# Patient Record
Sex: Male | Born: 1935
Health system: Southern US, Community
[De-identification: ages and names within clinical notes are randomized; demographics above are authoritative.]

## PROBLEM LIST (undated history)

## (undated) DIAGNOSIS — I10 Essential (primary) hypertension: Secondary | ICD-10-CM

## (undated) DIAGNOSIS — F419 Anxiety disorder, unspecified: Secondary | ICD-10-CM

## (undated) DIAGNOSIS — E669 Obesity, unspecified: Secondary | ICD-10-CM

## (undated) DIAGNOSIS — C801 Malignant (primary) neoplasm, unspecified: Secondary | ICD-10-CM

## (undated) DIAGNOSIS — E785 Hyperlipidemia, unspecified: Secondary | ICD-10-CM

## (undated) DIAGNOSIS — R12 Heartburn: Secondary | ICD-10-CM

## (undated) DIAGNOSIS — N189 Chronic kidney disease, unspecified: Secondary | ICD-10-CM

## (undated) DIAGNOSIS — M1990 Unspecified osteoarthritis, unspecified site: Secondary | ICD-10-CM

## (undated) DIAGNOSIS — I639 Cerebral infarction, unspecified: Secondary | ICD-10-CM

## (undated) DIAGNOSIS — K219 Gastro-esophageal reflux disease without esophagitis: Secondary | ICD-10-CM

## (undated) DIAGNOSIS — K635 Polyp of colon: Secondary | ICD-10-CM

## (undated) DIAGNOSIS — E119 Type 2 diabetes mellitus without complications: Secondary | ICD-10-CM

## (undated) HISTORY — DX: Polyp of colon: K63.5

## (undated) HISTORY — DX: Obesity, unspecified: E66.9

## (undated) HISTORY — DX: Unspecified osteoarthritis, unspecified site: M19.90

## (undated) HISTORY — DX: Heartburn: R12

## (undated) HISTORY — DX: Hyperlipidemia, unspecified: E78.5

## (undated) HISTORY — PX: EYE SURGERY: SHX253

---

## 2004-05-14 ENCOUNTER — Emergency Department: Payer: Self-pay | Admitting: Emergency Medicine

## 2012-10-06 ENCOUNTER — Emergency Department: Payer: Self-pay | Admitting: Emergency Medicine

## 2014-06-26 ENCOUNTER — Emergency Department
Admission: EM | Admit: 2014-06-26 | Discharge: 2014-06-26 | Discharge status: Against Medical Advice | Payer: Medicare Other | Attending: Emergency Medicine | Admitting: Emergency Medicine

## 2014-06-26 ENCOUNTER — Encounter: Payer: Self-pay | Admitting: Emergency Medicine

## 2014-06-26 ENCOUNTER — Telehealth: Payer: Self-pay | Admitting: Emergency Medicine

## 2014-06-26 ENCOUNTER — Other Ambulatory Visit: Payer: Self-pay

## 2014-06-26 DIAGNOSIS — I10 Essential (primary) hypertension: Secondary | ICD-10-CM | POA: Insufficient documentation

## 2014-06-26 DIAGNOSIS — Z87891 Personal history of nicotine dependence: Secondary | ICD-10-CM | POA: Diagnosis not present

## 2014-06-26 DIAGNOSIS — E119 Type 2 diabetes mellitus without complications: Secondary | ICD-10-CM | POA: Diagnosis not present

## 2014-06-26 DIAGNOSIS — R0789 Other chest pain: Secondary | ICD-10-CM | POA: Diagnosis present

## 2014-06-26 HISTORY — DX: Essential (primary) hypertension: I10

## 2014-06-26 HISTORY — DX: Type 2 diabetes mellitus without complications: E11.9

## 2014-06-26 LAB — BASIC METABOLIC PANEL
Anion gap: 8 (ref 5–15)
BUN: 22 mg/dL — ABNORMAL HIGH (ref 6–20)
CO2: 25 mmol/L (ref 22–32)
Calcium: 9.3 mg/dL (ref 8.9–10.3)
Chloride: 104 mmol/L (ref 101–111)
Creatinine, Ser: 1.45 mg/dL — ABNORMAL HIGH (ref 0.61–1.24)
GFR calc Af Amer: 50 mL/min — ABNORMAL LOW (ref >60)
GFR calc non Af Amer: 43 mL/min — ABNORMAL LOW (ref >60)
Glucose, Bld: 291 mg/dL — ABNORMAL HIGH (ref 65–99)
Potassium: 4.7 mmol/L (ref 3.5–5.1)
Sodium: 137 mmol/L (ref 135–145)

## 2014-06-26 LAB — TROPONIN I: Troponin I: <0.03 ng/mL (ref <0.031)

## 2014-06-26 NOTE — ED Notes (Signed)
Called due to lwobs today.  Pt says he thought his blood sugar was down and went home to eat.  He also says he thinks the pain may be due to pulled muscle, as he was helpi9ng move furniture a couple days ago.  [facility]  i asked him to call his pcp and tell them he came to the ed with chest pain and the circumstances.

## 2014-06-26 NOTE — ED Notes (Signed)
Says stabbing left c hest pain that increases with deep breath for 2-3 days.

## 2015-06-08 LAB — CBC AND DIFFERENTIAL: Hemoglobin: 8 g/dL — AB (ref 13.5–17.5)

## 2015-07-03 ENCOUNTER — Encounter: Payer: Self-pay | Admitting: Family Medicine

## 2015-07-03 DIAGNOSIS — E1140 Type 2 diabetes mellitus with diabetic neuropathy, unspecified: Secondary | ICD-10-CM | POA: Insufficient documentation

## 2015-07-03 DIAGNOSIS — I129 Hypertensive chronic kidney disease with stage 1 through stage 4 chronic kidney disease, or unspecified chronic kidney disease: Secondary | ICD-10-CM | POA: Insufficient documentation

## 2015-07-03 DIAGNOSIS — I10 Essential (primary) hypertension: Secondary | ICD-10-CM | POA: Insufficient documentation

## 2015-07-03 DIAGNOSIS — K635 Polyp of colon: Secondary | ICD-10-CM | POA: Insufficient documentation

## 2015-07-03 DIAGNOSIS — N183 Chronic kidney disease, stage 3 unspecified: Secondary | ICD-10-CM | POA: Insufficient documentation

## 2015-07-03 DIAGNOSIS — M1990 Unspecified osteoarthritis, unspecified site: Secondary | ICD-10-CM | POA: Insufficient documentation

## 2015-07-03 DIAGNOSIS — E7800 Pure hypercholesterolemia, unspecified: Secondary | ICD-10-CM

## 2015-07-03 DIAGNOSIS — E138 Other specified diabetes mellitus with unspecified complications: Secondary | ICD-10-CM

## 2015-07-03 DIAGNOSIS — R12 Heartburn: Secondary | ICD-10-CM

## 2015-07-04 ENCOUNTER — Encounter: Payer: Self-pay | Admitting: Family Medicine

## 2015-07-04 ENCOUNTER — Ambulatory Visit (INDEPENDENT_AMBULATORY_CARE_PROVIDER_SITE_OTHER): Payer: Medicare Other | Admitting: Family Medicine

## 2015-07-04 VITALS — BP 138/64 | HR 78 | Temp 98.4°F | Resp 16 | Ht 69.5 in | Wt 188.0 lb

## 2015-07-04 DIAGNOSIS — Z794 Long term (current) use of insulin: Secondary | ICD-10-CM

## 2015-07-04 DIAGNOSIS — R12 Heartburn: Secondary | ICD-10-CM | POA: Diagnosis not present

## 2015-07-04 DIAGNOSIS — E1122 Type 2 diabetes mellitus with diabetic chronic kidney disease: Secondary | ICD-10-CM

## 2015-07-04 DIAGNOSIS — N182 Chronic kidney disease, stage 2 (mild): Secondary | ICD-10-CM

## 2015-07-04 DIAGNOSIS — R1011 Right upper quadrant pain: Secondary | ICD-10-CM | POA: Diagnosis not present

## 2015-07-04 DIAGNOSIS — I10 Essential (primary) hypertension: Secondary | ICD-10-CM | POA: Diagnosis not present

## 2015-07-04 MED ORDER — OMEPRAZOLE 20 MG PO CPDR: 20.0000 mg | DELAYED_RELEASE_CAPSULE | Freq: Every day | ORAL | Status: DC

## 2015-07-04 NOTE — Assessment & Plan Note (Signed)
Symptoms consistent with GERD vs gallbladder. Add omeprazole. Check CBC, CMET. Recheck 4 weeks.

## 2015-07-04 NOTE — Progress Notes (Signed)
Subjective:    Patient ID: Charles York, male    DOB: 10-26-1930, 82 y.o.   MRN: [id number]  HPI: Charles York is a 82 y.o. male presenting on 07/04/2015 for Establish Care   HPI  Pt presents to establish care today. Pt presents to establish care today. [facility] he would like a local provider for acute visits.  It has been 3 months since His last PCP visit. Records from previous provider will be requested and reviewed. Current medical problems include:  Diabetes: Managed by [place]. Last A1c was 8.0%. Walks 3 miles every morning. Drinks lots of diet stuff. Lantus dose is 28 units before breakfasts. Taking 12 before meals with Novolog twice daily. [facility] Foot exam done 2 weeks ago.  Hypertension: Diagnosed 5 years ago. Controlled. Taking losartan daily. Managed at [facility]. Checks BP at home. Calls in numbers at 7pm to [place]. Acid reflux- Taking ranitidine daily. Twice daily. Still having gas and bloating. No dysphagia or regurg. Some belching. Anything he eats causes gas. Never had endoscopy.  Has never been a PPI.  Health maintenance:  Getting colonoscopy 6/9  Concern about R side pain- 2 weeks reaching in kitchen sharp pain. Now dull ache on R side radiates to back. Pain is worse with meals. Some nausea. No fevers. Colicky pain. Certain movements cause more pain.   Widowed- wife passed away last year.  Past Medical History  Diagnosis Date  . Diabetes mellitus without complication ([place])   . Hypertension   . Arthritis   . Colon polyp   . Heartburn   . Hyperlipemia   . Obesity   History reviewed. No pertinent past surgical history.  Social History   Social History  . Marital Status: Married    Spouse Name: N/A  . Number of Children: N/A  . Years of Education: N/A   Occupational History  . Not on file.   Social History Main Topics  . Smoking status: Former Smoker    Quit date: 02/04/1983  . Smokeless tobacco: Not on file  . Alcohol Use:  No  . Drug Use: No  . Sexual Activity: Not on file   Other Topics Concern  . Not on file   Social History Narrative   Family History  Problem Relation Age of Onset  . Cancer Father     carcinoma  . Alzheimer's disease Mother    No current outpatient prescriptions on file prior to visit.   No current facility-administered medications on file prior to visit.    Review of Systems  Constitutional: Negative for fever and chills.  HENT: Negative.   Respiratory: Negative for chest tightness, shortness of breath and wheezing.   Cardiovascular: Negative for chest pain, palpitations and leg swelling.  Gastrointestinal: Positive for nausea, abdominal pain and abdominal distention. Negative for vomiting, constipation, blood in stool and rectal pain.  Endocrine: Negative.   Genitourinary: Negative for dysuria, urgency, discharge, penile pain and testicular pain.  Musculoskeletal: Negative for back pain, joint swelling and arthralgias.  Skin: Negative.   Neurological: Negative for dizziness, weakness, numbness and headaches.  Psychiatric/Behavioral: Negative for sleep disturbance and dysphoric mood.   Per HPI unless specifically indicated above     Objective:    BP 138/64 mmHg  Pulse 78  Temp(Src) 98.4 F (36.9 C) (Oral)  Resp 16  Ht 5' 9.5" (1.765 m)  Wt 188 lb (85.276 kg)  BMI 27.37 kg/m2  Wt Readings from Last  3 Encounters:  07/04/15 188 lb (85.276 kg)  06/26/14 190 lb (86.183 kg)    Physical Exam  Constitutional: He is oriented to person, place, and time. He appears well-developed and well-nourished. No distress.  HENT:  Head: Normocephalic and atraumatic.  Neck: Neck supple. No thyromegaly present.  Cardiovascular: Normal rate, regular rhythm and normal heart sounds.  Exam reveals no gallop and no friction rub.   No murmur heard. Pulmonary/Chest: Effort normal and breath sounds normal. He has no wheezes.  Abdominal: Soft. Normal appearance. He exhibits no shifting  dullness, no distension, no pulsatile liver, no fluid wave and no abdominal bruit. Bowel sounds are increased. There is no hepatosplenomegaly. There is tenderness in the right upper quadrant. There is positive Murphy's sign. There is no rigidity, no rebound, no guarding, no CVA tenderness and no tenderness at McBurney's point.  Musculoskeletal: Normal range of motion. He exhibits no edema or tenderness.  Neurological: He is alert and oriented to person, place, and time. He has normal reflexes.  Skin: Skin is warm and dry. No rash noted. No erythema.  Psychiatric: He has a normal mood and affect. His behavior is normal. Thought content normal.   Diabetic Foot Exam - Simple   Simple Foot Form  Diabetic Foot exam was performed with the following findings:  Yes 07/04/2015  9:40 AM  Visual Inspection  No deformities, no ulcerations, no other skin breakdown bilaterally:  Yes  Sensation Testing  Intact to touch and monofilament testing bilaterally:  Yes  Pulse Check  Posterior Tibialis and Dorsalis pulse intact bilaterally:  Yes  Comments      Results for orders placed or performed in visit on 07/04/15  CBC and differential  Result Value Ref Range   Hemoglobin 8.0 (A) 13.5 - 17.5 g/dL      Assessment & Plan:   Problem List Items Addressed This Visit      Cardiovascular and Mediastinum   High blood pressure    Managed by the [place]. Controlled in office.       Relevant Medications   losartan (COZAAR) 25 MG tablet   atorvastatin (LIPITOR) 80 MG tablet   aspirin 81 MG tablet     Endocrine   Diabetes ([place])    Managed by the [facility].       Relevant Medications   losartan (COZAAR) 25 MG tablet   atorvastatin (LIPITOR) 80 MG tablet   aspirin 81 MG tablet   insulin aspart (NOVOLOG) 100 UNIT/ML injection   insulin glargine (LANTUS) 100 UNIT/ML injection     Other   Heartburn    Symptoms consistent with GERD vs gallbladder. Add omeprazole. Check CBC, CMET. Recheck 4 weeks.       Relevant  Medications   omeprazole (PRILOSEC) 20 MG capsule   Other Relevant Orders   CBC with Differential/Platelet    Other Visit Diagnoses    Colicky RUQ abdominal pain    -  Primary    Concern for gallbladder disease. Check CMET, amylase, lipase. RUQ ultrasound ordered. Pt to ER for severe abdominal pain.     Relevant Orders    Amylase    Lipase    Comprehensive metabolic panel    US Abdomen Limited RUQ       Meds ordered this encounter  Medications  . losartan (COZAAR) 25 MG tablet    Sig: Take 25 mg by mouth daily.  Marland Kitchen atorvastatin (LIPITOR) 80 MG tablet    Sig: Take 80 mg by mouth at bedtime. Pt take  1/2 pill daily at bed time  . ranitidine (ZANTAC) 150 MG tablet    Sig: Take 150 mg by mouth 2 (two) times daily.  Marland Kitchen aspirin 81 MG tablet    Sig: Take 81 mg by mouth daily.  . vitamin B-12 (CYANOCOBALAMIN) 1000 MCG tablet    Sig: Take 1,000 mcg by mouth daily.  . insulin aspart (NOVOLOG) 100 UNIT/ML injection    Sig: Inject into the skin 3 (three) times daily before meals. Pt takes 12 units before breakfast and before supper.  . insulin glargine (LANTUS) 100 UNIT/ML injection    Sig: Inject into the skin daily. Pt takes 28 units once daily  . omeprazole (PRILOSEC) 20 MG capsule    Sig: Take 1 capsule (20 mg total) by mouth daily.    Dispense:  30 capsule    Refill:  3    Order Specific Question:  Supervising Provider    Answer:  Arlis Porta [id number]      Follow up plan: Return in about 4 weeks (around 08/01/2015) for belly pain.

## 2015-07-04 NOTE — Patient Instructions (Signed)
I think your pain could be your gallbladder. We will check some lab work to ensure everything is going okay. We will also get an ultrasound to see if there are gallstones.   Start taking omeprazole once daily in the morning on an empty stomach.

## 2015-07-04 NOTE — Assessment & Plan Note (Signed)
[facility] 

## 2015-07-04 NOTE — Assessment & Plan Note (Addendum)
Managed by the [place]. Controlled in office.

## 2015-07-05 ENCOUNTER — Other Ambulatory Visit: Payer: Self-pay | Admitting: Family Medicine

## 2015-07-05 ENCOUNTER — Other Ambulatory Visit: Payer: Medicare Other

## 2015-07-05 DIAGNOSIS — R1011 Right upper quadrant pain: Secondary | ICD-10-CM | POA: Diagnosis not present

## 2015-07-05 DIAGNOSIS — R12 Heartburn: Secondary | ICD-10-CM | POA: Diagnosis not present

## 2015-07-05 LAB — CBC WITH DIFFERENTIAL/PLATELET
Basophils Absolute: 87 cells/uL (ref 0–200)
Basophils Relative: 1 %
Eosinophils Absolute: 261 cells/uL (ref 15–500)
Eosinophils Relative: 3 %
HCT: 39.9 % (ref 38.5–50.0)
Hemoglobin: 13.4 g/dL (ref 13.2–17.1)
Lymphocytes Relative: 33 %
Lymphs Abs: 2871 cells/uL (ref 850–3900)
MCH: 32.7 pg (ref 27.0–33.0)
MCHC: 33.6 g/dL (ref 32.0–36.0)
MCV: 97.3 fL (ref 80.0–100.0)
MPV: 10.2 fL (ref 7.5–12.5)
Monocytes Absolute: 696 cells/uL (ref 200–950)
Monocytes Relative: 8 %
Neutro Abs: 4785 cells/uL (ref 1500–7800)
Neutrophils Relative %: 55 %
Platelets: 226 10*3/uL (ref 140–400)
RBC: 4.1 MIL/uL — ABNORMAL LOW (ref 4.20–5.80)
RDW: 13 % (ref 11.0–15.0)
WBC: 8.7 10*3/uL (ref 3.8–10.8)

## 2015-07-06 ENCOUNTER — Other Ambulatory Visit: Payer: Self-pay | Admitting: Family Medicine

## 2015-07-06 DIAGNOSIS — E875 Hyperkalemia: Secondary | ICD-10-CM

## 2015-07-06 LAB — LIPASE: Lipase: 72 U/L — ABNORMAL HIGH (ref 7–60)

## 2015-07-06 LAB — COMPREHENSIVE METABOLIC PANEL
ALT: 18 U/L (ref 9–46)
AST: 11 U/L (ref 10–35)
Albumin: 3.9 g/dL (ref 3.6–5.1)
Alkaline Phosphatase: 76 U/L (ref 40–115)
BUN: 43 mg/dL — ABNORMAL HIGH (ref 7–25)
CO2: 23 mmol/L (ref 20–31)
Calcium: 9 mg/dL (ref 8.6–10.3)
Chloride: 103 mmol/L (ref 98–110)
Creat: 1.65 mg/dL — ABNORMAL HIGH (ref 0.70–1.11)
Glucose, Bld: 202 mg/dL — ABNORMAL HIGH (ref 65–99)
Potassium: 5.5 mmol/L — ABNORMAL HIGH (ref 3.5–5.3)
Sodium: 135 mmol/L (ref 135–146)
Total Bilirubin: 0.5 mg/dL (ref 0.2–1.2)
Total Protein: 6.7 g/dL (ref 6.1–8.1)

## 2015-07-06 LAB — AMYLASE: Amylase: 110 U/L — ABNORMAL HIGH (ref 0–105)

## 2015-07-06 MED ORDER — FUROSEMIDE 20 MG PO TABS: 20.0000 mg | ORAL_TABLET | Freq: Every day | ORAL | Status: DC

## 2015-07-10 ENCOUNTER — Telehealth: Payer: Self-pay | Admitting: Surgery

## 2015-07-10 ENCOUNTER — Other Ambulatory Visit: Payer: Self-pay | Admitting: Family Medicine

## 2015-07-10 ENCOUNTER — Telehealth: Payer: Self-pay | Admitting: Family Medicine

## 2015-07-10 ENCOUNTER — Ambulatory Visit
Admission: RE | Admit: 2015-07-10 | Discharge: 2015-07-10 | Disposition: A | Discharge status: Home / Self Care | Payer: Medicare Other | Source: Ambulatory Visit | Attending: Family Medicine | Admitting: Family Medicine

## 2015-07-10 DIAGNOSIS — R1011 Right upper quadrant pain: Secondary | ICD-10-CM | POA: Insufficient documentation

## 2015-07-10 DIAGNOSIS — R748 Abnormal levels of other serum enzymes: Secondary | ICD-10-CM

## 2015-07-10 NOTE — Telephone Encounter (Signed)
Reviewed [place] with patient. He is having no pain right now. His pancreatic enzymes were elevated- we will recheck next week. If still elevated with continue with surgical referral. If not, will cancel the appt. Thanks! AK

## 2015-07-10 NOTE — Telephone Encounter (Signed)
Charles York, patient would like to speak with you about his referral and appointment on 6/19 to see Dr Burt Knack. He does not want to keep the appointment. I attempted to cancel the appointment, but he told me to leave it and wait until he speaks with you. Please call at your convenience. Thanks

## 2015-07-11 NOTE — Telephone Encounter (Signed)
Patient's appointment has been moved to see Dr Allen Norris in Gastroenterology per PCP and Patient's request.

## 2015-07-12 ENCOUNTER — Other Ambulatory Visit: Payer: Self-pay | Admitting: Family Medicine

## 2015-07-12 DIAGNOSIS — R748 Abnormal levels of other serum enzymes: Secondary | ICD-10-CM

## 2015-07-16 ENCOUNTER — Other Ambulatory Visit: Payer: Medicare Other

## 2015-07-16 ENCOUNTER — Other Ambulatory Visit: Payer: Self-pay | Admitting: *Deleted

## 2015-07-16 DIAGNOSIS — R748 Abnormal levels of other serum enzymes: Secondary | ICD-10-CM | POA: Diagnosis not present

## 2015-07-17 LAB — COMPREHENSIVE METABOLIC PANEL
ALT: 15 U/L (ref 9–46)
AST: 12 U/L (ref 10–35)
Albumin: 3.6 g/dL (ref 3.6–5.1)
Alkaline Phosphatase: 76 U/L (ref 40–115)
BUN: 25 mg/dL (ref 7–25)
CO2: 22 mmol/L (ref 20–31)
Calcium: 8.8 mg/dL (ref 8.6–10.3)
Chloride: 107 mmol/L (ref 98–110)
Creat: 1.57 mg/dL — ABNORMAL HIGH (ref 0.70–1.11)
Glucose, Bld: 107 mg/dL — ABNORMAL HIGH (ref 65–99)
Potassium: 4.7 mmol/L (ref 3.5–5.3)
Sodium: 139 mmol/L (ref 135–146)
Total Bilirubin: 0.4 mg/dL (ref 0.2–1.2)
Total Protein: 6.2 g/dL (ref 6.1–8.1)

## 2015-07-17 LAB — AMYLASE: Amylase: 110 U/L — ABNORMAL HIGH (ref 0–105)

## 2015-07-17 LAB — LIPASE: Lipase: 85 U/L — ABNORMAL HIGH (ref 7–60)

## 2015-07-19 ENCOUNTER — Telehealth: Payer: Self-pay | Admitting: Family Medicine

## 2015-07-19 NOTE — Telephone Encounter (Signed)
-----   Message from Devona Konig, [place] sent at 07/17/2015 11:26 AM EDT ----- Patient aware of results. He wanted to remind that he is a diabetic and on insulin. He is feeling much after starting omeprazole.

## 2015-07-23 ENCOUNTER — Ambulatory Visit: Payer: Medicare Other | Admitting: Surgery

## 2015-08-06 ENCOUNTER — Ambulatory Visit (INDEPENDENT_AMBULATORY_CARE_PROVIDER_SITE_OTHER): Payer: Medicare Other | Admitting: Family Medicine

## 2015-08-06 VITALS — BP 134/68 | HR 85 | Temp 98.1°F | Resp 16 | Ht 69.5 in | Wt 192.0 lb

## 2015-08-06 DIAGNOSIS — R748 Abnormal levels of other serum enzymes: Secondary | ICD-10-CM

## 2015-08-06 DIAGNOSIS — R12 Heartburn: Secondary | ICD-10-CM | POA: Diagnosis not present

## 2015-08-06 MED ORDER — OMEPRAZOLE 20 MG PO CPDR: 20.0000 mg | DELAYED_RELEASE_CAPSULE | Freq: Every day | ORAL | Status: DC

## 2015-08-06 NOTE — Progress Notes (Signed)
Subjective:    Patient ID: Charles York, male    DOB: [birth year], 82 y.o.   MRN: [id number]  HPI: Charles York is a 82 y.o. male presenting on 08/06/2015 for Abdominal Pain   HPI  Pt presents for follow-up of belly pain. Belly pain has resolved. Symptoms have resolved since starting prilosec. No nausea/vomiting or belly pain.   Past Medical History  Diagnosis Date  . Diabetes mellitus without complication ([place])   . Hypertension   . Arthritis   . Colon polyp   . Heartburn   . Hyperlipemia   . Obesity     Current Outpatient Prescriptions on File Prior to Visit  Medication Sig  . aspirin 81 MG tablet Take 81 mg by mouth daily.  Marland Kitchen atorvastatin (LIPITOR) 80 MG tablet Take 80 mg by mouth at bedtime. Pt take 1/2 pill daily at bed time  . furosemide (LASIX) 20 MG tablet Take 1 tablet (20 mg total) by mouth daily. For 3 days.  . insulin aspart (NOVOLOG) 100 UNIT/ML injection Inject into the skin 3 (three) times daily before meals. Pt takes 12 units before breakfast and before supper.  . insulin glargine (LANTUS) 100 UNIT/ML injection Inject into the skin daily. Pt takes 28 units once daily  . losartan (COZAAR) 25 MG tablet Take 25 mg by mouth daily.  . ranitidine (ZANTAC) 150 MG tablet Take 150 mg by mouth 2 (two) times daily.  . vitamin B-12 (CYANOCOBALAMIN) 1000 MCG tablet Take 1,000 mcg by mouth daily.   No current facility-administered medications on file prior to visit.    Review of Systems  Constitutional: Negative for fever and chills.  HENT: Negative.   Respiratory: Negative for chest tightness, shortness of breath and wheezing.   Cardiovascular: Negative for chest pain, palpitations and leg swelling.  Gastrointestinal: Negative for nausea, vomiting and abdominal pain.  Endocrine: Negative.   Genitourinary: Negative for dysuria, urgency, discharge, penile pain and testicular pain.  Musculoskeletal: Negative for back pain, joint swelling and arthralgias.  Skin: Negative.     Neurological: Negative for dizziness, weakness, numbness and headaches.  Psychiatric/Behavioral: Negative for sleep disturbance and dysphoric mood.   Per HPI unless specifically indicated above     Objective:    BP 134/68 mmHg  Pulse 85  Temp(Src) 98.1 F (36.7 C) (Oral)  Resp 16  Ht 5' 9.5" (1.765 m)  Wt 192 lb (87.091 kg)  BMI 27.96 kg/m2  Wt Readings from Last 3 Encounters:  08/06/15 192 lb (87.091 kg)  07/04/15 188 lb (85.276 kg)  06/26/14 190 lb (86.183 kg)    Physical Exam  Constitutional: He is oriented to person, place, and time. He appears well-developed and well-nourished. No distress.  HENT:  Head: Normocephalic and atraumatic.  Neck: Neck supple. No thyromegaly present.  Cardiovascular: Normal rate, regular rhythm and normal heart sounds.  Exam reveals no gallop and no friction rub.   No murmur heard. Pulmonary/Chest: Effort normal and breath sounds normal. He has no wheezes.  Abdominal: Soft. Bowel sounds are normal. He exhibits no distension. There is no hepatosplenomegaly. There is tenderness (mild- pt described as sore. ) in the epigastric area. There is no rebound and no CVA tenderness.  Musculoskeletal: Normal range of motion. He exhibits no edema or tenderness.  Neurological: He is alert and oriented to person, place, and time. He has normal reflexes.  Skin: Skin is warm and dry. No rash noted. No erythema.  Psychiatric: He has a normal mood and affect. His  behavior is normal. Thought content normal.   Results for orders placed or performed in visit on 07/16/15  Amylase  Result Value Ref Range   Amylase 110 (H) 0 - 105 U/L  Lipase  Result Value Ref Range   Lipase 85 (H) 7 - 60 U/L  Comprehensive metabolic panel  Result Value Ref Range   Sodium 139 135 - 146 mmol/L   Potassium 4.7 3.5 - 5.3 mmol/L   Chloride 107 98 - 110 mmol/L   CO2 22 20 - 31 mmol/L   Glucose, Bld 107 (H) 65 - 99 mg/dL   BUN 25 7 - 25 mg/dL   Creat 1.57 (H) 0.70 - 1.11 mg/dL    Total Bilirubin 0.4 0.2 - 1.2 mg/dL   Alkaline Phosphatase 76 40 - 115 U/L   AST 12 10 - 35 U/L   ALT 15 9 - 46 U/L   Total Protein 6.2 6.1 - 8.1 g/dL   Albumin 3.6 3.6 - 5.1 g/dL   Calcium 8.8 8.6 - 10.3 mg/dL      Assessment & Plan:   Problem List Items Addressed This Visit      Other   Heartburn - Primary    GERD symptoms much improved with addition of omeprazole. Continue PPI to help with symptoms. Pt has appt with GI on 7/11.       Relevant Medications   omeprazole (PRILOSEC) 20 MG capsule    Other Visit Diagnoses    Elevated pancreatic enzyme        Mild elevation. No abdominal pain. No nausea. Pt has appt with GI for evaluation on 7/11. Alarm symptoms reviewed.        Meds ordered this encounter  Medications  . omeprazole (PRILOSEC) 20 MG capsule    Sig: Take 1 capsule (20 mg total) by mouth daily.    Dispense:  30 capsule    Refill:  11    Order Specific Question:  Supervising Provider    Answer:  Arlis Porta [phone]      Follow up plan: Return if symptoms worsen or fail to improve.

## 2015-08-06 NOTE — Patient Instructions (Signed)
Continue to take omeprazole once daily to help with your symptoms. Because your pancreatic enzymes are still elevated- I would like you to see a GI specialist- Dr. Allen Norris. If you have severe belly pain, nausea, or vomiting, please go to the ER.

## 2015-08-06 NOTE — Assessment & Plan Note (Signed)
GERD symptoms much improved with addition of omeprazole. Continue PPI to help with symptoms. Pt has appt with GI on 7/11.

## 2015-08-14 ENCOUNTER — Ambulatory Visit: Payer: Medicare Other | Admitting: Gastroenterology

## 2015-09-12 ENCOUNTER — Ambulatory Visit: Payer: Medicare Other | Admitting: Gastroenterology

## 2015-09-25 ENCOUNTER — Ambulatory Visit: Payer: Medicare Other | Admitting: Family Medicine

## 2015-10-11 ENCOUNTER — Ambulatory Visit: Payer: Medicare Other | Admitting: Gastroenterology

## 2015-10-11 ENCOUNTER — Ambulatory Visit (INDEPENDENT_AMBULATORY_CARE_PROVIDER_SITE_OTHER): Payer: Medicare Other | Admitting: Gastroenterology

## 2015-10-11 VITALS — BP 147/74 | HR 70 | Temp 97.9°F | Ht 70.0 in | Wt 195.0 lb

## 2015-10-11 DIAGNOSIS — K219 Gastro-esophageal reflux disease without esophagitis: Secondary | ICD-10-CM | POA: Diagnosis not present

## 2015-10-11 DIAGNOSIS — R748 Abnormal levels of other serum enzymes: Secondary | ICD-10-CM

## 2015-10-12 ENCOUNTER — Other Ambulatory Visit: Payer: Self-pay

## 2015-10-12 ENCOUNTER — Telehealth: Payer: Self-pay

## 2015-10-12 DIAGNOSIS — R12 Heartburn: Secondary | ICD-10-CM

## 2015-10-12 LAB — AMYLASE: Amylase: 113 U/L (ref 31–124)

## 2015-10-12 LAB — LIPASE: Lipase: 55 U/L (ref 0–59)

## 2015-10-12 MED ORDER — DEXLANSOPRAZOLE 60 MG PO CPDR: 60.0000 mg | DELAYED_RELEASE_CAPSULE | Freq: Every day | ORAL | 11 refills | Status: DC

## 2015-10-12 NOTE — Telephone Encounter (Signed)
-----   Message from Lucilla Lame, MD sent at 10/12/2015  8:05 AM EDT ----- Let the patient know the labs are back to normal.

## 2015-10-12 NOTE — Telephone Encounter (Signed)
Pt notified of lab results. Pt stated Dexilant medication worked great. Requested a rx to be sent to [facility]. Rx sent.

## 2015-10-12 NOTE — Progress Notes (Signed)
Gastroenterology Consultation  Referring Provider:     Luciana Axe, NP Primary Care Physician:  Leata Mouse, NP Primary Gastroenterologist:  Dr. Allen Norris     Reason for Consultation:     Abdominal pain        HPI:   Charles York is a 82 y.o. y/o male referred for consultation & management of Abdominal pain by Dr. Leata Mouse, NP.  This patient comes in today with a report of abdominal pain that he states occurs only when he's laying down at night.The patient states that it is not all the time but usually does not bother him throughout the day but occurs when he is laying down motionless at night.  The patient reports it as a squeezing gnawing pain  without any sharp pains or burning. He was also found to have a slightly elevated lipase.  There is no report of any fevers chills nausea or vomiting.  He also denies any alcohol abuse. He thinks that the pain may be associated with his eating because he reports that he eats right before going to bed.  Past Medical History:  Diagnosis Date  . Arthritis   . Colon polyp   . Diabetes mellitus without complication ([place])   . Heartburn   . Hyperlipemia   . Hypertension   . Obesity     Past Surgical History:  Procedure Laterality Date  . No surgical hx      Prior to Admission medications   Medication Sig Start Date End Date Taking? Authorizing Provider  aspirin 81 MG tablet Take 81 mg by mouth daily.   Yes Historical Provider, MD  atorvastatin (LIPITOR) 80 MG tablet Take 80 mg by mouth at bedtime. Pt take 1/2 pill daily at bed time   Yes Historical Provider, MD  insulin aspart (NOVOLOG) 100 UNIT/ML injection Inject into the skin 3 (three) times daily before meals. Pt takes 12 units before breakfast and before supper.   Yes Historical Provider, MD  insulin glargine (LANTUS) 100 UNIT/ML injection Inject into the skin daily. Pt takes 28 units once daily   Yes Historical Provider, MD  omeprazole (PRILOSEC) 20 MG capsule Take 1 capsule (20 mg  total) by mouth daily. 08/06/15  Yes Amy Overton Mam, NP  ranitidine (ZANTAC) 150 MG tablet Take 150 mg by mouth 2 (two) times daily.   Yes Historical Provider, MD  vitamin B-12 (CYANOCOBALAMIN) 1000 MCG tablet Take 1,000 mcg by mouth daily.   Yes Historical Provider, MD  dexlansoprazole (DEXILANT) 60 MG capsule Take 1 capsule (60 mg total) by mouth daily. 10/12/15   Lucilla Lame, MD  furosemide (LASIX) 20 MG tablet Take 1 tablet (20 mg total) by mouth daily. For 3 days. Patient not taking: Reported on 10/11/2015 07/06/15   Amy Overton Mam, NP  losartan (COZAAR) 25 MG tablet Take 25 mg by mouth daily.    Historical Provider, MD    Family History  Problem Relation Age of Onset  . Cancer Father     carcinoma  . Alzheimer's disease Mother      Social History  Substance Use Topics  . Smoking status: Former Smoker    Quit date: 02/04/1983  . Smokeless tobacco: Never Used  . Alcohol use No    Allergies as of 10/11/2015 - Review Complete 10/11/2015  Allergen Reaction Noted  . Penicillins Rash 06/26/2014    Review of Systems:    All systems reviewed and negative except where noted in HPI.  Physical Exam:  BP (!) 147/74   Pulse 70   Temp 97.9 F (36.6 C) (Oral)   Ht 5\' 10"  (1.778 m)   Wt 195 lb (88.5 kg)   BMI 27.98 kg/m  No LMP for male patient. Psych:  Alert and cooperative. Normal mood and affect. General:   Alert,  Well-developed, well-nourished, pleasant and cooperative in NAD Head:  Normocephalic and atraumatic. Eyes:  Sclera clear, no icterus.   Conjunctiva pink. Ears:  Normal auditory acuity. Nose:  No deformity, discharge, or lesions. Mouth:  No deformity or lesions,oropharynx pink & moist. Neck:  Supple; no masses or thyromegaly. Lungs:  Respirations even and unlabored.  Clear throughout to auscultation.   No wheezes, crackles, or rhonchi. No acute distress. Heart:  Regular rate and rhythm; no murmurs, clicks, rubs, or gallops. Abdomen:  Normal bowel sounds.  No bruits.   Soft, non-tender and non-distended without masses, hepatosplenomegaly or hernias noted.  No guarding or rebound tenderness.  Negative Carnett sign.   Rectal:  Deferred.  Msk:  Symmetrical without gross deformities.  Good, equal movement & strength bilaterally. Pulses:  Normal pulses noted. Extremities:  No clubbing or edema.  No cyanosis. Neurologic:  Alert and oriented x3;  grossly normal neurologically. Skin:  Intact without significant lesions or rashes.  No jaundice. Lymph Nodes:  No significant cervical adenopathy. Psych:  Alert and cooperative. Normal mood and affect.  Imaging Studies: No results found.  Assessment and Plan:   Charles York is a 82 y.o. y/o male who comes in today with a report of chronic abdominal pain that has been present for the last few months.  The pain is only present when he has been laying down in bed and does not occur all the time. He reports that he has GERD symptoms in the morning and at night.  The patient has been told to not eat for 4 hours before he goes to sleep.  He has also been told to take his PPI in the evening instead of in the morning.  He will also have his lipase rechecked.   The patient has been explained the plan and agrees with it.   Note: This dictation was prepared with Dragon dictation along with smaller phrase technology. Any transcriptional errors that result from this process are unintentional.

## 2015-10-31 ENCOUNTER — Other Ambulatory Visit: Payer: Self-pay | Admitting: Family Medicine

## 2015-10-31 DIAGNOSIS — R12 Heartburn: Secondary | ICD-10-CM

## 2016-02-27 ENCOUNTER — Encounter: Payer: Self-pay | Admitting: Family Medicine

## 2016-02-27 ENCOUNTER — Ambulatory Visit (INDEPENDENT_AMBULATORY_CARE_PROVIDER_SITE_OTHER): Payer: Medicare Other | Admitting: Family Medicine

## 2016-02-27 VITALS — BP 133/66 | HR 75 | Temp 98.1°F | Resp 16 | Ht 70.0 in | Wt 193.0 lb

## 2016-02-27 DIAGNOSIS — Z8639 Personal history of other endocrine, nutritional and metabolic disease: Secondary | ICD-10-CM

## 2016-02-27 DIAGNOSIS — N183 Chronic kidney disease, stage 3 unspecified: Secondary | ICD-10-CM

## 2016-02-27 DIAGNOSIS — J324 Chronic pansinusitis: Secondary | ICD-10-CM

## 2016-02-27 DIAGNOSIS — H8112 Benign paroxysmal vertigo, left ear: Secondary | ICD-10-CM | POA: Diagnosis not present

## 2016-02-27 MED ORDER — FLUTICASONE PROPIONATE 50 MCG/ACT NA SUSP: 2.0000 | Freq: Every day | NASAL | 3 refills | Status: DC

## 2016-02-27 NOTE — Patient Instructions (Signed)
Thank you for coming in to clinic today.  1. You have symptoms of Vertigo (Benign Paroxysmal Positional Vertigo) - This is commonly caused by inner ear fluid imbalance, sometimes can be worsened by allergies and sinus symptoms, otherwise it can occur randomly sometimes and we may never discover the exact cause. - To treat this, try the Epley Manuever (see diagrams/instructions below) at home up to 3 times a day for 1-2 weeks or until symptoms resolve - You may take Meclizine as needed up to 3 times a day for dizziness, this will not cure symptoms but may help. Caution may make you drowsy.  Start Flonase 2 sprays in each nostril once daily for next 4-6 weeks to help reduce sinus inflammation If not improving with persistent sinus congestion or headache, sinus pressure or fever, then contact office and we can try antibiotic course to help clear potential deeper sinus infection.  You will be due for BLOOD WORK to check Kidney and Potassium  - Please go ahead and schedule a "Lab Only" visit in the morning at the clinic for lab draw tomorrow morning   If you develop significant worsening episode with vertigo that does not improve and you get severe headache, loss of vision, arm or leg weakness, slurred speech, or other concerning symptoms please seek immediate medical attention at Emergency Department.  Please schedule a follow-up appointment with Dr Parks Ranger within 4 weeks if Vertigo not improving, and will consider Referral to Vestibular Rehab  See the next page for images describing the [place].     ----------------------------------------------------------------------------------------------------------------------       If you have any other questions or concerns, please feel free to call the clinic or send a message through [place]. You may also schedule an earlier appointment if necessary.  Nobie Putnam, DO [place]

## 2016-02-27 NOTE — Assessment & Plan Note (Signed)
Consistent with subacute rhinosinusitis, likely initially viral URI vs allergic rhinitis component without concern for bacterial sinusitis at this time, however possible complication with chronic sinusitis affecting inner ear with vertigo symptoms  Plan: 1. Reassurance, likely self-limited - no indication for antibiotics at this time 2. Start Flonase 2 sprays in each nostril daily for next 4-6 weeks, then may stop and use seasonally or as needed 3. Check BMET in AM 4. Supportive care 5. Return criteria reviewed, consider future trial antibiotics if worsening concern for bacterial sinusitis

## 2016-02-27 NOTE — Assessment & Plan Note (Signed)
Suspected bilateral L>R BPPV by history supported by positive L Dix-Hallpike. Possibly secondary to subacute to chronic sinusitis - No other significant neurological findings or focal deficits  Plan: 1. Treat underlying likely non bacterial sinusitis with flonase 2. Handout given with Epley maneuver TID for 1-2 weeks until resolved 3. Consider OTC trial meclizine PRN for breakthrough symptoms, however cautioned side effects given age, advised only use if significant worsening and then likely need follow-up 4. Return criteria, if not improved consider referral ENT vs vestibular PT referral

## 2016-02-27 NOTE — Progress Notes (Signed)
Subjective:    Patient ID: Charles York, male    DOB: 1930/11/14, 83 y.o.   MRN: [id number]  Charles York is a 83 y.o. male presenting on 02/27/2016 for Dizziness (onset week feels weak sleepy all the time BS 148 and lowest 85 no HA or palpatation B/P ranges 132/70 but had some sinus infection --tylenol head and cold was taken by pt-OTC thinks may be a side effect from it.)   HPI   VERTIGO / DIZZINESS / DECREASED ENERGY: - Reports symptoms started about 1 week ago with decreased energy and fatigue, without generalized weakness, also with some dizziness episodes several times daily for past few days, worst when first stand up, but feels "off balance", does feel like room is spinning. He did check CBG today ranging from 85 to 148. Also he checks BP regularly at home with regular range 130s/70s - Normally followed by [facility] physicians, no regular specialist. Has DM and Hypertension, last A1c 7.8 (8 months ago) - Did have similar episode of decreased energy about 4 years ago similarly, diagnosed with low potassium - Recently had a "bad head cold" and seems like he has this constantly, tried taking Tylenol Cold & Flu medicine about 1 week ago - Admits some arthritis in shoulders with some radiating pain across front of chest occasionally for few years, without any changes - Some worsening exertional dyspnea due to feeling tired - No recent sick contacts - Admits some chronic hearing loss, without recent changes - Denies any dyspnea, numbness, weakness, tingling, nausea, vomiting, myalgias, syncope or pre-syncopal, fall or injury, headache   Social History  Substance Use Topics  . Smoking status: Former Smoker    Quit date: 02/04/1983  . Smokeless tobacco: Never Used  . Alcohol use No    Review of Systems Per HPI unless specifically indicated above     Objective:    BP 133/66   Pulse 75   Temp 98.1 F (36.7 C) (Oral)   Resp 16   Ht 5\' 10"  (1.778 m)   Wt 193 lb (87.5 kg)   BMI 27.69  kg/m   Wt Readings from Last 3 Encounters:  02/27/16 193 lb (87.5 kg)  10/11/15 195 lb (88.5 kg)  08/06/15 192 lb (87.1 kg)    Physical Exam  Constitutional: He is oriented to person, place, and time. He appears well-developed and well-nourished. No distress.  Elderly 83 year male, currently seems well-appearing, comfortable, cooperative  HENT:  Head: Normocephalic and atraumatic.  Mouth/Throat: Oropharynx is clear and moist.  Frontal / maxillary sinuses non-tender. Nares with some turbinate edema with deeper congestion without purulence. Bilateral TMs mild clear effusion with some fullness without erythema, bulging. Oropharynx clear without erythema, exudates, edema or asymmetry.  Dix-Hallpike maneuver performed with reproduction of vertigo symptoms bilaterally, with significantly increased severity of symptoms on Left provoked test, compared to Right. Possible mild subtle nystagmus on Left test without obvious eye movement on Right test.  Eyes: Conjunctivae are normal. Right eye exhibits no discharge. Left eye exhibits no discharge.  Neck: Normal range of motion. Neck supple. No thyromegaly present.  Cardiovascular: Normal rate, regular rhythm, normal heart sounds and intact distal pulses.   No murmur heard. Pulmonary/Chest: Breath sounds normal. No respiratory distress. He has no wheezes. He has no rales.  Musculoskeletal: Normal range of motion. He exhibits no edema.  Upper / Lower Extremities: - Normal muscle tone, strength bilateral upper extremities 5/5, lower extremities 5/5  Lymphadenopathy:    He has  no cervical adenopathy.  Neurological: He is alert and oriented to person, place, and time. No cranial nerve deficit. Coordination normal.  Distal sensation intact to light touch. Gait normal.  Skin: Skin is warm and dry. No rash noted. He is not diaphoretic. No erythema.  Psychiatric: His behavior is normal.  Nursing note and vitals reviewed.      Assessment & Plan:    Problem List Items Addressed This Visit    CKD (chronic kidney disease), stage III    CKD-III with elevated Cr at baseline 1.5-1.6, last check 07/2015, [facility]. Last K 4.7, has history of hyperkalemia with similar symptoms. - Hemodynamically stable, currently without complication.  Plan: 1. Check BMET in AM for follow-up K, given no lab draw in the afternoon today at this office, offered to go to [facility] today for labs patient declined.      Relevant Orders   BASIC METABOLIC PANEL WITH GFR   Chronic pansinusitis    Consistent with subacute rhinosinusitis, likely initially viral URI vs allergic rhinitis component without concern for bacterial sinusitis at this time, however possible complication with chronic sinusitis affecting inner ear with vertigo symptoms  Plan: 1. Reassurance, likely self-limited - no indication for antibiotics at this time 2. Start Flonase 2 sprays in each nostril daily for next 4-6 weeks, then may stop and use seasonally or as needed 3. Check BMET in AM 4. Supportive care 5. Return criteria reviewed, consider future trial antibiotics if worsening concern for bacterial sinusitis       Relevant Medications   fluticasone (FLONASE) 50 MCG/ACT nasal spray   BPPV (benign paroxysmal positional vertigo), left - Primary    Suspected bilateral L>R BPPV by history supported by positive L Dix-Hallpike. Possibly secondary to subacute to chronic sinusitis - No other significant neurological findings or focal deficits  Plan: 1. Treat underlying likely non bacterial sinusitis with flonase 2. Handout given with Epley maneuver TID for 1-2 weeks until resolved 3. Consider OTC trial meclizine PRN for breakthrough symptoms, however cautioned side effects given age, advised only use if significant worsening and then likely need follow-up 4. Return criteria, if not improved consider referral ENT vs vestibular PT referral       Other Visit  Diagnoses    History of hyperkalemia       Check BMET, follow-up K   Relevant Orders   BASIC METABOLIC PANEL WITH GFR      Meds ordered this encounter  Medications  . fluticasone (FLONASE) 50 MCG/ACT nasal spray    Sig: Place 2 sprays into both nostrils daily. Use for 4-6 weeks then stop and use seasonally or as needed.    Dispense:  16 g    Refill:  3      Follow up plan: Return in about 4 weeks (around 03/26/2016), or if symptoms worsen or fail to improve, for Vertigo.  Nobie Putnam, [facility] 02/27/2016, 9:29 PM

## 2016-02-27 NOTE — Assessment & Plan Note (Signed)
CKD-III with elevated Cr at baseline 1.5-1.6, last check 07/2015, [facility]. Last K 4.7, has history of hyperkalemia with similar symptoms. - Hemodynamically stable, currently without complication.  Plan: 1. Check BMET in AM for follow-up K, given no lab draw in the afternoon today at this office, offered to go to [facility] today for labs patient declined.

## 2016-02-28 ENCOUNTER — Other Ambulatory Visit: Payer: Medicare Other

## 2016-02-28 LAB — BASIC METABOLIC PANEL WITH GFR
BUN: 26 mg/dL — ABNORMAL HIGH (ref 7–25)
CO2: 26 mmol/L (ref 20–31)
Calcium: 9.2 mg/dL (ref 8.6–10.3)
Chloride: 102 mmol/L (ref 98–110)
Creat: 1.38 mg/dL — ABNORMAL HIGH (ref 0.70–1.11)
GFR, Est African American: 54 mL/min — ABNORMAL LOW (ref >=60)
GFR, Est Non African American: 47 mL/min — ABNORMAL LOW (ref >=60)
Glucose, Bld: 187 mg/dL — ABNORMAL HIGH (ref 65–99)
Potassium: 5.1 mmol/L (ref 3.5–5.3)
Sodium: 137 mmol/L (ref 135–146)

## 2016-06-09 ENCOUNTER — Ambulatory Visit (INDEPENDENT_AMBULATORY_CARE_PROVIDER_SITE_OTHER): Payer: Medicare Other | Admitting: Family Medicine

## 2016-06-09 ENCOUNTER — Encounter: Payer: Self-pay | Admitting: Family Medicine

## 2016-06-09 VITALS — BP 138/68 | HR 73 | Temp 97.9°F | Resp 16 | Ht 70.0 in | Wt 195.0 lb

## 2016-06-09 DIAGNOSIS — H6981 Other specified disorders of Eustachian tube, right ear: Secondary | ICD-10-CM | POA: Diagnosis not present

## 2016-06-09 DIAGNOSIS — H8112 Benign paroxysmal vertigo, left ear: Secondary | ICD-10-CM | POA: Diagnosis not present

## 2016-06-09 DIAGNOSIS — J324 Chronic pansinusitis: Secondary | ICD-10-CM | POA: Diagnosis not present

## 2016-06-09 DIAGNOSIS — J3089 Other allergic rhinitis: Secondary | ICD-10-CM | POA: Diagnosis not present

## 2016-06-09 DIAGNOSIS — J309 Allergic rhinitis, unspecified: Secondary | ICD-10-CM | POA: Insufficient documentation

## 2016-06-09 NOTE — Assessment & Plan Note (Signed)
Resolved s/p Epley maneuver at home. No further concerns. With rhinosinusitis and allergies eustachian tube dysfunction may be at risk in future.

## 2016-06-09 NOTE — Assessment & Plan Note (Signed)
Likely chronic contributing factor to current presentation. See A&P

## 2016-06-09 NOTE — Progress Notes (Signed)
Subjective:    Patient ID: Charles York, male    DOB: May 08, 1930, 83 y.o.   MRN: [id number]  Charles York is a 83 y.o. male presenting on 06/09/2016 for Sore Throat (onset week has some sinus drainage no fever or chills)  Patient presents for a same day appointment.  HPI  ALLERGIC RHINOSINUSITIS / Acute R Eustachian Tube Dysfunction / History of chronic sinusitis Reports symptoms started about 1 week ago with R lower throat soreness and increased phlegm in throat, without improvement or resolution. Worsening recently with some localized R sided ear pain. - History of some allergies and sinus issues, previously given Flonase rx, now only using as needed - Admits R sided ear ache, some mild sinus frontal headache - Denies any fevers/chills, sweats, cough, dyspnea, nausea, vomiting  History of BPPV - Last visit 02/27/16 with me, see note for background information, he was instructed on Epley maneuever - Today reports this problem had resolved after Epley, no further problems with vertigo - Doing well - Denies dizziness, vertigo, lightheadedness  Social History  Substance Use Topics  . Smoking status: Former Smoker    Quit date: 02/04/1983  . Smokeless tobacco: Never Used  . Alcohol use No    Review of Systems Per HPI unless specifically indicated above     Objective:    BP 138/68 (BP Location: Left Arm, Cuff Size: Normal)   Pulse 73   Temp 97.9 F (36.6 C) (Oral)   Resp 16   Ht 5\' 10"  (1.778 m)   Wt 195 lb (88.5 kg)   SpO2 97%   BMI 27.98 kg/m   Wt Readings from Last 3 Encounters:  06/09/16 195 lb (88.5 kg)  02/27/16 193 lb (87.5 kg)  10/11/15 195 lb (88.5 kg)    Physical Exam  Constitutional: He is oriented to person, place, and time. He appears well-developed and well-nourished. No distress.  Well-appearing, comfortable, cooperative  HENT:  Head: Normocephalic and atraumatic.  Mouth/Throat: Oropharynx is clear and moist.  Frontal sinuses mildly tender to  palpation, maxillary sinuses non tender. Nares patent with some deeper turbinate edema without purulence.   R TM with mild to moderate opaque effusion and fullness without erythema or bulging. Non tender tragus or mastoid process, some localized tenderness lower R jaw below mandible. L TM clear without erythema, effusion or bulging. Oropharynx with mild posterior pharyngeal drainage without erythema, exudates, edema or asymmetry.  Eyes: Conjunctivae are normal. Right eye exhibits no discharge. Left eye exhibits no discharge.  Neck: Normal range of motion. Neck supple. No thyromegaly present.  Cardiovascular: Normal rate, regular rhythm, normal heart sounds and intact distal pulses.   No murmur heard. Pulmonary/Chest: Effort normal and breath sounds normal. No respiratory distress. He has no wheezes. He has no rales.  Lymphadenopathy:    He has no cervical adenopathy.  Neurological: He is alert and oriented to person, place, and time.  Skin: Skin is warm and dry. No rash noted. He is not diaphoretic. No erythema.  Psychiatric: He has a normal mood and affect. His behavior is normal.  Nursing note and vitals reviewed.  Results for orders placed or performed in visit on 19/50/93  BASIC METABOLIC PANEL WITH GFR  Result Value Ref Range   Sodium 137 135 - 146 mmol/L   Potassium 5.1 3.5 - 5.3 mmol/L   Chloride 102 98 - 110 mmol/L   CO2 26 20 - 31 mmol/L   Glucose, Bld 187 (H) 65 - 99 mg/dL  BUN 26 (H) 7 - 25 mg/dL   Creat 1.38 (H) 0.70 - 1.11 mg/dL   Calcium 9.2 8.6 - 10.3 mg/dL   GFR, Est African American 54 (L) >=60 mL/min   GFR, Est Non African American 47 (L) >=60 mL/min      Assessment & Plan:   Problem List Items Addressed This Visit    Chronic pansinusitis    Likely chronic contributing factor to current presentation. See A&P      BPPV (benign paroxysmal positional vertigo), left    Resolved s/p Epley maneuver at home. No further concerns. With rhinosinusitis and allergies  eustachian tube dysfunction may be at risk in future.      Allergic rhinitis due to allergen    Clinically with persistent allergic rhinosinusitis and now R eustachian tube dysfunction in setting of chronic sinusitis. - non adherent on Floanse, only using PRN - not on anti-histamine  Plan: 1. Start OTC Loratadine 10mg  daily 2. Resume Flonase 2 sprays each nare daily for 4-6 weeks likely longer, may take few weeks for full effect 3. Improve hydration 4. Follow-up if not improved       Other Visit Diagnoses    Acute dysfunction of right eustachian tube    -  Primary  Consistent with R TM effusion and pain likely eustachian tube dysfunction, with allergies and sinusitis, without clear evidence of bacterial sinus infection today. - hold antibiotics at this time - treat per above allergy regimen - if worsening follow-up consider Azithromycin Z-pak given PCN allergy       No orders of the defined types were placed in this encounter.     Follow up plan: Return in about 2 weeks (around 06/23/2016), or if symptoms worsen or fail to improve, for Allergic Sinusitis / R Eustachian Tube Dysfunction.  Nobie Putnam, DO [facility] 06/09/2016, 1:10 PM

## 2016-06-09 NOTE — Patient Instructions (Signed)
Thank you for coming to the clinic today.  1. It sounds like you have persistent Sinus Congestion or "Rhinosinusitis" - I do not think that this is a Bacterial Sinus Infection. Usually these are caused by Viruses or Allergies, and will run it's course in about 7 to 10 days. - You have some fluid back-up behind Right ear drum, causing some pressure and aching likely what you are feeling, drainage down back of throat is causing sore throat most likely - No antibiotics are needed - Start OTC Loratadine (Claritin) 10mg  daily and use existing Flonase 2 sprays in each nostril daily for next 4-6 weeks, then you may stop and use seasonally or as needed - Recommend to keep using Nasal Saline spray multiple times a day to help flush out congestion and clear sinuses - Improve hydration by drinking plenty of clear fluids (water, gatorade) to reduce secretions and thin congestion - Congestion draining down throat can cause irritation. May try warm herbal tea with honey, cough drops  If you develop persistent fever >101F for at least 3 consecutive days, headaches with sinus pain or pressure or persistent earache, please schedule a follow-up evaluation within next few days to week.  We can consider Azithromycin Z-pak antibiotic in future if not improving, call our office.  Please schedule a follow-up appointment with Dr. Parks Ranger within 2 weeks as needed for Allergic Sinusitis / R Eustachian Tube Dysfunction  If you have any other questions or concerns, please feel free to call the clinic or send a message through [place]. You may also schedule an earlier appointment if necessary.  Nobie Putnam, DO [facility]

## 2016-06-09 NOTE — Assessment & Plan Note (Signed)
Clinically with persistent allergic rhinosinusitis and now R eustachian tube dysfunction in setting of chronic sinusitis. - non adherent on Floanse, only using PRN - not on anti-histamine  Plan: 1. Start OTC Loratadine 10mg  daily 2. Resume Flonase 2 sprays each nare daily for 4-6 weeks likely longer, may take few weeks for full effect 3. Improve hydration 4. Follow-up if not improved

## 2016-06-29 ENCOUNTER — Encounter: Payer: Self-pay | Admitting: Family Medicine

## 2016-07-15 ENCOUNTER — Ambulatory Visit (INDEPENDENT_AMBULATORY_CARE_PROVIDER_SITE_OTHER): Payer: Medicare Other | Admitting: Family Medicine

## 2016-07-15 ENCOUNTER — Encounter: Payer: Self-pay | Admitting: Family Medicine

## 2016-07-15 VITALS — BP 114/61 | HR 73 | Temp 98.0°F | Ht 70.0 in | Wt 190.0 lb

## 2016-07-15 DIAGNOSIS — E559 Vitamin D deficiency, unspecified: Secondary | ICD-10-CM

## 2016-07-15 DIAGNOSIS — J3089 Other allergic rhinitis: Secondary | ICD-10-CM | POA: Diagnosis not present

## 2016-07-15 DIAGNOSIS — R799 Abnormal finding of blood chemistry, unspecified: Secondary | ICD-10-CM

## 2016-07-15 DIAGNOSIS — R400 Somnolence: Secondary | ICD-10-CM | POA: Insufficient documentation

## 2016-07-15 DIAGNOSIS — H9311 Tinnitus, right ear: Secondary | ICD-10-CM | POA: Insufficient documentation

## 2016-07-15 DIAGNOSIS — E538 Deficiency of other specified B group vitamins: Secondary | ICD-10-CM

## 2016-07-15 DIAGNOSIS — R5383 Other fatigue: Secondary | ICD-10-CM | POA: Insufficient documentation

## 2016-07-15 DIAGNOSIS — Z862 Personal history of diseases of the blood and blood-forming organs and certain disorders involving the immune mechanism: Secondary | ICD-10-CM

## 2016-07-15 DIAGNOSIS — H9113 Presbycusis, bilateral: Secondary | ICD-10-CM | POA: Insufficient documentation

## 2016-07-15 DIAGNOSIS — N183 Chronic kidney disease, stage 3 unspecified: Secondary | ICD-10-CM

## 2016-07-15 LAB — CBC WITH DIFFERENTIAL/PLATELET
Basophils Absolute: 72 cells/uL (ref 0–200)
Basophils Relative: 1 %
Eosinophils Absolute: 144 cells/uL (ref 15–500)
Eosinophils Relative: 2 %
HCT: 39.6 % (ref 38.5–50.0)
Hemoglobin: 13.4 g/dL (ref 13.2–17.1)
Lymphocytes Relative: 33 %
Lymphs Abs: 2376 cells/uL (ref 850–3900)
MCH: 32.1 pg (ref 27.0–33.0)
MCHC: 33.8 g/dL (ref 32.0–36.0)
MCV: 95 fL (ref 80.0–100.0)
MPV: 10.7 fL (ref 7.5–12.5)
Monocytes Absolute: 504 cells/uL (ref 200–950)
Monocytes Relative: 7 %
Neutro Abs: 4104 cells/uL (ref 1500–7800)
Neutrophils Relative %: 57 %
Platelets: 240 10*3/uL (ref 140–400)
RBC: 4.17 MIL/uL — ABNORMAL LOW (ref 4.20–5.80)
RDW: 13.3 % (ref 11.0–15.0)
WBC: 7.2 10*3/uL (ref 3.8–10.8)

## 2016-07-15 LAB — TSH: TSH: 2.4 mIU/L (ref 0.40–4.50)

## 2016-07-15 NOTE — Assessment & Plan Note (Addendum)
Suspected likely etiology for some persistent ear symptoms now with buzzing/tinnitus - Improved congestion, effusion as well - No evidence of AOM - Continues nasal sprays  Additionally, clinically not consistent with acute sinusitis, otherwise if symptoms change perhaps maybe infectious etiology for fatigue and other symptoms, may try trial antibiotics

## 2016-07-15 NOTE — Assessment & Plan Note (Signed)
Likely self limited, maybe related to effusion from before Consider follow-up ENT if unresolved

## 2016-07-15 NOTE — Assessment & Plan Note (Addendum)
Suspected contributing to chronic gradual hearing loss in 83 yr old patient May be related to some recent affected R ear hearing changes with tinnitus Recommended future evaluation from ENT / Audiology hearing test, patient declines referral at this time, follow-up in future as needed

## 2016-07-15 NOTE — Assessment & Plan Note (Signed)
CKD-III with elevated Cr at baseline 1.5-1.6, last check 07/2015, [facility] labs Due to history of HypoK  Plan: 1. Check BMET

## 2016-07-15 NOTE — Patient Instructions (Addendum)
Thank you for coming to the clinic today.  1. Blood tests today to check for any medical cause of sleepiness / fatigue  - Will notify you with results.  I don't think it is related to bug bite, but this is not impossible.  2. Continue taking Vitamin B12 - will notify you of results. If Vitamin D is low - will recommend to start taking Vitamin D3 5,000 units ONCE DAILY - for 3 months, then can decrease dose to Vitamin D3 2,000 unit day every day  Also it could be due to mood - I would recommend a trial on Zoloft in about 4-6 weeks from now if still persistent problem, you can notify me if you want to try this  3. Continue nose spray  4. For Ear - you have ringing or tinnitus, this can be very normal.  I do recommend a hearing test in future and consider hearing aids - this is where we would refer you when you are ready  [id number]  [place], [place] [address] Ph: [phone]  If you decide to change to me for diabetes - need to see prior A1c record, bring blood sugar readings   Please schedule a Follow-up Appointment to: Return in about 4 months (around 11/14/2016) for diabetes A1c, fatigue/low energy.  If you have any other questions or concerns, please feel free to call the clinic or send a message through [place]. You may also schedule an earlier appointment if necessary.  Nobie Putnam, DO [place]

## 2016-07-15 NOTE — Assessment & Plan Note (Signed)
Uncertain etiology, possible multifactorial etiology, consider metabolic etiology hypoK vs M40, uncertain why acute symptoms. Clinically unlikely for insect bite or viral illness given no significant symptoms, no evidence of tick on bite or erythema migrans. Likely some mood component, despite PHQ2: 0  Plan: 1. Check labs today - BMET, TSH, CBC, Vit D, Vit B12 2. Follow-up results - if no correctable cause, may wait several weeks if gradual improvement, reassurance. Otherwise if not improved consider trial on SSRI within 4-6 weeks

## 2016-07-15 NOTE — Progress Notes (Signed)
Subjective:    Patient ID: Charles York, male    DOB: 1930-09-28, 83 y.o.   MRN: [id number]  Charles York is a 83 y.o. male presenting on 07/15/2016 for Ear Pain (Right side Ringing and he feels like he wants to sleep all the time have no energy onset week)  Patient presents for a same day appointment.  HPI   Right Ear Tinnitus vs Buzzing: - Reports new problem now with Right ear abnormal "buzzing" sound vs possible tinnitus, some diminished hearing, seems to be gradual persistent problem for past 4-6 weeks, about since last visit. History of eustachian tube dysfunction and mild ear effusion in past, last seen 06/09/16 - No known chronic problem in past with tinnitus - Admits gradual age related hearing decreased, without - Denies headache, sinus pain or pressure, dizziness / vertigo , fevers/chills  Fatigue / Decreased Energy / Daytime Sleepiness: - Reports recent concern over past 2-3 weeks with increased symptoms of fatigue and sleepiness, he states may have had similar symptoms in past, now is significantly worse. States he can get up in morning, watch TV, eat, can sleep 4-5 hours during day, at any time with some daytime sleepiness, he can fall asleep easily - No prior TSH, Vit D testing - Taking Vitamin B12 1040mg daily - Also history of Hypokalemia in past with some similar symptoms, last K was normal - Not endorsing depression, wife died he was depressed for 1-2 years, but has been doing much better since, never on SSRI medications, would consider this in future - Additionally admits had bug bite 3 weeks ago, small red raised bump, now still scab, thinks this is related - Admits nocturia x 2, not significant amount to keep him awake - Denies any snoring or sleep apnea events, shortness of breath, chest pain or pressure, pre-syncope or syncope, lightheadedness  Additionally he is asking about switching [place] care [facility] - [place] [facility] [place] [facility]  to drug store - He currently takes Lantus 30u in AM, and Novolog 12u BID (breakfast, dinner) - Reports last A1c about 8.0  ALLERGIC RHINOSINUSITIS - Improved on nose spray. Still has some post nasal drip.  Social History  Substance Use Topics  . Smoking status: Former Smoker    Quit date: 02/04/1983  . Smokeless tobacco: Never Used  . Alcohol use No   [facility] 07/15/2016 07/15/2016 07/04/2015  Decreased Interest 0 0 2  Down, Depressed, Hopeless 0 0 2  PHQ - 2 Score 0 0 4  Altered sleeping - - 3  Tired, decreased energy - - 3  Change in appetite - - 0  Feeling bad or failure about yourself  - - 2  Trouble concentrating - - 0  Moving slowly or fidgety/restless - - 0  Suicidal thoughts - - 0  PHQ-9 Score - - 12  Difficult doing work/chores - - Not difficult at all    Review of Systems Per HPI unless specifically indicated above     Objective:    BP 114/61   Pulse 73   Temp 98 F (36.7 C) (Oral)   Ht 5' 10" (1.778 m)   Wt 190 lb (86.2 kg)   BMI 27.26 kg/m   Wt Readings from Last 3 Encounters:  07/15/16 190 lb (86.2 kg)  06/09/16 195 lb (88.5 kg)  02/27/16 193 lb (87.5 kg)    Physical Exam  Constitutional: He is oriented to person, place,  and time. He appears well-developed and well-nourished. No distress.  Well-appearing, comfortable, cooperative  HENT:  Head: Normocephalic and atraumatic.  Mouth/Throat: Oropharynx is clear and moist.  Frontal sinuses resolved tenderness. Nares patent with some deeper turbinate edema without purulence.  R TM interval exam appears improved effusion, now mostly clear, no erythema, no purulence. No other abnormality.  L TM remains clear without erythema, effusion or bulging. Oropharynx with mild posterior pharyngeal drainage without erythema, exudates, edema or asymmetry.  Eyes: Conjunctivae are normal. Right eye exhibits no discharge. Left eye exhibits no discharge.  Neck: Normal range of motion. Neck supple. No  thyromegaly present.  Cardiovascular: Normal rate, regular rhythm, normal heart sounds and intact distal pulses.   No murmur heard. Pulmonary/Chest: Effort normal and breath sounds normal. No respiratory distress. He has no wheezes. He has no rales.  Musculoskeletal: He exhibits no edema.  Lymphadenopathy:    He has no cervical adenopathy.  Neurological: He is alert and oriented to person, place, and time.  Skin: Skin is warm and dry. No rash noted. He is not diaphoretic. No erythema.  Psychiatric: He has a normal mood and affect. His behavior is normal.  Well groomed, good eye contact, normal speech and thoughts  Nursing note and vitals reviewed.  Results for orders placed or performed in visit on 07/15/16  TSH  Result Value Ref Range   TSH  0.40 - 4.50 mIU/L  COMPLETE METABOLIC PANEL WITH GFR  Result Value Ref Range   Sodium  135 - 146 mmol/L   Potassium  3.5 - 5.3 mmol/L   Chloride  98 - 110 mmol/L   CO2  20 - 31 mmol/L   Glucose, Bld  65 - 99 mg/dL   BUN  7 - 25 mg/dL   Creat  0.70 - 1.11 mg/dL   Total Bilirubin  0.2 - 1.2 mg/dL   Alkaline Phosphatase  40 - 115 U/L   AST  10 - 35 U/L   ALT  9 - 46 U/L   Total Protein  6.1 - 8.1 g/dL   Albumin  3.6 - 5.1 g/dL   Calcium  8.6 - 10.3 mg/dL   GFR, Est African American  >=60 mL/min   GFR, Est Non African American  >=60 mL/min  CBC with Differential/Platelet  Result Value Ref Range   WBC 7.2 3.8 - 10.8 K/uL   RBC 4.17 (L) 4.20 - 5.80 MIL/uL   Hemoglobin 13.4 13.2 - 17.1 g/dL   HCT 39.6 38.5 - 50.0 %   MCV 95.0 80.0 - 100.0 fL   MCH 32.1 27.0 - 33.0 pg   MCHC 33.8 32.0 - 36.0 g/dL   RDW 13.3 11.0 - 15.0 %   Platelets 240 140 - 400 K/uL   MPV 10.7 7.5 - 12.5 fL   Neutro Abs 4,104 1,500 - 7,800 cells/uL   Lymphs Abs 2,376 850 - 3,900 cells/uL   Monocytes Absolute 504 200 - 950 cells/uL   Eosinophils Absolute 144 15 - 500 cells/uL   Basophils Absolute 72 0 - 200 cells/uL   Neutrophils Relative % 57 %   Lymphocytes  Relative 33 %   Monocytes Relative 7 %   Eosinophils Relative 2 %   Basophils Relative 1 %   Smear Review Criteria for review not met   VITAMIN D 25 Hydroxy (Vit-D Deficiency, Fractures)  Result Value Ref Range   Vit D, 25-Hydroxy  30 - 100 ng/mL  Vitamin B12  Result Value Ref Range   Vitamin  B-12  200 - 1100 pg/mL      Assessment & Plan:   Problem List Items Addressed This Visit    Tinnitus of right ear    Likely self limited, maybe related to effusion from before Consider follow-up ENT if unresolved      Presbycusis of both ears    Suspected contributing to chronic gradual hearing loss in 83 yr old patient May be related to some recent affected R ear hearing changes with tinnitus Recommended future evaluation from ENT / Audiology hearing test, patient declines referral at this time, follow-up in future as needed      Fatigue - Primary    Uncertain etiology, possible multifactorial etiology, consider metabolic etiology hypoK vs X48, uncertain why acute symptoms. Clinically unlikely for insect bite or viral illness given no significant symptoms, no evidence of tick on bite or erythema migrans. Likely some mood component, despite PHQ2: 0  Plan: 1. Check labs today - BMET, TSH, CBC, Vit D, Vit B12 2. Follow-up results - if no correctable cause, may wait several weeks if gradual improvement, reassurance. Otherwise if not improved consider trial on SSRI within 4-6 weeks      Relevant Orders   TSH (Completed)   COMPLETE METABOLIC PANEL WITH GFR (Completed)   CBC with Differential/Platelet (Completed)   Daytime sleepiness    Clinically not consistent with sleep apnea based on symptoms Work-up metabolic causes first, may be related to hypersomnia if possible underlying mood problem      CKD (chronic kidney disease), stage III    CKD-III with elevated Cr at baseline 1.5-1.6, last check 07/2015, [facility] Due to history of HypoK  Plan: 1.  Check BMET      Relevant Orders   COMPLETE METABOLIC PANEL WITH GFR (Completed)   Allergic rhinitis due to allergen    Suspected likely etiology for some persistent ear symptoms now with buzzing/tinnitus - Improved congestion, effusion as well - No evidence of AOM - Continues nasal sprays  Additionally, clinically not consistent with acute sinusitis, otherwise if symptoms change perhaps maybe infectious etiology for fatigue and other symptoms, may try trial antibiotics       Other Visit Diagnoses    Vitamin D deficiency       Relevant Orders   VITAMIN D 25 Hydroxy (Vit-D Deficiency, Fractures) (Completed)   Vitamin B 12 deficiency       Relevant Orders   Vitamin B12 (Completed)   History of anemia       Relevant Orders   CBC with Differential/Platelet (Completed)   Abnormal blood chemistry       Relevant Orders   TSH (Completed)     No orders of the defined types were placed in this encounter.   Follow up plan: Return in about 4 months (around 11/14/2016) for diabetes A1c, fatigue/low energy.  Will need record from [place] if plans to transfer DM care to PCP.  Nobie Putnam, [facility] 07/15/2016, 10:54 PM

## 2016-07-15 NOTE — Assessment & Plan Note (Signed)
Clinically not consistent with sleep apnea based on symptoms Work-up metabolic causes first, may be related to hypersomnia if possible underlying mood problem

## 2016-07-16 LAB — COMPLETE METABOLIC PANEL WITH GFR
ALT: 12 U/L (ref 9–46)
AST: 12 U/L (ref 10–35)
Albumin: 3.9 g/dL (ref 3.6–5.1)
Alkaline Phosphatase: 91 U/L (ref 40–115)
BUN: 26 mg/dL — ABNORMAL HIGH (ref 7–25)
CO2: 23 mmol/L (ref 20–31)
Calcium: 9 mg/dL (ref 8.6–10.3)
Chloride: 106 mmol/L (ref 98–110)
Creat: 1.71 mg/dL — ABNORMAL HIGH (ref 0.70–1.11)
GFR, Est African American: 42 mL/min — ABNORMAL LOW (ref >=60)
GFR, Est Non African American: 36 mL/min — ABNORMAL LOW (ref >=60)
Glucose, Bld: 120 mg/dL — ABNORMAL HIGH (ref 65–99)
Potassium: 4.8 mmol/L (ref 3.5–5.3)
Sodium: 140 mmol/L (ref 135–146)
Total Bilirubin: 0.4 mg/dL (ref 0.2–1.2)
Total Protein: 6.6 g/dL (ref 6.1–8.1)

## 2016-07-16 LAB — VITAMIN D 25 HYDROXY (VIT D DEFICIENCY, FRACTURES): Vit D, 25-Hydroxy: 39 ng/mL (ref 30–100)

## 2016-07-16 LAB — VITAMIN B12: Vitamin B-12: 807 pg/mL (ref 200–1100)

## 2016-09-25 ENCOUNTER — Ambulatory Visit (INDEPENDENT_AMBULATORY_CARE_PROVIDER_SITE_OTHER): Payer: Medicare Other | Admitting: Family Medicine

## 2016-09-25 ENCOUNTER — Encounter: Payer: Self-pay | Admitting: Family Medicine

## 2016-09-25 VITALS — BP 127/53 | HR 72 | Temp 98.3°F | Resp 16 | Ht 70.0 in | Wt 193.0 lb

## 2016-09-25 DIAGNOSIS — L97521 Non-pressure chronic ulcer of other part of left foot limited to breakdown of skin: Secondary | ICD-10-CM | POA: Diagnosis not present

## 2016-09-25 DIAGNOSIS — E1140 Type 2 diabetes mellitus with diabetic neuropathy, unspecified: Secondary | ICD-10-CM

## 2016-09-25 MED ORDER — MUPIROCIN 2 % EX OINT: 1.0000 "application " | TOPICAL_OINTMENT | Freq: Two times a day (BID) | CUTANEOUS | 0 refills | Status: DC

## 2016-09-25 MED ORDER — CLINDAMYCIN HCL 300 MG PO CAPS: 300.0000 mg | ORAL_CAPSULE | Freq: Four times a day (QID) | ORAL | 0 refills | Status: DC

## 2016-09-25 NOTE — Progress Notes (Signed)
Subjective:    Patient ID: Charles York, male    DOB: 1930-08-25, 83 y.o.   MRN: [id number]  Charles York is a 83 y.o. male presenting on 09/25/2016 for Toe Pain (pt is concerned since he is diabetic used peroxide but looks red and infected as per pt onset 5 days)   HPI   Left Toe, Injury / History of Diabetes and Neuropathy - Reports possible injury 5-6 days ago while working outside, thinks he had an abrasion on L great toe. He noticed redness and ulceration on top of L great toe, with slight amount of bleeding and serosanguinous drainage, without any purulent drainage. He has tried using hydrogen peroxide regularly to keep the wound clean - He has controlled Type 2 Diabetes, last A1c 7s, has chronic neuropathy - Denies any fever/chills, toe pain (due to neuropathy), other joint pain or redness, nausea vomiting  Social History  Substance Use Topics  . Smoking status: Former Smoker    Quit date: 02/04/1983  . Smokeless tobacco: Never Used  . Alcohol use No    Review of Systems Per HPI unless specifically indicated above     Objective:    BP (!) 127/53   Pulse 72   Temp 98.3 F (36.8 C) (Oral)   Resp 16   Ht 5' 10" (1.778 m)   Wt 193 lb (87.5 kg)   BMI 27.69 kg/m   Wt Readings from Last 3 Encounters:  09/25/16 193 lb (87.5 kg)  07/15/16 190 lb (86.2 kg)  06/09/16 195 lb (88.5 kg)    Physical Exam  Constitutional: He is oriented to person, place, and time. He appears well-developed and well-nourished. No distress.  Well-appearing, comfortable, cooperative  HENT:  Head: Normocephalic and atraumatic.  Mouth/Throat: Oropharynx is clear and moist.  Eyes: Conjunctivae are normal. Right eye exhibits no discharge. Left eye exhibits no discharge.  Cardiovascular: Normal rate.   Pulmonary/Chest: Effort normal.  Musculoskeletal: He exhibits no edema.  Neurological: He is alert and oriented to person, place, and time.  Skin: Skin is warm and dry. No rash noted. He is not  diaphoretic. There is erythema (localized L great toe).  L great toe dorsal midline 1 x 2 cm area of superficial abrasion with normal healing scab formation, some localized soft tissue edema and erythema locally without extension, mild tender, no drainage, no fluctuance  Psychiatric: He has a normal mood and affect. His behavior is normal.  Well groomed, good eye contact, normal speech and thoughts  Nursing note and vitals reviewed.   L Great toe     Results for orders placed or performed in visit on 07/15/16  TSH  Result Value Ref Range   TSH 2.40 0.40 - 4.50 mIU/L  COMPLETE METABOLIC PANEL WITH GFR  Result Value Ref Range   Sodium 140 135 - 146 mmol/L   Potassium 4.8 3.5 - 5.3 mmol/L   Chloride 106 98 - 110 mmol/L   CO2 23 20 - 31 mmol/L   Glucose, Bld 120 (H) 65 - 99 mg/dL   BUN 26 (H) 7 - 25 mg/dL   Creat 1.71 (H) 0.70 - 1.11 mg/dL   Total Bilirubin 0.4 0.2 - 1.2 mg/dL   Alkaline Phosphatase 91 40 - 115 U/L   AST 12 10 - 35 U/L   ALT 12 9 - 46 U/L   Total Protein 6.6 6.1 - 8.1 g/dL   Albumin 3.9 3.6 - 5.1 g/dL   Calcium 9.0 8.6 - 10.3 mg/dL  GFR, Est African American 42 (L) >=60 mL/min   GFR, Est Non African American 36 (L) >=60 mL/min  CBC with Differential/Platelet  Result Value Ref Range   WBC 7.2 3.8 - 10.8 K/uL   RBC 4.17 (L) 4.20 - 5.80 MIL/uL   Hemoglobin 13.4 13.2 - 17.1 g/dL   HCT 39.6 38.5 - 50.0 %   MCV 95.0 80.0 - 100.0 fL   MCH 32.1 27.0 - 33.0 pg   MCHC 33.8 32.0 - 36.0 g/dL   RDW 13.3 11.0 - 15.0 %   Platelets 240 140 - 400 K/uL   MPV 10.7 7.5 - 12.5 fL   Neutro Abs 4,104 1,500 - 7,800 cells/uL   Lymphs Abs 2,376 850 - 3,900 cells/uL   Monocytes Absolute 504 200 - 950 cells/uL   Eosinophils Absolute 144 15 - 500 cells/uL   Basophils Absolute 72 0 - 200 cells/uL   Neutrophils Relative % 57 %   Lymphocytes Relative 33 %   Monocytes Relative 7 %   Eosinophils Relative 2 %   Basophils Relative 1 %   Smear Review Criteria for review not met     VITAMIN D 25 Hydroxy (Vit-D Deficiency, Fractures)  Result Value Ref Range   Vit D, 25-Hydroxy 39 30 - 100 ng/mL  Vitamin B12  Result Value Ref Range   Vitamin B-12 807 200 - 1,100 pg/mL      Assessment & Plan:   Problem List Items Addressed This Visit    Type 2 diabetes, controlled, with neuropathy ([place])    [facility] Cont[facility] report Concern with known neuropathy, inc risk of foot injury Treat empirically for possible localized cellulitis L great toe See A&P      Skin ulcer of left great toe, limited to breakdown of skin (HCC) - Primary    Suspected healing L great toe DM ulceration from superficial abrasion, possible mild cellulitis infection without purulence, but does not appear to be extending. Do not appreciate actual non healing ulcer or deeper infection. No evidence of osteo, minimal tender, no purulence. No systemic symptoms. - Controlled DM, circulation is good  Plan: 1. Empiric coverage today with topical Mupirocin antibiotic ointment BID for 10-14 days, may stop peroxide, continue routine hygiene warm soapy water, allow open air and avoid re-injury friction tight shoes 2. Also given rx Clindamycin 35m QID x 7 days if not improving in 24-48 hours, again empiric in case worsening heading into weekend 3. RICE therapy 4. Reviewed strict return criteria if not improving or signs of worsening, when to go to hospital eval and when to return to office      Relevant Medications   mupirocin ointment (BACTROBAN) 2 %   clindamycin (CLEOCIN) 300 MG capsule      Meds ordered this encounter  Medications  . mupirocin ointment (BACTROBAN) 2 %    Sig: Apply 1 application topically 2 (two) times daily. For 10-14 days    Dispense:  22 g    Refill:  0  . clindamycin (CLEOCIN) 300 MG capsule    Sig: Take 1 capsule (300 mg total) by mouth 4 (four) times daily. X 7 days    Dispense:  28 capsule    Refill:  0    Follow up plan: Return in about 1 week  (around 10/02/2016), or if symptoms worsen or fail to improve, for Toe ulcer.  ANobie Putnam [facility] 09/25/2016, 5:58 PM

## 2016-09-25 NOTE — Patient Instructions (Addendum)
Thank you for coming to the clinic today.  1. You have a toe abrasion that is healing well and does not look infected, some redness around can be normal.  Itching is normal with healing.  Stop peroxide  Cleanse it with warm soapy water twice daily and afterwards use the topical antibiotic ointment for 10-14 days  If NOT improved dramatically in 24-48 hours, then go ahead and start Clindamycin antibiotic 1 pill 4 times a day for 7 days  You may need to return soon for re-evaluation if worsening such as spreading redness or streaking redness, significantly larger size, increased pain, fevers/chills, nausea vomiting and cannot take antibiotic. If significantly worse symptoms or most of these symptoms, would recommend going straight to Hospital Emergency Dept as you may require IV antibiotics instead.  Please schedule a Follow-up Appointment to: Return in about 1 week (around 10/02/2016), or if symptoms worsen or fail to improve, for Toe ulcer.  If you have any other questions or concerns, please feel free to call the clinic or send a message through [place]. You may also schedule an earlier appointment if necessary.  Additionally, you may be receiving a survey about your experience at our clinic within a few days to 1 week by e-mail or mail. We value your feedback.  Nobie Putnam, DO [place]

## 2016-09-25 NOTE — Assessment & Plan Note (Signed)
[facility] Controlled by report Concern with known neuropathy, inc risk of foot injury Treat empirically for possible localized cellulitis L great toe See A&P

## 2016-09-25 NOTE — Assessment & Plan Note (Signed)
Suspected healing L great toe DM ulceration from superficial abrasion, possible mild cellulitis infection without purulence, but does not appear to be extending. Do not appreciate actual non healing ulcer or deeper infection. No evidence of osteo, minimal tender, no purulence. No systemic symptoms. - Controlled DM, circulation is good  Plan: 1. Empiric coverage today with topical Mupirocin antibiotic ointment BID for 10-14 days, may stop peroxide, continue routine hygiene warm soapy water, allow open air and avoid re-injury friction tight shoes 2. Also given rx Clindamycin 300mg  QID x 7 days if not improving in 24-48 hours, again empiric in case worsening heading into weekend 3. RICE therapy 4. Reviewed strict return criteria if not improving or signs of worsening, when to go to hospital eval and when to return to office

## 2016-10-07 ENCOUNTER — Encounter: Payer: Self-pay | Admitting: Family Medicine

## 2016-10-07 ENCOUNTER — Ambulatory Visit (INDEPENDENT_AMBULATORY_CARE_PROVIDER_SITE_OTHER): Payer: Medicare Other | Admitting: Family Medicine

## 2016-10-07 VITALS — BP 145/63 | HR 73 | Temp 97.8°F | Resp 16 | Ht 70.0 in | Wt 189.6 lb

## 2016-10-07 DIAGNOSIS — L97521 Non-pressure chronic ulcer of other part of left foot limited to breakdown of skin: Secondary | ICD-10-CM

## 2016-10-07 MED ORDER — CLINDAMYCIN HCL 300 MG PO CAPS: 300.0000 mg | ORAL_CAPSULE | Freq: Four times a day (QID) | ORAL | 0 refills | Status: DC

## 2016-10-07 NOTE — Progress Notes (Signed)
Subjective:    Patient ID: Charles York, male    DOB: 07-15-1930, 83 y.o.   MRN: [id number]  Charles York is a 83 y.o. male presenting on 10/07/2016 for Wound Check (follow up)  Patient presents for a same day appointment.  HPI   FOLLOW-UP Left Toe, Injury / CELLULITIS, h/o DM, Neuropathy - Last visit with me 09/25/16, for initial visit for same problem concern superficial ulceration due to abrasion possible cellulitis, treated with topical Mupirocin antibiotic BID 10-14 days and also Clindamycin oral 333m QID x 7 days only if worsening over 48 hours on topical, see prior notes for background information. - Interval update with patient states he needed to use Clindamycin and it did help significantly - Today patient reports redness is still slightly present but mostly gone, superficial ulceration is healing, he thinks it is much improved, clindamycin helped it a lot, and asking if he can have it extended for a few more days - Finished antibiotic and finished topical Mupirocin, still has ointment if needed, no longer using it - He has controlled Type 2 Diabetes, last A1c 7s, has chronic neuropathy - Denies any fever/chills, toe pain (due to neuropathy), other joint pain or redness, nausea vomiting, drainage of pus  Social History  Substance Use Topics  . Smoking status: Former Smoker    Quit date: 02/04/1983  . Smokeless tobacco: Never Used  . Alcohol use No    Review of Systems Per HPI unless specifically indicated above     Objective:    BP (!) 145/63   Pulse 73   Temp 97.8 F (36.6 C) (Oral)   Resp 16   Ht 5' 10"  (1.778 m)   Wt 189 lb 9.6 oz (86 kg)   BMI 27.20 kg/m   Wt Readings from Last 3 Encounters:  10/07/16 189 lb 9.6 oz (86 kg)  09/25/16 193 lb (87.5 kg)  07/15/16 190 lb (86.2 kg)    Physical Exam  Constitutional: He is oriented to person, place, and time. He appears well-developed and well-nourished. No distress.  Well-appearing, comfortable, cooperative    HENT:  Head: Normocephalic and atraumatic.  Mouth/Throat: Oropharynx is clear and moist.  Eyes: Conjunctivae are normal. Right eye exhibits no discharge. Left eye exhibits no discharge.  Cardiovascular: Normal rate.   Pulmonary/Chest: Effort normal.  Musculoskeletal: He exhibits no edema.  Neurological: He is alert and oriented to person, place, and time.  Skin: Skin is warm and dry. No rash noted. He is not diaphoretic. No erythema (Mostly resolved, L great toe).  L great toe dorsal midline - healing and resolving superficial abrasion, now with healing scab, smaller area. Resolved edema. Non tender. No drainage. No significant erythema only mild residual  Psychiatric: He has a normal mood and affect. His behavior is normal.  Well groomed, good eye contact, normal speech and thoughts  Nursing note and vitals reviewed.   L Great toe       Results for orders placed or performed in visit on 07/15/16  TSH  Result Value Ref Range   TSH 2.40 0.40 - 4.50 mIU/L  COMPLETE METABOLIC PANEL WITH GFR  Result Value Ref Range   Sodium 140 135 - 146 mmol/L   Potassium 4.8 3.5 - 5.3 mmol/L   Chloride 106 98 - 110 mmol/L   CO2 23 20 - 31 mmol/L   Glucose, Bld 120 (H) 65 - 99 mg/dL   BUN 26 (H) 7 - 25 mg/dL   Creat 1.71 (H)  0.70 - 1.11 mg/dL   Total Bilirubin 0.4 0.2 - 1.2 mg/dL   Alkaline Phosphatase 91 40 - 115 U/L   AST 12 10 - 35 U/L   ALT 12 9 - 46 U/L   Total Protein 6.6 6.1 - 8.1 g/dL   Albumin 3.9 3.6 - 5.1 g/dL   Calcium 9.0 8.6 - 10.3 mg/dL   GFR, Est African American 42 (L) >=60 mL/min   GFR, Est Non African American 36 (L) >=60 mL/min  CBC with Differential/Platelet  Result Value Ref Range   WBC 7.2 3.8 - 10.8 K/uL   RBC 4.17 (L) 4.20 - 5.80 MIL/uL   Hemoglobin 13.4 13.2 - 17.1 g/dL   HCT 39.6 38.5 - 50.0 %   MCV 95.0 80.0 - 100.0 fL   MCH 32.1 27.0 - 33.0 pg   MCHC 33.8 32.0 - 36.0 g/dL   RDW 13.3 11.0 - 15.0 %   Platelets 240 140 - 400 K/uL   MPV 10.7 7.5 - 12.5 fL    Neutro Abs 4,104 1,500 - 7,800 cells/uL   Lymphs Abs 2,376 850 - 3,900 cells/uL   Monocytes Absolute 504 200 - 950 cells/uL   Eosinophils Absolute 144 15 - 500 cells/uL   Basophils Absolute 72 0 - 200 cells/uL   Neutrophils Relative % 57 %   Lymphocytes Relative 33 %   Monocytes Relative 7 %   Eosinophils Relative 2 %   Basophils Relative 1 %   Smear Review Criteria for review not met   VITAMIN D 25 Hydroxy (Vit-D Deficiency, Fractures)  Result Value Ref Range   Vit D, 25-Hydroxy 39 30 - 100 ng/mL  Vitamin B12  Result Value Ref Range   Vitamin B-12 807 200 - 1,100 pg/mL      Assessment & Plan:   Problem List Items Addressed This Visit    RESOLVED: Skin ulcer of left great toe, limited to breakdown of skin (HCC) - Primary    Significantly improved L great toe superficial dorsal ulceration, now with resolving cellulitis, no evidence of complication Good response to oral clindamycin  Plan: 1. Agree extend coverage Clindamycin x 3 more days, stop Mupirocin, new rx written 2. Continue routine wound care, no other changes needed 3. Follow-up as needed, reviewed return criteria      Relevant Medications   clindamycin (CLEOCIN) 300 MG capsule      Meds ordered this encounter  Medications  . clindamycin (CLEOCIN) 300 MG capsule    Sig: Take 1 capsule (300 mg total) by mouth 4 (four) times daily. X 3 days, extended course    Dispense:  12 capsule    Refill:  0    Follow up plan: Return if symptoms worsen or fail to improve, for as scheduled for DM in 1 month.  Nobie Putnam, [facility] 10/07/2016, 5:47 PM

## 2016-10-07 NOTE — Assessment & Plan Note (Signed)
Significantly improved L great toe superficial dorsal ulceration, now with resolving cellulitis, no evidence of complication Good response to oral clindamycin  Plan: 1. Agree extend coverage Clindamycin x 3 more days, stop Mupirocin, new rx written 2. Continue routine wound care, no other changes needed 3. Follow-up as needed, reviewed return criteria

## 2016-10-07 NOTE — Patient Instructions (Addendum)
Thank you for coming to the clinic today.  1. Keep up the good work 2. Healing well. Scab is normal 3. Extended antibiotic for 3 days as requested, finish rest of course 4. May continue Mupirocin ointment as needed  If worsening, redness, swelling, pain drainage, fever/chills, follow-up sooner or notify office  Please schedule a Follow-up Appointment to: Return if symptoms worsen or fail to improve, for as scheduled for DM in 1 month.  If you have any other questions or concerns, please feel free to call the clinic or send a message through [facility]. You may also schedule an earlier appointment if necessary.  Additionally, you may be receiving a survey about your experience at our clinic within a few days to 1 week by e-mail or mail. We value your feedback.  Nobie Putnam, DO [place]

## 2016-11-04 ENCOUNTER — Ambulatory Visit (INDEPENDENT_AMBULATORY_CARE_PROVIDER_SITE_OTHER): Payer: Medicare Other

## 2016-11-04 VITALS — BP 134/70 | HR 82 | Temp 98.1°F | Resp 20 | Ht 70.0 in | Wt 190.0 lb

## 2016-11-04 DIAGNOSIS — Z0000 Encounter for general adult medical examination without abnormal findings: Secondary | ICD-10-CM

## 2016-11-04 NOTE — Patient Instructions (Addendum)
[name] , Thank you for taking time to come for your Medicare Wellness Visit. I appreciate your ongoing commitment to your health goals. Please review the following plan we discussed and let me know if I can assist you in the future.   Screening recommendations/referrals: Colonoscopy: no longer required Recommended yearly ophthalmology/optometry visit for glaucoma screening and checkup Recommended yearly dental visit for hygiene and checkup  Vaccinations: Influenza vaccine: Completed. Please bring a copy with you to your next OV. Pneumococcal vaccine: Second dose to complete series will be due 05/2017 Tdap vaccine: completed Shingles vaccine: completed series. Please bring a copy with you to your next OV.   Advanced directives: Advance directive discussed with you today. Even though you declined this today please call our office should you change your mind and we can give you the proper paperwork for you to fill out.  Conditions/risks identified: Recommend eliminating [employer] and replacing with 4-6 glasses of water per day  Next appointment: Scheduled to see Dr. Parks Ranger on 11/18/16 @ 8:00am. Follow up annual wellness in one year.  Preventive Care 63 Years and Older, Male Preventive care refers to lifestyle choices and visits with your health care provider that can promote health and wellness. What does preventive care include?  A yearly physical exam. This is also called an annual well check.  Dental exams once or twice a year.  Routine eye exams. Ask your health care provider how often you should have your eyes checked.  Personal lifestyle choices, including:  Daily care of your teeth and gums.  Regular physical activity.  Eating a healthy diet.  Avoiding tobacco and drug use.  Limiting alcohol use.  Practicing safe sex.  Taking low doses of aspirin every day.  Taking vitamin and mineral supplements as recommended by your health care provider. What happens  during an annual well check? The services and screenings done by your health care provider during your annual well check will depend on your age, overall health, lifestyle risk factors, and family history of disease. Counseling  Your health care provider may ask you questions about your:  Alcohol use.  Tobacco use.  Drug use.  Emotional well-being.  Home and relationship well-being.  Sexual activity.  Eating habits.  History of falls.  Memory and ability to understand (cognition).  Work and work Statistician. Screening  You may have the following tests or measurements:  Height, weight, and BMI.  Blood pressure.  Lipid and cholesterol levels. These may be checked every 5 years, or more frequently if you are over 47 years old.  Skin check.  Lung cancer screening. You may have this screening every year starting at age 10 if you have a 30-pack-year history of smoking and currently smoke or have quit within the past 15 years.  Fecal occult blood test (FOBT) of the stool. You may have this test every year starting at age 73.  Flexible sigmoidoscopy or colonoscopy. You may have a sigmoidoscopy every 5 years or a colonoscopy every 10 years starting at age 38.  Prostate cancer screening. Recommendations will vary depending on your family history and other risks.  Hepatitis C blood test.  Hepatitis B blood test.  Sexually transmitted disease (STD) testing.  Diabetes screening. This is done by checking your blood sugar (glucose) after you have not eaten for a while (fasting). You may have this done every 1-3 years.  Abdominal aortic aneurysm (AAA) screening. You may need this if you are a current or former smoker.  Osteoporosis.  You may be screened starting at age 66 if you are at high risk. Talk with your health care provider about your test results, treatment options, and if necessary, the need for more tests. Vaccines  Your health care provider may recommend certain  vaccines, such as:  Influenza vaccine. This is recommended every year.  Tetanus, diphtheria, and acellular pertussis (Tdap, Td) vaccine. You may need a Td booster every 10 years.  Zoster vaccine. You may need this after age 62.  Pneumococcal 13-valent conjugate (PCV13) vaccine. One dose is recommended after age 40.  Pneumococcal polysaccharide (PPSV23) vaccine. One dose is recommended after age 61. Talk to your health care provider about which screenings and vaccines you need and how often you need them. This information is not intended to replace advice given to you by your health care provider. Make sure you discuss any questions you have with your health care provider. Document Released: 02/16/2015 Document Revised: 10/10/2015 Document Reviewed: 11/21/2014 Elsevier Interactive Patient Education  2017 [employer] Prevention in the Home Falls can cause injuries. They can happen to people of all ages. There are many things you can do to make your home safe and to help prevent falls. What can I do on the outside of my home?  Regularly fix the edges of walkways and driveways and fix any cracks.  Remove anything that might make you trip as you walk through a door, such as a raised step or threshold.  Trim any bushes or trees on the path to your home.  Use bright outdoor lighting.  Clear any walking paths of anything that might make someone trip, such as rocks or tools.  Regularly check to see if handrails are loose or broken. Make sure that both sides of any steps have handrails.  Any raised decks and porches should have guardrails on the edges.  Have any leaves, snow, or ice cleared regularly.  Use sand or salt on walking paths during winter.  Clean up any spills in your garage right away. This includes oil or grease spills. What can I do in the bathroom?  Use night lights.  Install grab bars by the toilet and in the tub and shower. Do not use towel bars as grab  bars.  Use non-skid mats or decals in the tub or shower.  If you need to sit down in the shower, use a plastic, non-slip stool.  Keep the floor dry. Clean up any water that spills on the floor as soon as it happens.  Remove soap buildup in the tub or shower regularly.  Attach bath mats securely with double-sided non-slip rug tape.  Do not have throw rugs and other things on the floor that can make you trip. What can I do in the bedroom?  Use night lights.  Make sure that you have a light by your bed that is easy to reach.  Do not use any sheets or blankets that are too big for your bed. They should not hang down onto the floor.  Have a firm chair that has side arms. You can use this for support while you get dressed.  Do not have throw rugs and other things on the floor that can make you trip. What can I do in the kitchen?  Clean up any spills right away.  Avoid walking on wet floors.  Keep items that you use a lot in easy-to-reach places.  If you need to reach something above you, use a strong step stool  that has a grab bar.  Keep electrical cords out of the way.  Do not use floor polish or wax that makes floors slippery. If you must use wax, use non-skid floor wax.  Do not have throw rugs and other things on the floor that can make you trip. What can I do with my stairs?  Do not leave any items on the stairs.  Make sure that there are handrails on both sides of the stairs and use them. Fix handrails that are broken or loose. Make sure that handrails are as long as the stairways.  Check any carpeting to make sure that it is firmly attached to the stairs. Fix any carpet that is loose or worn.  Avoid having throw rugs at the top or bottom of the stairs. If you do have throw rugs, attach them to the floor with carpet tape.  Make sure that you have a light switch at the top of the stairs and the bottom of the stairs. If you do not have them, ask someone to add them for  you. What else can I do to help prevent falls?  Wear shoes that:  Do not have high heels.  Have rubber bottoms.  Are comfortable and fit you well.  Are closed at the toe. Do not wear sandals.  If you use a stepladder:  Make sure that it is fully opened. Do not climb a closed stepladder.  Make sure that both sides of the stepladder are locked into place.  Ask someone to hold it for you, if possible.  Clearly mark and make sure that you can see:  Any grab bars or handrails.  First and last steps.  Where the edge of each step is.  Use tools that help you move around (mobility aids) if they are needed. These include:  Canes.  Walkers.  Scooters.  Crutches.  Turn on the lights when you go into a dark area. Replace any light bulbs as soon as they burn out.  Set up your furniture so you have a clear path. Avoid moving your furniture around.  If any of your floors are uneven, fix them.  If there are any pets around you, be aware of where they are.  Review your medicines with your doctor. Some medicines can make you feel dizzy. This can increase your chance of falling. Ask your doctor what other things that you can do to help prevent falls. This information is not intended to replace advice given to you by your health care provider. Make sure you discuss any questions you have with your health care provider. Document Released: 11/16/2008 Document Revised: 06/28/2015 Document Reviewed: 02/24/2014 Elsevier Interactive Patient Education  2017 [employer].

## 2016-11-04 NOTE — Progress Notes (Signed)
Subjective:   Charles York is a 83 y.o. male who presents for Medicare Annual/Subsequent preventive examination.  Review of Systems:  N/A Cardiac Risk Factors include: advanced age (>28men, >7 women);diabetes mellitus;dyslipidemia;hypertension;male gender     Objective:    Vitals: BP 134/70 (BP Location: Left Arm, Patient Position: Sitting, Cuff Size: Normal)   Pulse 82   Temp 98.1 F (36.7 C) (Oral)   Resp 20   Ht 5\' 10"  (1.778 m)   Wt 190 lb (86.2 kg)   BMI 27.26 kg/m   Body mass index is 27.26 kg/m.  Tobacco History  Smoking Status  . Former Smoker  . Quit date: 02/04/1983  Smokeless Tobacco  . Never Used     Counseling given: Not Answered   Past Medical History:  Diagnosis Date  . Arthritis   . Colon polyp   . Diabetes mellitus without complication ([place])   . Heartburn   . Hyperlipemia   . Hypertension   . Obesity    Past Surgical History:  Procedure Laterality Date  . No surgical hx     Family History  Problem Relation Age of Onset  . Cancer Father        carcinoma  . Alzheimer's disease Mother    History  Sexual Activity  . Sexual activity: Not on file    Outpatient Encounter Prescriptions as of 11/04/2016  Medication Sig  . aspirin 81 MG tablet Take 81 mg by mouth daily.  Marland Kitchen atorvastatin (LIPITOR) 80 MG tablet Take 80 mg by mouth at bedtime. Pt take 1/2 pill daily at bed time  . insulin aspart (NOVOLOG) 100 UNIT/ML injection Inject into the skin 2 (two) times daily. Pt takes 12 units before breakfast and before supper.   . insulin glargine (LANTUS) 100 UNIT/ML injection Inject into the skin daily after breakfast. Pt takes 30 units once daily  . losartan (COZAAR) 25 MG tablet Take 25 mg by mouth daily.  Marland Kitchen omeprazole (PRILOSEC) 20 MG capsule TAKE 1 CAPSULE(20 MG) BY MOUTH DAILY  . vitamin B-12 (CYANOCOBALAMIN) 1000 MCG tablet Take 1,000 mcg by mouth daily.  . [DISCONTINUED] clindamycin (CLEOCIN) 300 MG capsule Take 1 capsule (300 mg total) by  mouth 4 (four) times daily. X 3 days, extended course  . [DISCONTINUED] fluticasone (FLONASE) 50 MCG/ACT nasal spray Place 2 sprays into both nostrils daily. Use for 4-6 weeks then stop and use seasonally or as needed.  . [DISCONTINUED] mupirocin ointment (BACTROBAN) 2 % Apply 1 application topically 2 (two) times daily. For 10-14 days  . [DISCONTINUED] ranitidine (ZANTAC) 150 MG tablet Take 150 mg by mouth 2 (two) times daily.   No facility-administered encounter medications on file as of 11/04/2016.     Activities of Daily Living In your present state of health, do you have any difficulty performing the following activities: 11/04/2016 07/15/2016  Hearing? Tempie Donning  Vision? N N  Difficulty concentrating or making decisions? N N  Walking or climbing stairs? N N  Dressing or bathing? N N  Doing errands, shopping? N N  Preparing Food and eating ? N -  Using the Toilet? N -  In the past six months, have you accidently leaked urine? N -  Do you have problems with loss of bowel control? N -  Managing your Medications? N -  Managing your Finances? N -  Housekeeping or managing your Housekeeping? N -  Some recent data might be hidden    Patient Care Team: [name], DO as  PCP - General (Family Medicine)   Assessment:     Exercise Activities and Dietary recommendations Current Exercise Habits: Home exercise routine, Type of exercise: strength training/weights;treadmill, Time (Minutes): 60, Frequency (Times/Week): 7, Weekly Exercise (Minutes/Week): 420, Intensity: Mild, Exercise limited by: None identified  Goals    . Increase water intake          Recommend eliminating [employer] and replacing with 4-6 glasses of water per day      Fall Risk Fall Risk  11/04/2016 07/15/2016 07/04/2015 07/04/2015  Falls in the past year? No No No No  Risk for fall due to : Impaired vision - - -  Risk for fall due to: Comment wears eye glasses - - -   Depression Screen PHQ 2/9 Scores  11/04/2016 07/15/2016 07/15/2016 07/04/2015  PHQ - 2 Score 2 0 0 4  PHQ- 9 Score 2 - - 12  Exception Documentation - Patient refusal - -    Cognitive Function     6CIT Screen 11/04/2016  What Year? 0 points  What month? 0 points  What time? 3 points  Count back from 20 0 points  Months in reverse 0 points  Repeat phrase 2 points  Total Score 5    Immunization History  Administered Date(s) Administered  . Influenza-Unspecified 10/29/2015  . Pneumococcal Conjugate-13 05/07/2016  . Zoster Recombinat (Shingrix) 05/07/2016   Screening Tests Health Maintenance  Topic Date Due  . INFLUENZA VACCINE  09/03/2016  . HEMOGLOBIN A1C  11/18/2016 (Originally 12/12/2015)  . OPHTHALMOLOGY EXAM  12/15/2016 (Originally 04/03/2016)  . FOOT EXAM  06/29/2017  . TETANUS/TDAP  02/04/2020  . PNA vac Low Risk Adult  Completed      Plan:   I have personally reviewed and addressed the Medicare Annual Wellness questionnaire and have noted the following in the patient's chart:  A. Medical and social history B. Use of alcohol, tobacco or illicit drugs  C. Current medications and supplements D. Functional ability and status E.  Nutritional status F.  Physical activity G. Advance directives H. List of other physicians I.  Hospitalizations, surgeries, and ER visits in previous 12 months J.  [place] such as hearing and vision if needed, cognitive and depression L. Referrals and appointments - none  In addition, I have reviewed and discussed with patient certain preventive protocols, quality metrics, and best practice recommendations. A written personalized care plan for preventive services as well as general preventive health recommendations were provided to patient.  See attached scanned questionnaire for additional information.   Signed,  Aleatha Borer, LPN Nurse Health Advisor   MD Recommendations: None. Due for repeat Hbg A1C

## 2016-11-18 ENCOUNTER — Encounter: Payer: Self-pay | Admitting: Family Medicine

## 2016-11-18 ENCOUNTER — Ambulatory Visit (INDEPENDENT_AMBULATORY_CARE_PROVIDER_SITE_OTHER): Payer: Medicare Other | Admitting: Family Medicine

## 2016-11-18 VITALS — BP 140/62 | HR 71 | Temp 98.0°F | Resp 16 | Ht 70.0 in | Wt 191.0 lb

## 2016-11-18 DIAGNOSIS — N183 Chronic kidney disease, stage 3 unspecified: Secondary | ICD-10-CM

## 2016-11-18 DIAGNOSIS — E1169 Type 2 diabetes mellitus with other specified complication: Secondary | ICD-10-CM | POA: Diagnosis not present

## 2016-11-18 DIAGNOSIS — K219 Gastro-esophageal reflux disease without esophagitis: Secondary | ICD-10-CM | POA: Diagnosis not present

## 2016-11-18 DIAGNOSIS — E1140 Type 2 diabetes mellitus with diabetic neuropathy, unspecified: Secondary | ICD-10-CM

## 2016-11-18 DIAGNOSIS — E785 Hyperlipidemia, unspecified: Secondary | ICD-10-CM | POA: Diagnosis not present

## 2016-11-18 DIAGNOSIS — E11319 Type 2 diabetes mellitus with unspecified diabetic retinopathy without macular edema: Secondary | ICD-10-CM | POA: Diagnosis not present

## 2016-11-18 LAB — POCT GLYCOSYLATED HEMOGLOBIN (HGB A1C): Hemoglobin A1C: 7 — AB (ref ?–5.7)

## 2016-11-18 LAB — BASIC METABOLIC PANEL WITH GFR
BUN/Creatinine Ratio: 16 (calc) (ref 6–22)
BUN: 22 mg/dL (ref 7–25)
CO2: 26 mmol/L (ref 20–32)
Calcium: 9.5 mg/dL (ref 8.6–10.3)
Chloride: 104 mmol/L (ref 98–110)
Creat: 1.38 mg/dL — ABNORMAL HIGH (ref 0.70–1.11)
GFR, Est African American: 54 mL/min/{1.73_m2} — ABNORMAL LOW (ref >=60)
GFR, Est Non African American: 47 mL/min/{1.73_m2} — ABNORMAL LOW (ref >=60)
Glucose, Bld: 158 mg/dL — ABNORMAL HIGH (ref 65–139)
Potassium: 4.3 mmol/L (ref 3.5–5.3)
Sodium: 138 mmol/L (ref 135–146)

## 2016-11-18 MED ORDER — OMEPRAZOLE 20 MG PO CPDR: 20.0000 mg | DELAYED_RELEASE_CAPSULE | Freq: Every day | ORAL | 3 refills | Status: DC

## 2016-11-18 NOTE — Progress Notes (Signed)
Subjective:    Patient ID: Charles York, male    DOB: 02-16-1930, 83 y.o.   MRN: [id number]  Charles York is a 83 y.o. male presenting on 11/18/2016 for Diabetes (ranges 150-160) and Fatigue   HPI   CHRONIC DM, Type 2, complicated by neuropathy, nephropathy, and retinopathy. Reports no new concerns, he has transferred his diabetic care to our office instead of going to [place], he does not like waiting there to be seen. Reported last A1c 8.0. CBGs: Avg 140-160, Low 70 (rarely, has symptoms of hypoglycemia, sweating blurry vision), High < 200. Checks CBGs 2x daily AM and PM. No written log available today Meds: Lantus 30 units in AM, Novolog with mealtime 12 units BID Reports good compliance. Tolerating well w/o side-effects Currently on ARB  Lifestyle: - Diet (continues to improve DM diet, no changes since last visit)  - Exercise (walking) - Previously followed by eye doctor at [facility], last visit 2 months ago, has history of DM Retinopathy in eyes, requesting local referral to ophthalmology, since he does not want to wait at the [facility] to see the doctor - CKD: Recent Cr trend elevated, last 1.7 in 07/2016 - Admits hypoglycemia as above, he can wake up overnight with sweats at times and feel low blood sugar, still not happening often but does happen few times month Denies hypoglycemia, polyuria, visual changes, numbness or tingling.  GERD - History of GERD, controlled on PPI omeprazole, now ran out request refill, seems symptoms are worse off medicine  HYPERLIPIDEMIA: - Reports no concerns. Last lipid panel unsure within past 1 year,  - Currently taking Atoravstatin 40mg  (HALF of 80mg  tab nightly), tolerating well without side effects or myalgias  Health Maintenance: - UTD Flu Shot this season - UTD Pneumonia vaccine  Depression screen [facility] 2/9 11/18/2016 11/04/2016 07/15/2016  Decreased Interest 0 1 0  Down, Depressed, Hopeless 0 1 0  PHQ - 2 Score 0 2 0  Altered sleeping - 0 -  Tired,  decreased energy - 0 -  Change in appetite - 0 -  Feeling bad or failure about yourself  - 0 -  Trouble concentrating - 0 -  Moving slowly or fidgety/restless - 0 -  Suicidal thoughts - 0 -  PHQ-9 Score - 2 -  Difficult doing work/chores - Not difficult at all -    Social History  Substance Use Topics  . Smoking status: Former Smoker    Quit date: 02/04/1983  . Smokeless tobacco: Never Used  . Alcohol use No    Review of Systems Per HPI unless specifically indicated above     Objective:    BP 140/62   Pulse 71   Temp 98 F (36.7 C) (Oral)   Resp 16   Ht 5\' 10"  (1.778 m)   Wt 191 lb (86.6 kg)   BMI 27.41 kg/m   Wt Readings from Last 3 Encounters:  11/18/16 191 lb (86.6 kg)  11/04/16 190 lb (86.2 kg)  10/07/16 189 lb 9.6 oz (86 kg)    Physical Exam  Constitutional: He is oriented to person, place, and time. He appears well-developed and well-nourished. No distress.  Well-appearing 83 year old elderly male, comfortable, cooperative  HENT:  Head: Normocephalic and atraumatic.  Mouth/Throat: Oropharynx is clear and moist.  Some mild difficulty with hearing  Eyes: Conjunctivae are normal. Right eye exhibits no discharge. Left eye exhibits no discharge.  Neck: Normal range of motion.  Cardiovascular: Normal rate, regular rhythm, normal heart  sounds and intact distal pulses.   No murmur heard. Pulmonary/Chest: Effort normal and breath sounds normal. No respiratory distress. He has no wheezes. He has no rales.  Musculoskeletal: Normal range of motion. He exhibits no edema (Trace to resolved today).  Neurological: He is alert and oriented to person, place, and time.  Skin: Skin is warm and dry. No rash noted. He is not diaphoretic. No erythema.  Psychiatric: He has a normal mood and affect. His behavior is normal.  Well groomed, good eye contact, normal speech and thoughts  Nursing note and vitals reviewed.    Recent Labs  11/18/16 0823  HGBA1C 7.0*    Results for  orders placed or performed in visit on 11/18/16  POCT HgB A1C  Result Value Ref Range   Hemoglobin A1C 7.0 (A) 5.7      Assessment & Plan:   Problem List Items Addressed This Visit    CKD (chronic kidney disease), stage III ([place])    CKD-III presumed secondary to age, DM, HTN Cr trend 1.5-1.7  Plan: 1. Check BMET again today now 3-4 months later to follow Cr trend, if still increasing may need referral Nephrology, he has not seen them before at [facility]. Concern with diabetes and insulin and declining renal function inc risk hypoglycemia      Relevant Orders   BASIC METABOLIC PANEL WITH GFR   Diabetic retinopathy ([place])    History of DM retinopathy, without further details, no record available from [place] Patient preference to switch ophthalmology to local instead of waiting at [facility] Referral placed to [facility], he is due for DM eye exam this year, no record from last visit 2 months ago      Relevant Orders   Ambulatory referral to Ophthalmology   GERD (gastroesophageal reflux disease)    Stable chronic history of GERD, now refractory off PPI Refill Omeprazole 20mg  daily      Relevant Medications   omeprazole (PRILOSEC) 20 MG capsule   Hyperlipidemia associated with type 2 diabetes mellitus (HCC)    On Statin Last lipid panel >1 year ago Calculated ASCVD 10 yr risk score elevated due to DM, age  Plan: 1. Continue current meds - Atorvastatin 40mg  (HALF tab 80mg )  2. Continue ASA 81mg  for primary ASCVD risk reduction 3. Encourage improved lifestyle - low carb/cholesterol, reduce portion size, continue improving regular exercise 4. Follow-up 07/2017 for annual with labs      Type 2 diabetes, controlled, with neuropathy ([place]) - Primary    Well-controlled DM with A1c 7.0 (improved from reported 8.0 previous), now transferred care from [place] do not have outside record Complications - CKD-III, peripheral neuropathy, DM retinopathy Hypoglycemia, occasional  Plan:  1. REDUCE  dose Novolog from 12u BID wc to 10u BID wc - given some hypoglycemia, reassuring that patient is sensitive to his hypoglycemia symptoms, but expressed my concern with declining kidney function may increase hypoglycemia on insulin - Continue Lantus 30u daily in AM for now 2. Encourage improved lifestyle - low carb, low sugar diet, reduce portion size, continue improving regular exercise 3. Check CBG, bring log to next visit for review 4. Continue ASA, ARB, Statin 5. [facility] DM Eye, fax record 6. Check BMET today for Cr trend with CKD 7. Follow-up 3 months DM A1c - may consider significant reducing insulin and switch to GLP1 in future if able to afford  due to cost      Relevant Orders   POCT HgB A1C (Completed)   Ambulatory referral to Ophthalmology      Meds ordered this encounter  Medications  . mupirocin ointment (BACTROBAN) 2 %    Sig: APP EXT AA BID FOR 10 TO 14 DAYS    Refill:  0  . omeprazole (PRILOSEC) 20 MG capsule    Sig: Take 1 capsule (20 mg total) by mouth daily before breakfast.    Dispense:  90 capsule    Refill:  3    Follow up plan: Return in about 3 months (around 02/18/2017) for DM A1c, CKD, HTN.  Nobie Putnam, DO [facility] 11/18/2016, 9:15 AM

## 2016-11-18 NOTE — Patient Instructions (Addendum)
Thank you for coming to the clinic today.  1. A1c 7.0, keep up the great work  Recommend insulin change from Novolog 12 units twice daily to 10 units twice daily  Cautious on low blood sugar, make sure you are ready in case it drops. If kidney function is reduced, then insulin will last longer.  Your provider would like to you have your annual eye exam. Please contact your current eye doctor or here are some good options for you to contact.   [facility] Address: [address] [place], [place] [address]  Phone: [phone]  Website: [email or web address]    Please schedule a Follow-up Appointment to: Return in about 3 months (around 02/18/2017) for DM A1c, CKD, HTN.  If you have any other questions or concerns, please feel free to call the clinic or send a message through [place]. You may also schedule an earlier appointment if necessary.  Additionally, you may be receiving a survey about your experience at our clinic within a few days to 1 week by e-mail or mail. We value your feedback.  Nobie Putnam, DO [place]

## 2016-11-18 NOTE — Assessment & Plan Note (Signed)
CKD-III presumed secondary to age, DM, HTN Cr trend 1.5-1.7  Plan: 1. Check BMET again today now 3-4 months later to follow Cr trend, if still increasing may need referral Nephrology, he has not seen them before at [facility]. Concern with diabetes and insulin and declining renal function inc risk hypoglycemia

## 2016-11-18 NOTE — Assessment & Plan Note (Addendum)
Well-controlled DM with A1c 7.0 (improved from reported 8.0 previous), now transferred care from [place] do not have outside record Complications - CKD-III, peripheral neuropathy, DM retinopathy Hypoglycemia, occasional  Plan:  1. REDUCE dose Novolog from 12u BID wc to 10u BID wc - given some hypoglycemia, reassuring that patient is sensitive to his hypoglycemia symptoms, but expressed my concern with declining kidney function may increase hypoglycemia on insulin - Continue Lantus 30u daily in AM for now 2. Encourage improved lifestyle - low carb, low sugar diet, reduce portion size, continue improving regular exercise 3. Check CBG, bring log to next visit for review 4. Continue ASA, ARB, Statin 5. [facility] to [facility] for DM Eye, fax record 6. Check BMET today for Cr trend with CKD 7. Follow-up 3 months DM A1c - may consider significant reducing insulin and switch to GLP1 in future if able to afford due to cost

## 2016-11-18 NOTE — Assessment & Plan Note (Signed)
History of DM retinopathy, without further details, no record available from [place] Patient preference to switch ophthalmology to local instead of waiting at [facility] Referral placed to [facility], he is due for DM eye exam this year, no record from last visit 2 months ago

## 2016-11-18 NOTE — Assessment & Plan Note (Signed)
Stable chronic history of GERD, now refractory off PPI Refill Omeprazole 20mg  daily

## 2016-11-18 NOTE — Assessment & Plan Note (Signed)
On Statin Last lipid panel >1 year ago Calculated ASCVD 10 yr risk score elevated due to DM, age  Plan: 1. Continue current meds - Atorvastatin 40mg  (HALF tab 80mg )  2. Continue ASA 81mg  for primary ASCVD risk reduction 3. Encourage improved lifestyle - low carb/cholesterol, reduce portion size, continue improving regular exercise 4. Follow-up 07/2017 for annual with labs

## 2017-02-24 ENCOUNTER — Ambulatory Visit (INDEPENDENT_AMBULATORY_CARE_PROVIDER_SITE_OTHER): Payer: Medicare Other | Admitting: Family Medicine

## 2017-02-24 ENCOUNTER — Other Ambulatory Visit: Payer: Self-pay | Admitting: Family Medicine

## 2017-02-24 ENCOUNTER — Encounter: Payer: Self-pay | Admitting: Family Medicine

## 2017-02-24 VITALS — BP 110/70 | HR 80 | Temp 98.0°F | Resp 16 | Ht 70.0 in | Wt 190.8 lb

## 2017-02-24 DIAGNOSIS — E785 Hyperlipidemia, unspecified: Secondary | ICD-10-CM

## 2017-02-24 DIAGNOSIS — I10 Essential (primary) hypertension: Secondary | ICD-10-CM

## 2017-02-24 DIAGNOSIS — M1990 Unspecified osteoarthritis, unspecified site: Secondary | ICD-10-CM

## 2017-02-24 DIAGNOSIS — E1140 Type 2 diabetes mellitus with diabetic neuropathy, unspecified: Secondary | ICD-10-CM | POA: Diagnosis not present

## 2017-02-24 DIAGNOSIS — K5900 Constipation, unspecified: Secondary | ICD-10-CM | POA: Diagnosis not present

## 2017-02-24 DIAGNOSIS — N183 Chronic kidney disease, stage 3 unspecified: Secondary | ICD-10-CM

## 2017-02-24 DIAGNOSIS — E1169 Type 2 diabetes mellitus with other specified complication: Secondary | ICD-10-CM

## 2017-02-24 DIAGNOSIS — Z0000 Encounter for general adult medical examination without abnormal findings: Secondary | ICD-10-CM

## 2017-02-24 DIAGNOSIS — E11319 Type 2 diabetes mellitus with unspecified diabetic retinopathy without macular edema: Secondary | ICD-10-CM

## 2017-02-24 LAB — POCT GLYCOSYLATED HEMOGLOBIN (HGB A1C): Hemoglobin A1C: 7.5 — AB (ref ?–5.7)

## 2017-02-24 MED ORDER — POLYETHYLENE GLYCOL 3350 17 GM/SCOOP PO POWD: 17.0000 g | Freq: Every day | ORAL | 1 refills | Status: DC | PRN

## 2017-02-24 NOTE — Assessment & Plan Note (Addendum)
Well-controlled HTN - Home BP readings controlled  Complication with CKD-III   Plan:  1. Continue current BP regimen - ARB Losartan 25mg  daily 2. Encourage improved lifestyle - low sodium diet, regular exercise 3. Continue monitor BP outside office, bring readings to next visit, if persistently >140/90 or new symptoms notify office sooner. Or if low BP < 100/70 and symptomatic 4. Follow-up 6 months for annual and labs

## 2017-02-24 NOTE — Progress Notes (Signed)
Subjective:    Patient ID: Charles York, male    DOB: [birth year], 84 y.o.   MRN: [id number]  Charles PREVETTE is a 84 y.o. male presenting on 02/24/2017 for Hypertension and Diabetes   HPI   CHRONIC DM, Type 2, complicated by neuropathy, nephropathy, and retinopathy. Reports recent concern 2 nights ago woke up around 3am with hypoglycemia felt a little nervous and slight sweat had CBG 58, did not eat as much supper or late night snack that time as per usual, that was a fluke. CBGs: Avg < 150, Low 58 (symptoms of hypoglycemia), High < 200. Checks CBGs 2x daily AM and PM. No written log available today Meds: Lantus 30 units in AM, Novolog with mealtime 10 units BID (recently lowered from 12) Reports good compliance. Tolerating well w/o side-effects Currently on ARB  Lifestyle: - Diet (continues to improve DM diet, no changes since last visit)  - Exercise (walking) - Followed by [facility] - he had DM Eye Exam in December 2018 and then had surgery done by [facility]. He could not establish with [facility] due to cost - CKD: Recent Cr trend elevated, last 1.7 in 07/2016 - Admits hypoglycemia as above, x 1 episode in 3 months, which is improved - Admits some tingling in feet stable without change - Denies worsening burning, skin ulcer, polyuria, hyperglycemia  CHRONIC HTN: Reports no new concerns, he monitors BP at home. Avg readings 110-130, in past had more elevated readings Current Meds - Losartan 25mg  daily   Reports good compliance, took meds today. Tolerating well, w/o complaints. Denies CP, dyspnea, HA, edema, dizziness / lightheadedness  Constipation Reports for past 2 weeks he has had problem with constipation, seemed to start after URI cold and took decongestant, stated he was "dry", he endorses constipation with less often BM, he was going once daily BM, then shifted to have more dry stools and smaller amounts, he tried Correctol Laxatiave (stool softener duloclax), took x 3 pills  with good result then no improve next few days, until took again, he tries to stay well hydrated. - Denies nausea vomiting diarrhea, abdominal pain or bloating, blood in stool or dark stool   Health Maintenance: UTD Flu Vaccine this season 10/2016, UTD PNA vaccine series Last reported colonoscopy in 2018 - at [facility]  Depression screen [facility] 2/9 02/24/2017 11/18/2016 11/04/2016  Decreased Interest 0 0 1  Down, Depressed, Hopeless 0 0 1  PHQ - 2 Score 0 0 2  Altered sleeping - - 0  Tired, decreased energy - - 0  Change in appetite - - 0  Feeling bad or failure about yourself  - - 0  Trouble concentrating - - 0  Moving slowly or fidgety/restless - - 0  Suicidal thoughts - - 0  PHQ-9 Score - - 2  Difficult doing work/chores - - Not difficult at all    Social History   Tobacco Use  . Smoking status: Former Smoker    Last attempt to quit: 02/04/1983    Years since quitting: 34.0  . Smokeless tobacco: Never Used  Substance Use Topics  . Alcohol use: No    Alcohol/week: 0.0 oz  . Drug use: No    Review of Systems Per HPI unless specifically indicated above     Objective:    BP 110/70   Pulse 80   Temp 98 F (36.7 C) (Oral)   Resp 16   Ht 5\' 10"  (1.778 m)   Wt 190 lb  12.8 oz (86.5 kg)   BMI 27.38 kg/m   Wt Readings from Last 3 Encounters:  02/24/17 190 lb 12.8 oz (86.5 kg)  11/18/16 191 lb (86.6 kg)  11/04/16 190 lb (86.2 kg)    Physical Exam  Constitutional: He is oriented to person, place, and time. He appears well-developed and well-nourished. No distress.  Well-appearing elderly 84 yr old male, comfortable, cooperative  HENT:  Head: Normocephalic and atraumatic.  Mouth/Throat: Oropharynx is clear and moist.  Eyes: Conjunctivae are normal. Right eye exhibits no discharge. Left eye exhibits no discharge.  Neck: Normal range of motion. Neck supple.  Cardiovascular: Normal rate, regular rhythm, normal heart sounds and intact distal pulses.  No murmur  heard. Pulmonary/Chest: Effort normal and breath sounds normal. No respiratory distress. He has no wheezes. He has no rales.  Abdominal: Soft. Bowel sounds are normal. He exhibits no distension and no mass. There is no tenderness. There is no rebound.  Musculoskeletal: Normal range of motion. He exhibits no edema.  Lymphadenopathy:    He has no cervical adenopathy.  Neurological: He is alert and oriented to person, place, and time.  Skin: Skin is warm and dry. No rash noted. He is not diaphoretic. No erythema.  Psychiatric: He has a normal mood and affect. His behavior is normal.  Well groomed, good eye contact, normal speech and thoughts  Nursing note and vitals reviewed.  Results for orders placed or performed in visit on 02/24/17  POCT HgB A1C  Result Value Ref Range   Hemoglobin A1C 7.5 (A) 5.7      Assessment & Plan:   Problem List Items Addressed This Visit    Constipation    Mild functional constipation, may be multifactorial dietary, less hydration with water and some diet sodas, also recent URI and oral decongestant seemed to trigger - PRN relief on stool softener  Plan: 1. Recommed start trial Miralax 17g x 1 cap daily for few days or may double dose, titrate to 1-3 soft BM daily, then use PRN again in future or daily 2. Stop stool softener 3. Improve hydration, limit soda 4. Increase fiber in diet 5. Follow-up if not improved      Relevant Medications   polyethylene glycol powder (GLYCOLAX/MIRALAX) powder   Essential hypertension    Well-controlled HTN - Home BP readings controlled  Complication with CKD-III   Plan:  1. Continue current BP regimen - ARB Losartan 25mg  daily 2. Encourage improved lifestyle - low sodium diet, regular exercise 3. Continue monitor BP outside office, bring readings to next visit, if persistently >140/90 or new symptoms notify office sooner. Or if low BP < 100/70 and symptomatic 4. Follow-up 6 months for annual and labs      Type 2  diabetes, controlled, with neuropathy ([place]) - Primary    Controlled DM with A1c 7.5 (up from 7.0) still within goal 7-8 Complications - CKD-III, peripheral neuropathy, DM retinopathy Hypoglycemia, occasional overnight - rare but had x 1 episode in past 3 months  Plan:  1. CONTINUE Novolog 10u BID wc - advised caution given some hypoglycemia, reassuring that patient is sensitive to his hypoglycemia symptoms, and reviewed risk - advised to ALWAYS have snack before bed, and have something available overnight if low sugar, and if any further episodes he should REDUCE PM dose Novolog from 10 to 8 or 6 units, and may adjust AM dose back to 12 only if needed - Continue Lantus 30u daily in AM for now 2. Encourage improved lifestyle -  low carb, low sugar diet, reduce portion size, continue improving regular exercise 3. Check CBG, bring log to next visit for review 4. Continue ASA, ARB, Statin 5. S/p DM eye exam per [facility] in 01/2017 and s/p vitreous injection procedure, he could not establish with [facility] due to cost/coverage, and we have requested record from [place] today 6. Follow-up 6 months Annual Phys + labs - return sooner within 3 months if recurrent hypoglycemia      Relevant Orders   POCT HgB A1C (Completed)      Meds ordered this encounter  Medications  . polyethylene glycol powder (GLYCOLAX/MIRALAX) powder    Sig: Take 17 g by mouth daily as needed for mild constipation or moderate constipation.    Dispense:  250 g    Refill:  1    Follow up plan: Return in about 6 months (around 08/24/2017) for Annual Physical.  Future labs placed for 08/2017  Nobie Putnam, [facility] 02/24/2017, 9:03 AM

## 2017-02-24 NOTE — Assessment & Plan Note (Signed)
Mild functional constipation, may be multifactorial dietary, less hydration with water and some diet sodas, also recent URI and oral decongestant seemed to trigger - PRN relief on stool softener  Plan: 1. Recommed start trial Miralax 17g x 1 cap daily for few days or may double dose, titrate to 1-3 soft BM daily, then use PRN again in future or daily 2. Stop stool softener 3. Improve hydration, limit soda 4. Increase fiber in diet 5. Follow-up if not improved

## 2017-02-24 NOTE — Patient Instructions (Addendum)
Thank you for coming to the office today.  1.  A1c 7.5 - increased a little from last time, 7.0  Try to always have a night-time snack to avoid low sugar overnight  If you get any further episodes of low sugars with symptoms, then we may need to adjust Novolog again, would recommend switching 2 to 4 units from PM dose to AM dose. Or may just reduce PM dose only.  2. Miralax rx 1-2 cap daily for few days then stop, may use as needed or daily maintenance  DUE for FASTING BLOOD WORK (no food or drink after midnight before the lab appointment, only water or coffee without cream/sugar on the morning of)  SCHEDULE "Lab Only" visit in the morning at the clinic for lab draw in 6 MONTHS   - Make sure Lab Only appointment is at about 1 week before your next appointment, so that results will be available  For Lab Results, once available within 2-3 days of blood draw, you can can log in to MyChart online to view your results and a brief explanation. Also, we can discuss results at next follow-up visit.    Please schedule a Follow-up Appointment to: Return in about 6 months (around 08/24/2017) for Annual Physical.    If you have any other questions or concerns, please feel free to call the office or send a message through [place]. You may also schedule an earlier appointment if necessary.  Additionally, you may be receiving a survey about your experience at our office within a few days to 1 week by e-mail or mail. We value your feedback.  Nobie Putnam, DO [place]

## 2017-02-24 NOTE — Assessment & Plan Note (Signed)
Controlled DM with A1c 7.5 (up from 7.0) still within goal 7-8 Complications - CKD-III, peripheral neuropathy, DM retinopathy Hypoglycemia, occasional overnight - rare but had x 1 episode in past 3 months  Plan:  1. CONTINUE Novolog 10u BID wc - advised caution given some hypoglycemia, reassuring that patient is sensitive to his hypoglycemia symptoms, and reviewed risk - advised to ALWAYS have snack before bed, and have something available overnight if low sugar, and if any further episodes he should REDUCE PM dose Novolog from 10 to 8 or 6 units, and may adjust AM dose back to 12 only if needed - Continue Lantus 30u daily in AM for now 2. Encourage improved lifestyle - low carb, low sugar diet, reduce portion size, continue improving regular exercise 3. Check CBG, bring log to next visit for review 4. Continue ASA, ARB, Statin 5. S/p DM eye exam per [facility] in 01/2017 and s/p vitreous injection procedure, he could not establish with [facility] due to cost/coverage, and we have requested record from [place] today 6. Follow-up 6 months Annual Phys + labs - return sooner within 3 months if recurrent hypoglycemia

## 2017-03-09 ENCOUNTER — Ambulatory Visit
Admission: RE | Admit: 2017-03-09 | Discharge: 2017-03-09 | Disposition: A | Discharge status: Home / Self Care | Payer: Medicare Other | Source: Ambulatory Visit | Attending: Family Medicine | Admitting: Family Medicine

## 2017-03-09 ENCOUNTER — Ambulatory Visit (INDEPENDENT_AMBULATORY_CARE_PROVIDER_SITE_OTHER): Payer: Medicare Other | Admitting: Family Medicine

## 2017-03-09 ENCOUNTER — Encounter: Payer: Self-pay | Admitting: Family Medicine

## 2017-03-09 VITALS — BP 144/63 | HR 70 | Temp 97.6°F | Resp 16 | Ht 70.0 in | Wt 195.0 lb

## 2017-03-09 DIAGNOSIS — K5900 Constipation, unspecified: Secondary | ICD-10-CM

## 2017-03-09 DIAGNOSIS — R1032 Left lower quadrant pain: Secondary | ICD-10-CM

## 2017-03-09 NOTE — Progress Notes (Signed)
Subjective:    Patient ID: Charles York, male    DOB: Mar 03, 1930, 84 y.o.   MRN: [id number]  Charles York is a 84 y.o. male presenting on 03/09/2017 for Constipation and Gastroesophageal Reflux   HPI   FOLLOW-UP CONSTIPATION - Last visit with me 02/24/17, for same problem discussed constipation at that time, treated with trial on miralax, see prior notes for background information. - Interval update with no significant improvement on miralax, tried for up to 1 week then stopped, he had better results on senna at bedtime for few days then would have BM - Today patient reports now still has problem with constipation. Seemed this is more recent problem over past >4-6 weeks, previously had better controlled bowel movements without problem in past, usual normal soft BM in AM after eating. - He stopped miralax after 1 week - Admits stool is harder and dry - Now seems bowels not regulated, feels more hungry - Last reported colonoscopy in 2018 - at [facility] Denies unintentional weight loss or gain, early satiety, nausea, vomiting, diarrhea, dark stool blood in stool  Depression screen [facility] 2/9 02/24/2017 11/18/2016 11/04/2016  Decreased Interest 0 0 1  Down, Depressed, Hopeless 0 0 1  PHQ - 2 Score 0 0 2  Altered sleeping - - 0  Tired, decreased energy - - 0  Change in appetite - - 0  Feeling bad or failure about yourself  - - 0  Trouble concentrating - - 0  Moving slowly or fidgety/restless - - 0  Suicidal thoughts - - 0  PHQ-9 Score - - 2  Difficult doing work/chores - - Not difficult at all    Social History   Tobacco Use  . Smoking status: Former Smoker    Last attempt to quit: 02/04/1983    Years since quitting: 34.1  . Smokeless tobacco: Never Used  Substance Use Topics  . Alcohol use: No    Alcohol/week: 0.0 oz  . Drug use: No    Review of Systems Per HPI unless specifically indicated above     Objective:    BP (!) 144/63   Pulse 70   Temp 97.6 F (36.4 C) (Oral)   Resp  16   Ht 5\' 10"  (1.778 m)   Wt 195 lb (88.5 kg)   BMI 27.98 kg/m   Wt Readings from Last 3 Encounters:  03/09/17 195 lb (88.5 kg)  02/24/17 190 lb 12.8 oz (86.5 kg)  11/18/16 191 lb (86.6 kg)    Physical Exam  Constitutional: He is oriented to person, place, and time. He appears well-developed and well-nourished. No distress.  Well-appearing elderly 84 yr old male, comfortable, cooperative  HENT:  Head: Normocephalic and atraumatic.  Mouth/Throat: Oropharynx is clear and moist.  Eyes: Conjunctivae are normal. Right eye exhibits no discharge. Left eye exhibits no discharge.  Neck: Normal range of motion. Neck supple.  Cardiovascular: Normal rate, regular rhythm, normal heart sounds and intact distal pulses.  No murmur heard. Pulmonary/Chest: Effort normal and breath sounds normal. No respiratory distress. He has no wheezes. He has no rales.  Abdominal: Soft. He exhibits no mass. There is no rebound.  Mild abdominal distention generalized, soft, non firm. Bowel sounds seem slightly reduced diffusely. Localized left lower abdominal quadrant mild discomfort on palpation deeper, but no focal abnormality or mass, no rebound or guarding. No RUQ or RLQ pain.  Musculoskeletal: He exhibits no edema.  Neurological: He is alert and oriented to person, place, and time.  Skin: Skin is warm and dry. No rash noted. He is not diaphoretic. No erythema.  Psychiatric: He has a normal mood and affect. His behavior is normal.  Well groomed, good eye contact, normal speech and thoughts  Nursing note and vitals reviewed.  I have personally reviewed the radiology report from 03/09/17 Abdominal X-ray KUB.  CLINICAL DATA:  84 year old male with chronic constipation worsening. Left lower quadrant abdominal pain. Initial encounter.  EXAM: ABDOMEN - 1 VIEW  COMPARISON:  None.  FINDINGS: Moderate stool ascending colon, transverse colon and proximal descending colon.  No abnormal gas distended small  bowel loops.  The possibility of free intraperitoneal air cannot be assessed on a supine view.  Mild degenerative changes lumbar spine.  Prior lumbar surgery.  Injection granulomas incidentally noted.  IMPRESSION: Moderate stool ascending colon, transverse colon and proximal descending colon.   Electronically Signed   By: Genia Del M.D.   On: 03/09/2017 20:09  Results for orders placed or performed in visit on 02/24/17  POCT HgB A1C  Result Value Ref Range   Hemoglobin A1C 7.5 (A) 5.7      Assessment & Plan:   Problem List Items Addressed This Visit    Constipation - Primary    Still has persistent functional constipation - PRN relief on stool softener, seems limited improvement on miralax  Plan: 1. Check KUB today - showed moderate stool burden across colon various locations - Proceed with adjust stool softeners again - try on higher dose Miralax x 2 doses daily vs BID, for up to 1 week maybe more, also add Senna regularly PRN as needed - Improve diet, fiber, hydration 2. If still not improved then reconsider return to GI may need repeat colonoscopy - will need local GI office, as plan to not return to [place]       Relevant Orders   DG Abd 1 View (Completed)    Other Visit Diagnoses    Intermittent left lower quadrant abdominal pain   Likely secondary to constipation See A&P      Relevant Medications   gabapentin (NEURONTIN) 100 MG capsule   Other Relevant Orders   DG Abd 1 View (Completed)      No orders of the defined types were placed in this encounter.     Follow up plan: Return if symptoms worsen or fail to improve, for abdominal pain, constipation.  Nobie Putnam, [facility] 03/09/2017, 10:35 PM

## 2017-03-09 NOTE — Patient Instructions (Addendum)
Thank you for coming to the office today.  1. Most likely still same issue with constipation  This could be related to diet as discussed  Recommend trial of Miralax again try TWICE daily one capful per dose for up to 1 week then reduce dose if need  May continue Senna since this is working better.  Improve hydration  Check abdominal X-ray today will call with results  If not improving can return to GI either at [facility] or we can do a local referral - call us and leave message  Please schedule a Follow-up Appointment to: Return if symptoms worsen or fail to improve, for abdominal pain, constipation.   If you have any other questions or concerns, please feel free to call the office or send a message through [place]. You may also schedule an earlier appointment if necessary.  Additionally, you may be receiving a survey about your experience at our office within a few days to 1 week by e-mail or mail. We value your feedback.  Nobie Putnam, DO [facility], [facility]   High-Fiber Diet Fiber, also called dietary fiber, is a type of carbohydrate found in fruits, vegetables, whole grains, and beans. A high-fiber diet can have many health benefits. Your health care provider may recommend a high-fiber diet to help:  Prevent constipation. Fiber can make your bowel movements more regular.  Lower your cholesterol.  Relieve hemorrhoids, uncomplicated diverticulosis, or irritable bowel syndrome.  Prevent overeating as part of a weight-loss plan.  Prevent heart disease, type 2 diabetes, and certain cancers.  What is my plan? The recommended daily intake of fiber includes:  38 grams for men under age 29.  44 grams for men over age 65.  13 grams for women under age 66.  63 grams for women over age 4.  You can get the recommended daily intake of dietary fiber by eating a variety of fruits, vegetables, grains, and beans. Your health care provider may also recommend a  fiber supplement if it is not possible to get enough fiber through your diet. What do I need to know about a high-fiber diet?  Fiber supplements have not been widely studied for their effectiveness, so it is better to get fiber through food sources.  Always check the fiber content on thenutrition facts label of any prepackaged food. Look for foods that contain at least 5 grams of fiber per serving.  Ask your dietitian if you have questions about specific foods that are related to your condition, especially if those foods are not listed in the following section.  Increase your daily fiber consumption gradually. Increasing your intake of dietary fiber too quickly may cause bloating, cramping, or gas.  Drink plenty of water. Water helps you to digest fiber. What foods can I eat? Grains Whole-grain breads. Multigrain cereal. Oats and oatmeal. Brown rice. Barley. Bulgur wheat. [place]. Bran muffins. Popcorn. Rye wafer crackers. Vegetables Sweet potatoes. Spinach. Kale. Artichokes. Cabbage. Broccoli. Green peas. Carrots. Squash. Fruits Berries. Pears. Apples. Oranges. Avocados. Prunes and raisins. Dried figs. Meats and Other Protein Sources [place], kidney, pinto, and soy beans. Split peas. Lentils. Nuts and seeds. Dairy Fiber-fortified yogurt. Beverages Fiber-fortified soy milk. Fiber-fortified orange juice. Other Fiber bars. The items listed above may not be a complete list of recommended foods or beverages. Contact your dietitian for more options. What foods are not recommended? Grains White bread. Pasta made with refined flour. White rice. Vegetables Fried potatoes. Canned vegetables. Well-cooked vegetables. Fruits Fruit juice. Cooked, strained fruit.  Meats and Other Protein Sources Fatty cuts of meat. Fried Sales executive or fried fish. Dairy Milk. Yogurt. Cream cheese. Sour cream. Beverages Soft drinks. Other Cakes and pastries. Butter and oils. The items listed above may not be a  complete list of foods and beverages to avoid. Contact your dietitian for more information. What are some tips for including high-fiber foods in my diet?  Eat a wide variety of high-fiber foods.  Make sure that half of all grains consumed each day are whole grains.  Replace breads and cereals made from refined flour or white flour with whole-grain breads and cereals.  Replace white rice with brown rice, bulgur wheat, or millet.  Start the day with a breakfast that is high in fiber, such as a cereal that contains at least 5 grams of fiber per serving.  Use beans in place of meat in soups, salads, or pasta.  Eat high-fiber snacks, such as berries, raw vegetables, nuts, or popcorn. This information is not intended to replace advice given to you by your health care provider. Make sure you discuss any questions you have with your health care provider. Document Released: 01/20/2005 Document Revised: 06/28/2015 Document Reviewed: 07/05/2013 Elsevier Interactive Patient Education  [employer].

## 2017-03-09 NOTE — Assessment & Plan Note (Signed)
Still has persistent functional constipation - PRN relief on stool softener, seems limited improvement on miralax  Plan: 1. Check KUB today - showed moderate stool burden across colon various locations - Proceed with adjust stool softeners again - try on higher dose Miralax x 2 doses daily vs BID, for up to 1 week maybe more, also add Senna regularly PRN as needed - Improve diet, fiber, hydration 2. If still not improved then reconsider return to GI may need repeat colonoscopy - will need local GI office, as plan to not return to [place]

## 2017-04-19 ENCOUNTER — Other Ambulatory Visit: Payer: Self-pay | Admitting: Nurse Practitioner

## 2017-07-10 ENCOUNTER — Telehealth: Payer: Self-pay | Admitting: Family Medicine

## 2017-07-10 NOTE — Telephone Encounter (Signed)
Left message for him to call back to schedule MWV w [facility] prior to physical appt w Dr Raliegh Ip.

## 2017-08-05 ENCOUNTER — Other Ambulatory Visit: Payer: Medicare Other

## 2017-08-12 ENCOUNTER — Encounter: Payer: Medicare Other | Admitting: Family Medicine

## 2017-10-15 ENCOUNTER — Telehealth: Payer: Self-pay | Admitting: Family Medicine

## 2017-10-15 NOTE — Telephone Encounter (Signed)
Pt hasn't been eating since son passed away and he is worried about his diabetes [phone]

## 2017-10-15 NOTE — Telephone Encounter (Signed)
The pt called complaining that he's not eating much since his son passed away on 06/02/22. He reports that his blood sugar reading are averaging this week 130- 160. His blood sugar this morning is 127. He  admits that he haven't taken his insulin since yesterday morning because he was nervous about taking his insulin not eating. Please advise

## 2017-10-15 NOTE — Telephone Encounter (Signed)
Patient advised as per Dr K. 

## 2017-10-15 NOTE — Telephone Encounter (Signed)
Reviewed his chart. His past A1c from 02/2017 was 7.5. He has been within range on his average sugar for a while.  He is at risk for hypoglycemia symptoms if he is not eating and taking his insulin.  ---------------------------------------------  I would recommend while poor appetite currently - he should HOLD the Novolog meal time insulin, and not use it at all.  If his fasting sugar is >150 in morning, then he may take REDUCED DOSE of Lantus daily insulin from 30 units DOWN TO 10 units that day.  If his fasting sugar is less than 150, then he should SKIP lantus completely that day.  If his appetite returns to normal, then he may resume prior insulin dosing.  He is due for an office visit in future for Diabetes, anytime in next 4 to 8 weeks he may schedule for Diabetes A1c follow-up.  If he needs any help or counseling after loss of his son, he may contact:  [facility] [address] [place], [place] [address] [place], [facility] 10/15/2017, 1:10 PM

## 2017-10-16 ENCOUNTER — Telehealth: Payer: Self-pay | Admitting: Family Medicine

## 2017-10-16 ENCOUNTER — Encounter: Payer: Self-pay | Admitting: Family Medicine

## 2017-10-16 ENCOUNTER — Other Ambulatory Visit: Payer: Self-pay | Admitting: Family Medicine

## 2017-10-16 DIAGNOSIS — F4320 Adjustment disorder, unspecified: Secondary | ICD-10-CM

## 2017-10-16 DIAGNOSIS — F4321 Adjustment disorder with depressed mood: Secondary | ICD-10-CM

## 2017-10-16 MED ORDER — SERTRALINE HCL 50 MG PO TABS
ORAL_TABLET | ORAL | 1 refills | Status: DC
Start: 2017-10-16 — End: 2017-10-26

## 2017-10-16 NOTE — Telephone Encounter (Signed)
When should pt come in for A1C?  He asked if medication for called in to help him sleep [phone]

## 2017-10-16 NOTE — Telephone Encounter (Signed)
Please call patient to find out what his symptoms are and if he has ever been on a medicine for mood or anxiety before.  Find out if it is for mood and depression or if it is for sleep insomnia?  I would recommend Sertraline (Zoloft) low dose to start, and we can adjust as needed. If he agrees I would send this in for adjustment or depression disorder.  Typically I would ask that he schedule an appointment to be seen BEFORE we rx the medication. However, given limited time today Friday afternoon and I will be out of office early next week. He may schedule to follow-up on this within next 1-2 weeks.  Nobie Putnam, [facility] 10/16/2017, 1:03 PM

## 2017-10-16 NOTE — Telephone Encounter (Signed)
Rx sent for Sertraline 50mg , start with half tab for 25mg  daily for 1-2 weeks in AM with food, can increase at that point 1-2 weeks if need to 1 whole tab 50mg  daily  Nobie Putnam, DO [facility] 10/16/2017, 3:54 PM

## 2017-10-16 NOTE — Addendum Note (Signed)
Addended by: [name] on: 10/16/2017 03:54 PM   Modules accepted: Orders

## 2017-10-16 NOTE — Telephone Encounter (Signed)
I spoke w/ the patient and he informed me that he is depressed and having issues with sleeping. Appointment scheduled to f/u with you on Sept 23rd @ 9:40pm. He would like for you to send the script over for Zoloft to his [employer].

## 2017-10-16 NOTE — Telephone Encounter (Signed)
Pt called need something called in  for the death of his son to [employer]

## 2017-10-26 ENCOUNTER — Encounter: Payer: Self-pay | Admitting: Family Medicine

## 2017-10-26 ENCOUNTER — Ambulatory Visit (INDEPENDENT_AMBULATORY_CARE_PROVIDER_SITE_OTHER): Payer: Medicare Other | Admitting: Family Medicine

## 2017-10-26 VITALS — BP 136/65 | HR 64 | Temp 98.5°F | Resp 16 | Ht 70.0 in | Wt 187.0 lb

## 2017-10-26 DIAGNOSIS — R195 Other fecal abnormalities: Secondary | ICD-10-CM | POA: Diagnosis not present

## 2017-10-26 DIAGNOSIS — K5900 Constipation, unspecified: Secondary | ICD-10-CM | POA: Diagnosis not present

## 2017-10-26 DIAGNOSIS — F4323 Adjustment disorder with mixed anxiety and depressed mood: Secondary | ICD-10-CM | POA: Diagnosis not present

## 2017-10-26 MED ORDER — ESCITALOPRAM OXALATE 10 MG PO TABS: ORAL_TABLET | ORAL | 2 refills | Status: DC

## 2017-10-26 NOTE — Patient Instructions (Addendum)
Thank you for coming to the office today.  STOP Zoloft  Start new Escitalopram today - take HALF tablet with food daily in AM. After 1-2 weeks increase up to whole tablet as tolerated, continue for several weeks to months. Notify if need dose change.  In future if need medicine for sleep we can help with that as well.  If need more help with therapy or counseling let us know, otherwise continue Bereavement Counseling through [place] a copy of your last Diabetic Eye Exam from [place] for our records  Referral to GI specialist locally - stay tuned for apt.  [facility]) [address] [place], [place] [address] Phone: [phone]  Please schedule a Follow-up Appointment to: Return in about 3 months (around 01/25/2018) for DM A1c , Adjustment Depression / Med / f/u GI results.  If you have any other questions or concerns, please feel free to call the office or send a message through [place]. You may also schedule an earlier appointment if necessary.  Additionally, you may be receiving a survey about your experience at our office within a few days to 1 week by e-mail or mail. We value your feedback.  Nobie Putnam, DO [place]

## 2017-10-26 NOTE — Progress Notes (Signed)
Subjective:    Patient ID: Charles York, male    DOB: [birth year], 84 y.o.   MRN: [id number]  Charles York is a 84 y.o. male presenting on 10/26/2017 for Depression (improved)   HPI   FOLLOW-UP ACUTE ADJUSTMENT DISORDER WITH DEPRESSION / ANXIETY Reports symptoms onset now for about 2 weeks after passing of his son. He has had a complicated bereavement with significant symptoms of depression and anxiety as a result. No prior history of depression or mood disorder previously. He was given Zoloft low dose empirically by phone by me earlier this month at dose 50mg , but he was instructed to cut in half for 25mg  daily at first, before he could make it in to office. - Interval updates now he reports he has tried Zoloft for several days to >1 week and he could not tolerate it well, he felt it was "too strong" and made him feel "not right" and too "tired" and "out of it". He stopped this med now, has not taken a dose today. - he has already talked to hospice grief counseling / bereavement - with some improvement - Admits he is able to sleep well at night, not having insomnia - Admits sometimes feels "restless" or anxious, rarely had episode of hyperventilation For ROS see PHQ score below Denies panic attack, chest pain, dyspnea  FOLLOW-UP Constipation / Pale Stool Last seen 03/2017 for similar problem with constipation, see prior note He was treated with KUB x-ray and showed moderate stool burden, advised on dosing miralax dosing. [facility] and had [facility] reported in 2018. Now he no longer plans to return to [place], he would like local GI provider. Today still admits issue with pale stool, now less constipation. Seems episodic Stool gets dark when he takes pepto appropriately Denies any new complaints, abdominal pain, nausea vomiting, diarrhea, fever chills   Depression screen [facility] 2/9 10/26/2017 02/24/2017 11/18/2016  Decreased Interest 2 0 0  Down, Depressed,  Hopeless 2 0 0  PHQ - 2 Score 4 0 0  Altered sleeping 1 - -  Tired, decreased energy 3 - -  Change in appetite 3 - -  Feeling bad or failure about yourself  2 - -  Trouble concentrating 1 - -  Moving slowly or fidgety/restless 2 - -  Suicidal thoughts 0 - -  PHQ-9 Score 16 - -  Difficult doing work/chores Not difficult at all - -   No flowsheet data found.   Social History   Tobacco Use  . Smoking status: Former Smoker    Last attempt to quit: 02/04/1983    Years since quitting: 34.7  . Smokeless tobacco: Former Network engineer Use Topics  . Alcohol use: No    Alcohol/week: 0.0 standard drinks  . Drug use: No    Review of Systems Per HPI unless specifically indicated above     Objective:    BP 136/65   Pulse 64   Temp 98.5 F (36.9 C) (Oral)   Resp 16   Ht 5\' 10"  (1.778 m)   Wt 187 lb (84.8 kg)   BMI 26.83 kg/m   Wt Readings from Last 3 Encounters:  10/26/17 187 lb (84.8 kg)  03/09/17 195 lb (88.5 kg)  02/24/17 190 lb 12.8 oz (86.5 kg)    Physical Exam  Constitutional: He is oriented to person, place, and time. He appears well-developed and well-nourished. No distress.  Well-appearing, comfortable, cooperative  HENT:  Head: Normocephalic  and atraumatic.  Mouth/Throat: Oropharynx is clear and moist.  Eyes: Conjunctivae are normal. Right eye exhibits no discharge. Left eye exhibits no discharge.  Cardiovascular: Normal rate.  Pulmonary/Chest: Effort normal.  Musculoskeletal: He exhibits no edema.  Neurological: He is alert and oriented to person, place, and time.  Skin: Skin is warm and dry. No rash noted. He is not diaphoretic. No erythema.  Psychiatric: His behavior is normal.  Well groomed, good eye contact, normal speech and thoughts. Mood appears positive and appropriate. No crying spell. Conversational.  Nursing note and vitals reviewed.      Assessment & Plan:   Problem List Items Addressed This Visit    Constipation   Relevant Orders    Ambulatory referral to Gastroenterology    Other Visit Diagnoses    Acute adjustment disorder with mixed anxiety and depressed mood    -  Primary  Suspected diagnosis likely more acute adjustment related with loss of son as trigger, no prior background history to support major depression or GAD at this time. Gradually improving Failed initial SSRI low dose Sertraline - No prior dx / Psych Has Hospice bereavement counseling  Plan: 1. Remain off Zoloft - stopped today already 2. Start Escitalopram 5mg  (HALF OF 10mg  TAB) daily AM with food, counseling on potential side effects risks, reviewed possible GI intolerance, insomnia (although likely to improve this given anxiety likely source of insomnia) - anticipate 4-6 weeks for notable effect, may need titrate dose in future 3. Advised recommend therapy / counseling in future - continue hospice counseling 4. Follow-up 3 months mood/anxiety, med adjust, GAD7/PHQ9     Relevant Medications   escitalopram (LEXAPRO) 10 MG tablet   Pale stool      Clinically has variety of GI symptoms with prior constipation and function GI complaints, prior X-ray showed moderate stool burden. Now has persistent pale stool. No recent lab abnormalities to support this.  Requested 2nd opinion referral to [facility] locally, [facility], last colonoscopy 2018 by report, do not have copy available.    Relevant Orders   Ambulatory referral to Gastroenterology      Meds ordered this encounter  Medications  . escitalopram (LEXAPRO) 10 MG tablet    Sig: Start with half tablet (dose 5mg ) daily in morning for 1-2 weeks then increase up to 1 whole tablet as needed.    Dispense:  30 tablet    Refill:  2    Follow up plan: Return in about 3 months (around 01/25/2018) for DM A1c , Adjustment Depression / Med / f/u GI results.  Nobie Putnam, [facility] 10/26/2017, 1:19 PM

## 2017-10-28 ENCOUNTER — Encounter: Payer: Self-pay | Admitting: Gastroenterology

## 2017-10-28 ENCOUNTER — Ambulatory Visit (INDEPENDENT_AMBULATORY_CARE_PROVIDER_SITE_OTHER): Payer: Medicare Other | Admitting: Gastroenterology

## 2017-10-28 VITALS — BP 136/60 | HR 64 | Ht 70.0 in | Wt 187.0 lb

## 2017-10-28 DIAGNOSIS — R195 Other fecal abnormalities: Secondary | ICD-10-CM

## 2017-10-28 NOTE — Progress Notes (Signed)
Vonda Antigua [address]  [place]  [place], [place] [address]  Main: [phone]  Fax: [phone]   Gastroenterology Consultation  Referring Provider:     Nobie Putnam * Primary Care Physician:  [name], DO Primary Gastroenterologist:  Dr. Vonda Antigua Reason for Consultation:     Pale stool        HPI:    Chief Complaint  Patient presents with  . New Patient (Initial Visit)    referred by A. Parks Ranger, DO-no diarrhea but stool is cream color. Has indigestion daily. Had constipation in Feb. and March    Charles York is a 84 y.o. y/o male referred for consultation & management  by Dr. Parks Ranger, Devonne Doughty, DO.  84 year old male reports pale stools since March 2019.  States it is clay colored.  No blood, no melena.  No weight loss.  No abdominal pain.  Denies any constipation.  Reports one soft bowel movements a day without straining.  No changes in the color of his urine.  No jaundice, no scleral icterus.  No hepatotoxic drugs.  No alcohol use.  Denies any family history of colon cancer.  Reports history of a colonoscopy in 2018 by the [place]. does not recall the results, states he has history of polyps and gets a colonoscopy every 5 years at the [place].  We do not have records of the procedure.  No prior EGDs.    Past Medical History:  Diagnosis Date  . Arthritis   . Colon polyp   . Heartburn   . Hyperlipemia   . Hypertension   . Obesity     Past Surgical History:  Procedure Laterality Date  . No surgical hx      Prior to Admission medications   Medication Sig Start Date End Date Taking? Authorizing Provider  atorvastatin (LIPITOR) 80 MG tablet Take 80 mg by mouth at bedtime. Pt take 1/2 pill daily at bed time   Yes [provider]  escitalopram (LEXAPRO) 10 MG tablet Start with half tablet (dose 5mg ) daily in morning for 1-2 weeks then increase up to 1 whole tablet as needed. 10/26/17  Yes Karamalegos,  Devonne Doughty, DO  insulin aspart (NOVOLOG) 100 UNIT/ML injection Inject into the skin 2 (two) times daily. Pt takes 12 units before breakfast and before supper.    Yes [provider]  insulin glargine (LANTUS) 100 UNIT/ML injection Inject into the skin daily after breakfast. Pt takes 30 units once daily   Yes [provider]  losartan (COZAAR) 25 MG tablet Take 25 mg by mouth daily.   Yes [provider]  mupirocin ointment (BACTROBAN) 2 % APP EXT AA BID FOR 10 TO 14 DAYS 09/25/16  Yes [provider]  ranitidine (ZANTAC) 75 MG tablet Take 75 mg by mouth 2 (two) times daily.   Yes [provider]  vitamin B-12 (CYANOCOBALAMIN) 1000 MCG tablet Take 1,000 mcg by mouth daily.   Yes [provider]  aspirin 81 MG tablet Take 81 mg by mouth daily.    [provider]  gabapentin (NEURONTIN) 100 MG capsule TAKE ONE CAPSULE BY MOUTH THREE TIMES DAILY Patient not taking: Reported on 10/28/2017 04/19/17   [name], DO  omeprazole (PRILOSEC) 20 MG capsule Take 1 capsule (20 mg total) by mouth daily before breakfast. Patient not taking: Reported on 10/28/2017 11/18/16   [name], DO  polyethylene glycol powder (GLYCOLAX/MIRALAX) powder Take 17 g by mouth daily as  needed for mild constipation or moderate constipation. Patient not taking: Reported on 10/28/2017 02/24/17   [name], DO    Family History  Problem Relation Age of Onset  . Cancer Father        carcinoma  . Alzheimer's disease Mother      Social History   Tobacco Use  . Smoking status: Former Smoker    Last attempt to quit: 02/04/1983    Years since quitting: 34.7  . Smokeless tobacco: Former Network engineer Use Topics  . Alcohol use: No    Alcohol/week: 0.0 standard drinks  . Drug use: No    Allergies as of 10/28/2017 - Review Complete 10/28/2017  Allergen Reaction Noted  . Penicillins Rash 06/26/2014    Review of Systems:      All systems reviewed and negative except where noted in HPI.   Physical Exam:  BP 136/60   Pulse 64   Ht 5\' 10"  (1.778 m)   Wt 187 lb (84.8 kg)   BMI 26.83 kg/m  No LMP for male patient. Psych:  Alert and cooperative. Normal mood and affect. General:   Alert,  Well-developed, well-nourished, pleasant and cooperative in NAD Head:  Normocephalic and atraumatic. Eyes:  Sclera clear, no icterus.   Conjunctiva pink. Ears:  Normal auditory acuity. Nose:  No deformity, discharge, or lesions. Mouth:  No deformity or lesions,oropharynx pink & moist. Neck:  Supple; no masses or thyromegaly. Abdomen:  Normal bowel sounds.  No bruits.  Soft, non-tender and non-distended without masses, hepatosplenomegaly or hernias noted.  No guarding or rebound tenderness.    Msk:  Symmetrical without gross deformities. Good, equal movement & strength bilaterally. Pulses:  Normal pulses noted. Extremities:  No clubbing or edema.  No cyanosis. Neurologic:  Alert and oriented x3;  grossly normal neurologically. Skin:  Intact without significant lesions or rashes. No jaundice. Lymph Nodes:  No significant cervical adenopathy. Psych:  Alert and cooperative. Normal mood and affect.   Labs: CBC    Component Value Date/Time   WBC 7.2 07/15/2016 1111   RBC 4.17 (L) 07/15/2016 1111   HGB 13.4 07/15/2016 1111   HCT 39.6 07/15/2016 1111   PLT 240 07/15/2016 1111   MCV 95.0 07/15/2016 1111   MCH 32.1 07/15/2016 1111   MCHC 33.8 07/15/2016 1111   RDW 13.3 07/15/2016 1111   LYMPHSABS 2,376 07/15/2016 1111   MONOABS 504 07/15/2016 1111   EOSABS 144 07/15/2016 1111   BASOSABS 72 07/15/2016 1111   CMP     Component Value Date/Time   NA 138 11/18/2016 0835   K 4.3 11/18/2016 0835   CL 104 11/18/2016 0835   CO2 26 11/18/2016 0835   GLUCOSE 158 (H) 11/18/2016 0835   BUN 22 11/18/2016 0835   CREATININE 1.38 (H) 11/18/2016 0835   CALCIUM 9.5 11/18/2016 0835   PROT 6.6 07/15/2016 1111   ALBUMIN 3.9  07/15/2016 1111   AST 12 07/15/2016 1111   ALT 12 07/15/2016 1111   ALKPHOS 91 07/15/2016 1111   BILITOT 0.4 07/15/2016 1111   GFRNONAA 47 (L) 11/18/2016 0835   GFRAA 54 (L) 11/18/2016 0835    Imaging Studies: Abdominal ultrasound June 2017, normal common bile duct, normal gallbladder, no focal hepatic abnormality.  Assessment and Plan:   Charles York is a 84 y.o. y/o male has been referred for change in color of stool  Change in color of stool can come from biliary duct obstruction, but patient has no other signs of  biliary duct obstruction No jaundice, no abdominal pain, no change in color of urine, no scleral icterus We will check liver enzymes and right upper quadrant ultrasound to evaluate for any biliary duct obstruction  Patient asked to avoid hepatotoxic drugs No other alarm symptoms present Patient denies any constipation  Dr Vonda Antigua

## 2017-10-28 NOTE — Patient Instructions (Signed)
F/U 3 months.     Ultrasound scheduled for 10/30/2017 (Friday) at the [facility], at 8:45 am, arrival time 8:30 am. Nothing to eat or drink after midnight. Contact: [phone] if you need to reschedule.

## 2017-10-29 LAB — COMPREHENSIVE METABOLIC PANEL
ALT: 25 IU/L (ref 0–44)
AST: 18 IU/L (ref 0–40)
Albumin/Globulin Ratio: 1.8 (ref 1.2–2.2)
Albumin: 4.5 g/dL (ref 3.5–4.7)
Alkaline Phosphatase: 89 IU/L (ref 39–117)
BUN/Creatinine Ratio: 19 (ref 10–24)
BUN: 24 mg/dL (ref 8–27)
Bilirubin Total: 0.4 mg/dL (ref 0.0–1.2)
CO2: 22 mmol/L (ref 20–29)
Calcium: 9.8 mg/dL (ref 8.6–10.2)
Chloride: 103 mmol/L (ref 96–106)
Creatinine, Ser: 1.25 mg/dL (ref 0.76–1.27)
GFR calc Af Amer: 60 mL/min/{1.73_m2} (ref >59)
GFR calc non Af Amer: 52 mL/min/{1.73_m2} — ABNORMAL LOW (ref >59)
Globulin, Total: 2.5 g/dL (ref 1.5–4.5)
Glucose: 132 mg/dL — ABNORMAL HIGH (ref 65–99)
Potassium: 5.1 mmol/L (ref 3.5–5.2)
Sodium: 139 mmol/L (ref 134–144)
Total Protein: 7 g/dL (ref 6.0–8.5)

## 2017-10-29 LAB — CBC
Hematocrit: 39.6 % (ref 37.5–51.0)
Hemoglobin: 13.2 g/dL (ref 13.0–17.7)
MCH: 31.7 pg (ref 26.6–33.0)
MCHC: 33.3 g/dL (ref 31.5–35.7)
MCV: 95 fL (ref 79–97)
Platelets: 248 10*3/uL (ref 150–450)
RBC: 4.16 x10E6/uL (ref 4.14–5.80)
RDW: 12.1 % — ABNORMAL LOW (ref 12.3–15.4)
WBC: 9.8 10*3/uL (ref 3.4–10.8)

## 2017-10-30 ENCOUNTER — Ambulatory Visit
Admission: RE | Admit: 2017-10-30 | Discharge: 2017-10-30 | Disposition: A | Discharge status: Home / Self Care | Payer: Medicare Other | Source: Ambulatory Visit | Attending: Gastroenterology | Admitting: Gastroenterology

## 2017-10-30 DIAGNOSIS — R195 Other fecal abnormalities: Secondary | ICD-10-CM

## 2017-10-30 DIAGNOSIS — K8020 Calculus of gallbladder without cholecystitis without obstruction: Secondary | ICD-10-CM | POA: Diagnosis not present

## 2017-11-02 ENCOUNTER — Telehealth: Payer: Self-pay

## 2017-11-02 NOTE — Telephone Encounter (Signed)
-----   Message from Virgel Manifold, MD sent at 10/30/2017  1:10 PM EDT ----- Jackelyn Poling please let patient know, his blood work does not show any evidence of biliary obstruction.  We are awaiting the ultrasound.

## 2017-11-03 HISTORY — PX: EYE SURGERY: SHX253

## 2017-11-10 ENCOUNTER — Ambulatory Visit: Payer: Medicare Other

## 2017-11-10 NOTE — Telephone Encounter (Signed)
Pt sent message via MyChart 

## 2017-11-12 DIAGNOSIS — K8010 Calculus of gallbladder with chronic cholecystitis without obstruction: Secondary | ICD-10-CM | POA: Diagnosis not present

## 2017-12-17 DIAGNOSIS — K8010 Calculus of gallbladder with chronic cholecystitis without obstruction: Secondary | ICD-10-CM | POA: Diagnosis not present

## 2018-01-19 ENCOUNTER — Ambulatory Visit: Payer: Medicare Other | Admitting: Gastroenterology

## 2018-01-22 ENCOUNTER — Other Ambulatory Visit: Payer: Self-pay

## 2018-01-22 ENCOUNTER — Encounter
Admission: RE | Admit: 2018-01-22 | Discharge: 2018-01-22 | Disposition: A | Discharge status: Home / Self Care | Payer: Medicare Other | Source: Ambulatory Visit | Attending: Surgery | Admitting: Surgery

## 2018-01-22 DIAGNOSIS — Z01818 Encounter for other preprocedural examination: Secondary | ICD-10-CM | POA: Insufficient documentation

## 2018-01-22 DIAGNOSIS — I10 Essential (primary) hypertension: Secondary | ICD-10-CM | POA: Diagnosis not present

## 2018-01-22 HISTORY — DX: Chronic kidney disease, unspecified: N18.9

## 2018-01-22 HISTORY — DX: Gastro-esophageal reflux disease without esophagitis: K21.9

## 2018-01-22 HISTORY — DX: Malignant (primary) neoplasm, unspecified: C80.1

## 2018-01-22 NOTE — Patient Instructions (Signed)
Your procedure is scheduled on: Thurs. 1/2 Report to Day Surgery. To find out your arrival time please call [phone] between 1PM - 3PM on  Tues..10/31  Remember: Instructions that are not followed completely may result in serious medical risk,  up to and including death, or upon the discretion of your surgeon and anesthesiologist your  surgery may need to be rescheduled.     _X__ 1. Do not eat food after midnight the night before your procedure.                 No gum chewing or hard candies. You may drink clear liquids up to 2 hours                 before you are scheduled to arrive for your surgery- DO not drink clear                 liquids within 2 hours of the start of your surgery.                 Clear Liquids include:  water, Black Coffee or Tea (Do not add                 anything to coffee or tea).  __X__2.  On the morning of surgery brush your teeth with toothpaste and water, you                may rinse your mouth with mouthwash if you wish.  Do not swallow any toothpaste of mouthwash.     ___ 3.  No Alcohol for 24 hours before or after surgery.   ___ 4.  Do Not Smoke or use e-cigarettes For 24 Hours Prior to Your Surgery.                 Do not use any chewable tobacco products for at least 6 hours prior to                 surgery.  ____  5.  Bring all medications with you on the day of surgery if instructed.   __x__  6.  Notify your doctor if there is any change in your medical condition      (cold, fever, infections).     Do not wear jewelry, make-up, hairpins, clips or nail polish. Do not wear lotions, powders, or perfumes. You may wear deodorant. Do not shave 48 hours prior to surgery. Men may shave face and neck. Do not bring valuables to the hospital.    [facility] is not responsible for any belongings or valuables.  Contacts, dentures or bridgework may not be worn into surgery. Leave your suitcase in the car. After surgery it may  be brought to your room. For patients admitted to the hospital, discharge time is determined by your treatment team.   Patients discharged the day of surgery will not be allowed to drive home.   Please read over the following fact sheets that you were given:    _x___ Take these medicines the morning of surgery with A SIP OF WATER:    1. ranitidine (ZANTAC) 150 MG tablet  2. Tylenol if needed  3.   4.  5.  6.  ____ Fleet Enema (as directed)   __x__ Use CHG Soap as directed  ____ Use inhalers on the day of surgery  ____ Stop metformin 2 days prior to surgery    __x__ Take 1/2 of usual insulin dose the night  before surgery. No insulin the morning          of surgery.   ____ Stop Coumadin/Plavix/aspirin on   ____ Stop Anti-inflammatories on    ____ Stop supplements until after surgery.    ____ Bring C-Pap to the hospital.

## 2018-01-22 NOTE — Pre-Procedure Instructions (Signed)
Dr. Hassell Done will put orders in this afternoon.

## 2018-01-25 ENCOUNTER — Ambulatory Visit: Payer: Medicare Other | Admitting: Family Medicine

## 2018-01-29 ENCOUNTER — Ambulatory Visit (HOSPITAL_COMMUNITY): Payer: Self-pay | Admitting: Surgery

## 2018-01-31 NOTE — H&P (Signed)
Chief Complaint:  Chronic cholecystitis  History of Present Illness:  Charles York is an 84 y.o. male who has been seen twice in the office for recurrent attacks of right upper quadrant pain and ultrasound consistent with cholecystitis.  In September Dr. Joneen Caraway read: [facility] Kitchen Cholelithiasis. 2. Poorly distended gallbladder with diffuse wall thickening. The wall thickening is most likely due to chronic cholecystitis rather than acute cholecystitis since there is no pericholecystic fluid or sonographic Murphy sign. 3. No biliary ductal dilatation.  After following this for a few weeks he returned to the office and requested cholecystectomy  Past Medical History:  Diagnosis Date  . Arthritis   . Cancer ([place])    skin  . Chronic kidney disease    stage 3  . Colon polyp   . Diabetes mellitus without complication ([place])   . GERD (gastroesophageal reflux disease)   . Heartburn   . Hyperlipemia   . Hypertension   . Obesity     Past Surgical History:  Procedure Laterality Date  . EYE SURGERY     fluid in back of eye/ injection every 3 months  . No surgical hx      No current facility-administered medications for this encounter.    Current Outpatient Medications  Medication Sig Dispense Refill  . atorvastatin (LIPITOR) 80 MG tablet Take 40 mg by mouth at bedtime. Pt take 1/2 pill daily at bed time     . hydroxypropyl methylcellulose / hypromellose (ISOPTO TEARS / GONIOVISC) 2.5 % ophthalmic solution Place 1 drop into both eyes 4 (four) times daily.    . insulin aspart (NOVOLOG) 100 UNIT/ML injection Inject 12 Units into the skin 2 (two) times daily.     . insulin glargine (LANTUS) 100 UNIT/ML injection Inject 25 Units into the skin daily after breakfast.     . losartan (COZAAR) 25 MG tablet Take 25 mg by mouth daily.    . ranitidine (ZANTAC) 150 MG tablet Take 150 mg by mouth 2 (two) times daily.     . vitamin B-12 (CYANOCOBALAMIN) 1000 MCG tablet Take 1,000 mcg by mouth daily.    .  polyethylene glycol powder (GLYCOLAX/MIRALAX) powder Take 17 g by mouth daily as needed for mild constipation or moderate constipation. 250 g 1   [place] Family History  Problem Relation Age of Onset  . Cancer Father        carcinoma  . Alzheimer's disease Mother    Social History:   reports that he quit smoking about 35 years ago. He has never used smokeless tobacco. He reports that he does not drink alcohol or use drugs.   REVIEW OF SYSTEMS : Negative except for see problem list  Physical Exam:   There were no vitals taken for this visit. There is no height or weight on file to calculate BMI.  Gen:  WDWN WM NAD  Neurological: Alert and oriented to person, place, and time. Motor and sensory function is grossly intact  Head: Normocephalic and atraumatic.  Eyes: Conjunctivae are normal. Pupils are equal, round, and reactive to light. No scleral icterus.  Neck: Normal range of motion. Neck supple. No tracheal deviation or thyromegaly present.  Cardiovascular:  SR without murmurs or gallops.  No carotid bruits Breast:  Not examined Respiratory: Effort normal.  No respiratory distress. No chest wall tenderness. Breath sounds normal.  No wheezes, rales or rhonchi.  Abdomen:  nontender  GU:  unremarkable Musculoskeletal: Normal range of motion. Extremities are nontender. No cyanosis, edema or clubbing  noted Lymphadenopathy: No cervical, preauricular, postauricular or axillary adenopathy is present Skin: Skin is warm and dry. No rash noted. No diaphoresis. No erythema. No pallor. Pscyh: Normal mood and affect. Behavior is normal. Judgment and thought content normal.   LABORATORY RESULTS: No results found for this or any previous visit (from the past 48 hour(s)).   RADIOLOGY RESULTS: No results found.  Problem List: Patient Active Problem List   Diagnosis Date Noted  . Constipation 02/24/2017  . GERD (gastroesophageal reflux disease) 11/18/2016  . Diabetic retinopathy ([place])  11/18/2016  . Hyperlipidemia associated with type 2 diabetes mellitus ([place]) 11/18/2016  . Daytime sleepiness 07/15/2016  . Fatigue 07/15/2016  . Presbycusis of both ears 07/15/2016  . Tinnitus of right ear 07/15/2016  . Allergic rhinitis due to allergen 06/09/2016  . CKD (chronic kidney disease), stage III ([place]) 02/27/2016  . BPPV (benign paroxysmal positional vertigo), left 02/27/2016  . Chronic pansinusitis 02/27/2016  . Arthritis 07/03/2015  . Colon polyp 07/03/2015  . Essential hypertension 07/03/2015  . High cholesterol 07/03/2015  . Type 2 diabetes, controlled, with neuropathy ([place]) 07/03/2015    Assessment & Plan: Gallstones and chronic cholecystitis--plan lap cholecystectomy    Matt B. Hassell Done, MD, [facility] [phone] beeper [phone]  01/31/2018 5:22 PM

## 2018-02-04 ENCOUNTER — Ambulatory Visit: Payer: Medicare Other

## 2018-02-04 ENCOUNTER — Ambulatory Visit: Payer: Medicare Other | Admitting: Anesthesiology

## 2018-02-04 ENCOUNTER — Ambulatory Visit
Admission: RE | Admit: 2018-02-04 | Discharge: 2018-02-04 | Disposition: A | Discharge status: Home / Self Care | Payer: Medicare Other | Attending: Surgery | Admitting: Surgery

## 2018-02-04 ENCOUNTER — Encounter: Payer: Self-pay | Admitting: *Deleted

## 2018-02-04 ENCOUNTER — Encounter
Admission: RE | Disposition: A | Discharge status: Home / Self Care | Payer: Self-pay | Source: Home / Self Care | Attending: Surgery

## 2018-02-04 ENCOUNTER — Other Ambulatory Visit: Payer: Self-pay

## 2018-02-04 DIAGNOSIS — K811 Chronic cholecystitis: Secondary | ICD-10-CM | POA: Diagnosis not present

## 2018-02-04 DIAGNOSIS — E1140 Type 2 diabetes mellitus with diabetic neuropathy, unspecified: Secondary | ICD-10-CM | POA: Insufficient documentation

## 2018-02-04 DIAGNOSIS — Z85828 Personal history of other malignant neoplasm of skin: Secondary | ICD-10-CM | POA: Diagnosis not present

## 2018-02-04 DIAGNOSIS — E11319 Type 2 diabetes mellitus with unspecified diabetic retinopathy without macular edema: Secondary | ICD-10-CM | POA: Diagnosis not present

## 2018-02-04 DIAGNOSIS — Z87891 Personal history of nicotine dependence: Secondary | ICD-10-CM | POA: Diagnosis not present

## 2018-02-04 DIAGNOSIS — K8010 Calculus of gallbladder with chronic cholecystitis without obstruction: Secondary | ICD-10-CM | POA: Diagnosis present

## 2018-02-04 DIAGNOSIS — E1122 Type 2 diabetes mellitus with diabetic chronic kidney disease: Secondary | ICD-10-CM | POA: Insufficient documentation

## 2018-02-04 DIAGNOSIS — N183 Chronic kidney disease, stage 3 (moderate): Secondary | ICD-10-CM | POA: Diagnosis not present

## 2018-02-04 DIAGNOSIS — Z794 Long term (current) use of insulin: Secondary | ICD-10-CM | POA: Insufficient documentation

## 2018-02-04 DIAGNOSIS — I129 Hypertensive chronic kidney disease with stage 1 through stage 4 chronic kidney disease, or unspecified chronic kidney disease: Secondary | ICD-10-CM | POA: Diagnosis not present

## 2018-02-04 DIAGNOSIS — Z79899 Other long term (current) drug therapy: Secondary | ICD-10-CM | POA: Insufficient documentation

## 2018-02-04 DIAGNOSIS — K8020 Calculus of gallbladder without cholecystitis without obstruction: Secondary | ICD-10-CM

## 2018-02-04 DIAGNOSIS — E785 Hyperlipidemia, unspecified: Secondary | ICD-10-CM | POA: Insufficient documentation

## 2018-02-04 DIAGNOSIS — K219 Gastro-esophageal reflux disease without esophagitis: Secondary | ICD-10-CM | POA: Diagnosis not present

## 2018-02-04 HISTORY — DX: Anxiety disorder, unspecified: F41.9

## 2018-02-04 HISTORY — PX: CHOLECYSTECTOMY: SHX55

## 2018-02-04 LAB — GLUCOSE, CAPILLARY
Glucose-Capillary: 209 mg/dL — ABNORMAL HIGH (ref 70–99)
Glucose-Capillary: 198 mg/dL — ABNORMAL HIGH (ref 70–99)

## 2018-02-04 SURGERY — LAPAROSCOPIC CHOLECYSTECTOMY
Anesthesia: General | Site: Abdomen

## 2018-02-04 MED ORDER — CEFAZOLIN SODIUM-DEXTROSE 2-4 GM/100ML-% IV SOLN
INTRAVENOUS | Status: AC
  Filled 2018-02-04: qty 100

## 2018-02-04 MED ORDER — BUPIVACAINE LIPOSOME 1.3 % IJ SUSP: 20.0000 mL | Freq: Once | INTRAMUSCULAR | Status: DC

## 2018-02-04 MED ORDER — ACETAMINOPHEN 500 MG PO TABS
1000.0000 mg | ORAL_TABLET | ORAL | Status: AC
  Administered 2018-02-04: 1000 mg via ORAL

## 2018-02-04 MED ORDER — SODIUM CHLORIDE 0.9 % IV SOLN
INTRAVENOUS | Status: DC
  Administered 2018-02-04: 50 mL/h via INTRAVENOUS

## 2018-02-04 MED ORDER — HEPARIN SODIUM (PORCINE) 5000 UNIT/ML IJ SOLN
5000.0000 [IU] | Freq: Once | INTRAMUSCULAR | Status: AC
  Administered 2018-02-04: 5000 [IU] via SUBCUTANEOUS

## 2018-02-04 MED ORDER — SUGAMMADEX SODIUM 200 MG/2ML IV SOLN
INTRAVENOUS | Status: DC | PRN
  Administered 2018-02-04: 200 mg via INTRAVENOUS

## 2018-02-04 MED ORDER — ACETAMINOPHEN 10 MG/ML IV SOLN
INTRAVENOUS | Status: AC
  Filled 2018-02-04: qty 100

## 2018-02-04 MED ORDER — ROCURONIUM BROMIDE 100 MG/10ML IV SOLN
INTRAVENOUS | Status: DC | PRN
  Administered 2018-02-04: 40 mg via INTRAVENOUS

## 2018-02-04 MED ORDER — SUGAMMADEX SODIUM 200 MG/2ML IV SOLN
INTRAVENOUS | Status: AC
  Filled 2018-02-04: qty 2

## 2018-02-04 MED ORDER — LIDOCAINE HCL (CARDIAC) PF 100 MG/5ML IV SOSY
PREFILLED_SYRINGE | INTRAVENOUS | Status: DC | PRN
  Administered 2018-02-04: 60 mg via INTRAVENOUS

## 2018-02-04 MED ORDER — ONDANSETRON HCL 4 MG/2ML IJ SOLN: 4.0000 mg | Freq: Once | INTRAMUSCULAR | Status: DC | PRN

## 2018-02-04 MED ORDER — FENTANYL CITRATE (PF) 100 MCG/2ML IJ SOLN
INTRAMUSCULAR | Status: AC
  Filled 2018-02-04: qty 2

## 2018-02-04 MED ORDER — PHENYLEPHRINE HCL 10 MG/ML IJ SOLN
INTRAMUSCULAR | Status: DC | PRN
  Administered 2018-02-04 (×3): 100 ug via INTRAVENOUS

## 2018-02-04 MED ORDER — ONDANSETRON HCL 4 MG/2ML IJ SOLN
INTRAMUSCULAR | Status: AC
  Filled 2018-02-04: qty 2

## 2018-02-04 MED ORDER — GABAPENTIN 300 MG PO CAPS
300.0000 mg | ORAL_CAPSULE | ORAL | Status: AC
  Administered 2018-02-04: 300 mg via ORAL

## 2018-02-04 MED ORDER — FENTANYL CITRATE (PF) 100 MCG/2ML IJ SOLN
INTRAMUSCULAR | Status: DC | PRN
  Administered 2018-02-04 (×3): 50 ug via INTRAVENOUS

## 2018-02-04 MED ORDER — SODIUM CHLORIDE (PF) 0.9 % IJ SOLN
INTRAMUSCULAR | Status: AC
  Filled 2018-02-04: qty 50

## 2018-02-04 MED ORDER — BUPIVACAINE LIPOSOME 1.3 % IJ SUSP: INTRAMUSCULAR | Status: DC | PRN

## 2018-02-04 MED ORDER — GABAPENTIN 300 MG PO CAPS
ORAL_CAPSULE | ORAL | Status: AC
  Administered 2018-02-04: 300 mg via ORAL
  Filled 2018-02-04: qty 1

## 2018-02-04 MED ORDER — CEFAZOLIN SODIUM-DEXTROSE 2-4 GM/100ML-% IV SOLN
2.0000 g | INTRAVENOUS | Status: AC
  Administered 2018-02-04: 2 g via INTRAVENOUS

## 2018-02-04 MED ORDER — FENTANYL CITRATE (PF) 100 MCG/2ML IJ SOLN: 25.0000 ug | INTRAMUSCULAR | Status: DC | PRN

## 2018-02-04 MED ORDER — ACETAMINOPHEN 500 MG PO TABS
ORAL_TABLET | ORAL | Status: AC
  Administered 2018-02-04: 1000 mg via ORAL
  Filled 2018-02-04: qty 2

## 2018-02-04 MED ORDER — HEPARIN SODIUM (PORCINE) 5000 UNIT/ML IJ SOLN
INTRAMUSCULAR | Status: AC
  Administered 2018-02-04: 5000 [IU] via SUBCUTANEOUS
  Filled 2018-02-04: qty 1

## 2018-02-04 MED ORDER — PROPOFOL 10 MG/ML IV BOLUS
INTRAVENOUS | Status: AC
  Filled 2018-02-04: qty 20

## 2018-02-04 MED ORDER — PROPOFOL 10 MG/ML IV BOLUS
INTRAVENOUS | Status: DC | PRN
  Administered 2018-02-04: 100 mg via INTRAVENOUS
  Administered 2018-02-04: 20 mg via INTRAVENOUS

## 2018-02-04 MED ORDER — SODIUM CHLORIDE 0.9 % IV SOLN
INTRAVENOUS | Status: DC | PRN
  Administered 2018-02-04: 100 mL

## 2018-02-04 MED ORDER — HYDROCODONE-ACETAMINOPHEN 5-325 MG PO TABS: 1.0000 | ORAL_TABLET | Freq: Four times a day (QID) | ORAL | 0 refills | Status: DC | PRN

## 2018-02-04 MED ORDER — SODIUM CHLORIDE 0.9 % IV SOLN
INTRAVENOUS | Status: DC | PRN
  Administered 2018-02-04: 30 mL

## 2018-02-04 MED ORDER — LIDOCAINE HCL (PF) 2 % IJ SOLN
INTRAMUSCULAR | Status: AC
  Filled 2018-02-04: qty 10

## 2018-02-04 MED ORDER — CHLORHEXIDINE GLUCONATE CLOTH 2 % EX PADS: 6.0000 | MEDICATED_PAD | Freq: Once | CUTANEOUS | Status: DC

## 2018-02-04 MED ORDER — BUPIVACAINE LIPOSOME 1.3 % IJ SUSP
INTRAMUSCULAR | Status: AC
  Filled 2018-02-04: qty 20

## 2018-02-04 MED ORDER — ONDANSETRON HCL 4 MG/2ML IJ SOLN
INTRAMUSCULAR | Status: DC | PRN
  Administered 2018-02-04: 4 mg via INTRAVENOUS

## 2018-02-04 MED ORDER — ROCURONIUM BROMIDE 50 MG/5ML IV SOLN
INTRAVENOUS | Status: AC
  Filled 2018-02-04: qty 1

## 2018-02-04 SURGICAL SUPPLY — 47 items
APPLIER CLIP 5 13 M/L LIGAMAX5 (MISCELLANEOUS) ×3
APPLIER CLIP ROT 10 11.4 M/L (STAPLE) ×3
CANISTER SUCT 1200ML W/VALVE (MISCELLANEOUS) ×3 IMPLANT
CATH REDDICK CHOLANGI 4FR 50CM (CATHETERS) ×3 IMPLANT
CHLORAPREP W/TINT 26ML (MISCELLANEOUS) ×3 IMPLANT
CLIP APPLIE 5 13 M/L LIGAMAX5 (MISCELLANEOUS) ×2 IMPLANT
CLIP APPLIE ROT 10 11.4 M/L (STAPLE) ×2 IMPLANT
DERMABOND ADVANCED (GAUZE/BANDAGES/DRESSINGS) ×1
DERMABOND ADVANCED .7 DNX12 (GAUZE/BANDAGES/DRESSINGS) ×2 IMPLANT
DRAPE C-ARM XRAY 36X54 (DRAPES) ×3 IMPLANT
ELECT REM PT RETURN 9FT ADLT (ELECTROSURGICAL) ×3
ELECTRODE REM PT RTRN 9FT ADLT (ELECTROSURGICAL) ×2 IMPLANT
GLOVE BIO SURGEON STRL SZ8 (GLOVE) ×3 IMPLANT
GOWN STRL REUS W/ TWL LRG LVL3 (GOWN DISPOSABLE) ×4 IMPLANT
GOWN STRL REUS W/TWL 2XL LVL3 (GOWN DISPOSABLE) ×3 IMPLANT
GOWN STRL REUS W/TWL LRG LVL3 (GOWN DISPOSABLE) ×2
IRRIG SUCT STRYKERFLOW 2 WTIP (MISCELLANEOUS) ×3
IRRIGATION SUCT STRKRFLW 2 WTP (MISCELLANEOUS) ×2 IMPLANT
IV CATH 14GX2 1/4 (CATHETERS) ×3 IMPLANT
IV CATH ANGIO 12GX3 LT BLUE (NEEDLE) ×3 IMPLANT
IV CATH ANGIO 14GX1.88 NO SAFE (IV SOLUTION) ×3 IMPLANT
IV NS 1000ML (IV SOLUTION) ×1
IV NS 1000ML BAXH (IV SOLUTION) ×2 IMPLANT
KIT PINK PAD W/HEAD ARE REST (MISCELLANEOUS) ×3
KIT PINK PAD W/HEAD ARM REST (MISCELLANEOUS) ×2 IMPLANT
KIT TURNOVER KIT A (KITS) ×3 IMPLANT
L-HOOK LAP DISP 36CM (ELECTROSURGICAL) ×3
LHOOK LAP DISP 36CM (ELECTROSURGICAL) ×2 IMPLANT
NEEDLE HYPO 22GX1.5 SAFETY (NEEDLE) ×3 IMPLANT
NS IRRIG 1000ML POUR BTL (IV SOLUTION) ×3 IMPLANT
NS IRRIG 500ML POUR BTL (IV SOLUTION) ×3 IMPLANT
PACK LAP CHOLECYSTECTOMY (MISCELLANEOUS) ×3 IMPLANT
PENCIL ELECTRO HAND CTR (MISCELLANEOUS) ×3 IMPLANT
POUCH SPECIMEN RETRIEVAL 10MM (ENDOMECHANICALS) ×3 IMPLANT
SCISSORS METZENBAUM CVD 33 (INSTRUMENTS) ×3 IMPLANT
SLEEVE ENDOPATH XCEL 5M (ENDOMECHANICALS) ×6 IMPLANT
SUT MNCRL AB 4-0 PS2 18 (SUTURE) ×4 IMPLANT
SUT VIC AB 4-0 SH 27 (SUTURE) ×1
SUT VIC AB 4-0 SH 27XANBCTRL (SUTURE) ×2 IMPLANT
SUT VICRYL 0 AB UR-6 (SUTURE) ×3 IMPLANT
SYR 10ML LL (SYRINGE) ×3 IMPLANT
SYR 20CC LL (SYRINGE) ×1 IMPLANT
TOWEL OR 17X26 4PK STRL BLUE (TOWEL DISPOSABLE) ×3 IMPLANT
TROCAR XCEL BLUNT TIP 100MML (ENDOMECHANICALS) ×3 IMPLANT
TROCAR XCEL NON-BLD 11X100MML (ENDOMECHANICALS) ×3 IMPLANT
TROCAR XCEL NON-BLD 5MMX100MML (ENDOMECHANICALS) ×3 IMPLANT
TUBING INSUFFLATION (TUBING) ×3 IMPLANT

## 2018-02-04 NOTE — Interval H&P Note (Signed)
History and Physical Interval Note:  02/04/2018 1:31 PM  Charles York  has presented today for surgery, with the diagnosis of Chronic cholecystitis  The various methods of treatment have been discussed with the patient and family. After consideration of risks, benefits and other options for treatment, the patient has consented to  Procedure(s): LAPAROSCOPIC CHOLECYSTECTOMY WITH INTRAOPERATIVE CHOLANGIOGRAM (N/A) as a surgical intervention .  The patient's history has been reviewed, patient examined, no change in status, stable for surgery.  I have reviewed the patient's chart and labs.  Questions were answered to the patient's satisfaction.     Pedro Earls

## 2018-02-04 NOTE — Transfer of Care (Signed)
Immediate Anesthesia Transfer of Care Note  Patient: Charles York  Procedure(s) Performed: LAPAROSCOPIC CHOLECYSTECTOMY (N/A Abdomen)  Patient Location: PACU  Anesthesia Type:General  Level of Consciousness: awake and patient cooperative  Airway & Oxygen Therapy: Patient Spontanous Breathing and Patient connected to face mask oxygen  Post-op Assessment: Report given to RN and Post -op Vital signs reviewed and stable  Post vital signs: stable  Last Vitals:  Vitals Value Taken Time  BP 148/58 02/04/2018  3:24 PM  Temp 36.3 C 02/04/2018  3:24 PM  Pulse 70 02/04/2018  3:30 PM  Resp 21 02/04/2018  3:30 PM  SpO2 100 % 02/04/2018  3:30 PM  Vitals shown include unvalidated device data.  Last Pain:  Vitals:   02/04/18 1524  TempSrc:   PainSc: (P) 0-No pain         Complications: No apparent anesthesia complications

## 2018-02-04 NOTE — Anesthesia Preprocedure Evaluation (Signed)
Anesthesia Evaluation  Patient identified by MRN, date of birth, ID band Patient awake    Reviewed: Allergy & Precautions, H&P , NPO status , Patient's Chart, lab work & pertinent test results, reviewed documented beta blocker date and time   Airway Mallampati: II  TM Distance: >3 FB Neck ROM: full    Dental  (+) Teeth Intact   Pulmonary neg pulmonary ROS, former smoker,    Pulmonary exam normal        Cardiovascular Exercise Tolerance: Good hypertension, On Medications negative cardio ROS Normal cardiovascular exam Rhythm:regular Rate:Normal     Neuro/Psych negative neurological ROS  negative psych ROS   GI/Hepatic negative GI ROS, Neg liver ROS, GERD  Medicated,  Endo/Other  negative endocrine ROSdiabetes, Well Controlled, Type 1, Insulin Dependent  Renal/GU Renal diseasenegative Renal ROS  negative genitourinary   Musculoskeletal   Abdominal   Peds  Hematology negative hematology ROS (+)   Anesthesia Other Findings Past Medical History: No date: Anxiety No date: Arthritis No date: Cancer ([place])     Comment:  skin No date: Chronic kidney disease     Comment:  stage 3 No date: Colon polyp No date: Diabetes mellitus without complication ([facility]) No date: GERD (gastroesophageal reflux disease) No date: Heartburn No date: Hyperlipemia No date: Hypertension No date: Obesity Past Surgical History: 11/2017: EYE SURGERY     Comment:  fluid in back of eye/ injection every 3 months. done at               [facility] No date: EYE SURGERY; Bilateral     Comment:  cataracts extracted No date: No surgical hx BMI    Body Mass Index:  27.43 kg/m     Reproductive/Obstetrics negative OB ROS                             Anesthesia Physical Anesthesia Plan  ASA: III  Anesthesia Plan: General ETT   Post-op Pain Management:    Induction:   PONV Risk Score and Plan: 3  Airway Management Planned:    Additional Equipment:   Intra-op Plan:   Post-operative Plan:   Informed Consent: I have reviewed the patients History and Physical, chart, labs and discussed the procedure including the risks, benefits and alternatives for the proposed anesthesia with the patient or authorized representative who has indicated his/her understanding and acceptance.   Dental Advisory Given  Plan Discussed with: CRNA  Anesthesia Plan Comments:         Anesthesia Quick Evaluation

## 2018-02-04 NOTE — Discharge Instructions (Addendum)
Laparoscopic Cholecystectomy °Laparoscopic cholecystectomy is surgery to remove the gallbladder. The gallbladder is a pear-shaped organ that lies beneath the liver on the right side of the body. The gallbladder stores bile, which is a fluid that helps the body to digest fats. Cholecystectomy is often done for inflammation of the gallbladder (cholecystitis). This condition is usually caused by a buildup of gallstones (cholelithiasis) in the gallbladder. Gallstones can block the flow of bile, which can result in inflammation and pain. In severe cases, emergency surgery may be required. °This procedure is done though small incisions in your abdomen (laparoscopic surgery). A thin scope with a camera (laparoscope) is inserted through one incision. Thin surgical instruments are inserted through the other incisions. In some cases, a laparoscopic procedure may be turned into a type of surgery that is done through a larger incision (open surgery). °Tell a health care provider about: °· Any allergies you have. °· All medicines you are taking, including vitamins, herbs, eye drops, creams, and over-the-counter medicines. °· Any problems you or family members have had with anesthetic medicines. °· Any blood disorders you have. °· Any surgeries you have had. °· Any medical conditions you have. °· Whether you are pregnant or may be pregnant. °What are the risks? °Generally, this is a safe procedure. However, problems may occur, including: °· Infection. °· Bleeding. °· Allergic reactions to medicines. °· Damage to other structures or organs. °· A stone remaining in the common bile duct. The common bile duct carries bile from the gallbladder into the small intestine. °· A bile leak from the cyst duct that is clipped when your gallbladder is removed. °What happens before the procedure? °Staying hydrated °Follow instructions from your health care provider about hydration, which may include: °· Up to 2 hours before the procedure - you  may continue to drink clear liquids, such as water, clear fruit juice, black coffee, and plain tea. °Eating and drinking restrictions °Follow instructions from your health care provider about eating and drinking, which may include: °· 8 hours before the procedure - stop eating heavy meals or foods such as meat, fried foods, or fatty foods. °· 6 hours before the procedure - stop eating light meals or foods, such as toast or cereal. °· 6 hours before the procedure - stop drinking milk or drinks that contain milk. °· 2 hours before the procedure - stop drinking clear liquids. °Medicines °· Ask your health care provider about: °? Changing or stopping your regular medicines. This is especially important if you are taking diabetes medicines or blood thinners. °? Taking medicines such as aspirin and ibuprofen. These medicines can thin your blood. Do not take these medicines before your procedure if your health care provider instructs you not to. °· You may be given antibiotic medicine to help prevent infection. °General instructions °· Let your health care provider know if you develop a cold or an infection before surgery. °· Plan to have someone take you home from the hospital or clinic. °· Ask your health care provider how your surgical site will be marked or identified. °What happens during the procedure? ° °· To reduce your risk of infection: °? Your health care team will wash or sanitize their hands. °? Your skin will be washed with soap. °? Hair may be removed from the surgical area. °· An IV tube may be inserted into one of your veins. °· You will be given one or more of the following: °? A medicine to help you relax (sedative). °? A   medicine to make you fall asleep (general anesthetic).  A breathing tube will be placed in your mouth.  Your surgeon will make several small cuts (incisions) in your abdomen.  The laparoscope will be inserted through one of the small incisions. The camera on the laparoscope will  send images to a TV screen (monitor) in the operating room. This lets your surgeon see inside your abdomen.  Air-like gas will be pumped into your abdomen. This will expand your abdomen to give the surgeon more room to perform the surgery.  Other tools that are needed for the procedure will be inserted through the other incisions. The gallbladder will be removed through one of the incisions.  Your common bile duct may be examined. If stones are found in the common bile duct, they may be removed.  After your gallbladder has been removed, the incisions will be closed with stitches (sutures), staples, or skin glue.  Your incisions may be covered with a bandage (dressing). The procedure may vary among health care providers and hospitals. What happens after the procedure?  Your blood pressure, heart rate, breathing rate, and blood oxygen level will be monitored until the medicines you were given have worn off.  You will be given medicines as needed to control your pain.  Do not drive for 24 hours if you were given a sedative. This information is not intended to replace advice given to you by your health care provider. Make sure you discuss any questions you have with your health care provider. Document Released: 01/20/2005 Document Revised: 12/18/2016 Document Reviewed: 07/09/2015 Elsevier Interactive Patient Education  2019 [employer]   1) The drugs that you were given will stay in your system until tomorrow so for the next 24 hours you should not:  A) Drive an automobile B) Make any legal decisions C) Drink any alcoholic beverage   2) You may resume regular meals tomorrow.  Today it is better to start with liquids and gradually work up to solid foods.  You may eat anything you prefer, but it is better to start with liquids, then soup and crackers, and gradually work up to solid foods.   3) Please notify your doctor immediately if  you have any unusual bleeding, trouble breathing, redness and pain at the surgery site, drainage, fever, or pain not relieved by medication.    4) Additional Instructions:        Please contact your physician with any problems or Same Day Surgery at [phone], Monday through Friday 6 am to 4 pm, or [place] at [facility] number at [phone].

## 2018-02-04 NOTE — Anesthesia Procedure Notes (Signed)
Procedure Name: Intubation Date/Time: 02/04/2018 1:59 PM Performed by: Lavone Orn, CRNA Pre-anesthesia Checklist: Patient identified, Emergency Drugs available, Suction available, Patient being monitored and Timeout performed Patient Re-evaluated:Patient Re-evaluated prior to induction Oxygen Delivery Method: Circle system utilized Preoxygenation: Pre-oxygenation with 100% oxygen Induction Type: IV induction Ventilation: Oral airway inserted - appropriate to patient size and Mask ventilation without difficulty Laryngoscope Size: Mac and 4 Tube type: Oral Number of attempts: 1 Airway Equipment and Method: Stylet Placement Confirmation: ETT inserted through vocal cords under direct vision,  positive ETCO2 and breath sounds checked- equal and bilateral Secured at: 22 cm Tube secured with: Tape Dental Injury: Teeth and Oropharynx as per pre-operative assessment

## 2018-02-04 NOTE — Anesthesia Post-op Follow-up Note (Signed)
Anesthesia QCDR form completed.        

## 2018-02-04 NOTE — Op Note (Signed)
Charles York  Primary Care Physician:  [name], DO    02/04/2018  3:34 PM  Procedure: Laparoscopic Cholecystectomy with attempted  intraoperative cholangiogram  Surgeon: Catalina Antigua B. Hassell Done, MD, FACS Asst:  Leighton Ruff, MD, FACS  Anes:  General  Drains:  None  Findings: Small fatty gallbladder; tiny cystic duct and unable to cannulate for cholangiogram  Description of Procedure: The patient was taken to OR 7 at [facility] and given general anesthesia.  The patient was prepped with chlorohexidine and draped sterilely. A time out was performed.  Access to the abdomen was achieved with a 5 mm Optiview through the right upper quadrant3.  Port placement included three 5 mm trocars and one 11 in the upper midline.  Most striking was a wall of dense inflammation above the liver that sealed the right lobe to the peritoneum and prevented our exploration.  It appeared that intervention might result in hepatic injury and that this would warrant postop investigation.  .    The gallbladder was visualized and the fundus was grasped and the gallbladder was elevated. Traction on the infundibulum allowed for successful demonstration of the critical view. Inflammatory changes were chronci.  The cystic duct was identified and clipped up on the gallbladder and an incision was made in the cystic duct and the Reddick catheter was attempted to inserte without success.  Faint bile was seen from the stump. After multiple attempts the cholangiogram was aborted.  The cystic duct was then triple clipped and divided, the cystic artery was double clipped and divided and then the gallbladder was removed from the gallbladder bed. Removal of the gallbladder from the gallbladder bed was performed with no stone spillage.  The gallbladder was then placed in a bag and brought out through one of the trocar sites. The gallbladder bed was inspected and no bleeding or bile leaks were seen.      Incisions were injected with  Exparel diluted to 30 as a TAP block and closed with 4-0 monocryl and Dermabond on the skin.  Sponge and needle count were correct.    The patient was taken to the recovery room in satisfactory condition.

## 2018-02-05 ENCOUNTER — Encounter: Payer: Self-pay | Admitting: Surgery

## 2018-02-05 NOTE — Anesthesia Postprocedure Evaluation (Signed)
Anesthesia Post Note  Patient: Charles York  Procedure(s) Performed: LAPAROSCOPIC CHOLECYSTECTOMY (N/A Abdomen)  Patient location during evaluation: PACU Anesthesia Type: General Level of consciousness: awake and alert Pain management: pain level controlled Vital Signs Assessment: post-procedure vital signs reviewed and stable Respiratory status: spontaneous breathing, nonlabored ventilation, respiratory function stable and patient connected to nasal cannula oxygen Cardiovascular status: blood pressure returned to baseline and stable Postop Assessment: no apparent nausea or vomiting Anesthetic complications: no     Last Vitals:  Vitals:   02/04/18 1623 02/04/18 1650  BP: 136/69 131/64  Pulse: 68 69  Resp: 18 18  Temp: (!) 36.2 C   SpO2: 97% 96%    Last Pain:  Vitals:   02/04/18 1650  TempSrc:   PainSc: 0-No pain                 Martha Clan

## 2018-02-08 LAB — SURGICAL PATHOLOGY

## 2018-02-23 ENCOUNTER — Other Ambulatory Visit: Payer: Self-pay | Admitting: Surgery

## 2018-02-23 DIAGNOSIS — Z9049 Acquired absence of other specified parts of digestive tract: Secondary | ICD-10-CM

## 2018-03-03 ENCOUNTER — Ambulatory Visit
Admission: RE | Admit: 2018-03-03 | Discharge: 2018-03-03 | Disposition: A | Discharge status: Home / Self Care | Payer: Medicare Other | Source: Ambulatory Visit | Attending: Surgery | Admitting: Surgery

## 2018-03-03 DIAGNOSIS — K760 Fatty (change of) liver, not elsewhere classified: Secondary | ICD-10-CM | POA: Insufficient documentation

## 2018-03-03 DIAGNOSIS — K5730 Diverticulosis of large intestine without perforation or abscess without bleeding: Secondary | ICD-10-CM | POA: Insufficient documentation

## 2018-03-03 DIAGNOSIS — Z9049 Acquired absence of other specified parts of digestive tract: Secondary | ICD-10-CM | POA: Diagnosis not present

## 2018-03-03 DIAGNOSIS — I700 Atherosclerosis of aorta: Secondary | ICD-10-CM | POA: Diagnosis not present

## 2018-03-03 LAB — POCT I-STAT CREATININE: Creatinine, Ser: 1.4 mg/dL — ABNORMAL HIGH (ref 0.61–1.24)

## 2018-03-03 MED ORDER — IOPAMIDOL (ISOVUE-300) INJECTION 61%
75.0000 mL | Freq: Once | INTRAVENOUS | Status: AC | PRN
  Administered 2018-03-03: 75 mL via INTRAVENOUS

## 2018-03-30 ENCOUNTER — Ambulatory Visit (INDEPENDENT_AMBULATORY_CARE_PROVIDER_SITE_OTHER): Payer: Medicare Other

## 2018-03-30 VITALS — BP 142/64 | HR 74 | Temp 98.5°F | Resp 15 | Ht 71.0 in | Wt 189.8 lb

## 2018-03-30 DIAGNOSIS — Z0000 Encounter for general adult medical examination without abnormal findings: Secondary | ICD-10-CM

## 2018-03-30 NOTE — Progress Notes (Signed)
Subjective:   Charles York is a 85 y.o. male who presents for Medicare Annual/Subsequent preventive examination.  Review of Systems:   Cardiac Risk Factors include: advanced age (>86men, >85 women);hypertension;male gender;dyslipidemia;diabetes mellitus     Objective:    Vitals: BP (!) 142/64 (BP Location: Left Arm, Patient Position: Sitting, Cuff Size: Normal)   Pulse 74   Temp 98.5 F (36.9 C) (Oral)   Resp 15   Ht 5\' 11"  (1.803 m)   Wt 189 lb 12.8 oz (86.1 kg)   BMI 26.47 kg/m   Body mass index is 26.47 kg/m.  Advanced Directives 03/30/2018 02/04/2018 01/22/2018 11/04/2016 06/26/2014  Does Patient Have a Medical Advance Directive? No No No No No  Would patient like information on creating a medical advance directive? No - Patient declined No - Patient declined No - Patient declined No - Patient declined Yes - Scientist, clinical (histocompatibility and immunogenetics) given    Tobacco Social History   Tobacco Use  Smoking Status Former Smoker  . Types: Cigarettes  . Last attempt to quit: 02/04/1983  . Years since quitting: 35.1  Smokeless Tobacco Never Used     Counseling given: Not Answered   Clinical Intake:  Pre-visit preparation completed: Yes  Pain : No/denies pain     Nutrition Risk Assessment:  Has the patient had any N/V/D within the last 2 months?  No  Does the patient have any non-healing wounds?  No  Has the patient had any unintentional weight loss or weight gain?  No   Diabetes:  Is the patient diabetic?  Yes  If diabetic, was a CBG obtained today?  No  Did the patient bring in their glucometer from home?  No  How often do you monitor your CBG's? Twice a day .   Financial Strains and Diabetes Management:  Are you having any financial strains with the device, your supplies or your medication? No .  Does the patient want to be seen by Chronic Care Management for management of their diabetes?  No  Would the patient like to be referred to a Nutritionist or for Diabetic Management?   No   Diabetic Exams:  Diabetic Eye Exam: scheduled at the [place] on 04/06/2018, will bring a copy when completed   Diabetic Foot Exam: due now, will schedule an appt with Dr.Karamalegos   How often do you need to have someone help you when you read instructions, pamphlets, or other written materials from your doctor or pharmacy?: 1 - Never What is the last grade level you completed in school?: 12th grade  Interpreter Needed?: No  Information entered by :: Tiffany Hill,LPN   Past Medical History:  Diagnosis Date  . Anxiety   . Arthritis   . Cancer ([place])    skin  . Chronic kidney disease    stage 3  . Colon polyp   . Diabetes mellitus without complication ([place])   . GERD (gastroesophageal reflux disease)   . Heartburn   . Hyperlipemia   . Hypertension   . Obesity    Past Surgical History:  Procedure Laterality Date  . CHOLECYSTECTOMY N/A 02/04/2018   Procedure: LAPAROSCOPIC CHOLECYSTECTOMY;  Surgeon: Johnathan Hausen, MD;  Location: [facility] ORS;  Service: General;  Laterality: N/A;  . EYE SURGERY  11/2017   fluid in back of eye/ injection every 3 months. done at [place]  . EYE SURGERY Bilateral    cataracts extracted   Family History  Problem Relation Age of Onset  . Cancer Father  carcinoma  . Alzheimer's disease Mother    Social History   Socioeconomic History  . Marital status: Widowed    Spouse name: Not on file  . Number of children: Not on file  . Years of education: Not on file  . Highest education level: High school graduate  Occupational History  . 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 Financial resource strain: Not hard at all  . Food insecurity:    Worry: Never true    Inability: Never true  . Transportation needs:    Medical: No    Non-medical: No  Tobacco Use  . Smoking status: Former Smoker    Types: Cigarettes    Last attempt to quit: 02/04/1983    Years since quitting: 35.1  . Smokeless tobacco: Never Used    Substance and Sexual Activity  . Alcohol use: No    Alcohol/week: 0.0 standard drinks  . Drug use: No  . Sexual activity: Not on file  Lifestyle  . Physical activity:    Days per week: 0 days    Minutes per session: 0 min  . Stress: Not at all  Relationships  . Social connections:    Talks on phone: More than three times a week    Gets together: Once a week    Attends religious service: Never    Active member of club or organization: No    Attends meetings of clubs or organizations: Never    Relationship status: Married  Other Topics Concern  . Not on file  Social History Narrative  . Not on file    Outpatient Encounter Medications as of 03/30/2018  Medication Sig  . atorvastatin (LIPITOR) 80 MG tablet Take 40 mg by mouth at bedtime. Pt take 1/2 pill daily at bed time   . hydroxypropyl methylcellulose / hypromellose (ISOPTO TEARS / GONIOVISC) 2.5 % ophthalmic solution Place 1 drop into both eyes 4 (four) times daily.  . insulin aspart (NOVOLOG) 100 UNIT/ML injection Inject 12 Units into the skin 2 (two) times daily.   . insulin glargine (LANTUS) 100 UNIT/ML injection Inject 25 Units into the skin daily after breakfast.   . losartan (COZAAR) 25 MG tablet Take 25 mg by mouth daily.  . ranitidine (ZANTAC) 150 MG tablet Take 150 mg by mouth 2 (two) times daily.   . vitamin B-12 (CYANOCOBALAMIN) 1000 MCG tablet Take 1,000 mcg by mouth daily.  . [DISCONTINUED] FLUAD 0.5 ML SUSY ADM 0.5ML IM UTD  . [DISCONTINUED] HYDROcodone-acetaminophen (NORCO/VICODIN) 5-325 MG tablet Take 1 tablet by mouth every 6 (six) hours as needed for moderate pain. (Patient not taking: Reported on 03/30/2018)  . [DISCONTINUED] polyethylene glycol powder (GLYCOLAX/MIRALAX) powder Take 17 g by mouth daily as needed for mild constipation or moderate constipation. (Patient not taking: Reported on 03/30/2018)   No facility-administered encounter medications on file as of 03/30/2018.     Activities of Daily  Living In your present state of health, do you have any difficulty performing the following activities: 03/30/2018 01/22/2018  Hearing? N N  Comment has hearing aids  -  [facility]? Y N  [facility] -  Difficulty concentrating or making decisions? Y N  Comment comes back to him shortly after  -  Walking or climbing stairs? N Y  Dressing or bathing? N N  Doing errands, shopping? N N  Preparing Food and eating ? N -  Using the Toilet? N -  In the past  six months, have you accidently leaked urine? N -  Do you have problems with loss of bowel control? N -  Managing your Medications? N -  Managing your Finances? N -  Housekeeping or managing your Housekeeping? N -  Some recent data might be hidden    Patient Care Team: [name], DO as PCP - General (Family Medicine)   Assessment:   This is a routine wellness examination for Charles York.  Exercise Activities and Dietary recommendations Current Exercise Habits: Home exercise routine, Time (Minutes): 20, Frequency (Times/Week): 7, Weekly Exercise (Minutes/Week): 140, Intensity: Mild, Exercise limited by: None identified  Goals    . Increase water intake     Recommend eliminating [employer] and replacing with 4-6 glasses of water per day       Fall Risk Fall Risk  03/30/2018 02/24/2017 11/18/2016 11/04/2016 07/15/2016  Falls in the past year? 0 No No No No  Risk for fall due to : - - - Impaired vision -  Risk for fall due to: Comment - - - wears eye glasses -   FALL RISK PREVENTION PERTAINING TO THE HOME:  Any stairs in or around the home? No  If so, are there any without handrails? No   Home free of loose throw rugs in walkways, pet beds, electrical cords, etc? Yes  Adequate lighting in your home to reduce risk of falls? Yes   ASSISTIVE DEVICES UTILIZED TO PREVENT FALLS:  Life alert? No uses phone  Use of a cane, walker or w/c? No  Grab bars in the bathroom? Yes  Shower chair or bench in shower? Yes  Elevated  toilet seat or a handicapped toilet? No   DME ORDERS:  DME order needed?  No   TIMED UP AND GO:  Was the test performed? Yes .  Length of time to ambulate 10 feet: 12 sec.   GAIT:  Appearance of gait: Gait stead-fast without the use of an assistive device.  Education: Fall risk prevention has been discussed.  Intervention(s) required? No    Depression Screen PHQ 2/9 Scores 03/30/2018 10/26/2017 02/24/2017 11/18/2016  PHQ - 2 Score 2 4 0 0  PHQ- 9 Score 2 16 - -  Exception Documentation - - - -    Cognitive Function     6CIT Screen 03/30/2018 11/04/2016  What Year? 0 points 0 points  What month? 0 points 0 points  What time? 0 points 3 points  Count back from 20 0 points 0 points  Months in reverse 0 points 0 points  Repeat phrase 0 points 2 points  Total Score 0 5    Immunization History  Administered Date(s) Administered  . Influenza-Unspecified 10/29/2015, 10/25/2016  . Pneumococcal Conjugate-13 05/07/2016  . Pneumococcal Polysaccharide-23 11/24/2017  . Zoster Recombinat (Shingrix) 05/07/2016, 10/27/2016    Qualifies for Shingles Vaccine? Yes  series completed   Tdap: up to date   Flu Vaccine: up to date   Pneumococcal Vaccine: up to date   Screening Tests Health Maintenance  Topic Date Due  . OPHTHALMOLOGY EXAM  04/03/2016  . FOOT EXAM  06/29/2017  . HEMOGLOBIN A1C  08/24/2017  . INFLUENZA VACCINE  09/03/2017  . TETANUS/TDAP  02/04/2020  . PNA vac Low Risk Adult  Completed   Cancer Screenings:  Colorectal Screening: no longer required    Lung Cancer Screening: (Low Dose CT Chest recommended if Age 61-80 years, 30 pack-year currently smoking OR have quit w/in 15years.) does not qualify.  Additional Screening:  Hepatitis C Screening: does not qualify;  Vision Screening: Recommended annual ophthalmology exams for early detection of glaucoma and other disorders of the eye. Is the patient up to date with their annual eye exam?  Yes  Who is  the provider or what [facility] the [facility]   Dental Screening: Recommended annual dental exams for proper oral hygiene  Community Resource Referral:  CRR required this visit?  No       Plan:    I have personally reviewed and addressed the Medicare Annual Wellness questionnaire and have noted the following in the patient's chart:  A. Medical and social history B. Use of alcohol, tobacco or illicit drugs  C. Current medications and supplements D. Functional ability and status E.  Nutritional status F.  Physical activity G. Advance directives H. List of other physicians I.  Hospitalizations, surgeries, and ER visits in previous 12 months J.  [place] such as hearing and vision if needed, cognitive and depression L. Referrals and appointments   In addition, I have reviewed and discussed with patient certain preventive protocols, quality metrics, and best practice recommendations. A written personalized care plan for preventive services as well as general preventive health recommendations were provided to patient.   Signed,  Tyler Aas, LPN Nurse Health Advisor   Nurse Notes:none

## 2018-03-30 NOTE — Patient Instructions (Signed)
Charles York , Thank you for taking time to come for your Medicare Wellness Visit. I appreciate your ongoing commitment to your health goals. Please review the following plan we discussed and let me know if I can assist you in the future.   Screening recommendations/referrals: Colonoscopy: no longer required Recommended yearly ophthalmology/optometry visit for glaucoma screening and checkup Recommended yearly dental visit for hygiene and checkup  Vaccinations: Influenza vaccine: up to date  Pneumococcal vaccine: up to date Tdap vaccine: up to date Shingles vaccine: up to date     Advanced directives: Advance directive discussed with you today. Even though you declined this today please call our office should you change your mind and we can give you the proper paperwork for you to fill out.  Conditions/risks identified: diabetic, watch soda intake.   Next appointment: Schedule an appt with Dr.Karamalegos for diabetic follow up. Follow up in one year for your medicare annual wellness visit.   Preventive Care 83 Years and Older, Male Preventive care refers to lifestyle choices and visits with your health care provider that can promote health and wellness. What does preventive care include?  A yearly physical exam. This is also called an annual well check.  Dental exams once or twice a year.  Routine eye exams. Ask your health care provider how often you should have your eyes checked.  Personal lifestyle choices, including:  Daily care of your teeth and gums.  Regular physical activity.  Eating a healthy diet.  Avoiding tobacco and drug use.  Limiting alcohol use.  Practicing safe sex.  Taking low doses of aspirin every day.  Taking vitamin and mineral supplements as recommended by your health care provider. What happens during an annual well check? The services and screenings done by your health care provider during your annual well check will depend on your age, overall  health, lifestyle risk factors, and family history of disease. Counseling  Your health care provider may ask you questions about your:  Alcohol use.  Tobacco use.  Drug use.  Emotional well-being.  Home and relationship well-being.  Sexual activity.  Eating habits.  History of falls.  Memory and ability to understand (cognition).  Work and work Statistician. Screening  You may have the following tests or measurements:  Height, weight, and BMI.  Blood pressure.  Lipid and cholesterol levels. These may be checked every 5 years, or more frequently if you are over 29 years old.  Skin check.  Lung cancer screening. You may have this screening every year starting at age 34 if you have a 30-pack-year history of smoking and currently smoke or have quit within the past 15 years.  Fecal occult blood test (FOBT) of the stool. You may have this test every year starting at age 24.  Flexible sigmoidoscopy or colonoscopy. You may have a sigmoidoscopy every 5 years or a colonoscopy every 10 years starting at age 79.  Prostate cancer screening. Recommendations will vary depending on your family history and other risks.  Hepatitis C blood test.  Hepatitis B blood test.  Sexually transmitted disease (STD) testing.  Diabetes screening. This is done by checking your blood sugar (glucose) after you have not eaten for a while (fasting). You may have this done every 1-3 years.  Abdominal aortic aneurysm (AAA) screening. You may need this if you are a current or former smoker.  Osteoporosis. You may be screened starting at age 72 if you are at high risk. Talk with your health care provider about  your test results, treatment options, and if necessary, the need for more tests. Vaccines  Your health care provider may recommend certain vaccines, such as:  Influenza vaccine. This is recommended every year.  Tetanus, diphtheria, and acellular pertussis (Tdap, Td) vaccine. You may need a Td  booster every 10 years.  Zoster vaccine. You may need this after age 24.  Pneumococcal 13-valent conjugate (PCV13) vaccine. One dose is recommended after age 72.  Pneumococcal polysaccharide (PPSV23) vaccine. One dose is recommended after age 14. Talk to your health care provider about which screenings and vaccines you need and how often you need them. This information is not intended to replace advice given to you by your health care provider. Make sure you discuss any questions you have with your health care provider. Document Released: 02/16/2015 Document Revised: 10/10/2015 Document Reviewed: 11/21/2014 Elsevier Interactive Patient Education  2017 [place] Prevention in the Home Falls can cause injuries. They can happen to people of all ages. There are many things you can do to make your home safe and to help prevent falls. What can I do on the outside of my home?  Regularly fix the edges of walkways and driveways and fix any cracks.  Remove anything that might make you trip as you walk through a door, such as a raised step or threshold.  Trim any bushes or trees on the path to your home.  Use bright outdoor lighting.  Clear any walking paths of anything that might make someone trip, such as rocks or tools.  Regularly check to see if handrails are loose or broken. Make sure that both sides of any steps have handrails.  Any raised decks and porches should have guardrails on the edges.  Have any leaves, snow, or ice cleared regularly.  Use sand or salt on walking paths during winter.  Clean up any spills in your garage right away. This includes oil or grease spills. What can I do in the bathroom?  Use night lights.  Install grab bars by the toilet and in the tub and shower. Do not use towel bars as grab bars.  Use non-skid mats or decals in the tub or shower.  If you need to sit down in the shower, use a plastic, non-slip stool.  Keep the floor dry. Clean up  any water that spills on the floor as soon as it happens.  Remove soap buildup in the tub or shower regularly.  Attach bath mats securely with double-sided non-slip rug tape.  Do not have throw rugs and other things on the floor that can make you trip. What can I do in the bedroom?  Use night lights.  Make sure that you have a light by your bed that is easy to reach.  Do not use any sheets or blankets that are too big for your bed. They should not hang down onto the floor.  Have a firm chair that has side arms. You can use this for support while you get dressed.  Do not have throw rugs and other things on the floor that can make you trip. What can I do in the kitchen?  Clean up any spills right away.  Avoid walking on wet floors.  Keep items that you use a lot in easy-to-reach places.  If you need to reach something above you, use a strong step stool that has a grab bar.  Keep electrical cords out of the way.  Do not use floor polish or wax  that makes floors slippery. If you must use wax, use non-skid floor wax.  Do not have throw rugs and other things on the floor that can make you trip. What can I do with my stairs?  Do not leave any items on the stairs.  Make sure that there are handrails on both sides of the stairs and use them. Fix handrails that are broken or loose. Make sure that handrails are as long as the stairways.  Check any carpeting to make sure that it is firmly attached to the stairs. Fix any carpet that is loose or worn.  Avoid having throw rugs at the top or bottom of the stairs. If you do have throw rugs, attach them to the floor with carpet tape.  Make sure that you have a light switch at the top of the stairs and the bottom of the stairs. If you do not have them, ask someone to add them for you. What else can I do to help prevent falls?  Wear shoes that:  Do not have high heels.  Have rubber bottoms.  Are comfortable and fit you well.  Are  closed at the toe. Do not wear sandals.  If you use a stepladder:  Make sure that it is fully opened. Do not climb a closed stepladder.  Make sure that both sides of the stepladder are locked into place.  Ask someone to hold it for you, if possible.  Clearly mark and make sure that you can see:  Any grab bars or handrails.  First and last steps.  Where the edge of each step is.  Use tools that help you move around (mobility aids) if they are needed. These include:  Canes.  Walkers.  Scooters.  Crutches.  Turn on the lights when you go into a dark area. Replace any light bulbs as soon as they burn out.  Set up your furniture so you have a clear path. Avoid moving your furniture around.  If any of your floors are uneven, fix them.  If there are any pets around you, be aware of where they are.  Review your medicines with your doctor. Some medicines can make you feel dizzy. This can increase your chance of falling. Ask your doctor what other things that you can do to help prevent falls. This information is not intended to replace advice given to you by your health care provider. Make sure you discuss any questions you have with your health care provider. Document Released: 11/16/2008 Document Revised: 06/28/2015 Document Reviewed: 02/24/2014 Elsevier Interactive Patient Education  2017 [employer].

## 2018-04-01 ENCOUNTER — Ambulatory Visit (INDEPENDENT_AMBULATORY_CARE_PROVIDER_SITE_OTHER): Payer: Medicare Other | Admitting: Family Medicine

## 2018-04-01 ENCOUNTER — Encounter: Payer: Self-pay | Admitting: Family Medicine

## 2018-04-01 ENCOUNTER — Other Ambulatory Visit: Payer: Self-pay

## 2018-04-01 VITALS — BP 126/53 | HR 74 | Temp 97.4°F | Resp 16 | Ht 67.0 in | Wt 190.0 lb

## 2018-04-01 DIAGNOSIS — E1165 Type 2 diabetes mellitus with hyperglycemia: Secondary | ICD-10-CM

## 2018-04-01 DIAGNOSIS — Z794 Long term (current) use of insulin: Secondary | ICD-10-CM | POA: Diagnosis not present

## 2018-04-01 DIAGNOSIS — E1140 Type 2 diabetes mellitus with diabetic neuropathy, unspecified: Secondary | ICD-10-CM | POA: Diagnosis not present

## 2018-04-01 DIAGNOSIS — E11319 Type 2 diabetes mellitus with unspecified diabetic retinopathy without macular edema: Secondary | ICD-10-CM | POA: Diagnosis not present

## 2018-04-01 DIAGNOSIS — N183 Chronic kidney disease, stage 3 unspecified: Secondary | ICD-10-CM

## 2018-04-01 LAB — POCT GLYCOSYLATED HEMOGLOBIN (HGB A1C): Hemoglobin A1C: 9.1 % — AB (ref 4.0–5.6)

## 2018-04-01 MED ORDER — DULAGLUTIDE 0.75 MG/0.5ML ~~LOC~~ SOAJ: 0.7500 mg | SUBCUTANEOUS | 0 refills | Status: DC

## 2018-04-01 NOTE — Assessment & Plan Note (Signed)
[facility] 

## 2018-04-01 NOTE — Patient Instructions (Addendum)
Thank you for coming to the office today.  Recent Labs    04/01/18 0931  HGBA1C 9.1*   Elevated A1c sugar.  Start Trulicity 0.75mg  ONCE WEEKLY injection - use this with meal on same day each week, pick your preferred day.  Once you start this - you can go ahead and REDUCE your Novolog by HALF, go down to 6 units twice a day with meal, after 2 weeks if you have fasting sugar average < 150 - then you can QUIT or STOP novolog for now.  Samples good for 1 month, 1 pen per dose then discard - once you notify me if it is covered or which is preferred I can order more medication to your pharmacy.  Call insurance find cost and coverage of the following  1. Trulicity (Dulaglutide) - once weekly  2. Ozempic (Semaglutide injection) - once weekly 3. Bydureon BCise (Exenatide ER) - once weekly 4. Victoza (Liraglutide) - once DAILY   Please schedule a Follow-up Appointment to: Return in about 3 months (around 06/30/2018) for DM A1c med.  If you have any other questions or concerns, please feel free to call the office or send a message through [place]. You may also schedule an earlier appointment if necessary.  Additionally, you may be receiving a survey about your experience at our office within a few days to 1 week by e-mail or mail. We value your feedback.  Nobie Putnam, DO [place]

## 2018-04-01 NOTE — Assessment & Plan Note (Signed)
Uncontrolled DM with hyperglycemia A1c up to 9.1 from 7s Complications - CKD-III, peripheral neuropathy, DM retinopathy No further hypoglycemia  Plan:  1. START GLP1 Trulicity 0.75mg  weekly sample x 4 given for 1 month trial - check cost coverage, reviewed side effects benefits - REDUCE Novolog from 12 BID wc - down to 6 units BID WC - may reduce after 1-2 weeks and taper off, caution hypoglycemia - Continue Lantus 30u daily  Encourage improved lifestyle - low carb, low sugar diet, reduce portion size, continue improving regular exercise Check CBG, bring log to next visit for review Continue ASA, ARB, Statin S/p DM eye exam per [facility] in 01/2018 request record  Follow-up 3 months

## 2018-04-01 NOTE — Assessment & Plan Note (Signed)
Secondary to DM, HTN

## 2018-04-01 NOTE — Progress Notes (Signed)
Subjective:    Patient ID: Charles York, male    DOB: 1930/03/20, 85 y.o.   MRN: [id number]  Charles York is a 85 y.o. male presenting on 04/01/2018 for Depression and Diabetes   HPI   FOLLOW-UP ACUTE ADJUSTMENT DISORDER WITH DEPRESSION / ANXIETY Last visit 10/2017, recently after passing of his son. See last note for details He was started on Escitalopram did well, eventually he tapered off of it. He has received bereavement counseling. Now he is adjusting well and has some bad days still but overall better adjusted For ROS see PHQ score below  CHRONIC DM, Type 2, complicated by neuropathy, nephropathy, and retinopathy. He admits some higher sugars recently. CBGs: Avg150 to 180, Low72 (symptoms of hypoglycemia, improved with PO), High< 200. Checks CBGs2x daily AM and PM. No written log available today Meds:Lantus 30 units in AM, Novolog with mealtime 12 units BID Reports good compliance. Tolerating well w/o side-effects Currently on ARB  Lifestyle: - Diet (continues to improve DM diet, no changes since last visit)  - Exercise (walking) - Followed by [facility] - he had DM Eye Exam in December 2018 and then had surgery done by [facility]. He could not establish with [facility] due to cost - CKD: Recent Cr trend elevated, last 1.7 in 07/2016 - Admits hypoglycemia as above, x 1 episode in 3 months, which is improved - Admits some tingling in feet stable without change - Denies worsening burning, skin ulcer, polyuria, hyperglycemia   Depression screen [facility] 2/9 04/01/2018 03/30/2018 10/26/2017  Decreased Interest 2 0 2  Down, Depressed, Hopeless 2 2 2   PHQ - 2 Score 4 2 4   Altered sleeping 0 0 1  Tired, decreased energy 2 0 3  Change in appetite 0 0 3  Feeling bad or failure about yourself  1 0 2  Trouble concentrating 1 0 1  Moving slowly or fidgety/restless 0 0 2  Suicidal thoughts 0 0 0  PHQ-9 Score 8 2 16   Difficult doing work/chores Somewhat difficult Not difficult at all  Not difficult at all   GAD 7 : Generalized Anxiety Score 04/01/2018  Nervous, Anxious, on Edge 1  Control/stop worrying 2  Worry too much - different things 1  Trouble relaxing 1  Restless 1  Easily annoyed or irritable 1  Afraid - awful might happen 0  Total GAD 7 Score 7  Anxiety Difficulty Somewhat difficult    Social History   Tobacco Use  . Smoking status: Former Smoker    Types: Cigarettes    Last attempt to quit: 02/04/1983    Years since quitting: 35.1  . Smokeless tobacco: Never Used  Substance Use Topics  . Alcohol use: No    Alcohol/week: 0.0 standard drinks  . Drug use: No    Review of Systems Per HPI unless specifically indicated above     Objective:    BP (!) 126/53   Pulse 74   Temp (!) 97.4 F (36.3 C) (Oral)   Resp 16   Ht 5\' 7"  (1.702 m)   Wt 190 lb (86.2 kg)   BMI 29.76 kg/m   Wt Readings from Last 3 Encounters:  04/01/18 190 lb (86.2 kg)  03/30/18 189 lb 12.8 oz (86.1 kg)  02/04/18 191 lb 2.2 oz (86.7 kg)    Physical Exam Vitals signs and nursing note reviewed.  Constitutional:      General: He is not in acute distress.    Appearance: He is well-developed.  He is not diaphoretic.     Comments: Well-appearing, comfortable, cooperative  HENT:     Head: Normocephalic and atraumatic.  Eyes:     General:        Right eye: No discharge.        Left eye: No discharge.     Conjunctiva/sclera: Conjunctivae normal.  Cardiovascular:     Rate and Rhythm: Normal rate.  Pulmonary:     Effort: Pulmonary effort is normal.  Skin:    General: Skin is warm and dry.     Findings: No erythema or rash.  Neurological:     Mental Status: He is alert and oriented to person, place, and time.  Psychiatric:        Behavior: Behavior normal.     Comments: Well groomed, good eye contact, normal speech and thoughts. Mood is improved, no longer appears depressed.      Diabetic Foot Exam - Simple   Simple Foot Form Diabetic Foot exam was performed with the  following findings:  Yes 04/01/2018 11:00 AM  Visual Inspection No deformities, no ulcerations, no other skin breakdown bilaterally:  Yes Sensation Testing Intact to touch and monofilament testing bilaterally:  Yes Pulse Check Posterior Tibialis and Dorsalis pulse intact bilaterally:  Yes Comments      Recent Labs    04/01/18 0931  HGBA1C 9.1*     Results for orders placed or performed in visit on 04/01/18  POCT HgB A1C  Result Value Ref Range   Hemoglobin A1C 9.1 (A) 4.0 - 5.6 %      Assessment & Plan:   Problem List Items Addressed This Visit    CKD (chronic kidney disease), stage III ([place])    Secondary to DM, HTN      Diabetic retinopathy ([place])    Followed by [facility] Request last record      Relevant Medications   Dulaglutide (TRULICITY) 9.24 QA/8.3MH SOPN   Type 2 diabetes, controlled, with neuropathy ([place]) - Primary    Uncontrolled DM with hyperglycemia A1c up to 9.1 from 7s Complications - CKD-III, peripheral neuropathy, DM retinopathy No further hypoglycemia  Plan:  1. START GLP1 Trulicity 0.75mg  weekly sample x 4 given for 1 month trial - check cost coverage, reviewed side effects benefits - REDUCE Novolog from 12 BID wc - down to 6 units BID WC - may reduce after 1-2 weeks and taper off, caution hypoglycemia - Continue Lantus 30u daily  Encourage improved lifestyle - low carb, low sugar diet, reduce portion size, continue improving regular exercise Check CBG, bring log to next visit for review Continue ASA, ARB, Statin S/p DM eye exam per [facility] in 01/2018 request record  Follow-up 3 months      Relevant Medications   Dulaglutide (TRULICITY) 9.62 IW/9.7LG SOPN   Other Relevant Orders   POCT HgB A1C (Completed)    Other Visit Diagnoses    Type 2 diabetes mellitus with hyperglycemia, with long-term current use of insulin (HCC)       Relevant Medications   Dulaglutide (TRULICITY) 9.21 JH/4.1DE SOPN      Meds ordered this encounter    Medications  . Dulaglutide (TRULICITY) 0.81 KG/8.1EH SOPN    Sig: Inject 0.75 mg into the skin once a week.    Dispense:  4 pen    Refill:  0   Send rx for trulicity if covered when patient request  Follow up plan: Return in about 3 months (around 06/30/2018) for DM A1c med.  Nobie Putnam, [facility] 04/01/2018, 9:25 AM

## 2018-04-16 ENCOUNTER — Encounter: Payer: Self-pay | Admitting: Family Medicine

## 2018-04-16 ENCOUNTER — Ambulatory Visit (INDEPENDENT_AMBULATORY_CARE_PROVIDER_SITE_OTHER): Payer: Medicare Other | Admitting: Family Medicine

## 2018-04-16 ENCOUNTER — Other Ambulatory Visit: Payer: Self-pay

## 2018-04-16 VITALS — BP 128/61 | HR 75 | Temp 98.1°F | Resp 16 | Ht 67.0 in | Wt 186.6 lb

## 2018-04-16 DIAGNOSIS — M7551 Bursitis of right shoulder: Secondary | ICD-10-CM

## 2018-04-16 DIAGNOSIS — M25511 Pain in right shoulder: Secondary | ICD-10-CM | POA: Diagnosis not present

## 2018-04-16 MED ORDER — METHYLPREDNISOLONE ACETATE 40 MG/ML IJ SUSP
40.0000 mg | Freq: Once | INTRAMUSCULAR | Status: AC
  Administered 2018-04-16: 40 mg via INTRA_ARTICULAR

## 2018-04-16 MED ORDER — LIDOCAINE HCL (PF) 1 % IJ SOLN
4.0000 mL | Freq: Once | INTRAMUSCULAR | Status: AC
  Administered 2018-04-16: 4 mL

## 2018-04-16 NOTE — Progress Notes (Signed)
Subjective:    Patient ID: Charles York, male    DOB: 1931-01-08, 85 y.o.   MRN: [id number]  Charles York is a 85 y.o. 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 Denies any redness, swelling, joint swelling other joint pain or injury, numbness tingling weakness  Depression screen [facility] 2/9 04/16/2018 04/01/2018 03/30/2018  Decreased Interest 0 2 0  Down, Depressed, Hopeless 0 2 2  PHQ - 2 Score 0 4 2  Altered sleeping 0 0 0  Tired, decreased energy 0 2 0  Change in appetite 0 0 0  Feeling bad or failure about yourself  0 1 0  Trouble concentrating 0 1 0  Moving slowly or fidgety/restless 0 0 0  Suicidal thoughts 0 0 0  PHQ-9 Score 0 8 2  Difficult doing work/chores Not difficult at all Somewhat difficult Not difficult at all    Social History   Tobacco Use  . Smoking status: Former Smoker    Types: Cigarettes    Last attempt to quit: 02/04/1983    Years since quitting: 35.2  . Smokeless tobacco: Former Network engineer Use Topics  . Alcohol use: No    Alcohol/week: 0.0 standard drinks  . Drug use: No    Review of Systems Per HPI unless specifically indicated above     Objective:    BP 128/61   Pulse 75   Temp 98.1 F (36.7 C) (Oral)   Resp 16   Ht 5\' 7"  (1.702 m)   Wt 186 lb 9.6 oz (84.6 kg)   BMI 29.23 kg/m   Wt Readings from Last 3 Encounters:  04/16/18 186 lb 9.6 oz (84.6 kg)  04/01/18 190 lb (86.2 kg)  03/30/18 189 lb 12.8 oz (86.1  kg)    Physical Exam Vitals signs and nursing note reviewed.  Constitutional:      General: He is not in acute distress.    Appearance: He is well-developed. He is not diaphoretic.     Comments: Well-appearing, comfortable, cooperative  HENT:     Head: Normocephalic and atraumatic.  Eyes:     General:        Right eye: No discharge.        Left eye: No discharge.     Conjunctiva/sclera: Conjunctivae normal.  Cardiovascular:     Rate and Rhythm: Normal rate.  Pulmonary:     Effort: Pulmonary effort is normal.  Musculoskeletal:     Comments: Right Shoulder Inspection: Normal appearance bilateral symmetrical Palpation: Non-tender to palpation over anterior, lateral, or posterior shoulder  ROM: Mild reduced internal rotation and full forward flexion above shoulder, has decently preserved flexion and abduction ROM, some discomfort pain at top of arc Special Testing: Rotator cuff testing negative for weakness with supraspinatus full can and empty can test but positive for some mild pain / discomfort, Hawkin's AC impingement POSITIVE for pain Strength: Normal strength 5/5 flex/ext, ext rot /  int rot, grip, rotator cuff str testing. Neurovascular: Distally intact pulses, sensation to light touch   Skin:    General: Skin is warm and dry.     Findings: No erythema or rash.  Neurological:     Mental Status: He is alert and oriented to person, place, and time.  Psychiatric:        Behavior: Behavior normal.     Comments: Well groomed, good eye contact, normal speech and thoughts      ________________________________________________________ PROCEDURE NOTE Date: 04/16/18 Right shoulder subacromial injection Discussed benefits and risks (including pain, bleeding, infection, steroid flare). Verbal consent given by patient. Medication:  1 cc Depo-medrol 40mg  and 4 cc Lidocaine 1% without epi Time Out taken  Landmarks identified. Area cleansed with alcohol wipes. Using 21 gauge and 1, 1/2  inch needle, Right subacromial bursa space was injected (with above listed medication) via posterior approach, cold spray used for superficial anesthetic. Sterile bandage placed. Patient tolerated procedure well without bleeding or paresthesias. No complications.   Results for orders placed or performed in visit on 04/01/18  POCT HgB A1C  Result Value Ref Range   Hemoglobin A1C 9.1 (A) 4.0 - 5.6 %      Assessment & Plan:   Problem List Items Addressed This Visit    None    Visit Diagnoses    Acute pain of right shoulder    -  Primary   Relevant Medications   lidocaine (PF) (XYLOCAINE) 1 % injection 4 mL (Completed)   methylPREDNISolone acetate (DEPO-MEDROL) injection 40 mg (Completed)   Acute bursitis of right shoulder       Relevant Medications   lidocaine (PF) (XYLOCAINE) 1 % injection 4 mL (Completed)   methylPREDNISolone acetate (DEPO-MEDROL) injection 40 mg (Completed)      Consistent with acute R-shoulder bursitis vs rotator cuff tendinopathy without significant reduced ROM, and no significant evidence of muscle tear (no weakness). No clear etiology of injury. 88old patient with likely underlying arthritis - No imaging on chart  Plan: Right shoulder subacromial steroid injection performed today, see procedure note for details.  1. Avoid oral NSAID -may use topical diclofenac 2. May take Tylenol Ex Str 1-2 q 6 hr PRN 3. Relative rest but keep shoulder mobile, demonstrated ROM exercises, avoid heavy lifting 4. May try heating pad PRN 5. Follow-up 4-6 weeks if not improved for re-evaluation, consider referral to Physical Therapy, X-rays, Ortho   Meds ordered this encounter  Medications  . lidocaine (PF) (XYLOCAINE) 1 % injection 4 mL  . methylPREDNISolone acetate (DEPO-MEDROL) injection 40 mg     Follow up plan: Return in about 4 weeks (around 05/14/2018) for shoulder pain.  Nobie Putnam, [facility]  04/16/2018, 4:30 PM

## 2018-04-16 NOTE — Patient Instructions (Addendum)
Thank you for coming to the office today.  You received a Right Shoulder Joint steroid injection today. - Lidocaine numbing medicine may ease the pain initially for a few hours until it wears off - As discussed, you may experience a "steroid flare" this evening or within 24-48 hours, anytime medicine is injected into an inflamed joint it can cause the pain to get worse temporarily - Everyone responds differently to these injections, it depends on the patient and the severity of the joint problem, it may provide anywhere from days to weeks, to months of relief. Ideal response is >6 months relief - Try to take it easy for next 1-2 days, avoid over activity and strain on joint (limit lifting for shoulder) - Recommend the following:   - For swelling - rest, compression sleeve / ACE wrap, elevation, and ice packs as needed for first few days   - For pain in future may use heating pad or moist heat as needed  Medication - May consider Baclofen - muscle relaxant if needed  If not improving as expected over next several weeks, please follow-up sooner for re-evaluation. We can consider Orthopedics  Please schedule a Follow-up Appointment to: Return in about 4 weeks (around 05/14/2018) for shoulder pain.  If you have any other questions or concerns, please feel free to call the office or send a message through [place]. You may also schedule an earlier appointment if necessary.  Additionally, you may be receiving a survey about your experience at our office within a few days to 1 week by e-mail or mail. We value your feedback.  Nobie Putnam, DO [facility], [facility]  Range of Motion Shoulder Exercises  Turpin with your good arm against a counter or table for support [facility] forward with a wide stance (make sure your body is comfortable) - Your painful shoulder should hang down and feel "heavy" - Gently move your painful arm in small circles "clockwise" for  several turns - Switch to "counterclockwise" for several turns - Early on keep circles narrow and move slowly - Later in rehab, move in larger circles and faster movement   Wall Crawl - Stand close (about 1-2 ft away) to a wall, facing it directly - Reach out with your arm of painful shoulder and place fingers (not palm) on wall - You should make contact with wall at your waist level - Slowly walk your fingers up the wall. Stay in contact with wall entire time, do not remove fingers - Keep walking fingers up wall until you reach shoulder level - You may feel tightening or mild discomfort, once you reach a height that causes pain or if you are already above your shoulder height then stop. Repeat from starting position. - Early on stand closer to wall, move fingers slowly, and stay at or below shoulder level - Later in rehab, stand farther away from wall (fingertips), move fingers quicker, go above shoulder level

## 2018-04-19 ENCOUNTER — Encounter: Payer: Self-pay | Admitting: Family Medicine

## 2018-05-27 ENCOUNTER — Telehealth: Payer: Self-pay | Admitting: Family Medicine

## 2018-05-27 DIAGNOSIS — K219 Gastro-esophageal reflux disease without esophagitis: Secondary | ICD-10-CM

## 2018-05-27 MED ORDER — OMEPRAZOLE 20 MG PO CPDR: 20.0000 mg | DELAYED_RELEASE_CAPSULE | Freq: Every day | ORAL | 1 refills | Status: DC

## 2018-05-27 NOTE — Telephone Encounter (Signed)
Please notify patient that Ranitidine (Zantac) was recalled by FDA and it is no longer available now.  I have switched him to Omeprazole 20mg  take daily before breakfast. He used to take this medicine in past.  Nobie Putnam, [facility] 05/27/2018, 1:04 PM

## 2018-05-27 NOTE — Telephone Encounter (Signed)
Pt called said that he need a new prescription called in for ranitidine called into [facility]

## 2018-05-28 NOTE — Telephone Encounter (Signed)
Patient advised.

## 2018-05-28 NOTE — Telephone Encounter (Signed)
Left message for patient to call back  

## 2018-06-15 ENCOUNTER — Ambulatory Visit (INDEPENDENT_AMBULATORY_CARE_PROVIDER_SITE_OTHER): Payer: Medicare Other | Admitting: Family Medicine

## 2018-06-15 ENCOUNTER — Encounter: Payer: Self-pay | Admitting: Family Medicine

## 2018-06-15 ENCOUNTER — Other Ambulatory Visit: Payer: Self-pay

## 2018-06-15 VITALS — BP 141/51 | HR 77 | Ht 67.0 in | Wt 192.0 lb

## 2018-06-15 DIAGNOSIS — M25512 Pain in left shoulder: Secondary | ICD-10-CM

## 2018-06-15 DIAGNOSIS — M25511 Pain in right shoulder: Secondary | ICD-10-CM

## 2018-06-15 DIAGNOSIS — M159 Polyosteoarthritis, unspecified: Secondary | ICD-10-CM | POA: Insufficient documentation

## 2018-06-15 DIAGNOSIS — M150 Primary generalized (osteo)arthritis: Secondary | ICD-10-CM | POA: Diagnosis not present

## 2018-06-15 DIAGNOSIS — G8929 Other chronic pain: Secondary | ICD-10-CM | POA: Diagnosis not present

## 2018-06-15 DIAGNOSIS — M8949 Other hypertrophic osteoarthropathy, multiple sites: Secondary | ICD-10-CM | POA: Insufficient documentation

## 2018-06-15 MED ORDER — LIDOCAINE HCL (PF) 1 % IJ SOLN
4.0000 mL | Freq: Once | INTRAMUSCULAR | Status: AC
  Administered 2018-06-15: 4 mL

## 2018-06-15 MED ORDER — METHYLPREDNISOLONE ACETATE 40 MG/ML IJ SUSP
40.0000 mg | Freq: Once | INTRAMUSCULAR | Status: AC
  Administered 2018-06-15: 40 mg via INTRA_ARTICULAR

## 2018-06-15 NOTE — Progress Notes (Signed)
Subjective:    Patient ID: Charles York, male    DOB: Dec 04, 1930, 85 y.o.   MRN: [id number]  Charles York is a 85 y.o. male presenting on 06/15/2018 for Shoulder Pain (chronic bilateral shoulder pain. Pt had injection x 2 mths ago in the Rt shoulder. He notice improvement with the pain after the injection. )   HPI   Chronic Bilateral Shoulder, Pain (RIGHT > Left) / Osteoarthritis multiple joints - Last visit with me 04/16/18, for initial visit for same problem bilateral R>L shoulder pain, prior twisting injury at that time, treated with R shoulder injection, see prior notes for background information. 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 Denies any redness, swelling, joint swelling other joint pain or injury, numbness tingling weakness   Depression screen [facility] 2/9 04/16/2018 04/01/2018 03/30/2018  Decreased Interest 0 2 0  Down, Depressed, Hopeless 0 2 2  PHQ - 2 Score 0 4 2  Altered sleeping 0 0 0  Tired, decreased energy 0 2 0  Change in appetite 0 0 0  Feeling bad or failure about yourself  0 1 0  Trouble concentrating 0 1 0  Moving slowly or fidgety/restless 0 0 0  Suicidal thoughts 0 0 0  PHQ-9 Score 0 8 2  Difficult doing work/chores Not difficult at all Somewhat difficult Not difficult at all    Social History   Tobacco Use  . Smoking status: Former Smoker    Types: Cigarettes    Last attempt to quit: 02/04/1983     Years since quitting: 35.3  . Smokeless tobacco: Former Network engineer Use Topics  . Alcohol use: No    Alcohol/week: 0.0 standard drinks  . Drug use: No    Review of Systems Per HPI unless specifically indicated above     Objective:    BP (!) 141/51 (BP Location: Right Arm, Patient Position: Sitting, Cuff Size: Normal)   Pulse 77   Ht 5\' 7"  (1.702 m)   Wt 192 lb (87.1 kg)   BMI 30.07 kg/m   Wt Readings from Last 3 Encounters:  06/15/18 192 lb (87.1 kg)  04/16/18 186 lb 9.6 oz (84.6 kg)  04/01/18 190 lb (86.2 kg)    Physical Exam Vitals signs and nursing note reviewed.  Constitutional:      General: He is not in acute distress.    Appearance: He is well-developed. He is not diaphoretic.     Comments: Well-appearing, comfortable, cooperative  HENT:     Head: Normocephalic and atraumatic.  Eyes:     General:        Right eye: No discharge.        Left eye: No discharge.     Conjunctiva/sclera: Conjunctivae normal.  Cardiovascular:     Rate and Rhythm: Normal rate.  Pulmonary:     Effort: Pulmonary effort is normal.  Musculoskeletal:     Comments:  Bilateral Shoulder Inspection: Normal appearance bilateral symmetrical Palpation: Mild tender to palpation over anterior shoulder bilateral. Non tender lateral, or posterior shoulder  ROM: Similar to last time mild RIGHT reduced internal rotation and full forward flexion above shoulder, has decently preserved flexion and abduction ROM, some discomfort pain at top of arc Special Testing: BILATERAL Rotator cuff testing negative for weakness with supraspinatus full can and empty can test but positive for some mild pain / discomfort - RIGHT > Left Hawkin's AC impingement POSITIVE for pain Strength: Normal strength 5/5 flex/ext, ext rot / int rot, grip, rotator cuff str testing. Neurovascular: Distally intact pulses, sensation to light touch   Skin:    General: Skin is warm and dry.     Findings: No erythema or rash.   Neurological:     Mental Status: He is alert and oriented to person, place, and time.  Psychiatric:        Behavior: Behavior normal.     Comments: Well groomed, good eye contact, normal speech and thoughts    ________________________________________________________ PROCEDURE NOTE Date: 06/15/18 Left Shoulder subacromial injection Discussed benefits and risks (including pain, bleeding, infection, steroid flare). Verbal consent given by patient. Medication:  1 cc Depo-medrol 40mg  and 4 cc Lidocaine 1% without epi Time Out taken  Landmarks identified. Area cleansed with alcohol wipes. Using 21 gauge and 1, 1/2 inch needle, Left knee subacromial bursa space was injected (with above listed medication) via posterior approach cold spray used for superficial anesthetic. Sterile bandage placed. Patient tolerated procedure well without bleeding or paresthesias. No complications.    Results for orders placed or performed in visit on 04/01/18  POCT HgB A1C  Result Value Ref Range   Hemoglobin A1C 9.1 (A) 4.0 - 5.6 %      Assessment & Plan:   Problem List Items Addressed This Visit    Primary osteoarthritis involving multiple joints   Relevant Medications   methylPREDNISolone acetate (DEPO-MEDROL) injection 40 mg (Completed)    Other Visit Diagnoses    Chronic right shoulder pain    -  Primary   Relevant Medications   lidocaine (PF) (XYLOCAINE) 1 % injection 4 mL (Completed)   methylPREDNISolone acetate (DEPO-MEDROL) injection 40 mg (Completed)   Other Relevant Orders   Ambulatory referral to Orthopedic Surgery   Chronic pain in left shoulder       Relevant Medications   lidocaine (PF) (XYLOCAINE) 1 % injection 4 mL (Completed)   methylPREDNISolone acetate (DEPO-MEDROL) injection 40 mg (Completed)   Other Relevant Orders   Ambulatory referral to Orthopedic Surgery      Interval improvement s/p R subacromial injection mostly resolved then return of pain and symptoms after 2 months.   Clinically with persistent chronic bilateral R>L shoulder bursitis vs rotator cuff tendinopathy without significant reduced ROM, and no significant evidence of muscle tear (no weakness). No clear etiology of injury. 44 old patient with likely underlying arthritis - No imaging on chart - unable to get recent x-rays at our office due to coronavirus pandemic  Plan: LEFT shoulder subacromial steroid injection performed today, see procedure note for details.  Too soon to repeat Right subacromial injection, since only 2 months.  Referral to [facility] for further evaluation / management  1. Use topical voltaren existing rx from [place] should AVOID oral NSAID 2. May take Tylenol Ex Str 1-2 q 6 hr PRN 3. Relative rest but keep shoulder mobile, demonstrated ROM exercises, avoid heavy lifting 4. May try heating pad  PRN 5. Follow-up 8 weeks for DM and Shoulders for further eval after ortho if needed  Meds ordered this encounter  Medications  . lidocaine (PF) (XYLOCAINE) 1 % injection 4 mL  . methylPREDNISolone acetate (DEPO-MEDROL) injection 40 mg   Orders Placed This Encounter  Procedures  . Ambulatory referral to Orthopedic Surgery    Referral Priority:   Routine    Referral Type:   Surgical    Referral Reason:   Specialty Services Required    Requested Specialty:   Orthopedic Surgery    Number of Visits Requested:   1    Follow up plan: Return in about 2 months (around 08/15/2018) for 2 months for DM A1c, Shoulder f/u.  Nobie Putnam, DO [facility] 06/15/2018, 9:11 AM

## 2018-06-15 NOTE — Patient Instructions (Addendum)
Too soon for repeat injection, need to wait 3 total months - next can do in 07/2018 if still need  Referral to Orthopedic  [facility] (formerly [facility] Address: [address], [place], [place] [address] Hours:  9AM-5PM Phone: [phone]  They can do other injections and take pictures on both sides  Keep using Voltaren gel  Keep taking Tylenol  Keep doing range of motion exercises  You received a Left Shoulder Joint steroid injection today. - Lidocaine numbing medicine may ease the pain initially for a few hours until it wears off - As discussed, you may experience a "steroid flare" this evening or within 24-48 hours, anytime medicine is injected into an inflamed joint it can cause the pain to get worse temporarily - Everyone responds differently to these injections, it depends on the patient and the severity of the joint problem, it may provide anywhere from days to weeks, to months of relief. Ideal response is >6 months relief - Try to take it easy for next 1-2 days, avoid over activity and strain on joint (limit lifting for shoulder) - Recommend the following:   - For swelling - rest, compression sleeve / ACE wrap, elevation, and ice packs as needed for first few days   - For pain in future may use heating pad or moist heat as needed   Please schedule a Follow-up Appointment to: Return in about 2 months (around 08/15/2018) for 2 months for DM A1c, Shoulder f/u.  If you have any other questions or concerns, please feel free to call the office or send a message through [place]. You may also schedule an earlier appointment if necessary.  Additionally, you may be receiving a survey about your experience at our office within a few days to 1 week by e-mail or mail. We value your feedback.  Nobie Putnam, DO [place]

## 2018-07-02 ENCOUNTER — Ambulatory Visit: Payer: Medicare Other | Admitting: Family Medicine

## 2018-07-27 ENCOUNTER — Other Ambulatory Visit: Payer: Self-pay | Admitting: Family Medicine

## 2018-07-27 DIAGNOSIS — E1140 Type 2 diabetes mellitus with diabetic neuropathy, unspecified: Secondary | ICD-10-CM

## 2018-07-28 ENCOUNTER — Other Ambulatory Visit: Payer: Medicare Other

## 2018-07-28 ENCOUNTER — Other Ambulatory Visit: Payer: Self-pay

## 2018-07-28 DIAGNOSIS — N183 Chronic kidney disease, stage 3 (moderate): Secondary | ICD-10-CM | POA: Diagnosis not present

## 2018-07-28 DIAGNOSIS — E785 Hyperlipidemia, unspecified: Secondary | ICD-10-CM | POA: Diagnosis not present

## 2018-07-28 DIAGNOSIS — E1169 Type 2 diabetes mellitus with other specified complication: Secondary | ICD-10-CM | POA: Diagnosis not present

## 2018-07-28 DIAGNOSIS — E1140 Type 2 diabetes mellitus with diabetic neuropathy, unspecified: Secondary | ICD-10-CM | POA: Diagnosis not present

## 2018-07-28 DIAGNOSIS — I10 Essential (primary) hypertension: Secondary | ICD-10-CM | POA: Diagnosis not present

## 2018-07-28 DIAGNOSIS — Z0000 Encounter for general adult medical examination without abnormal findings: Secondary | ICD-10-CM | POA: Diagnosis not present

## 2018-07-30 ENCOUNTER — Other Ambulatory Visit: Payer: Self-pay

## 2018-07-30 ENCOUNTER — Encounter: Payer: Self-pay | Admitting: Family Medicine

## 2018-07-30 ENCOUNTER — Ambulatory Visit (INDEPENDENT_AMBULATORY_CARE_PROVIDER_SITE_OTHER): Payer: Medicare Other | Admitting: Family Medicine

## 2018-07-30 DIAGNOSIS — E11319 Type 2 diabetes mellitus with unspecified diabetic retinopathy without macular edema: Secondary | ICD-10-CM | POA: Diagnosis not present

## 2018-07-30 DIAGNOSIS — D508 Other iron deficiency anemias: Secondary | ICD-10-CM

## 2018-07-30 DIAGNOSIS — F4321 Adjustment disorder with depressed mood: Secondary | ICD-10-CM

## 2018-07-30 DIAGNOSIS — E1169 Type 2 diabetes mellitus with other specified complication: Secondary | ICD-10-CM

## 2018-07-30 DIAGNOSIS — E785 Hyperlipidemia, unspecified: Secondary | ICD-10-CM

## 2018-07-30 DIAGNOSIS — E1140 Type 2 diabetes mellitus with diabetic neuropathy, unspecified: Secondary | ICD-10-CM | POA: Diagnosis not present

## 2018-07-30 DIAGNOSIS — N183 Chronic kidney disease, stage 3 unspecified: Secondary | ICD-10-CM

## 2018-07-30 DIAGNOSIS — D649 Anemia, unspecified: Secondary | ICD-10-CM | POA: Insufficient documentation

## 2018-07-30 NOTE — Progress Notes (Signed)
Virtual Visit via Telephone The purpose of this virtual visit is to provide medical care while limiting exposure to the novel coronavirus (COVID19) for both patient and office staff.  Consent was obtained for phone visit:  Yes.   Answered questions that patient had about telehealth interaction:  Yes.   I discussed the limitations, risks, security and privacy concerns of performing an evaluation and management service by telephone. I also discussed with the patient that there may be a patient responsible charge related to this service. The patient expressed understanding and agreed to proceed.  Patient Location: Home Provider Location: [facility])  ---------------------------------------------------------------------- Chief Complaint  Patient presents with  . Diabetes    fbs averaging 160-170    S: Reviewed CMA documentation. I have called patient and gathered additional HPI as follows:  CHRONIC DM, Type 2, complicated by neuropathy, nephropathy, and retinopathy. He admits some higher sugars recently. CBGs: Avg160 to 170, Low72(symptoms of hypoglycemia, improved with PO), High< 200. Checks CBGs2x daily AM and PM. No written log available today Meds:Lantus 30 units in AM, Novolog with mealtime [id number] BID - Off Trulicity due to side effect Reports good compliance. Tolerating well w/o side-effects Currently on ARB  Lifestyle: - Diet (continues to improve DM diet, recently increased sweets and not always following, and improved water intake reduce sodas) - Exercise (walking) -Followed by [facility] - he will DM Eye Exam July 2020 - CKD: Recent Cr trend elevated, last 1.7 in 07/2016 - Admits hypoglycemia as above,x 1 episode in 3 months, which is improved - Admits some tingling in feet stable without change - Denies worsening burning, skin ulcer, polyuria, hyperglycemia  HYPERLIPIDEMIA: - Reports no concerns. Last lipid panel 07/2018, controlled  -  Currently taking Atorva 40, tolerating well without side effects or myalgias  Anemia Last lab shows hgb down to 12.7, prior mild anemia, he has not taken any iron. He admits some fatigue and feeling tired.  Follow up adjustment disorder Overall he remains improved. Previous stressor with loss of his son  Additional update Follow-up Shoulder pain, R side, was given injection in 06/2018 and referred to Orthopedic specialist - now seems improved overall.   Past Medical History:  Diagnosis Date  . Anxiety   . Arthritis   . Cancer ([place])    skin  . Chronic kidney disease    stage 3  . Colon polyp   . GERD (gastroesophageal reflux disease)   . Heartburn   . Hyperlipemia   . Hypertension   . Obesity    Social History   Tobacco Use  . Smoking status: Former Smoker    Types: Cigarettes    Quit date: 02/04/1983    Years since quitting: 35.5  . Smokeless tobacco: Former Network engineer Use Topics  . Alcohol use: No    Alcohol/week: 0.0 standard drinks  . Drug use: No    Current Outpatient Medications:  .  atorvastatin (LIPITOR) 80 MG tablet, Take 40 mg by mouth at bedtime. Pt take 1/2 pill daily at bed time , Disp: , Rfl:  .  hydroxypropyl methylcellulose / hypromellose (ISOPTO TEARS / GONIOVISC) 2.5 % ophthalmic solution, Place 1 drop into both eyes 4 (four) times daily., Disp: , Rfl:  .  insulin aspart (NOVOLOG) 100 UNIT/ML injection, Inject 12 Units into the skin 2 (two) times daily. , Disp: , Rfl:  .  insulin glargine (LANTUS) 100 UNIT/ML injection, Inject 25 Units into the skin daily after breakfast. , Disp: ,  Rfl:  .  losartan (COZAAR) 25 MG tablet, Take 25 mg by mouth daily., Disp: , Rfl:  .  omeprazole (PRILOSEC) 20 MG capsule, Take 1 capsule (20 mg total) by mouth daily before breakfast., Disp: 90 capsule, Rfl: 1 .  vitamin B-12 (CYANOCOBALAMIN) 1000 MCG tablet, Take 1,000 mcg by mouth daily., Disp: , Rfl:   Depression screen [facility] 2/9 04/16/2018 04/01/2018 03/30/2018   Decreased Interest 0 2 0  Down, Depressed, Hopeless 0 2 2  PHQ - 2 Score 0 4 2  Altered sleeping 0 0 0  Tired, decreased energy 0 2 0  Change in appetite 0 0 0  Feeling bad or failure about yourself  0 1 0  Trouble concentrating 0 1 0  Moving slowly or fidgety/restless 0 0 0  Suicidal thoughts 0 0 0  PHQ-9 Score 0 8 2  Difficult doing work/chores Not difficult at all Somewhat difficult Not difficult at all    GAD 7 : Generalized Anxiety Score 04/01/2018  Nervous, Anxious, on Edge 1  Control/stop worrying 2  Worry too much - different things 1  Trouble relaxing 1  Restless 1  Easily annoyed or irritable 1  Afraid - awful might happen 0  Total GAD 7 Score 7  Anxiety Difficulty Somewhat difficult    -------------------------------------------------------------------------- O: No physical exam performed due to remote telephone encounter.  Lab results reviewed.  Recent Results (from the past 2160 hour(s))  Lipid panel     Status: None   Collection Time: 07/28/18  8:06 AM  Result Value Ref Range   Cholesterol 124 <200 mg/dL   HDL 45 > OR = 40 mg/dL   Triglycerides 87 <150 mg/dL   LDL Cholesterol (Calc) 62 mg/dL (calc)    Comment: Reference range: <100 . Desirable range <100 mg/dL for primary prevention;   <70 mg/dL for patients with CHD or diabetic patients  with > or = 2 CHD risk factors. Marland Kitchen LDL-C is now calculated using the Martin-Hopkins  calculation, which is a validated novel method providing  better accuracy than the Friedewald equation in the  estimation of LDL-C.  Cresenciano Genre et al. Annamaria Helling. 5027;741(28): 2061-2068  (http://education.[employer].com/faq/FAQ164)    Total CHOL/HDL Ratio 2.8 <5.0 (calc)   Non-HDL Cholesterol (Calc) 79 <130 mg/dL (calc)    Comment: For patients with diabetes plus 1 major ASCVD risk  factor, treating to a non-HDL-C goal of <100 mg/dL  (LDL-C of <70 mg/dL) is considered a therapeutic  option.   Comprehensive metabolic panel      Status: Abnormal   Collection Time: 07/28/18  8:06 AM  Result Value Ref Range   Glucose, Bld 164 (H) 65 - 99 mg/dL    Comment: .            Fasting reference interval . For someone without known diabetes, a glucose value >125 mg/dL indicates that they may have diabetes and this should be confirmed with a follow-up test. .    BUN 27 (H) 7 - 25 mg/dL   Creat 1.32 (H) 0.70 - 1.11 mg/dL    Comment: For patients >58 years of age, the reference limit for Creatinine is approximately 13% higher for people identified as African-American. .    BUN/Creatinine Ratio 20 6 - 22 (calc)   Sodium 139 135 - 146 mmol/L   Potassium 4.3 3.5 - 5.3 mmol/L   Chloride 107 98 - 110 mmol/L   CO2 22 20 - 32 mmol/L   Calcium 9.2 8.6 - 10.3 mg/dL  Total Protein 6.4 6.1 - 8.1 g/dL   Albumin 4.1 3.6 - 5.1 g/dL   Globulin 2.3 1.9 - 3.7 g/dL (calc)   AG Ratio 1.8 1.0 - 2.5 (calc)   Total Bilirubin 0.5 0.2 - 1.2 mg/dL   Alkaline phosphatase (APISO) 83 35 - 144 U/L   AST 12 10 - 35 U/L   ALT 13 9 - 46 U/L  CBC with Differential/Platelet     Status: Abnormal   Collection Time: 07/28/18  8:06 AM  Result Value Ref Range   WBC 8.1 3.8 - 10.8 Thousand/uL   RBC 3.87 (L) 4.20 - 5.80 Million/uL   Hemoglobin 12.7 (L) 13.2 - 17.1 g/dL   HCT 37.8 (L) 38.5 - 50.0 %   MCV 97.7 80.0 - 100.0 fL   MCH 32.8 27.0 - 33.0 pg   MCHC 33.6 32.0 - 36.0 g/dL   RDW 12.3 11.0 - 15.0 %   Platelets 244 140 - 400 Thousand/uL   MPV 12.2 7.5 - 12.5 fL   Neutro Abs 3,459 1,500 - 7,800 cells/uL   Lymphs Abs 3,823 850 - 3,900 cells/uL   Absolute Monocytes 640 200 - 950 cells/uL   Eosinophils Absolute 130 15 - 500 cells/uL   Basophils Absolute 49 0 - 200 cells/uL   Neutrophils Relative % 42.7 %   Total Lymphocyte 47.2 %   Monocytes Relative 7.9 %   Eosinophils Relative 1.6 %   Basophils Relative 0.6 %    -------------------------------------------------------------------------- A&P:  Problem List Items Addressed This Visit     Absolute anemia    Mild reduced hemoglobin Nearly macrocytic MCV No obvious source of bleeding  Trial oral iron supplement OTC Follow-up consider anemia panel      CKD (chronic kidney disease), stage III (HCC)    Stable on last Cr Secondary to DM, HTN      Diabetic retinopathy (HCC)    Followed by [facility] Ophtho Request last record      Hyperlipidemia associated with type 2 diabetes mellitus ([place])    On Statin Calculated ASCVD 10 yr risk score elevated due to DM, age  Plan: 1. Continue current meds - Atorvastatin 40mg  (HALF tab 80mg )  2. Continue ASA 81mg  for primary ASCVD risk reduction 3. Encourage improved lifestyle - low carb/cholesterol, reduce portion size, continue improving regular exercise      Type 2 diabetes, controlled, with neuropathy ([place]) - Primary    Previously uncontrolled A1c >9, pending result now, added on to [employer], as it was not drawn with other blood Complications - CKD-III, peripheral neuropathy, DM retinopathy No further hypoglycemia OFF Trulicity due to side effect  Plan:  1. Continue Lantus 30u daily, Novolog 6=12 BID wc Encourage improved lifestyle - low carb, low sugar diet, reduce portion size, continue improving regular exercise Check CBG, bring log to next visit for review Continue ASA, ARB, Statin Request record [place] Exam 08/2018 Follow-up 3 months  Consider Rybelsus as option if not interested in repeat GLP1 injection       Other Visit Diagnoses    Adjustment disorder with depressed mood        Improved mood  No orders of the defined types were placed in this encounter.   Follow-up: - Return in 6 months for Diabetes, anemia  Patient verbalizes understanding with the above medical recommendations including the limitation of remote medical advice.  Specific follow-up and call-back criteria were given for patient to follow-up or seek medical care more urgently if needed.   -  Time spent in direct consultation with patient  on phone: 11 minutes   Nobie Putnam, [facility] 07/30/2018, 2:55 PM

## 2018-07-30 NOTE — Patient Instructions (Addendum)
Mild anemia Start iron supplement once daily take with food, ferrous sulfate 325mg  (65 Fe)   Please schedule a Follow-up Appointment to: Return in about 6 months (around 01/29/2019) for 6 months - Diabetes, Anemia (re-check labs CBC).  If you have any other questions or concerns, please feel free to call the office or send a message through [place]. You may also schedule an earlier appointment if necessary.  Additionally, you may be receiving a survey about your experience at our office within a few days to 1 week by e-mail or mail. We value your feedback.  Nobie Putnam, DO [place]

## 2018-07-31 NOTE — Assessment & Plan Note (Signed)
Mild reduced hemoglobin Nearly macrocytic MCV No obvious source of bleeding  Trial oral iron supplement OTC Follow-up consider anemia panel

## 2018-07-31 NOTE — Assessment & Plan Note (Signed)
On Statin Calculated ASCVD 10 yr risk score elevated due to DM, age  Plan: 1. Continue current meds - Atorvastatin 40mg  (HALF tab 80mg )  2. Continue ASA 81mg  for primary ASCVD risk reduction 3. Encourage improved lifestyle - low carb/cholesterol, reduce portion size, continue improving regular exercise

## 2018-07-31 NOTE — Assessment & Plan Note (Signed)
Previously uncontrolled A1c >9, pending result now, added on to [employer], as it was not drawn with other blood Complications - CKD-III, peripheral neuropathy, DM retinopathy No further hypoglycemia OFF Trulicity due to side effect  Plan:  1. Continue Lantus 30u daily, Novolog 6=12 BID wc Encourage improved lifestyle - low carb, low sugar diet, reduce portion size, continue improving regular exercise Check CBG, bring log to next visit for review Continue ASA, ARB, Statin Request record [place] Exam 08/2018 Follow-up 3 months  Consider Rybelsus as option if not interested in repeat GLP1 injection

## 2018-07-31 NOTE — Assessment & Plan Note (Signed)
Stable on last Cr Secondary to DM, HTN

## 2018-07-31 NOTE — Assessment & Plan Note (Signed)
Followed by [facility] Ophtho Request last record

## 2018-08-02 LAB — COMPREHENSIVE METABOLIC PANEL
AG Ratio: 1.8 (calc) (ref 1.0–2.5)
ALT: 13 U/L (ref 9–46)
AST: 12 U/L (ref 10–35)
Albumin: 4.1 g/dL (ref 3.6–5.1)
Alkaline phosphatase (APISO): 83 U/L (ref 35–144)
BUN/Creatinine Ratio: 20 (calc) (ref 6–22)
BUN: 27 mg/dL — ABNORMAL HIGH (ref 7–25)
CO2: 22 mmol/L (ref 20–32)
Calcium: 9.2 mg/dL (ref 8.6–10.3)
Chloride: 107 mmol/L (ref 98–110)
Creat: 1.32 mg/dL — ABNORMAL HIGH (ref 0.70–1.11)
Globulin: 2.3 g/dL (calc) (ref 1.9–3.7)
Glucose, Bld: 164 mg/dL — ABNORMAL HIGH (ref 65–99)
Potassium: 4.3 mmol/L (ref 3.5–5.3)
Sodium: 139 mmol/L (ref 135–146)
Total Bilirubin: 0.5 mg/dL (ref 0.2–1.2)
Total Protein: 6.4 g/dL (ref 6.1–8.1)

## 2018-08-02 LAB — CBC WITH DIFFERENTIAL/PLATELET
Absolute Monocytes: 640 cells/uL (ref 200–950)
Basophils Absolute: 49 cells/uL (ref 0–200)
Basophils Relative: 0.6 %
Eosinophils Absolute: 130 cells/uL (ref 15–500)
Eosinophils Relative: 1.6 %
HCT: 37.8 % — ABNORMAL LOW (ref 38.5–50.0)
Hemoglobin: 12.7 g/dL — ABNORMAL LOW (ref 13.2–17.1)
Lymphs Abs: 3823 cells/uL (ref 850–3900)
MCH: 32.8 pg (ref 27.0–33.0)
MCHC: 33.6 g/dL (ref 32.0–36.0)
MCV: 97.7 fL (ref 80.0–100.0)
MPV: 12.2 fL (ref 7.5–12.5)
Monocytes Relative: 7.9 %
Neutro Abs: 3459 cells/uL (ref 1500–7800)
Neutrophils Relative %: 42.7 %
Platelets: 244 10*3/uL (ref 140–400)
RBC: 3.87 10*6/uL — ABNORMAL LOW (ref 4.20–5.80)
RDW: 12.3 % (ref 11.0–15.0)
Total Lymphocyte: 47.2 %
WBC: 8.1 10*3/uL (ref 3.8–10.8)

## 2018-08-02 LAB — TEST AUTHORIZATION

## 2018-08-02 LAB — LIPID PANEL
Cholesterol: 124 mg/dL (ref <200)
HDL: 45 mg/dL (ref >=40)
LDL Cholesterol (Calc): 62 mg/dL (calc)
Non-HDL Cholesterol (Calc): 79 mg/dL (calc) (ref <130)
Total CHOL/HDL Ratio: 2.8 (calc) (ref <5.0)
Triglycerides: 87 mg/dL (ref <150)

## 2018-08-02 LAB — HEMOGLOBIN A1C W/OUT EAG: Hgb A1c MFr Bld: 7.4 % of total Hgb — ABNORMAL HIGH (ref <5.7)

## 2018-08-04 ENCOUNTER — Other Ambulatory Visit: Payer: Self-pay | Admitting: Family Medicine

## 2018-08-04 DIAGNOSIS — E11319 Type 2 diabetes mellitus with unspecified diabetic retinopathy without macular edema: Secondary | ICD-10-CM

## 2018-08-17 ENCOUNTER — Ambulatory Visit (INDEPENDENT_AMBULATORY_CARE_PROVIDER_SITE_OTHER): Payer: Medicare Other | Admitting: Family Medicine

## 2018-08-17 ENCOUNTER — Other Ambulatory Visit: Payer: Self-pay

## 2018-08-17 ENCOUNTER — Encounter: Payer: Self-pay | Admitting: Family Medicine

## 2018-08-17 VITALS — BP 120/53 | HR 68 | Temp 98.2°F | Resp 16 | Ht 67.0 in | Wt 189.0 lb

## 2018-08-17 DIAGNOSIS — G8929 Other chronic pain: Secondary | ICD-10-CM | POA: Diagnosis not present

## 2018-08-17 DIAGNOSIS — N183 Chronic kidney disease, stage 3 unspecified: Secondary | ICD-10-CM

## 2018-08-17 DIAGNOSIS — D508 Other iron deficiency anemias: Secondary | ICD-10-CM

## 2018-08-17 DIAGNOSIS — E1140 Type 2 diabetes mellitus with diabetic neuropathy, unspecified: Secondary | ICD-10-CM

## 2018-08-17 DIAGNOSIS — M25511 Pain in right shoulder: Secondary | ICD-10-CM

## 2018-08-17 NOTE — Assessment & Plan Note (Signed)
Improved R shoulder > L shoulder joint pain with known chronic bursitis and OA/DJD S/p prior subacromial injection x 2, last 06/2018 Reassurance f/u if worsening

## 2018-08-17 NOTE — Assessment & Plan Note (Signed)
Stable Last creatinine 1.3 avg near baseline Secondary complication to DM and HTN Improved water intake Monitor q 6-12 months

## 2018-08-17 NOTE — Assessment & Plan Note (Signed)
Improved DM control A1c 7.4 now at goal Complications - CKD-III, peripheral neuropathy, DM retinopathy No further hypoglycemia OFF Trulicity due to side effect  Plan:  1. Continue Lantus 30u daily, Novolog 6=12 BID wc Encourage improved lifestyle - low carb, low sugar diet, reduce portion size, continue improving regular exercise Check CBG, bring log to next visit for review Continue ASA, ARB, Statin Awaiting request from eye exam / refer

## 2018-08-17 NOTE — Assessment & Plan Note (Signed)
Improved clinically on oral iron supplement Add vitamin C tab for absorption, may take BID if need F/u

## 2018-08-17 NOTE — Progress Notes (Signed)
Subjective:    Patient ID: Charles York, male    DOB: [birth year], 85 y.o.   MRN: [id number]  EDMUNDO TEDESCO is a 85 y.o. male presenting on 08/17/2018 for Diabetes   HPI   CHRONIC DM, Type 2, complicated by neuropathy, nephropathy, and retinopathy. Last visit 6/26 virtual over phone. Update with A1c 7.4, with improvement CBGs: avg 140-150, Low75(symptoms of hypoglycemia, improved with PO), High< 200. Checks CBGs2x daily AM and PM. No written log today Meds:Lantus 30 units in AM, Novolog with [id number] BID - Off Trulicity due to side effect Reports good compliance. Tolerating well w/o side-effects Currently on ARB  Lifestyle: - Diet (continues to improve DM diet, and improved water intake reduce sodas) - Exercise (walking) -Followed by [facility] - he will DM Eye Exam July 2020 - CKD: Recent Cr trend improved and stable at baseline Cr 1.3 - Admits some tingling in feet stable without change - Denies worsening burning, skin ulcer, polyuria, hyperglycemia  Chronic Bilateral Shoulder, Pain (RIGHT > Left)/ Osteoarthritis multiple joints - Last visit with me 06/15/18 and 04/2018, for same problem, treated with subacromial steroid injection x 2, see prior notes for background information. - Interval update with improved shoulder pain and function - Today patient reports no new concerns, has less pain and doing well. He says shots have helped, he thinks less tension in shoulder now - Less often taking Tylenol 2-3 pills q 8 hr PRN and using Voltaren gel topical from [place] with some benefit Denies any redness, swelling, joint swelling other joint pain or injury, numbness tingling weakness  Anemia Last lab 07/28/18, showed mild low Hgb 12.7, and slightly low HCT, without other abnormality, mostly normal MCV vs mild elevated. He was started on trial of oral iron tablet. Now he says significantly improved feels more energy and doing well, tolerating 1 iron pill daily.  Depression screen  [facility] 2/9 08/17/2018 04/16/2018 04/01/2018  Decreased Interest 0 0 2  Down, Depressed, Hopeless 0 0 2  PHQ - 2 Score 0 0 4  Altered sleeping - 0 0  Tired, decreased energy - 0 2  Change in appetite - 0 0  Feeling bad or failure about yourself  - 0 1  Trouble concentrating - 0 1  Moving slowly or fidgety/restless - 0 0  Suicidal thoughts - 0 0  PHQ-9 Score - 0 8  Difficult doing work/chores - Not difficult at all Somewhat difficult    Social History   Tobacco Use  . Smoking status: Former Smoker    Types: Cigarettes    Quit date: 02/04/1983    Years since quitting: 35.5  . Smokeless tobacco: Former Network engineer Use Topics  . Alcohol use: No    Alcohol/week: 0.0 standard drinks  . Drug use: No    Review of Systems Per HPI unless specifically indicated above     Objective:    BP (!) 120/53   Pulse 68   Temp 98.2 F (36.8 C) (Oral)   Resp 16   Ht 5\' 7"  (1.702 m)   Wt 189 lb (85.7 kg)   BMI 29.60 kg/m   Wt Readings from Last 3 Encounters:  08/17/18 189 lb (85.7 kg)  06/15/18 192 lb (87.1 kg)  04/16/18 186 lb 9.6 oz (84.6 kg)    Physical Exam Vitals signs and nursing note reviewed.  Constitutional:      General: He is not in acute distress.    Appearance: He is well-developed. He is not  diaphoretic.     Comments: Well-appearing, comfortable, cooperative  HENT:     Head: Normocephalic and atraumatic.  Eyes:     General:        Right eye: No discharge.        Left eye: No discharge.     Conjunctiva/sclera: Conjunctivae normal.  Cardiovascular:     Rate and Rhythm: Normal rate.  Pulmonary:     Effort: Pulmonary effort is normal.  Musculoskeletal: Normal range of motion.     Comments: Improved range of motion bilateral shoulder  Skin:    General: Skin is warm and dry.     Findings: No erythema or rash.  Neurological:     Mental Status: He is alert and oriented to person, place, and time.  Psychiatric:        Behavior: Behavior normal.     Comments: Well  groomed, good eye contact, normal speech and thoughts    Results for orders placed or performed in visit on 07/27/18  Lipid panel  Result Value Ref Range   Cholesterol 124 <200 mg/dL   HDL 45 > OR = 40 mg/dL   Triglycerides 87 <150 mg/dL   LDL Cholesterol (Calc) 62 mg/dL (calc)   Total CHOL/HDL Ratio 2.8 <5.0 (calc)   Non-HDL Cholesterol (Calc) 79 <130 mg/dL (calc)  Comprehensive metabolic panel  Result Value Ref Range   Glucose, Bld 164 (H) 65 - 99 mg/dL   BUN 27 (H) 7 - 25 mg/dL   Creat 1.32 (H) 0.70 - 1.11 mg/dL   BUN/Creatinine Ratio 20 6 - 22 (calc)   Sodium 139 135 - 146 mmol/L   Potassium 4.3 3.5 - 5.3 mmol/L   Chloride 107 98 - 110 mmol/L   CO2 22 20 - 32 mmol/L   Calcium 9.2 8.6 - 10.3 mg/dL   Total Protein 6.4 6.1 - 8.1 g/dL   Albumin 4.1 3.6 - 5.1 g/dL   Globulin 2.3 1.9 - 3.7 g/dL (calc)   AG Ratio 1.8 1.0 - 2.5 (calc)   Total Bilirubin 0.5 0.2 - 1.2 mg/dL   Alkaline phosphatase (APISO) 83 35 - 144 U/L   AST 12 10 - 35 U/L   ALT 13 9 - 46 U/L  CBC with Differential/Platelet  Result Value Ref Range   WBC 8.1 3.8 - 10.8 Thousand/uL   RBC 3.87 (L) 4.20 - 5.80 Million/uL   Hemoglobin 12.7 (L) 13.2 - 17.1 g/dL   HCT 37.8 (L) 38.5 - 50.0 %   MCV 97.7 80.0 - 100.0 fL   MCH 32.8 27.0 - 33.0 pg   MCHC 33.6 32.0 - 36.0 g/dL   RDW 12.3 11.0 - 15.0 %   Platelets 244 140 - 400 Thousand/uL   MPV 12.2 7.5 - 12.5 fL   Neutro Abs 3,459 1,500 - 7,800 cells/uL   Lymphs Abs 3,823 850 - 3,900 cells/uL   Absolute Monocytes 640 200 - 950 cells/uL   Eosinophils Absolute 130 15 - 500 cells/uL   Basophils Absolute 49 0 - 200 cells/uL   Neutrophils Relative % 42.7 %   Total Lymphocyte 47.2 %   Monocytes Relative 7.9 %   Eosinophils Relative 1.6 %   Basophils Relative 0.6 %  Hemoglobin A1C w/out eAG  Result Value Ref Range   Hgb A1c MFr Bld 7.4 (H) <5.7 % of total Hgb  TEST AUTHORIZATION  Result Value Ref Range   TEST NAME: HEMOGLOBIN A1c    TEST CODE: [id number]    CLIENT  CONTACT: KELITA  RUSSLE    REPORT ALWAYS MESSAGE SIGNATURE        Assessment & Plan:   Problem List Items Addressed This Visit    Absolute anemia    Improved clinically on oral iron supplement Add vitamin C tab for absorption, may take BID if need F/u      Relevant Medications   ferrous sulfate 325 (65 FE) MG tablet   Chronic right shoulder pain    Improved R shoulder > L shoulder joint pain with known chronic bursitis and OA/DJD S/p prior subacromial injection x 2, last 06/2018 Reassurance f/u if worsening      CKD (chronic kidney disease), stage III (HCC)    Stable Last creatinine 1.3 avg near baseline Secondary complication to DM and HTN Improved water intake Monitor q 6-12 months      Type 2 diabetes, controlled, with neuropathy (HCC) - Primary    Improved DM control A1c 7.4 now at goal Complications - CKD-III, peripheral neuropathy, DM retinopathy No further hypoglycemia OFF Trulicity due to side effect  Plan:  1. Continue Lantus 30u daily, Novolog 6=12 BID wc Encourage improved lifestyle - low carb, low sugar diet, reduce portion size, continue improving regular exercise Check CBG, bring log to next visit for review Continue ASA, ARB, Statin Awaiting request from eye exam / refer         No orders of the defined types were placed in this encounter.     Follow up plan: Return in about 7 months (around 03/20/2019) for Follow-up DM A1c, Anemia, Shoulder.   Nobie Putnam, [facility] 08/17/2018, 8:13 AM

## 2018-08-17 NOTE — Patient Instructions (Addendum)
Thank you for coming to the office today.  Recent Labs    04/01/18 0931 07/28/18 0806  HGBA1C 9.1* 7.4*   Sugar is well controlled. Keep up the good work.  Glad that shoulder is improved.  Continue iron pill once daily with food, add a Vitamin C tablet, or OJ. Can increase iron to twice a day if needed, always take with food.   Please schedule a Follow-up Appointment to: Return in about 7 months (around 03/20/2019) for Follow-up DM A1c, Anemia, Shoulder.  If you have any other questions or concerns, please feel free to call the office or send a message through [place]. You may also schedule an earlier appointment if necessary.  Additionally, you may be receiving a survey about your experience at our office within a few days to 1 week by e-mail or mail. We value your feedback.  Nobie Putnam, DO [place]

## 2018-08-31 ENCOUNTER — Other Ambulatory Visit: Payer: Self-pay

## 2018-08-31 ENCOUNTER — Ambulatory Visit (INDEPENDENT_AMBULATORY_CARE_PROVIDER_SITE_OTHER): Payer: Medicare Other | Admitting: Family Medicine

## 2018-08-31 ENCOUNTER — Encounter: Payer: Self-pay | Admitting: Family Medicine

## 2018-08-31 VITALS — BP 134/52 | HR 72 | Temp 98.5°F | Ht 67.0 in | Wt 191.4 lb

## 2018-08-31 DIAGNOSIS — J3089 Other allergic rhinitis: Secondary | ICD-10-CM

## 2018-08-31 DIAGNOSIS — J0100 Acute maxillary sinusitis, unspecified: Secondary | ICD-10-CM | POA: Diagnosis not present

## 2018-08-31 MED ORDER — AZITHROMYCIN 250 MG PO TABS: ORAL_TABLET | ORAL | 0 refills | Status: DC

## 2018-08-31 MED ORDER — IPRATROPIUM BROMIDE 0.06 % NA SOLN: 2.0000 | Freq: Four times a day (QID) | NASAL | 0 refills | Status: DC

## 2018-08-31 NOTE — Progress Notes (Signed)
Subjective:    Patient ID: Charles York, male    DOB: 26-Apr-1930, 85 y.o.   MRN: [id number]  Charles York is a 85 y.o. male presenting on 08/31/2018 for Ear Pain (intermittent Rt ear pain x 2 days )  Patient presents for a same day appointment.  HPI   Acute Sinusitis / R Ear vs Pharyngeal Pain / Rhinitis Reports symptoms onset for 1 week, now worse in 2 days, with some sinus drainage in AM usually, has happened often before, he has some now developed R sided ear discomfort and pain below ear episodic worse, sometimes sore throat but deeper in throat and side. Using saline spray OTC. - He has PCN allergy - Denies any cough, shortness of breath, fever chills, sweats, purulent drainage, headache, hearing loss or drainage   Depression screen [facility] 2/9 08/17/2018 04/16/2018 04/01/2018  Decreased Interest 0 0 2  Down, Depressed, Hopeless 0 0 2  PHQ - 2 Score 0 0 4  Altered sleeping - 0 0  Tired, decreased energy - 0 2  Change in appetite - 0 0  Feeling bad or failure about yourself  - 0 1  Trouble concentrating - 0 1  Moving slowly or fidgety/restless - 0 0  Suicidal thoughts - 0 0  PHQ-9 Score - 0 8  Difficult doing work/chores - Not difficult at all Somewhat difficult    Social History   Tobacco Use  . Smoking status: Former Smoker    Types: Cigarettes    Quit date: 02/04/1983    Years since quitting: 35.5  . Smokeless tobacco: Former Network engineer Use Topics  . Alcohol use: No    Alcohol/week: 0.0 standard drinks  . Drug use: No    Review of Systems Per HPI unless specifically indicated above     Objective:    BP (!) 134/52 (BP Location: Right Arm, Patient Position: Sitting, Cuff Size: Normal)   Pulse 72   Temp 98.5 F (36.9 C) (Oral)   Ht 5\' 7"  (1.702 m)   Wt 191 lb 6.4 oz (86.8 kg)   BMI 29.98 kg/m   Wt Readings from Last 3 Encounters:  08/31/18 191 lb 6.4 oz (86.8 kg)  08/17/18 189 lb (85.7 kg)  06/15/18 192 lb (87.1 kg)    Physical Exam Vitals signs and  nursing note reviewed.  Constitutional:      General: He is not in acute distress.    Appearance: He is well-developed. He is not diaphoretic.     Comments: Well-appearing, comfortable, cooperative  HENT:     Head: Normocephalic and atraumatic.     Comments: Frontal / maxillary sinuses non-tender. Nares patent without congestion with some turbinate edema without purulence. Bilateral TMs clear with mild effusion without erythema or bulging. Oropharynx clear without erythema, exudates, edema or asymmetry.  Eyes:     General:        Right eye: No discharge.        Left eye: No discharge.     Conjunctiva/sclera: Conjunctivae normal.  Neck:     Musculoskeletal: Normal range of motion and neck supple.     Thyroid: No thyromegaly.  Cardiovascular:     Rate and Rhythm: Normal rate and regular rhythm.     Heart sounds: Normal heart sounds. No murmur.  Pulmonary:     Effort: Pulmonary effort is normal. No respiratory distress.     Breath sounds: Normal breath sounds. No wheezing or rales.  Musculoskeletal: Normal range of motion.  Lymphadenopathy:     Cervical: No cervical adenopathy.  Skin:    General: Skin is warm and dry.     Findings: No erythema or rash.  Neurological:     Mental Status: He is alert and oriented to person, place, and time.  Psychiatric:        Behavior: Behavior normal.     Comments: Well groomed, good eye contact, normal speech and thoughts    Results for orders placed or performed in visit on 07/27/18  Lipid panel  Result Value Ref Range   Cholesterol 124 <200 mg/dL   HDL 45 > OR = 40 mg/dL   Triglycerides 87 <150 mg/dL   LDL Cholesterol (Calc) 62 mg/dL (calc)   Total CHOL/HDL Ratio 2.8 <5.0 (calc)   Non-HDL Cholesterol (Calc) 79 <130 mg/dL (calc)  Comprehensive metabolic panel  Result Value Ref Range   Glucose, Bld 164 (H) 65 - 99 mg/dL   BUN 27 (H) 7 - 25 mg/dL   Creat 1.32 (H) 0.70 - 1.11 mg/dL   BUN/Creatinine Ratio 20 6 - 22 (calc)   Sodium 139  135 - 146 mmol/L   Potassium 4.3 3.5 - 5.3 mmol/L   Chloride 107 98 - 110 mmol/L   CO2 22 20 - 32 mmol/L   Calcium 9.2 8.6 - 10.3 mg/dL   Total Protein 6.4 6.1 - 8.1 g/dL   Albumin 4.1 3.6 - 5.1 g/dL   Globulin 2.3 1.9 - 3.7 g/dL (calc)   AG Ratio 1.8 1.0 - 2.5 (calc)   Total Bilirubin 0.5 0.2 - 1.2 mg/dL   Alkaline phosphatase (APISO) 83 35 - 144 U/L   AST 12 10 - 35 U/L   ALT 13 9 - 46 U/L  CBC with Differential/Platelet  Result Value Ref Range   WBC 8.1 3.8 - 10.8 Thousand/uL   RBC 3.87 (L) 4.20 - 5.80 Million/uL   Hemoglobin 12.7 (L) 13.2 - 17.1 g/dL   HCT 37.8 (L) 38.5 - 50.0 %   MCV 97.7 80.0 - 100.0 fL   MCH 32.8 27.0 - 33.0 pg   MCHC 33.6 32.0 - 36.0 g/dL   RDW 12.3 11.0 - 15.0 %   Platelets 244 140 - 400 Thousand/uL   MPV 12.2 7.5 - 12.5 fL   Neutro Abs 3,459 1,500 - 7,800 cells/uL   Lymphs Abs 3,823 850 - 3,900 cells/uL   Absolute Monocytes 640 200 - 950 cells/uL   Eosinophils Absolute 130 15 - 500 cells/uL   Basophils Absolute 49 0 - 200 cells/uL   Neutrophils Relative % 42.7 %   Total Lymphocyte 47.2 %   Monocytes Relative 7.9 %   Eosinophils Relative 1.6 %   Basophils Relative 0.6 %  Hemoglobin A1C w/out eAG  Result Value Ref Range   Hgb A1c MFr Bld 7.4 (H) <5.7 % of total Hgb  TEST AUTHORIZATION  Result Value Ref Range   TEST NAME: HEMOGLOBIN A1c    TEST CODE: [id number]    CLIENT CONTACT: KELITA RUSSLE    REPORT ALWAYS MESSAGE SIGNATURE        Assessment & Plan:   Problem List Items Addressed This Visit    Allergic rhinitis due to allergen - Primary   Relevant Medications   ipratropium (ATROVENT) 0.06 % nasal spray    Other Visit Diagnoses    Acute non-recurrent maxillary sinusitis       Relevant Medications   ipratropium (ATROVENT) 0.06 % nasal spray   azithromycin (ZITHROMAX Z-PAK) 250 MG tablet  Clinically with mild localized R sided sinus symptoms, has some referred ear pain it seems, no obvious ear pathology at this time, except mild  effusion. Possible sinusitis. - Will cover empirically with Azithromycin Z-pak given >1 week duration, worsening recently - Also Start Atrovent nasal spray decongestant 2 sprays in each nostril up to 4 times daily for 7 days for sinus drainage in AM - Return criteria given  Meds ordered this encounter  Medications  . ipratropium (ATROVENT) 0.06 % nasal spray    Sig: Place 2 sprays into both nostrils 4 (four) times daily. For up to 5-7 days then stop.    Dispense:  15 mL    Refill:  0  . azithromycin (ZITHROMAX Z-PAK) 250 MG tablet    Sig: Take 2 tabs (500mg  total) on Day 1. Take 1 tab (250mg ) daily for next 4 days.    Dispense:  6 tablet    Refill:  0     Follow up plan: Return in about 1 week (around 09/07/2018), or if symptoms worsen or fail to improve, for ear / sinus.   Nobie Putnam, [facility] 08/31/2018, 2:04 PM

## 2018-08-31 NOTE — Patient Instructions (Addendum)
Thank you for coming to the office today.  For drainage - Start Atrovent nasal spray decongestant 2 sprays in each nostril up to 4 times daily for 7 days  May have sinus infection developing or other swollen glandular tissue in this area, can cause pain and swelling at times.  Start Azithromycin Z pak (antibiotic) 2 tabs day 1, then 1 tab x 4 days, complete entire course even if improved  Follow-up as needed if not improve   Please schedule a Follow-up Appointment to: Return in about 1 week (around 09/07/2018), or if symptoms worsen or fail to improve, for ear / sinus.  If you have any other questions or concerns, please feel free to call the office or send a message through [place]. You may also schedule an earlier appointment if necessary.  Additionally, you may be receiving a survey about your experience at our office within a few days to 1 week by e-mail or mail. We value your feedback.  Nobie Putnam, DO [place]

## 2018-09-07 ENCOUNTER — Other Ambulatory Visit: Payer: Self-pay | Admitting: Family Medicine

## 2018-09-07 DIAGNOSIS — J0100 Acute maxillary sinusitis, unspecified: Secondary | ICD-10-CM

## 2018-09-07 DIAGNOSIS — J3089 Other allergic rhinitis: Secondary | ICD-10-CM

## 2018-11-22 ENCOUNTER — Other Ambulatory Visit: Payer: Self-pay | Admitting: Family Medicine

## 2018-11-22 DIAGNOSIS — K219 Gastro-esophageal reflux disease without esophagitis: Secondary | ICD-10-CM

## 2018-11-23 ENCOUNTER — Other Ambulatory Visit: Payer: Self-pay

## 2018-11-23 ENCOUNTER — Ambulatory Visit (INDEPENDENT_AMBULATORY_CARE_PROVIDER_SITE_OTHER): Payer: Medicare Other | Admitting: Family Medicine

## 2018-11-23 ENCOUNTER — Encounter: Payer: Self-pay | Admitting: Family Medicine

## 2018-11-23 VITALS — BP 126/55 | HR 72 | Temp 98.4°F | Resp 16 | Ht 70.0 in | Wt 184.3 lb

## 2018-11-23 DIAGNOSIS — R1013 Epigastric pain: Secondary | ICD-10-CM | POA: Diagnosis not present

## 2018-11-23 DIAGNOSIS — F4322 Adjustment disorder with anxiety: Secondary | ICD-10-CM

## 2018-11-23 DIAGNOSIS — D508 Other iron deficiency anemias: Secondary | ICD-10-CM | POA: Diagnosis not present

## 2018-11-23 MED ORDER — BUSPIRONE HCL 5 MG PO TABS: 5.0000 mg | ORAL_TABLET | Freq: Two times a day (BID) | ORAL | 2 refills | Status: DC | PRN

## 2018-11-23 NOTE — Progress Notes (Addendum)
Subjective:    Patient ID: Charles York, male    DOB: Sep 12, 1930, 85 y.o.   MRN: [id number]  Charles York is a 85 y.o. male presenting on 11/23/2018 for Abdominal Pain   HPI   Indigestion/GERD/Epigastric pain / Anxiety vs Stress Previously patient has been managed on Omeprazole 20mg  daily for GERD, now seems some worsening in past few weeks epigastric discomfort. He denies significant heartburn. He also admits now eating late evening, and seems worse after goes to bed. Also admits stress/tension impacting his daily function, says difficulty focusing at times - denies depression. - He has history of Anemia - He is on Ferrous sulfate 325mg  daily due to anemia. He has been on this for a while now, tolerating well but not sure if it causes indigestion  Admits tension and stress he feels some anxiety or difficulty with tasks he normally used to be able to do and cause frustration. In past prior failed zoloft 25-50mg  side effect BMs normal Denies dark stool or blood in stool anymore, nausea vomiting, chest pain or dyspnea   Depression screen [facility] 2/9 11/23/2018 08/17/2018 04/16/2018  Decreased Interest 0 0 0  Down, Depressed, Hopeless 0 0 0  PHQ - 2 Score 0 0 0  Altered sleeping 3 - 0  Tired, decreased energy 3 - 0  Change in appetite 2 - 0  Feeling bad or failure about yourself  0 - 0  Trouble concentrating 0 - 0  Moving slowly or fidgety/restless 2 - 0  Suicidal thoughts 0 - 0  PHQ-9 Score 10 - 0  Difficult doing work/chores - - Not difficult at all   GAD 7 : Generalized Anxiety Score 04/01/2018  Nervous, Anxious, on Edge 1  Control/stop worrying 2  Worry too much - different things 1  Trouble relaxing 1  Restless 1  Easily annoyed or irritable 1  Afraid - awful might happen 0  Total GAD 7 Score 7  Anxiety Difficulty Somewhat difficult     Social History   Tobacco Use  . Smoking status: Former Smoker    Types: Cigarettes    Quit date: 02/04/1983    Years since quitting:  35.8  . Smokeless tobacco: Former Network engineer Use Topics  . Alcohol use: No    Alcohol/week: 0.0 standard drinks  . Drug use: No    Review of Systems Per HPI unless specifically indicated above     Objective:    BP (!) 126/55 (BP Location: Left Arm, Patient Position: Sitting, Cuff Size: Normal)   Pulse 72   Temp 98.4 F (36.9 C) (Oral)   Resp 16   Ht 5\' 10"  (1.778 m)   Wt 184 lb 4.8 oz (83.6 kg)   SpO2 98%   BMI 26.44 kg/m   Wt Readings from Last 3 Encounters:  11/23/18 184 lb 4.8 oz (83.6 kg)  08/31/18 191 lb 6.4 oz (86.8 kg)  08/17/18 189 lb (85.7 kg)    Physical Exam Vitals signs and nursing note reviewed.  Constitutional:      General: He is not in acute distress.    Appearance: He is well-developed. He is not diaphoretic.     Comments: Well-appearing, comfortable, cooperative  HENT:     Head: Normocephalic and atraumatic.  Eyes:     General:        Right eye: No discharge.        Left eye: No discharge.     Conjunctiva/sclera: Conjunctivae normal.  Cardiovascular:  Rate and Rhythm: Normal rate.  Pulmonary:     Effort: Pulmonary effort is normal.  Abdominal:     General: Bowel sounds are increased.     Tenderness: There is no abdominal tenderness. There is no guarding or rebound. Negative signs include Murphy's sign.  Skin:    General: Skin is warm and dry.     Findings: No erythema or rash.  Neurological:     Mental Status: He is alert and oriented to person, place, and time.  Psychiatric:        Mood and Affect: Mood is not anxious or depressed.        Behavior: Behavior normal.     Comments: Well groomed, good eye contact, normal speech and thoughts    Results for orders placed or performed in visit on 07/27/18  Lipid panel  Result Value Ref Range   Cholesterol 124 <200 mg/dL   HDL 45 > OR = 40 mg/dL   Triglycerides 87 <150 mg/dL   LDL Cholesterol (Calc) 62 mg/dL (calc)   Total CHOL/HDL Ratio 2.8 <5.0 (calc)   Non-HDL Cholesterol  (Calc) 79 <130 mg/dL (calc)  Comprehensive metabolic panel  Result Value Ref Range   Glucose, Bld 164 (H) 65 - 99 mg/dL   BUN 27 (H) 7 - 25 mg/dL   Creat 1.32 (H) 0.70 - 1.11 mg/dL   BUN/Creatinine Ratio 20 6 - 22 (calc)   Sodium 139 135 - 146 mmol/L   Potassium 4.3 3.5 - 5.3 mmol/L   Chloride 107 98 - 110 mmol/L   CO2 22 20 - 32 mmol/L   Calcium 9.2 8.6 - 10.3 mg/dL   Total Protein 6.4 6.1 - 8.1 g/dL   Albumin 4.1 3.6 - 5.1 g/dL   Globulin 2.3 1.9 - 3.7 g/dL (calc)   AG Ratio 1.8 1.0 - 2.5 (calc)   Total Bilirubin 0.5 0.2 - 1.2 mg/dL   Alkaline phosphatase (APISO) 83 35 - 144 U/L   AST 12 10 - 35 U/L   ALT 13 9 - 46 U/L  CBC with Differential/Platelet  Result Value Ref Range   WBC 8.1 3.8 - 10.8 Thousand/uL   RBC 3.87 (L) 4.20 - 5.80 Million/uL   Hemoglobin 12.7 (L) 13.2 - 17.1 g/dL   HCT 37.8 (L) 38.5 - 50.0 %   MCV 97.7 80.0 - 100.0 fL   MCH 32.8 27.0 - 33.0 pg   MCHC 33.6 32.0 - 36.0 g/dL   RDW 12.3 11.0 - 15.0 %   Platelets 244 140 - 400 Thousand/uL   MPV 12.2 7.5 - 12.5 fL   Neutro Abs 3,459 1,500 - 7,800 cells/uL   Lymphs Abs 3,823 850 - 3,900 cells/uL   Absolute Monocytes 640 200 - 950 cells/uL   Eosinophils Absolute 130 15 - 500 cells/uL   Basophils Absolute 49 0 - 200 cells/uL   Neutrophils Relative % 42.7 %   Total Lymphocyte 47.2 %   Monocytes Relative 7.9 %   Eosinophils Relative 1.6 %   Basophils Relative 0.6 %  Hemoglobin A1C w/out eAG  Result Value Ref Range   Hgb A1c MFr Bld 7.4 (H) <5.7 % of total Hgb  TEST AUTHORIZATION  Result Value Ref Range   TEST NAME: HEMOGLOBIN A1c    TEST CODE: [id number]    CLIENT CONTACT: KELITA RUSSLE    REPORT ALWAYS MESSAGE SIGNATURE        Assessment & Plan:   Problem List Items Addressed This Visit    Absolute anemia  Other Visit Diagnoses    Epigastric discomfort    -  Primary   Dyspepsia       Adjustment disorder with anxiety       Relevant Medications   busPIRone (BUSPAR) 5 MG tablet      Likely  multifactorial, can be underlying GERD / tension/stress vs anxiety, and possible side effect intolerance on oral iron  Change eating habit to avoid late night eating prior to bed Goal smaller frequent meals  Start Buspar 5mg  1-2 times daily - can transition to PRN only if preferred, goal to calm down some daily anxiety / stress. Intolerance to prior Sertraline low dose, will HOLD off on other SSRI for now instead. - May resolve indigestion.  Reduce oral iron now to every OTHER day, avoid side effect, take with food  Next if still unresolved - can double dose Omeprazole 20mg  up to 40mg  daily - notify if need new rx sent.  Reviewed strict return criteria if not improving or worsening, when to seek care  Follow-up if not improved, consider further options and diagnostic   Meds ordered this encounter  Medications  . busPIRone (BUSPAR) 5 MG tablet    Sig: Take 1 tablet (5 mg total) by mouth 2 (two) times daily as needed.    Dispense:  60 tablet    Refill:  2     Follow up plan: Return in about 4 weeks (around 12/21/2018), or if symptoms worsen or fail to improve, for stomach pain.   Nobie Putnam, DO [facility] 11/23/2018, 3:07 PM

## 2018-11-23 NOTE — Patient Instructions (Addendum)
Thank you for coming to the office today.  For indigestion and symptoms, may be due to heartburn or other causes such as anxiety/tension and iron pill.  Start with new medication - Buspar 5mg  - take this 1-2 times a day, if you want to keep it going every day that is fine. You can take it up to twice a day every day, if this is too much or you prefer to not take it every day you can limit it to only as needed.  If we improve your stress/tension on this med then maybe your digestive symptoms will improve.  Reduce your Iron supplement 65mg  to every OTHER day now. This can help reduce indigestion.  If still not improved by 2-4 weeks you can DOUBLE Omeprazole 20mg  up to 40mg  - take two of the 20mg  pills every day at once, and we can order NEW rx 40mg  in future if needed.  Please schedule a Follow-up Appointment to: Return in about 4 weeks (around 12/21/2018), or if symptoms worsen or fail to improve, for stomach pain.  If you have any other questions or concerns, please feel free to call the office or send a message through [place]. You may also schedule an earlier appointment if necessary.  Additionally, you may be receiving a survey about your experience at our office within a few days to 1 week by e-mail or mail. We value your feedback.  Nobie Putnam, DO [place]

## 2018-12-29 ENCOUNTER — Emergency Department: Payer: Medicare Other

## 2018-12-29 ENCOUNTER — Encounter: Payer: Self-pay | Admitting: Emergency Medicine

## 2018-12-29 ENCOUNTER — Other Ambulatory Visit: Payer: Self-pay

## 2018-12-29 ENCOUNTER — Emergency Department
Admission: EM | Admit: 2018-12-29 | Discharge: 2018-12-29 | Disposition: A | Discharge status: Home / Self Care | Payer: Medicare Other | Attending: Emergency Medicine | Admitting: Emergency Medicine

## 2018-12-29 DIAGNOSIS — E1122 Type 2 diabetes mellitus with diabetic chronic kidney disease: Secondary | ICD-10-CM | POA: Diagnosis not present

## 2018-12-29 DIAGNOSIS — Z85828 Personal history of other malignant neoplasm of skin: Secondary | ICD-10-CM | POA: Insufficient documentation

## 2018-12-29 DIAGNOSIS — Z87891 Personal history of nicotine dependence: Secondary | ICD-10-CM | POA: Diagnosis not present

## 2018-12-29 DIAGNOSIS — Z794 Long term (current) use of insulin: Secondary | ICD-10-CM | POA: Insufficient documentation

## 2018-12-29 DIAGNOSIS — E1140 Type 2 diabetes mellitus with diabetic neuropathy, unspecified: Secondary | ICD-10-CM | POA: Diagnosis not present

## 2018-12-29 DIAGNOSIS — R531 Weakness: Secondary | ICD-10-CM | POA: Diagnosis not present

## 2018-12-29 DIAGNOSIS — G459 Transient cerebral ischemic attack, unspecified: Secondary | ICD-10-CM | POA: Diagnosis not present

## 2018-12-29 DIAGNOSIS — Z79899 Other long term (current) drug therapy: Secondary | ICD-10-CM | POA: Insufficient documentation

## 2018-12-29 DIAGNOSIS — I771 Stricture of artery: Secondary | ICD-10-CM | POA: Diagnosis not present

## 2018-12-29 DIAGNOSIS — R200 Anesthesia of skin: Secondary | ICD-10-CM | POA: Diagnosis not present

## 2018-12-29 DIAGNOSIS — N183 Chronic kidney disease, stage 3 unspecified: Secondary | ICD-10-CM | POA: Diagnosis not present

## 2018-12-29 DIAGNOSIS — I129 Hypertensive chronic kidney disease with stage 1 through stage 4 chronic kidney disease, or unspecified chronic kidney disease: Secondary | ICD-10-CM | POA: Insufficient documentation

## 2018-12-29 LAB — COMPREHENSIVE METABOLIC PANEL
ALT: 17 U/L (ref 0–44)
AST: 18 U/L (ref 15–41)
Albumin: 4.1 g/dL (ref 3.5–5.0)
Alkaline Phosphatase: 82 U/L (ref 38–126)
Anion gap: 10 (ref 5–15)
BUN: 22 mg/dL (ref 8–23)
CO2: 27 mmol/L (ref 22–32)
Calcium: 9.3 mg/dL (ref 8.9–10.3)
Chloride: 103 mmol/L (ref 98–111)
Creatinine, Ser: 1.29 mg/dL — ABNORMAL HIGH (ref 0.61–1.24)
GFR calc Af Amer: 58 mL/min — ABNORMAL LOW (ref >60)
GFR calc non Af Amer: 50 mL/min — ABNORMAL LOW (ref >60)
Glucose, Bld: 195 mg/dL — ABNORMAL HIGH (ref 70–99)
Potassium: 4.1 mmol/L (ref 3.5–5.1)
Sodium: 140 mmol/L (ref 135–145)
Total Bilirubin: 1.1 mg/dL (ref 0.3–1.2)
Total Protein: 7.4 g/dL (ref 6.5–8.1)

## 2018-12-29 LAB — CBC
HCT: 38.2 % — ABNORMAL LOW (ref 39.0–52.0)
Hemoglobin: 13.3 g/dL (ref 13.0–17.0)
MCH: 32 pg (ref 26.0–34.0)
MCHC: 34.8 g/dL (ref 30.0–36.0)
MCV: 92 fL (ref 80.0–100.0)
Platelets: 194 10*3/uL (ref 150–400)
RBC: 4.15 MIL/uL — ABNORMAL LOW (ref 4.22–5.81)
RDW: 12.6 % (ref 11.5–15.5)
WBC: 8 10*3/uL (ref 4.0–10.5)
nRBC: 0 % (ref 0.0–0.2)

## 2018-12-29 LAB — GLUCOSE, CAPILLARY: Glucose-Capillary: 181 mg/dL — ABNORMAL HIGH (ref 70–99)

## 2018-12-29 MED ORDER — ASPIRIN EC 325 MG PO TBEC: 325.0000 mg | DELAYED_RELEASE_TABLET | Freq: Every day | ORAL | 0 refills | Status: DC

## 2018-12-29 MED ORDER — IOHEXOL 350 MG/ML SOLN
75.0000 mL | Freq: Once | INTRAVENOUS | Status: AC | PRN
  Administered 2018-12-29: 75 mL via INTRAVENOUS

## 2018-12-29 NOTE — ED Notes (Signed)
Patient transported to MRI 

## 2018-12-29 NOTE — ED Triage Notes (Signed)
Pt states around 0400 this am, right hand went numb, left mouth numbness as well, and left shoulder pain, all have since resolved. Denies cva in the past. BG in triage 181. Speech clear. Took bp med before he left for ED, 169/81 in triage. NAD.

## 2018-12-29 NOTE — ED Notes (Signed)
Patient transported to CT 

## 2018-12-29 NOTE — ED Notes (Signed)
Pt given snack and meal tray.

## 2018-12-29 NOTE — ED Notes (Signed)
Pt ambulated to the bathroom, states that he feels weak in his left left but was able to ambulate independently with no assistance.

## 2018-12-29 NOTE — ED Provider Notes (Addendum)
[facility] Emergency Department Provider Note  Time seen: 9:54 AM  I have reviewed the triage vital signs and the nursing notes.   HISTORY  Chief Complaint Numbness   HPI Charles York is a 85 y.o. male with a past medical history of anxiety, arthritis, gastric reflux, hypertension, hyperlipidemia presents to the emergency department for left-sided weakness.  According to the patient he woke around 4:00 this morning with a sensation of weakness and numbness to his left face and left arm.  Patient states around his lips on the left side as well as his left face he cannot feel much of anything, was having trouble using the left arm and felt like the left arm was numb.  Patient continues to state some numbness sensation to the left face and left arm although states it has improved significantly from earlier this morning.  Denies any headache at any point.  Denies any right-sided symptoms.  Denies any leg symptoms.  No history of stroke previously per patient.  Largely negative review of systems otherwise.   Past Medical History:  Diagnosis Date  . Anxiety   . Arthritis   . Cancer ([place])    skin  . Chronic kidney disease    stage 3  . Colon polyp   . GERD (gastroesophageal reflux disease)   . Heartburn   . Hyperlipemia   . Hypertension   . Obesity     Patient Active Problem List   Diagnosis Date Noted  . Chronic right shoulder pain 08/17/2018  . Absolute anemia 07/30/2018  . Primary osteoarthritis involving multiple joints 06/15/2018  . Constipation 02/24/2017  . GERD (gastroesophageal reflux disease) 11/18/2016  . Diabetic retinopathy ([place]) 11/18/2016  . Hyperlipidemia associated with type 2 diabetes mellitus ([place]) 11/18/2016  . Daytime sleepiness 07/15/2016  . Fatigue 07/15/2016  . Presbycusis of both ears 07/15/2016  . Tinnitus of right ear 07/15/2016  . Allergic rhinitis due to allergen 06/09/2016  . CKD (chronic kidney disease), stage III  02/27/2016  . BPPV (benign paroxysmal positional vertigo), left 02/27/2016  . Chronic pansinusitis 02/27/2016  . Arthritis 07/03/2015  . Colon polyp 07/03/2015  . Essential hypertension 07/03/2015  . High cholesterol 07/03/2015  . Type 2 diabetes, controlled, with neuropathy ([place]) 07/03/2015    Past Surgical History:  Procedure Laterality Date  . CHOLECYSTECTOMY N/A 02/04/2018   Procedure: LAPAROSCOPIC CHOLECYSTECTOMY;  Surgeon: Johnathan Hausen, MD;  Location: [facility] ORS;  Service: General;  Laterality: N/A;  . EYE SURGERY  11/2017   fluid in back of eye/ injection every 3 months. done at [place]  . EYE SURGERY Bilateral    cataracts extracted    Prior to Admission medications   Medication Sig Start Date End Date Taking? Authorizing Provider  atorvastatin (LIPITOR) 80 MG tablet Take 40 mg by mouth at bedtime. Pt take 1/2 pill daily at bed time     [provider]  busPIRone (BUSPAR) 5 MG tablet Take 1 tablet (5 mg total) by mouth 2 (two) times daily as needed. 11/23/18   Karamalegos, Devonne Doughty, DO  ferrous sulfate 325 (65 FE) MG tablet Take 325 mg by mouth every other day.    [provider]  hydroxypropyl methylcellulose / hypromellose (ISOPTO TEARS / GONIOVISC) 2.5 % ophthalmic solution Place 1 drop into both eyes 4 (four) times daily.    [provider]  insulin aspart (NOVOLOG) 100 UNIT/ML injection Inject 12 Units into the skin 2 (two) times daily.     [provider]  insulin glargine (LANTUS) 100 UNIT/ML injection Inject 25 Units into the skin daily after breakfast.     [provider]  ipratropium (ATROVENT) 0.06 % nasal spray USE 2 SPRAYS IN EACH NOSTRIL FOUR TIMES DAILY FOR UP TO 5 TO 7 DAYS THEN STOP 09/07/18   Karamalegos, Devonne Doughty, DO  losartan (COZAAR) 25 MG tablet Take 25 mg by mouth daily.    [provider]  omeprazole (PRILOSEC) 20 MG capsule TAKE 1 CAPSULE(20 MG) BY MOUTH DAILY BEFORE BREAKFAST 11/23/18   Karamalegos,  Devonne Doughty, DO  vitamin B-12 (CYANOCOBALAMIN) 1000 MCG tablet Take 1,000 mcg by mouth daily.    [provider]    Allergies  Allergen Reactions  . Trulicity [Dulaglutide] Swelling    Swollen lips, shortness of breath,   . Penicillins Itching and Rash    Has patient had a PCN reaction causing immediate rash, facial/tongue/throat swelling, SOB or lightheadedness with hypotension: No Has patient had a PCN reaction causing severe rash involving mucus membranes or skin necrosis: No Has patient had a PCN reaction that required hospitalization: No Has patient had a PCN reaction occurring within the last 10 years: No If all of the above answers are "NO", then may proceed with Cephalosporin use.     Family History  Problem Relation Age of Onset  . Cancer Father        carcinoma  . Alzheimer's disease Mother     Social History Social History   Tobacco Use  . Smoking status: Former Smoker    Types: Cigarettes    Quit date: 02/04/1983    Years since quitting: 35.9  . Smokeless tobacco: Former Network engineer Use Topics  . Alcohol use: No    Alcohol/week: 0.0 standard drinks  . Drug use: No    Review of Systems Constitutional: Negative for fever. Cardiovascular: Negative for chest pain. Respiratory: Negative for shortness of breath. Gastrointestinal: Negative for abdominal pain Musculoskeletal: Negative for musculoskeletal complaints Skin: Negative for skin complaints  Neurological: Negative for headache left-sided facial and arm numbness/weakness since 4 AM.  Last known normal was last night before going to bed. All other ROS negative  ____________________________________________   PHYSICAL EXAM:  VITAL SIGNS: ED Triage Vitals  Enc Vitals Group     BP 12/29/18 0923 (!) 169/81     Pulse Rate 12/29/18 0923 84     Resp 12/29/18 0923 18     Temp 12/29/18 0923 98.2 F (36.8 C)     Temp Source 12/29/18 0923 Oral     SpO2 12/29/18 0923 99 %     Weight 12/29/18  0924 188 lb (85.3 kg)     Height 12/29/18 0924 5\' 9"  (1.753 m)     Head Circumference --      Peak Flow --      Pain Score 12/29/18 0924 5     Pain Loc --      Pain Edu? --      Excl. in [place]? --    Constitutional: Alert and oriented. Well appearing and in no distress. Eyes: Normal exam ENT      Head: Normocephalic and atraumatic.      Mouth/Throat: Mucous membranes are moist. Cardiovascular: Normal rate, regular rhythm. Respiratory: Normal respiratory effort without tachypnea nor retractions. Breath sounds are clear  Gastrointestinal: Soft and nontender. No distention. Musculoskeletal: Nontender with normal range of motion in all extremities.  Neurologic: Normal speech and language.  Patient does have a slight left-sided facial droop,  equal grip strength with no pronator drift, however the patient is not able to fully extend his left fingers.  5/5 motor in lower extremities without drift. Skin:  Skin is warm, dry and intact.  Psychiatric: Mood and affect are normal.   ____________________________________________    EKG  EKG viewed and interpreted by myself shows a normal sinus rhythm 85 bpm with a narrow QRS, normal axis, normal intervals, no concerning ST changes.  ____________________________________________    RADIOLOGY  CT head shows no acute abnormality.  Small low-attenuation area in the right pons. MRI is negative for acute abnormality.  ____________________________________________   INITIAL IMPRESSION / ASSESSMENT AND PLAN / ED COURSE  Pertinent labs & imaging results that were available during my care of the patient were reviewed by me and considered in my medical decision making (see chart for details).   Patient presents to the emergency department for left face and arm weakness/numbness starting at 4 AM this morning, last known normal was last night.  Differential would include TIA, CVA, paresthesias.  We will check labs, EKG, CT scan of the head and continue to  closely monitor.  I anticipate the patient requiring admission to the hospital given the high likelihood of CVA.  Patient is MRI is read as negative.  Dr. Irish Elders has seen the patient in the emergency department and as the symptoms have resolved he recommends obtaining ultrasound duplex of the carotid arteries.  If there is no significant stenosis/obstruction recommends outpatient follow-up for echocardiogram and starting the patient on 325 mg of aspirin.  Patient agreeable to plan of care would prefer discharge over admission if possible.  Had a discussion with ultrasound, they are unable to perform carotid duplex ultrasounds from the emergency department due to policy.  I have discontinued the ultrasounds and I have ordered a CT angiography of the neck to further evaluate.  If CTA the neck shows no significant stenosis I would anticipate likely discharge home with outpatient follow-up.  Patient agreeable to plan of care.  Patient care signed out to oncoming physician.  CAYMEN BUMANGLAG was evaluated in Emergency Department on 12/29/2018 for the symptoms described in the history of present illness. He was evaluated in the context of the global COVID-19 pandemic, which necessitated consideration that the patient might be at risk for infection with the SARS-CoV-2 virus that causes COVID-19. Institutional protocols and algorithms that pertain to the evaluation of patients at risk for COVID-19 are in a state of rapid change based on information released by regulatory bodies including the CDC and federal and state organizations. These policies and algorithms were followed during the patient's care in the ED.  ____________________________________________   FINAL CLINICAL IMPRESSION(S) / ED DIAGNOSES  Left facial and arm weakness Transient ischemic attack   Harvest Dark, MD 12/29/18 1445    Harvest Dark, MD 12/29/18 1501

## 2018-12-29 NOTE — Consult Note (Signed)
Reason for Consult: L sided numbness  Referring Physician: Dr. Kerman Passey   CC: L sided numbness   HPI: Charles York is an 85 y.o. male with a past medical history of anxiety, arthritis, gastric reflux, hypertension, hyperlipidemia, DM presents to the emergency department for left-sided weakness. Pt states he went to sleep at 10 PM and e woke around 4:00 this morning with a sensation of weakness and numbness to his left face and left arm.  Those symptoms have resolved. Currently states minimal perioral numbness that is resolving.  No clear other focality on examination. No prior history of stroke and no anti platelet therapy prior to admission    Past Medical History:  Diagnosis Date  . Anxiety   . Arthritis   . Cancer ([place])    skin  . Chronic kidney disease    stage 3  . Colon polyp   . GERD (gastroesophageal reflux disease)   . Heartburn   . Hyperlipemia   . Hypertension   . Obesity     Past Surgical History:  Procedure Laterality Date  . CHOLECYSTECTOMY N/A 02/04/2018   Procedure: LAPAROSCOPIC CHOLECYSTECTOMY;  Surgeon: Johnathan Hausen, MD;  Location: [facility] ORS;  Service: General;  Laterality: N/A;  . EYE SURGERY  11/2017   fluid in back of eye/ injection every 3 months. done at [place]  . EYE SURGERY Bilateral    cataracts extracted    Family History  Problem Relation Age of Onset  . Cancer Father        carcinoma  . Alzheimer's disease Mother     Social History:  reports that he quit smoking about 35 years ago. His smoking use included cigarettes. He has quit using smokeless tobacco. He reports that he does not drink alcohol or use drugs.  Allergies  Allergen Reactions  . Trulicity [Dulaglutide] Swelling    Swollen lips, shortness of breath,   . Penicillins Itching and Rash    Has patient had a PCN reaction causing immediate rash, facial/tongue/throat swelling, SOB or lightheadedness with hypotension: No Has patient had a PCN reaction causing severe rash involving  mucus membranes or skin necrosis: No Has patient had a PCN reaction that required hospitalization: No Has patient had a PCN reaction occurring within the last 10 years: No If all of the above answers are "NO", then may proceed with Cephalosporin use.     Medications: I have reviewed the patient's current medications.  ROS: History obtained from the patient  General ROS: negative for - chills, fatigue, fever, night sweats, weight gain or weight loss Psychological ROS: negative for - behavioral disorder, hallucinations, memory difficulties, mood swings or suicidal ideation Ophthalmic ROS: negative for - blurry vision, double vision, eye pain or loss of vision ENT ROS: negative for - epistaxis, nasal discharge, oral lesions, sore throat, tinnitus or vertigo Allergy and Immunology ROS: negative for - hives or itchy/watery eyes Hematological and Lymphatic ROS: negative for - bleeding problems, bruising or swollen lymph nodes Endocrine ROS: negative for - galactorrhea, hair pattern changes, polydipsia/polyuria or temperature intolerance Respiratory ROS: negative for - cough, hemoptysis, shortness of breath or wheezing Cardiovascular ROS: negative for - chest pain, dyspnea on exertion, edema or irregular heartbeat Gastrointestinal ROS: negative for - abdominal pain, diarrhea, hematemesis, nausea/vomiting or stool incontinence Genito-Urinary ROS: negative for - dysuria, hematuria, incontinence or urinary frequency/urgency Musculoskeletal ROS: negative for - joint swelling or muscular weakness Neurological ROS: as noted in HPI Dermatological ROS: negative for rash and skin lesion changes  Physical  Examination: Blood pressure (!) 150/62, pulse 71, temperature 98.2 F (36.8 C), temperature source Oral, resp. rate 18, height 5\' 9"  (1.753 m), weight 85.3 kg, SpO2 96 %.   Neurological Examination   Mental Status: Alert, oriented, thought content appropriate.  Speech fluent without evidence of  aphasia.  Able to follow 3 step commands without difficulty. Cranial Nerves: II: Discs flat bilaterally; Visual fields grossly normal, pupils equal, round, reactive to light and accommodation III,IV, VI: ptosis not present, extra-ocular motions intact bilaterally V,VII: smile symmetric, facial light touch sensation normal bilaterally VIII: hearing normal bilaterally IX,X: gag reflex present XI: bilateral shoulder shrug XII: midline tongue extension Motor: Right : Upper extremity   5/5    Left:     Upper extremity   5/5  Lower extremity   5/5     Lower extremity   5/5 Tone and bulk:normal tone throughout; no atrophy noted Sensory: subjective perioral numbness  Deep Tendon Reflexes: 1+ and symmetric throughout Plantars: Right: downgoing   Left: downgoing Cerebellar: normal finger-to-nose, normal rapid alternating movements and normal heel-to-shin test Gait: was able to stand up and ambulate on own.       Laboratory Studies:   Basic Metabolic Panel: Recent Labs  Lab 12/29/18 1002  NA 140  K 4.1  CL 103  CO2 27  GLUCOSE 195*  BUN 22  CREATININE 1.29*  CALCIUM 9.3    Liver Function Tests: Recent Labs  Lab 12/29/18 1002  AST 18  ALT 17  ALKPHOS 82  BILITOT 1.1  PROT 7.4  ALBUMIN 4.1   No results for input(s): LIPASE, AMYLASE in the last 168 hours. No results for input(s): AMMONIA in the last 168 hours.  CBC: Recent Labs  Lab 12/29/18 1002  WBC 8.0  HGB 13.3  HCT 38.2*  MCV 92.0  PLT 194    Cardiac Enzymes: No results for input(s): CKTOTAL, CKMB, CKMBINDEX, TROPONINI in the last 168 hours.  BNP: Invalid input(s): POCBNP  CBG: Recent Labs  Lab 12/29/18 0922  GLUCAP 181*    Microbiology: No results found for this or any previous visit.  Coagulation Studies: No results for input(s): LABPROT, INR in the last 72 hours.  Urinalysis: No results for input(s): COLORURINE, LABSPEC, PHURINE, GLUCOSEU, HGBUR, BILIRUBINUR, KETONESUR, PROTEINUR,  UROBILINOGEN, NITRITE, LEUKOCYTESUR in the last 168 hours.  Invalid input(s): APPERANCEUR  Lipid Panel:     Component Value Date/Time   CHOL 124 07/28/2018 0806   TRIG 87 07/28/2018 0806   HDL 45 07/28/2018 0806   CHOLHDL 2.8 07/28/2018 0806   LDLCALC 62 07/28/2018 0806    HgbA1C:  Lab Results  Component Value Date   HGBA1C 7.4 (H) 07/28/2018    Urine Drug Screen:  No results found for: LABOPIA, COCAINSCRNUR, LABBENZ, AMPHETMU, THCU, LABBARB  Alcohol Level: No results for input(s): ETH in the last 168 hours.  Other results: EKG: normal EKG, normal sinus rhythm, unchanged from previous tracings.  Imaging: Ct Head Wo Contrast  Result Date: 12/29/2018 CLINICAL DATA:  Left-sided weakness and numbness, now resolved EXAM: CT HEAD WITHOUT CONTRAST TECHNIQUE: Contiguous axial images were obtained from the base of the skull through the vertex without intravenous contrast. COMPARISON:  2006 FINDINGS: Brain: There is no acute intracranial hemorrhage, mass-effect, or edema. Gray-white differentiation is preserved. Small area of low attenuation the anterior right pons is favored to be artifactual. There is no extra-axial fluid collection. Ventricles and sulci are within normal limits in size and configuration. Patchy hypoattenuation in the supratentorial white matter  is nonspecific but may reflect mild chronic microvascular ischemic changes. Vascular: No hyperdense vessel. Mild intracranial atherosclerotic calcification at the skull base. Skull: Calvarium is unremarkable. Sinuses/Orbits: Right posterior ethmoid opacification. No acute orbital finding. Other: None. IMPRESSION: No acute intracranial hemorrhage, mass effect, or definite evidence of acute infarction. Small area of low attenuation in the anterior right pons is favored to be artifactual. If there is concern for acute ischemia, MRI could be obtained. Mild chronic microvascular ischemic changes. Electronically Signed   By: Macy Mis  M.D.   On: 12/29/2018 10:18   Mr Brain Wo Contrast  Result Date: 12/29/2018 CLINICAL DATA:  Left-sided weakness and numbness. EXAM: MRI HEAD WITHOUT CONTRAST TECHNIQUE: Multiplanar, multiecho pulse sequences of the brain and surrounding structures were obtained without intravenous contrast. COMPARISON:  Head CT 12/29/2018 FINDINGS: Brain: There is no evidence of acute infarct, intracranial hemorrhage, mass, midline shift, or extra-axial fluid collection. The ventricles and sulci are within normal limits for age. T2 hyperintensities in the cerebral white matter bilaterally are nonspecific but compatible with mild chronic small vessel ischemic disease. No abnormality is evident in the pons, and the small focus of low attenuation on CT was artifactual. Vascular: Major intracranial vascular flow voids are preserved. Skull and upper cervical spine: Unremarkable bone marrow signal. Sinuses/Orbits: Bilateral cataract extraction. Right posterior ethmoid air cell opacification. Moderate right mastoid effusion. Other: None. IMPRESSION: 1. No acute intracranial abnormality. 2. Mild chronic small vessel ischemic disease. Electronically Signed   By: Logan Bores M.D.   On: 12/29/2018 12:08     Assessment/Plan:  85 y.o. male with a past medical history of anxiety, arthritis, gastric reflux, hypertension, hyperlipidemia, DM presents to the emergency department for left-sided weakness. Pt states he went to sleep at 10 PM and e woke around 4:00 this morning with a sensation of weakness and numbness to his left face and left arm.  Those symptoms have resolved. Currently states minimal perioral numbness that is resolving.  No clear other focality on examination. No prior history of stroke and no anti platelet therapy prior to admission   - TIA symptoms - CTH and MRI brain no acute abnormalities - Pt is nearly back to baseline.   - Start ASA 325 daily - Carotid US  - 2decho can be done as out patient - Neurology  follow up as out patient  - d/c planning 12/29/2018, 1:17 PM

## 2018-12-29 NOTE — ED Notes (Signed)
Pt returned to room 08 from MRI.

## 2018-12-29 NOTE — ED Notes (Signed)
ED Provider at bedside. 

## 2018-12-29 NOTE — ED Notes (Signed)
Pt ambulated to bathroom independently with steady gait.

## 2018-12-29 NOTE — Discharge Instructions (Addendum)
Please follow-up with your doctor soon as possible for recheck/reevaluation and to arrange an outpatient echocardiogram of your heart.  Return to the emergency department for any further episodes of weakness or numbness any significant headache, difficulty speaking, thinking, or any other symptom personally concerning to yourself.

## 2018-12-29 NOTE — ED Notes (Signed)
Pt alert and oriented X 4, stable for discharge. RR even and unlabored, color WNL. Discussed discharge instructions and follow up when appropriate. Instructed to follow up with ER for any life threatening symptoms or concerns that patient or family of patient may have  

## 2018-12-29 NOTE — ED Notes (Addendum)
Pt reports that he woke up around 0430 this morning and experienced numbness in his left hand. States that he then began to experience numbness on the left side of his face and lips.  Denies numbness or weakness in left leg. Experiencing decreased sensation on left side of face, sensation equal on both arms and legs. AO x4, no slurred speech noted.

## 2019-01-07 ENCOUNTER — Ambulatory Visit (INDEPENDENT_AMBULATORY_CARE_PROVIDER_SITE_OTHER): Payer: Medicare Other | Admitting: Family Medicine

## 2019-01-07 ENCOUNTER — Other Ambulatory Visit: Payer: Self-pay

## 2019-01-07 ENCOUNTER — Encounter: Payer: Self-pay | Admitting: Family Medicine

## 2019-01-07 VITALS — BP 120/60 | HR 74 | Temp 98.0°F | Resp 16 | Ht 67.0 in | Wt 186.0 lb

## 2019-01-07 DIAGNOSIS — Z8673 Personal history of transient ischemic attack (TIA), and cerebral infarction without residual deficits: Secondary | ICD-10-CM

## 2019-01-07 DIAGNOSIS — I10 Essential (primary) hypertension: Secondary | ICD-10-CM | POA: Diagnosis not present

## 2019-01-07 MED ORDER — LOSARTAN POTASSIUM 25 MG PO TABS: 25.0000 mg | ORAL_TABLET | Freq: Every day | ORAL | 3 refills | Status: DC

## 2019-01-07 MED ORDER — ASPIRIN EC 325 MG PO TBEC: 325.0000 mg | DELAYED_RELEASE_TABLET | Freq: Every day | ORAL | 0 refills | Status: AC

## 2019-01-07 NOTE — Progress Notes (Signed)
Subjective:    Patient ID: Charles York, male    DOB: Nov 01, 1930, 85 y.o.   MRN: [id number]  Charles York is a 85 y.o. male presenting on 01/07/2019 for Hospitalization Follow-up (TIA)   HPI   ED FOLLOW-UP VISIT  Hospital/Location: [place] Date of ED Visit: 12/29/18  Reason for Presenting to ED: L sided weakness Primary (+Secondary) Diagnosis: TIA  FOLLOW-UP  - ED provider note and record have been reviewed - Patient presents today about 9 days after recent ED visit. Brief summary of recent course, patient had symptoms of L sided facial and arm weakness in evening suddenly, presented to ED, testing in ED with rule out CVA with imaging Head CT MRI and CTA of neck (could not do doppler) had other labs and EKG etc as well, ultimately CT had questionable spot and then MRI and other imaging ruled out CVA, diagnosed with TIA, treated with increased ASA from 81 up to 325mg .  Charles York is a 85 y.o. male with a past medical history of anxiety, arthritis, gastric reflux, hypertension, hyperlipidemia presents to the emergency department for left-sided weakness.  According to the patient he woke around 4:00 this morning with a sensation of weakness and numbness to his left face and left arm.  Patient states around his lips on the left side as well as his left face he cannot feel much of anything, was having trouble using the left arm and felt like the left arm was numb.  Patient continues to state some numbness sensation to the left face and left arm although states it has improved significantly from earlier this morning.  Denies any headache at any point.  Denies any right-sided symptoms.  Denies any leg symptoms.  No history of stroke previously per patient.  Largely negative review of systems otherwise.  - Today reports overall has done well after discharge from ED. Symptoms of weakness L sided and numbness have resolved  [facility] did recommend outpatient ECHO on future follow-up  - New  medications on discharge: ASA 325, needs new rx to continue  Denies any weakness, numbness tingling, chest pain dyspnea, headache, vision changes  I have reviewed the discharge medication list, and have reconciled the current and discharge medications today.   Current Outpatient Medications:  .  aspirin EC 325 MG tablet, Take 1 tablet (325 mg total) by mouth daily. For up to 90 days, then can reduce dose to 81mg  daily, Disp: 90 tablet, Rfl: 0 .  atorvastatin (LIPITOR) 80 MG tablet, Take 40 mg by mouth at bedtime. , Disp: , Rfl:  .  busPIRone (BUSPAR) 5 MG tablet, Take 1 tablet (5 mg total) by mouth 2 (two) times daily as needed., Disp: 60 tablet, Rfl: 2 .  ferrous sulfate 325 (65 FE) MG tablet, Take 325 mg by mouth every other day., Disp: , Rfl:  .  hydroxypropyl methylcellulose / hypromellose (ISOPTO TEARS / GONIOVISC) 2.5 % ophthalmic solution, Place 1 drop into both eyes 4 (four) times daily., Disp: , Rfl:  .  insulin aspart (NOVOLOG) 100 UNIT/ML injection, Inject 12 Units into the skin 2 (two) times daily. , Disp: , Rfl:  .  insulin glargine (LANTUS) 100 UNIT/ML injection, Inject 27 Units into the skin at bedtime. , Disp: , Rfl:  .  ipratropium (ATROVENT) 0.06 % nasal spray, USE 2 SPRAYS IN EACH NOSTRIL FOUR TIMES DAILY FOR UP TO 5 TO 7 DAYS THEN STOP (Patient taking differently: Place 2 sprays into both nostrils 2 (  two) times daily. ), Disp: 15 mL, Rfl: 2 .  losartan (COZAAR) 25 MG tablet, Take 1 tablet (25 mg total) by mouth daily., Disp: 90 tablet, Rfl: 3 .  omeprazole (PRILOSEC) 20 MG capsule, TAKE 1 CAPSULE(20 MG) BY MOUTH DAILY BEFORE BREAKFAST (Patient taking differently: Take 20 mg by mouth daily. ), Disp: 90 capsule, Rfl: 1 .  vitamin B-12 (CYANOCOBALAMIN) 1000 MCG tablet, Take 2,000 mcg by mouth daily. , Disp: , Rfl:   ------------------------------------------------------------------------- Social History   Tobacco Use  . Smoking status: Former Smoker    Types: Cigarettes     Quit date: 02/04/1983    Years since quitting: 35.9  . Smokeless tobacco: Former Network engineer Use Topics  . Alcohol use: No    Alcohol/week: 0.0 standard drinks  . Drug use: No    Review of Systems Per HPI unless specifically indicated above     Objective:    BP 120/60   Pulse 74   Temp 98 F (36.7 C) (Oral)   Resp 16   Ht 5\' 7"  (1.702 m)   Wt 186 lb (84.4 kg)   BMI 29.13 kg/m   Wt Readings from Last 3 Encounters:  01/07/19 186 lb (84.4 kg)  12/29/18 188 lb (85.3 kg)  11/23/18 184 lb 4.8 oz (83.6 kg)    Physical Exam Vitals signs and nursing note reviewed.  Constitutional:      General: He is not in acute distress.    Appearance: He is well-developed. He is not diaphoretic.     Comments: Well-appearing, comfortable, cooperative  HENT:     Head: Normocephalic and atraumatic.  Eyes:     General:        Right eye: No discharge.        Left eye: No discharge.     Conjunctiva/sclera: Conjunctivae normal.  Neck:     Musculoskeletal: Normal range of motion and neck supple.     Thyroid: No thyromegaly.  Cardiovascular:     Rate and Rhythm: Normal rate and regular rhythm.     Heart sounds: Normal heart sounds. No murmur.  Pulmonary:     Effort: Pulmonary effort is normal. No respiratory distress.     Breath sounds: Normal breath sounds. No wheezing or rales.  Musculoskeletal: Normal range of motion.  Lymphadenopathy:     Cervical: No cervical adenopathy.  Skin:    General: Skin is warm and dry.     Findings: No erythema or rash.  Neurological:     General: No focal deficit present.     Mental Status: He is alert and oriented to person, place, and time.     Cranial Nerves: No cranial nerve deficit.     Sensory: No sensory deficit.     Motor: No weakness.     Coordination: Coordination normal.  Psychiatric:        Behavior: Behavior normal.     Comments: Well groomed, good eye contact, normal speech and thoughts      CT Head Wo ContrastPerformed 12/29/2018  Final result  Study Result CLINICAL DATA: Left-sided weakness and numbness, now resolved  EXAM: CT HEAD WITHOUT CONTRAST  TECHNIQUE: Contiguous axial images were obtained from the base of the skull through the vertex without intravenous contrast.  COMPARISON: 2006  FINDINGS: Brain: There is no acute intracranial hemorrhage, mass-effect, or edema. Gray-white differentiation is preserved. Small area of low attenuation the anterior right pons is favored to be artifactual. There is no extra-axial fluid collection. Ventricles and sulci  are within normal limits in size and configuration. Patchy hypoattenuation in the supratentorial white matter is nonspecific but may reflect mild chronic microvascular ischemic changes.  Vascular: No hyperdense vessel. Mild intracranial atherosclerotic calcification at the skull base.  Skull: Calvarium is unremarkable.  Sinuses/Orbits: Right posterior ethmoid opacification. No acute orbital finding.  Other: None.  IMPRESSION: No acute intracranial hemorrhage, mass effect, or definite evidence of acute infarction. Small area of low attenuation in the anterior right pons is favored to be artifactual. If there is concern for acute ischemia, MRI could be obtained.  Mild chronic microvascular ischemic changes.   Electronically Signed By: Macy Mis M.D. On: 12/29/2018 10:18   ------  MR BRAIN WO CONTRASTPerformed 12/29/2018 Final result  Study Result CLINICAL DATA: Left-sided weakness and numbness.  EXAM: MRI HEAD WITHOUT CONTRAST  TECHNIQUE: Multiplanar, multiecho pulse sequences of the brain and surrounding structures were obtained without intravenous contrast.  COMPARISON: Head CT 12/29/2018  FINDINGS: Brain: There is no evidence of acute infarct, intracranial hemorrhage, mass, midline shift, or extra-axial fluid collection. The ventricles and sulci are within normal limits for age. T2 hyperintensities in the cerebral  white matter bilaterally are nonspecific but compatible with mild chronic small vessel ischemic disease. No abnormality is evident in the pons, and the small focus of low attenuation on CT was artifactual.  Vascular: Major intracranial vascular flow voids are preserved.  Skull and upper cervical spine: Unremarkable bone marrow signal.  Sinuses/Orbits: Bilateral cataract extraction. Right posterior ethmoid air cell opacification. Moderate right mastoid effusion.  Other: None.  IMPRESSION: 1. No acute intracranial abnormality. 2. Mild chronic small vessel ischemic disease.   Electronically Signed By: Logan Bores M.D. On: 12/29/2018 12:08   ------- CT Angio Neck W and/or Wo ContrastPerformed 12/29/2018 Final result  Study Result CLINICAL DATA: Left-sided weakness. Right hand numbness, left mouth numbness, and left shoulder pain with symptoms now resolved.  EXAM: CT ANGIOGRAPHY NECK  TECHNIQUE: Multidetector CT imaging of the neck was performed using the standard protocol during bolus administration of intravenous contrast. Multiplanar CT image reconstructions and MIPs were obtained to evaluate the vascular anatomy. Carotid stenosis measurements (when applicable) are obtained utilizing NASCET criteria, using the distal internal carotid diameter as the denominator.  CONTRAST: 23mL OMNIPAQUE IOHEXOL 350 MG/ML SOLN  COMPARISON: None.  FINDINGS: Aortic arch: Normal variant aortic arch branching pattern with common origin of the brachiocephalic and left common carotid arteries. Mild calcified plaque in the aortic arch without significant arch vessel origin stenosis.  Right carotid system: Patent without evidence of stenosis or dissection.  Left carotid system: Patent with mixed calcified and soft plaque at the carotid bifurcation resulting in 25% ICA origin stenosis.  Vertebral arteries: 60% stenosis of the right subclavian artery at its origin. Patent vertebral  arteries without evidence of significant stenosis or dissection. Strongly dominant left vertebral artery.  Skeleton: Advanced disc space narrowing at C5-6 greater than C6-7.  Other neck: No evidence of cervical lymphadenopathy or mass.  Upper chest: Clear lung apices.  IMPRESSION: 1. 60% proximal right subclavian artery stenosis. 2. Patent cervical carotid and vertebral arteries without significant stenosis. 3. Aortic Atherosclerosis (ICD10-I70.0).   Electronically Signed By: Logan Bores M.D. On: 12/29/2018 15:44   Results for orders placed or performed during the hospital encounter of 12/29/18  Glucose, capillary  Result Value Ref Range   Glucose-Capillary 181 (H) 70 - 99 mg/dL   Comment 1 Notify RN    Comment 2 Document in Chart   CBC  Result Value Ref  Range   WBC 8.0 4.0 - 10.5 K/uL   RBC 4.15 (L) 4.22 - 5.81 MIL/uL   Hemoglobin 13.3 13.0 - 17.0 g/dL   HCT 38.2 (L) 39.0 - 52.0 %   MCV 92.0 80.0 - 100.0 fL   MCH 32.0 26.0 - 34.0 pg   MCHC 34.8 30.0 - 36.0 g/dL   RDW 12.6 11.5 - 15.5 %   Platelets 194 150 - 400 K/uL   nRBC 0.0 0.0 - 0.2 %  CMP  Result Value Ref Range   Sodium 140 135 - 145 mmol/L   Potassium 4.1 3.5 - 5.1 mmol/L   Chloride 103 98 - 111 mmol/L   CO2 27 22 - 32 mmol/L   Glucose, Bld 195 (H) 70 - 99 mg/dL   BUN 22 8 - 23 mg/dL   Creatinine, Ser 1.29 (H) 0.61 - 1.24 mg/dL   Calcium 9.3 8.9 - 10.3 mg/dL   Total Protein 7.4 6.5 - 8.1 g/dL   Albumin 4.1 3.5 - 5.0 g/dL   AST 18 15 - 41 U/L   ALT 17 0 - 44 U/L   Alkaline Phosphatase 82 38 - 126 U/L   Total Bilirubin 1.1 0.3 - 1.2 mg/dL   GFR calc non Af Amer 50 (L) >60 mL/min   GFR calc Af Amer 58 (L) >60 mL/min   Anion gap 10 5 - 15      Assessment & Plan:   Problem List Items Addressed This Visit    Essential hypertension   Relevant Medications   aspirin EC 325 MG tablet   losartan (COZAAR) 25 MG tablet    Other Visit Diagnoses    History of TIA (transient ischemic attack)    -  Primary    Relevant Medications   aspirin EC 325 MG tablet   Other Relevant Orders   ECHOCARDIOGRAM COMPLETE      #HTN Controlled on current regimen - will re order Losartan 25mg  daily today  #TIA Resolved Assoc with L sided weakness at the onset, since resolve. Had extensive work up imaging done in ED. Continues on ARB, high intensity statin Atorvastatin 40mg  (half of 80mg ) Was on ASA 81mg  - now continue ASA 325 daily for at least 3 months, reconsider in future, seems to be tolerating well may continue longer if indicated.  Ordered ECHOcardiogram, f/u results   Meds ordered this encounter  Medications  . aspirin EC 325 MG tablet    Sig: Take 1 tablet (325 mg total) by mouth daily. For up to 90 days, then can reduce dose to 81mg  daily    Dispense:  90 tablet    Refill:  0  . losartan (COZAAR) 25 MG tablet    Sig: Take 1 tablet (25 mg total) by mouth daily.    Dispense:  90 tablet    Refill:  3    Follow up plan: Return in about 2 months (around 03/21/2019) for as scheduled already.    Nobie Putnam, DO [facility] 01/07/2019, 9:58 AM

## 2019-01-07 NOTE — Patient Instructions (Addendum)
Thank you for coming to the office today.  Ordered ECHOcardiogram ultrasound of the heart, stay tuned for scheduling.  Refilled Aspirin 325mg  once daily - if no issues with bleeding or bruising can take this for 3 months. Then reduce down to 81mg  once daily.  Refilled Losartan.  Notify us 2 weeks to 1 month before run out of meds and let me know what exactly you need 90 day / pharmacy / which meds   Please schedule a Follow-up Appointment to: Return in about 2 months (around 03/21/2019) for as scheduled already.  If you have any other questions or concerns, please feel free to call the office or send a message through [place]. You may also schedule an earlier appointment if necessary.  Additionally, you may be receiving a survey about your experience at our office within a few days to 1 week by e-mail or mail. We value your feedback.  Nobie Putnam, DO [place]

## 2019-01-20 ENCOUNTER — Other Ambulatory Visit: Payer: Self-pay

## 2019-01-20 ENCOUNTER — Ambulatory Visit
Admission: RE | Admit: 2019-01-20 | Discharge: 2019-01-20 | Disposition: A | Discharge status: Home / Self Care | Payer: Medicare Other | Source: Ambulatory Visit | Attending: Family Medicine | Admitting: Family Medicine

## 2019-01-20 DIAGNOSIS — E119 Type 2 diabetes mellitus without complications: Secondary | ICD-10-CM | POA: Diagnosis not present

## 2019-01-20 DIAGNOSIS — I083 Combined rheumatic disorders of mitral, aortic and tricuspid valves: Secondary | ICD-10-CM | POA: Insufficient documentation

## 2019-01-20 DIAGNOSIS — I088 Other rheumatic multiple valve diseases: Secondary | ICD-10-CM | POA: Diagnosis not present

## 2019-01-20 DIAGNOSIS — I10 Essential (primary) hypertension: Secondary | ICD-10-CM | POA: Insufficient documentation

## 2019-01-20 DIAGNOSIS — Z8673 Personal history of transient ischemic attack (TIA), and cerebral infarction without residual deficits: Secondary | ICD-10-CM

## 2019-01-20 DIAGNOSIS — I77810 Thoracic aortic ectasia: Secondary | ICD-10-CM | POA: Insufficient documentation

## 2019-01-20 DIAGNOSIS — G459 Transient cerebral ischemic attack, unspecified: Secondary | ICD-10-CM | POA: Diagnosis present

## 2019-01-20 DIAGNOSIS — I493 Ventricular premature depolarization: Secondary | ICD-10-CM | POA: Insufficient documentation

## 2019-01-20 NOTE — Progress Notes (Signed)
*  PRELIMINARY RESULTS* Echocardiogram 2D Echocardiogram has been performed.  Sherrie Sport 01/20/2019, 10:20 AM

## 2019-02-08 ENCOUNTER — Encounter: Payer: Self-pay | Admitting: Family Medicine

## 2019-02-08 ENCOUNTER — Ambulatory Visit (INDEPENDENT_AMBULATORY_CARE_PROVIDER_SITE_OTHER): Payer: Medicare Other | Admitting: Family Medicine

## 2019-02-08 ENCOUNTER — Other Ambulatory Visit: Payer: Self-pay

## 2019-02-08 VITALS — BP 140/52 | HR 78 | Temp 97.7°F | Resp 16

## 2019-02-08 DIAGNOSIS — J0100 Acute maxillary sinusitis, unspecified: Secondary | ICD-10-CM | POA: Diagnosis not present

## 2019-02-08 DIAGNOSIS — J029 Acute pharyngitis, unspecified: Secondary | ICD-10-CM | POA: Diagnosis not present

## 2019-02-08 MED ORDER — AZITHROMYCIN 250 MG PO TABS: ORAL_TABLET | ORAL | 0 refills | Status: DC

## 2019-02-08 NOTE — Patient Instructions (Addendum)
Start Azithromycin Z pak (antibiotic) 2 tabs day 1, then 1 tab x 4 days, complete entire course even if improved  Try Fish Oil to help lubricate throat as well.  Please schedule a Follow-up Appointment to: Return in about 1 week (around 02/15/2019), or if symptoms worsen or fail to improve, for sinus / sore throat.  If you have any other questions or concerns, please feel free to call the office or send a message through [place]. You may also schedule an earlier appointment if necessary.  Additionally, you may be receiving a survey about your experience at our office within a few days to 1 week by e-mail or mail. We value your feedback.  Nobie Putnam, DO [place]

## 2019-02-08 NOTE — Progress Notes (Signed)
Virtual Visit via Telephone The purpose of this virtual visit is to provide medical care while limiting exposure to the novel coronavirus (COVID19) for both patient and office staff.  Consent was obtained for phone visit:  Yes.   Answered questions that patient had about telehealth interaction:  Yes.   I discussed the limitations, risks, security and privacy concerns of performing an evaluation and management service by telephone. I also discussed with the patient that there may be a patient responsible charge related to this service. The patient expressed understanding and agreed to proceed.  Patient Location: Home Provider Location: [facility])  ---------------------------------------------------------------------- Chief Complaint  Patient presents with  . Sore Throat    constant sore throat, but more discomfort in the morning. x 3days      S: Reviewed CMA documentation. I have called patient and gathered additional HPI as follows:  Sinusitis / SORE THROAT / R Ear Pain Reports that symptoms started about 3 days ago, seems sore throat is worse in AM then it improves after gets up and later in day feels better. Admits dry mouth when get up. He is able to eat drink and swallow without difficulty. He has Atrovent nasal using PRN for nasal congestion for post nasal drip. Constant sore throat but worse in AM. Has sinus drainage as well and R ear pain. - Tried OTC throat lozenges - Previously 08/2018 he was treated for similar sinus symptoms with post nasal drainage and sinus pressure pain, resolved w/ z-pak at that time, requesting similar.  Denies any high risk travel to areas of current concern for COVID19. Denies any known or suspected exposure to person with or possibly with COVID19.  Denies any fevers, chills, sweats, body ache, cough, shortness of breath, headache, abdominal pain, diarrhea  Past Medical History:  Diagnosis Date  . Anxiety   . Arthritis   .  Cancer ([place])    skin  . Chronic kidney disease    stage 3  . Colon polyp   . Diabetes mellitus without complication ([place])   . GERD (gastroesophageal reflux disease)   . Heartburn   . Hyperlipemia   . Hypertension   . Obesity    Social History   Tobacco Use  . Smoking status: Former Smoker    Types: Cigarettes    Quit date: 02/04/1983    Years since quitting: 36.0  . Smokeless tobacco: Former Network engineer Use Topics  . Alcohol use: No    Alcohol/week: 0.0 standard drinks  . Drug use: No    Current Outpatient Medications:  .  aspirin EC 325 MG tablet, Take 1 tablet (325 mg total) by mouth daily. For up to 90 days, then can reduce dose to 81mg  daily, Disp: 90 tablet, Rfl: 0 .  atorvastatin (LIPITOR) 80 MG tablet, Take 40 mg by mouth at bedtime. , Disp: , Rfl:  .  busPIRone (BUSPAR) 5 MG tablet, Take 1 tablet (5 mg total) by mouth 2 (two) times daily as needed., Disp: 60 tablet, Rfl: 2 .  ferrous sulfate 325 (65 FE) MG tablet, Take 325 mg by mouth every other day., Disp: , Rfl:  .  hydroxypropyl methylcellulose / hypromellose (ISOPTO TEARS / GONIOVISC) 2.5 % ophthalmic solution, Place 1 drop into both eyes 4 (four) times daily., Disp: , Rfl:  .  insulin aspart (NOVOLOG) 100 UNIT/ML injection, Inject 12 Units into the skin 2 (two) times daily. , Disp: , Rfl:  .  insulin glargine (LANTUS) 100 UNIT/ML injection,  Inject 27 Units into the skin at bedtime. , Disp: , Rfl:  .  losartan (COZAAR) 25 MG tablet, Take 1 tablet (25 mg total) by mouth daily., Disp: 90 tablet, Rfl: 3 .  omeprazole (PRILOSEC) 20 MG capsule, TAKE 1 CAPSULE(20 MG) BY MOUTH DAILY BEFORE BREAKFAST (Patient taking differently: Take 20 mg by mouth daily. ), Disp: 90 capsule, Rfl: 1 .  vitamin B-12 (CYANOCOBALAMIN) 1000 MCG tablet, Take 2,000 mcg by mouth daily. , Disp: , Rfl:  .  azithromycin (ZITHROMAX Z-PAK) 250 MG tablet, Take 2 tabs (500mg  total) on Day 1. Take 1 tab (250mg ) daily for next 4 days., Disp: 6 tablet,  Rfl: 0 .  FLUAD QUADRIVALENT 0.5 ML injection, , Disp: , Rfl:  .  ipratropium (ATROVENT) 0.06 % nasal spray, USE 2 SPRAYS IN EACH NOSTRIL FOUR TIMES DAILY FOR UP TO 5 TO 7 DAYS THEN STOP (Patient not taking: No sig reported), Disp: 15 mL, Rfl: 2  Depression screen [facility] 2/9 01/07/2019 11/23/2018 08/17/2018  Decreased Interest 0 0 0  Down, Depressed, Hopeless 0 0 0  PHQ - 2 Score 0 0 0  Altered sleeping 0 3 -  Tired, decreased energy 0 3 -  Change in appetite 0 2 -  Feeling bad or failure about yourself  0 0 -  Trouble concentrating 0 0 -  Moving slowly or fidgety/restless 0 2 -  Suicidal thoughts 0 0 -  PHQ-9 Score 0 10 -  Difficult doing work/chores - - -  Some recent data might be hidden    GAD 7 : Generalized Anxiety Score 04/01/2018  Nervous, Anxious, on Edge 1  Control/stop worrying 2  Worry too much - different things 1  Trouble relaxing 1  Restless 1  Easily annoyed or irritable 1  Afraid - awful might happen 0  Total GAD 7 Score 7  Anxiety Difficulty Somewhat difficult    -------------------------------------------------------------------------- O: No physical exam performed due to remote telephone encounter.  Lab results reviewed.  Recent Results (from the past 2160 hour(s))  Glucose, capillary     Status: Abnormal   Collection Time: 12/29/18  9:22 AM  Result Value Ref Range   Glucose-Capillary 181 (H) 70 - 99 mg/dL   Comment 1 Notify RN    Comment 2 Document in Chart   CBC     Status: Abnormal   Collection Time: 12/29/18 10:02 AM  Result Value Ref Range   WBC 8.0 4.0 - 10.5 K/uL   RBC 4.15 (L) 4.22 - 5.81 MIL/uL   Hemoglobin 13.3 13.0 - 17.0 g/dL   HCT 38.2 (L) 39.0 - 52.0 %   MCV 92.0 80.0 - 100.0 fL   MCH 32.0 26.0 - 34.0 pg   MCHC 34.8 30.0 - 36.0 g/dL   RDW 12.6 11.5 - 15.5 %   Platelets 194 150 - 400 K/uL   nRBC 0.0 0.0 - 0.2 %    Comment: Performed at [facility], [address] [place], [place] [address]  CMP     Status: Abnormal    Collection Time: 12/29/18 10:02 AM  Result Value Ref Range   Sodium 140 135 - 145 mmol/L   Potassium 4.1 3.5 - 5.1 mmol/L   Chloride 103 98 - 111 mmol/L   CO2 27 22 - 32 mmol/L   Glucose, Bld 195 (H) 70 - 99 mg/dL   BUN 22 8 - 23 mg/dL   Creatinine, Ser 1.29 (H) 0.61 - 1.24 mg/dL   Calcium 9.3 8.9 -  10.3 mg/dL   Total Protein 7.4 6.5 - 8.1 g/dL   Albumin 4.1 3.5 - 5.0 g/dL   AST 18 15 - 41 U/L   ALT 17 0 - 44 U/L   Alkaline Phosphatase 82 38 - 126 U/L   Total Bilirubin 1.1 0.3 - 1.2 mg/dL   GFR calc non Af Amer 50 (L) >60 mL/min   GFR calc Af Amer 58 (L) >60 mL/min   Anion gap 10 5 - 15    Comment: Performed at [facility], [address], [place], [place] [address]    -------------------------------------------------------------------------- A&P:  Problem List Items Addressed This Visit    None    Visit Diagnoses    Acute non-recurrent maxillary sinusitis    -  Primary   Relevant Medications   azithromycin (ZITHROMAX Z-PAK) 250 MG tablet   Sore throat         Consistent with acute maxillary sinusitis Similar to prior sinus infection, resolved w/ z-pak 08/2018, with R ear pain and pressure and R sided sore throat, also component of dry mouth as well affecting throat  Plan: 1. Start Azithromycin Z pak (antibiotic) 2 tabs day 1, then 1 tab x 4 days, complete entire course even if improved 2. Continue Atrovent PRN congestion, caution overuse 3. Try Fish oil to lubricate throat Return criteria reviewed   Meds ordered this encounter  Medications  . azithromycin (ZITHROMAX Z-PAK) 250 MG tablet    Sig: Take 2 tabs (500mg  total) on Day 1. Take 1 tab (250mg ) daily for next 4 days.    Dispense:  6 tablet    Refill:  0    Follow-up: - Return in 1-2 week as needed PRN sinus  Patient verbalizes understanding with the above medical recommendations including the limitation of remote medical advice.  Specific follow-up and call-back criteria were given for patient  to follow-up or seek medical care more urgently if needed.   - Time spent in direct consultation with patient on phone: 8 minutes   Nobie Putnam, [facility] 02/08/2019, 8:40 AM

## 2019-02-18 ENCOUNTER — Other Ambulatory Visit: Payer: Medicare Other

## 2019-02-26 ENCOUNTER — Other Ambulatory Visit: Payer: Self-pay | Admitting: Family Medicine

## 2019-02-26 DIAGNOSIS — F4322 Adjustment disorder with anxiety: Secondary | ICD-10-CM

## 2019-03-21 ENCOUNTER — Encounter: Payer: Self-pay | Admitting: Family Medicine

## 2019-03-21 ENCOUNTER — Ambulatory Visit (INDEPENDENT_AMBULATORY_CARE_PROVIDER_SITE_OTHER): Payer: Medicare Other | Admitting: Family Medicine

## 2019-03-21 ENCOUNTER — Other Ambulatory Visit: Payer: Self-pay

## 2019-03-21 ENCOUNTER — Other Ambulatory Visit: Payer: Self-pay | Admitting: Family Medicine

## 2019-03-21 VITALS — BP 114/49 | HR 78 | Temp 98.5°F | Ht 67.0 in | Wt 187.8 lb

## 2019-03-21 DIAGNOSIS — E1169 Type 2 diabetes mellitus with other specified complication: Secondary | ICD-10-CM

## 2019-03-21 DIAGNOSIS — I10 Essential (primary) hypertension: Secondary | ICD-10-CM

## 2019-03-21 DIAGNOSIS — K219 Gastro-esophageal reflux disease without esophagitis: Secondary | ICD-10-CM | POA: Diagnosis not present

## 2019-03-21 DIAGNOSIS — D508 Other iron deficiency anemias: Secondary | ICD-10-CM | POA: Diagnosis not present

## 2019-03-21 DIAGNOSIS — R351 Nocturia: Secondary | ICD-10-CM

## 2019-03-21 DIAGNOSIS — N1831 Chronic kidney disease, stage 3a: Secondary | ICD-10-CM | POA: Diagnosis not present

## 2019-03-21 DIAGNOSIS — E1140 Type 2 diabetes mellitus with diabetic neuropathy, unspecified: Secondary | ICD-10-CM

## 2019-03-21 DIAGNOSIS — Z0000 Encounter for general adult medical examination without abnormal findings: Secondary | ICD-10-CM

## 2019-03-21 DIAGNOSIS — E785 Hyperlipidemia, unspecified: Secondary | ICD-10-CM

## 2019-03-21 LAB — POCT GLYCOSYLATED HEMOGLOBIN (HGB A1C): Hemoglobin A1C: 7.6 % — AB (ref 4.0–5.6)

## 2019-03-21 MED ORDER — ATORVASTATIN CALCIUM 40 MG PO TABS: 40.0000 mg | ORAL_TABLET | Freq: Every day | ORAL | 3 refills | Status: AC

## 2019-03-21 MED ORDER — OMEPRAZOLE 40 MG PO CPDR: 40.0000 mg | DELAYED_RELEASE_CAPSULE | Freq: Every day | ORAL | 3 refills | Status: DC

## 2019-03-21 MED ORDER — INSULIN GLARGINE 100 UNIT/ML ~~LOC~~ SOLN: 27.0000 [IU] | Freq: Every day | SUBCUTANEOUS | 3 refills | Status: DC

## 2019-03-21 MED ORDER — INSULIN ASPART 100 UNIT/ML ~~LOC~~ SOLN: 12.0000 [IU] | Freq: Two times a day (BID) | SUBCUTANEOUS | 3 refills | Status: DC

## 2019-03-21 NOTE — Assessment & Plan Note (Addendum)
Stable chronic history of GERD Refill Omeprazole 20mg  daily

## 2019-03-21 NOTE — Progress Notes (Signed)
Subjective:    Patient ID: Charles York, male    DOB: 1930-04-16, 86 y.o.   MRN: [id number]  [name] is a 86 y.o. male presenting on 03/21/2019 for Diabetes   HPI   CHRONIC DM, Type 2, complicated by neuropathy, nephropathy, and retinopathy. Prior A1c 7.4, now due today CBGs: avg 150, Low88(mostly asymptomatic improved with PO), High< 200. Checks CBGs2x daily AM and PM. No written log today Meds:Lantus 27 units in AM Novolog with mealtime12units BID (morning dose AM sometimes skip til noon/lunch) Reports good compliance. Tolerating well w/o side-effects Currently on ARB  Lifestyle: - Diet (continues to improve DM diet, and improved water intake reduce sodas) - Exercise (walking) -Followed by [facility] DM Eye exams - CKD: Recent Cr trend improved and stable at baseline Cr 1.3 - Admits some tingling in feet stable without change - Denies worsening burning, skin ulcer, polyuria, hyperglycemia  Anemia Last lab 12/2018, showed mild low Hgb 13.3 improved, normal MCV vs mild elevated. He was started on trial of oral iron tablet. Now he says significantly improved feels more energy and doing well, tolerating 1 iron pill now every OTHER day due to constipation.  Health Maintenance: 1st COVID19 vaccine pfizer 1.28.21  Depression screen [facility] 2/9 03/21/2019 01/07/2019 11/23/2018  Decreased Interest 0 0 0  Down, Depressed, Hopeless 0 0 0  PHQ - 2 Score 0 0 0  Altered sleeping - 0 3  Tired, decreased energy - 0 3  Change in appetite - 0 2  Feeling bad or failure about yourself  - 0 0  Trouble concentrating - 0 0  Moving slowly or fidgety/restless - 0 2  Suicidal thoughts - 0 0  PHQ-9 Score - 0 10  Difficult doing work/chores - - -  Some recent data might be hidden    Social History   Tobacco Use  . Smoking status: Former Smoker    Types: Cigarettes    Quit date: 02/04/1983    Years since quitting: 36.1  . Smokeless tobacco: Former Network engineer Use Topics  .  Alcohol use: No    Alcohol/week: 0.0 standard drinks  . Drug use: No    Review of Systems Per HPI unless specifically indicated above     Objective:    BP (!) 114/49 (BP Location: Left Arm, Patient Position: Sitting, Cuff Size: Normal)   Pulse 78   Temp 98.5 F (36.9 C) (Oral)   Ht 5\' 7"  (1.702 m)   Wt 187 lb 12.8 oz (85.2 kg)   BMI 29.41 kg/m   Wt Readings from Last 3 Encounters:  03/21/19 187 lb 12.8 oz (85.2 kg)  01/07/19 186 lb (84.4 kg)  12/29/18 188 lb (85.3 kg)    Physical Exam Vitals and nursing note reviewed.  Constitutional:      General: He is not in acute distress.    Appearance: He is well-developed. He is not diaphoretic.     Comments: Well-appearing, comfortable, cooperative  HENT:     Head: Normocephalic and atraumatic.  Eyes:     General:        Right eye: No discharge.        Left eye: No discharge.     Conjunctiva/sclera: Conjunctivae normal.  Neck:     Thyroid: No thyromegaly.  Cardiovascular:     Rate and Rhythm: Normal rate and regular rhythm.     Heart sounds: Normal heart sounds. No murmur.  Pulmonary:     Effort: Pulmonary effort is  normal. No respiratory distress.     Breath sounds: Normal breath sounds. No wheezing or rales.  Musculoskeletal:        General: Normal range of motion.     Cervical back: Normal range of motion and neck supple.  Lymphadenopathy:     Cervical: No cervical adenopathy.  Skin:    General: Skin is warm and dry.     Findings: No erythema or rash.  Neurological:     Mental Status: He is alert and oriented to person, place, and time.  Psychiatric:        Behavior: Behavior normal.     Comments: Well groomed, good eye contact, normal speech and thoughts      Diabetic Foot Exam - Simple   Simple Foot Form Diabetic Foot exam was performed with the following findings: Yes 03/21/2019  8:27 AM  Visual Inspection See comments: Yes Sensation Testing See comments: Yes Pulse Check Posterior Tibialis and Dorsalis  pulse intact bilaterally: Yes Comments Bilateral feet with some dry skin, without ulceration, some toe deformity, using spacer between great toe and second toes. Reduced sensation to monofilament heels and forefoot but preserved in mid foot and great toe.      Results for orders placed or performed in visit on 03/21/19  POCT glycosylated hemoglobin (Hb A1C)  Result Value Ref Range   Hemoglobin A1C 7.6 (A) 4.0 - 5.6 %      Assessment & Plan:   Problem List Items Addressed This Visit    Type 2 diabetes, controlled, with neuropathy ([place]) - Primary    Stable DM A1c 7.6 now at goal Complications - CKD-III, peripheral neuropathy, DM retinopathy No further hypoglycemia OFF Trulicity due to side effect  Plan:  1. Continue Lantus 27u daily, Novolog 12 BID wc Encourage improved lifestyle - low carb, low sugar diet, reduce portion size, continue improving regular exercise Check CBG, bring log to next visit for review Continue ASA, ARB, Statin Awaiting request from eye exam DM Foot exam      Relevant Medications   insulin aspart (NOVOLOG) 100 UNIT/ML injection   insulin glargine (LANTUS) 100 UNIT/ML injection   atorvastatin (LIPITOR) 40 MG tablet   Other Relevant Orders   POCT glycosylated hemoglobin (Hb A1C) (Completed)   Hyperlipidemia associated with type 2 diabetes mellitus (HCC)    On Statin Calculated ASCVD 10 yr risk score elevated due to DM, age  Plan: 1. Continue current meds - Atorvastatin 40mg  new rx sent, not half of 80mg  2. Continue ASA 81mg  for primary ASCVD risk reduction 3. Encourage improved lifestyle - low carb/cholesterol, reduce portion size, continue improving regular exercise      Relevant Medications   insulin aspart (NOVOLOG) 100 UNIT/ML injection   insulin glargine (LANTUS) 100 UNIT/ML injection   atorvastatin (LIPITOR) 40 MG tablet   GERD (gastroesophageal reflux disease)    Stable chronic history of GERD Refill Omeprazole 20mg  daily      Relevant  Medications   omeprazole (PRILOSEC) 40 MG capsule   Essential hypertension    Well-controlled HTN - Home BP readings controlled  Complication with CKD-III   Plan:  1. Continue current BP regimen - ARB Losartan 25mg  daily 2. Encourage improved lifestyle - low sodium diet, regular exercise 3. Continue monitor BP outside office, bring readings to next visit, if persistently >140/90 or new symptoms notify office sooner. Or if low BP < 100/70 and symptomatic 4. Follow-up 6 months for annual and labs      Relevant Medications  atorvastatin (LIPITOR) 40 MG tablet   Other Relevant Orders   BASIC METABOLIC PANEL WITH GFR   CKD (chronic kidney disease), stage III    Stable Last creatinine 1.3 avg near baseline Secondary complication to DM and HTN Improved water intake Monitor q 6-12 months  Check BMET today      Relevant Orders   BASIC METABOLIC PANEL WITH GFR   Absolute anemia    Improved clinically on oral iron supplement On vitamin C tab for absorption, may take BID if need  Check CBC today      Relevant Orders   CBC with Differential/Platelet       Meds ordered this encounter  Medications  . insulin aspart (NOVOLOG) 100 UNIT/ML injection    Sig: Inject 12 Units into the skin 2 (two) times daily with a meal.    Dispense:  10 mL    Refill:  3  . insulin glargine (LANTUS) 100 UNIT/ML injection    Sig: Inject 0.27 mLs (27 Units total) into the skin daily before breakfast.    Dispense:  10 mL    Refill:  3  . atorvastatin (LIPITOR) 40 MG tablet    Sig: Take 1 tablet (40 mg total) by mouth at bedtime.    Dispense:  90 tablet    Refill:  3  . omeprazole (PRILOSEC) 40 MG capsule    Sig: Take 1 capsule (40 mg total) by mouth daily before breakfast.    Dispense:  90 capsule    Refill:  3    Dose increase      Follow up plan: Return in about 6 months (around 09/18/2019) for Annual Physical.  Future labs ordered 09/05/19  Nobie Putnam, [facility] 03/21/2019, 8:28 AM

## 2019-03-21 NOTE — Assessment & Plan Note (Signed)
Stable Last creatinine 1.3 avg near baseline Secondary complication to DM and HTN Improved water intake Monitor q 6-12 months  Check BMET today

## 2019-03-21 NOTE — Assessment & Plan Note (Addendum)
Stable DM A1c 7.6 now at goal Complications - CKD-III, peripheral neuropathy, DM retinopathy No further hypoglycemia OFF Trulicity due to side effect  Plan:  1. Continue Lantus 27u daily, Novolog 12 BID wc Encourage improved lifestyle - low carb, low sugar diet, reduce portion size, continue improving regular exercise Check CBG, bring log to next visit for review Continue ASA, ARB, Statin Awaiting request from eye exam DM Foot exam

## 2019-03-21 NOTE — Patient Instructions (Addendum)
Thank you for coming to the office today.  Labs today  Refilledc meds  Recent Labs    04/01/18 0931 07/28/18 0806 03/21/19 0822  HGBA1C 9.1* 7.4* 7.6*    DUE for FASTING BLOOD WORK (no food or drink after midnight before the lab appointment, only water or coffee without cream/sugar on the morning of)  SCHEDULE "Lab Only" visit in the morning at the clinic for lab draw in 6 MONTHS   - Make sure Lab Only appointment is at about 1 week before your next appointment, so that results will be available  For Lab Results, once available within 2-3 days of blood draw, you can can log in to MyChart online to view your results and a brief explanation. Also, we can discuss results at next follow-up visit.   Please schedule a Follow-up Appointment to: Return in about 6 months (around 09/18/2019) for Annual Physical.  If you have any other questions or concerns, please feel free to call the office or send a message through [place]. You may also schedule an earlier appointment if necessary.  Additionally, you may be receiving a survey about your experience at our office within a few days to 1 week by e-mail or mail. We value your feedback.  Nobie Putnam, DO [place]

## 2019-03-21 NOTE — Assessment & Plan Note (Signed)
Well-controlled HTN - Home BP readings controlled  Complication with CKD-III   Plan:  1. Continue current BP regimen - ARB Losartan 25mg  daily 2. Encourage improved lifestyle - low sodium diet, regular exercise 3. Continue monitor BP outside office, bring readings to next visit, if persistently >140/90 or new symptoms notify office sooner. Or if low BP < 100/70 and symptomatic 4. Follow-up 6 months for annual and labs

## 2019-03-21 NOTE — Assessment & Plan Note (Signed)
On Statin Calculated ASCVD 10 yr risk score elevated due to DM, age  Plan: 1. Continue current meds - Atorvastatin 40mg  new rx sent, not half of 80mg  2. Continue ASA 81mg  for primary ASCVD risk reduction 3. Encourage improved lifestyle - low carb/cholesterol, reduce portion size, continue improving regular exercise

## 2019-03-21 NOTE — Assessment & Plan Note (Signed)
Improved clinically on oral iron supplement On vitamin C tab for absorption, may take BID if need  Check CBC today

## 2019-03-22 LAB — BASIC METABOLIC PANEL WITH GFR
BUN/Creatinine Ratio: 16 (calc) (ref 6–22)
BUN: 22 mg/dL (ref 7–25)
CO2: 25 mmol/L (ref 20–32)
Calcium: 9.3 mg/dL (ref 8.6–10.3)
Chloride: 107 mmol/L (ref 98–110)
Creat: 1.4 mg/dL — ABNORMAL HIGH (ref 0.70–1.11)
GFR, Est African American: 52 mL/min/{1.73_m2} — ABNORMAL LOW (ref >=60)
GFR, Est Non African American: 45 mL/min/{1.73_m2} — ABNORMAL LOW (ref >=60)
Glucose, Bld: 219 mg/dL — ABNORMAL HIGH (ref 65–99)
Potassium: 4.3 mmol/L (ref 3.5–5.3)
Sodium: 140 mmol/L (ref 135–146)

## 2019-03-22 LAB — CBC WITH DIFFERENTIAL/PLATELET
Absolute Monocytes: 499 cells/uL (ref 200–950)
Basophils Absolute: 62 cells/uL (ref 0–200)
Basophils Relative: 0.8 %
Eosinophils Absolute: 101 cells/uL (ref 15–500)
Eosinophils Relative: 1.3 %
HCT: 38.2 % — ABNORMAL LOW (ref 38.5–50.0)
Hemoglobin: 12.8 g/dL — ABNORMAL LOW (ref 13.2–17.1)
Lymphs Abs: 2465 cells/uL (ref 850–3900)
MCH: 32.5 pg (ref 27.0–33.0)
MCHC: 33.5 g/dL (ref 32.0–36.0)
MCV: 97 fL (ref 80.0–100.0)
MPV: 11.3 fL (ref 7.5–12.5)
Monocytes Relative: 6.4 %
Neutro Abs: 4672 cells/uL (ref 1500–7800)
Neutrophils Relative %: 59.9 %
Platelets: 227 10*3/uL (ref 140–400)
RBC: 3.94 10*6/uL — ABNORMAL LOW (ref 4.20–5.80)
RDW: 12.2 % (ref 11.0–15.0)
Total Lymphocyte: 31.6 %
WBC: 7.8 10*3/uL (ref 3.8–10.8)

## 2019-03-23 ENCOUNTER — Other Ambulatory Visit: Payer: Self-pay | Admitting: Family Medicine

## 2019-03-23 DIAGNOSIS — E1140 Type 2 diabetes mellitus with diabetic neuropathy, unspecified: Secondary | ICD-10-CM

## 2019-03-23 MED ORDER — HUMALOG KWIKPEN 100 UNIT/ML ~~LOC~~ SOPN: 12.0000 [IU] | PEN_INJECTOR | Freq: Two times a day (BID) | SUBCUTANEOUS | 3 refills | Status: DC

## 2019-03-25 ENCOUNTER — Telehealth: Payer: Self-pay | Admitting: Family Medicine

## 2019-03-25 NOTE — Chronic Care Management (AMB) (Signed)
  Chronic Care Management   Note  03/25/2019 Name: Charles York MRN: [id number] DOB: 08-02-1930  Charles York is a 86 y.o. year old male who is a primary care patient of [name], DO. I reached out to Yvetta Coder by phone today in response to a referral sent by Mr. Rosser [facility] health plan.     Mr. Killgore was given information about Chronic Care Management services today including:  1. CCM service includes personalized support from designated clinical staff supervised by his physician, including individualized plan of care and coordination with other care providers 2. 24/7 contact phone numbers for assistance for urgent and routine care needs. 3. Service will only be billed when office clinical staff spend 20 minutes or more in a month to coordinate care. 4. Only one practitioner may furnish and bill the service in a calendar month. 5. The patient may stop CCM services at any time (effective at the end of the month) by phone call to the office staff. 6. The patient will be responsible for cost sharing (co-pay) of up to 20% of the service fee (after annual deductible is met).  Patient agreed to services and verbal consent obtained.   Follow up plan: Telephone appointment with care management team member scheduled for:05/02/2019  Noreene Larsson, [address], [place], [place] [address] Direct Dial: [phone] [email or web address] Website: [email or web address]

## 2019-04-08 ENCOUNTER — Other Ambulatory Visit: Payer: Self-pay

## 2019-04-08 ENCOUNTER — Encounter: Payer: Self-pay | Admitting: Family Medicine

## 2019-04-08 ENCOUNTER — Ambulatory Visit (INDEPENDENT_AMBULATORY_CARE_PROVIDER_SITE_OTHER): Payer: Medicare Other | Admitting: Family Medicine

## 2019-04-08 VITALS — BP 123/52 | HR 70 | Temp 97.7°F | Resp 16 | Ht 67.0 in | Wt 188.0 lb

## 2019-04-08 DIAGNOSIS — R110 Nausea: Secondary | ICD-10-CM | POA: Diagnosis not present

## 2019-04-08 DIAGNOSIS — S2341XA Sprain of ribs, initial encounter: Secondary | ICD-10-CM | POA: Diagnosis not present

## 2019-04-08 DIAGNOSIS — K5900 Constipation, unspecified: Secondary | ICD-10-CM

## 2019-04-08 MED ORDER — POLYETHYLENE GLYCOL 3350 17 GM/SCOOP PO POWD: 17.0000 g | Freq: Two times a day (BID) | ORAL | 1 refills | Status: DC | PRN

## 2019-04-08 NOTE — Progress Notes (Signed)
Subjective:    Patient ID: Charles York, male    DOB: 03/21/1930, 86 y.o.   MRN: [id number]  Charles York is a 86 y.o. male presenting on 04/08/2019 for Constipation (pain on left lateral side, nausea in the morning onset 10 days)   HPI   Constipation / Nausea Chronic problem with episodic constipation. Previously treated for constipation has had prior KUB imaging showed stool burden. He has had constipation episode for >4 days now has not had last BM. He admits inc gas production and belching. Now some worse L sided flank / abdominal pain - Tried Senna and Dulcolax without significant relief - Taking Omeprazole 40mg  daily, helps control acid reflux Admits reduced appetite lately due to these symptoms - Denies any blood in stool or dark stools  Left Rib / Back Pain Recently 2 weeks he was preparing garden and using a hoe, and stretching he says he thinks may have strained Rib on Left side. It is sore to touch. And in his back. He tried topical voltaren once and it helped. Denies any urinary symptoms dysuria, hematuria frequency  UTD COVID19 vaccine  Depression screen [facility] 2/9 03/21/2019 01/07/2019 11/23/2018  Decreased Interest 0 0 0  Down, Depressed, Hopeless 0 0 0  PHQ - 2 Score 0 0 0  Altered sleeping - 0 3  Tired, decreased energy - 0 3  Change in appetite - 0 2  Feeling bad or failure about yourself  - 0 0  Trouble concentrating - 0 0  Moving slowly or fidgety/restless - 0 2  Suicidal thoughts - 0 0  PHQ-9 Score - 0 10  Difficult doing work/chores - - -  Some recent data might be hidden    Social History   Tobacco Use  . Smoking status: Former Smoker    Types: Cigarettes    Quit date: 02/04/1983    Years since quitting: 36.1  . Smokeless tobacco: Former Network engineer Use Topics  . Alcohol use: No    Alcohol/week: 0.0 standard drinks  . Drug use: No    Review of Systems Per HPI unless specifically indicated above     Objective:    BP (!) 123/52   Pulse 70    Temp 97.7 F (36.5 C) (Temporal)   Resp 16   Ht 5\' 7"  (1.702 m)   Wt 188 lb (85.3 kg)   BMI 29.44 kg/m   Wt Readings from Last 3 Encounters:  04/08/19 188 lb (85.3 kg)  03/21/19 187 lb 12.8 oz (85.2 kg)  01/07/19 186 lb (84.4 kg)    Physical Exam Vitals and nursing note reviewed.  Constitutional:      General: He is not in acute distress.    Appearance: He is well-developed. He is not diaphoretic.     Comments: Well-appearing, comfortable, cooperative  HENT:     Head: Normocephalic and atraumatic.  Eyes:     General:        Right eye: No discharge.        Left eye: No discharge.     Conjunctiva/sclera: Conjunctivae normal.  Neck:     Thyroid: No thyromegaly.  Cardiovascular:     Rate and Rhythm: Normal rate and regular rhythm.     Heart sounds: Normal heart sounds. No murmur.  Pulmonary:     Effort: Pulmonary effort is normal. No respiratory distress.     Breath sounds: Normal breath sounds. No wheezing or rales.  Abdominal:     General: There  is no distension.     Palpations: Abdomen is soft. There is no mass.     Tenderness: There is no abdominal tenderness (Non tender abdomen).     Hernia: No hernia is present.     Comments: Reduced bowel sounds  Musculoskeletal:        General: Tenderness (left lower rib including posterior aspect of rib on Left side) present. Normal range of motion.     Cervical back: Normal range of motion and neck supple.  Lymphadenopathy:     Cervical: No cervical adenopathy.  Skin:    General: Skin is warm and dry.     Findings: No erythema or rash.  Neurological:     Mental Status: He is alert and oriented to person, place, and time.  Psychiatric:        Behavior: Behavior normal.     Comments: Well groomed, good eye contact, normal speech and thoughts    Results for orders placed or performed in visit on [id number]  BASIC METABOLIC PANEL WITH GFR  Result Value Ref Range   Glucose, Bld 219 (H) 65 - 99 mg/dL   BUN 22 7 - 25 mg/dL    Creat 1.40 (H) 0.70 - 1.11 mg/dL   GFR, Est Non African American 45 (L) > OR = 60 mL/min/1.3m2   GFR, Est African American 52 (L) > OR = 60 mL/min/1.5m2   BUN/Creatinine Ratio 16 6 - 22 (calc)   Sodium 140 135 - 146 mmol/L   Potassium 4.3 3.5 - 5.3 mmol/L   Chloride 107 98 - 110 mmol/L   CO2 25 20 - 32 mmol/L   Calcium 9.3 8.6 - 10.3 mg/dL  CBC with Differential/Platelet  Result Value Ref Range   WBC 7.8 3.8 - 10.8 Thousand/uL   RBC 3.94 (L) 4.20 - 5.80 Million/uL   Hemoglobin 12.8 (L) 13.2 - 17.1 g/dL   HCT 38.2 (L) 38.5 - 50.0 %   MCV 97.0 80.0 - 100.0 fL   MCH 32.5 27.0 - 33.0 pg   MCHC 33.5 32.0 - 36.0 g/dL   RDW 12.2 11.0 - 15.0 %   Platelets 227 140 - 400 Thousand/uL   MPV 11.3 7.5 - 12.5 fL   Neutro Abs 4,672 1,500 - 7,800 cells/uL   Lymphs Abs 2,465 850 - 3,900 cells/uL   Absolute Monocytes 499 200 - 950 cells/uL   Eosinophils Absolute 101 15 - 500 cells/uL   Basophils Absolute 62 0 - 200 cells/uL   Neutrophils Relative % 59.9 %   Total Lymphocyte 31.6 %   Monocytes Relative 6.4 %   Eosinophils Relative 1.3 %   Basophils Relative 0.8 %  POCT glycosylated hemoglobin (Hb A1C)  Result Value Ref Range   Hemoglobin A1C 7.6 (A) 4.0 - 5.6 %      Assessment & Plan:   Problem List Items Addressed This Visit    Constipation - Primary   Relevant Medications   polyethylene glycol powder (GLYCOLAX/MIRALAX) 17 GM/SCOOP powder    Other Visit Diagnoses    Rib sprain, initial encounter       Nausea          Clinically with recurrent chronic constipation, seems functional issue No acute abdominal concerns, no abdominal pain. On senna but not getting relief Should restart Miralax, was successful in past. Instructions given with 17-34g 1-2 capful then titrate up as advised until desired effect. Can hold Senna for now Improve hydration, fiber Defer KUB imaging, can repeat if indicated  #Rib Pain  Clinically exam supportive of MSK rib / back pain, muscle tender to  palpation reproducible. No other features, likely triggering injury with repetitive movements with gardening prior to onset Reassurance May use topical Voltaren OTC more frequently he is underdosing now, should use 2-3 times a day for now, add Tylenol PRN Follow-up if need  Meds ordered this encounter  Medications  . polyethylene glycol powder (GLYCOLAX/MIRALAX) 17 GM/SCOOP powder    Sig: Take 17-34 g by mouth 2 (two) times daily as needed for mild constipation or moderate constipation.    Dispense:  255 g    Refill:  1     Follow up plan: Return in about 2 weeks (around 04/22/2019), or if symptoms worsen or fail to improve, for constipation, rib strain.   Nobie Putnam, [facility] 04/08/2019, 9:37 AM

## 2019-04-08 NOTE — Patient Instructions (Addendum)
Thank you for coming to the office today.  For Constipation (less frequent bowel movement that can be hard dry or involve straining).  Recommend trying OTC Miralax 17g = 1 capful in large glass water once daily for now, try several days to see if working, goal is soft stool or BM 1-2 times daily, if too loose then reduce dose or try every other day. If not effective may need to increase it to 2 doses at once in AM or may do 1 in morning and 1 in afternoon/evening  - This medicine is very safe and can be used often without any problem and will not make you dehydrated. It is good for use on AS NEEDED BASIS or even MAINTENANCE therapy for longer term for several days to weeks at a time to help regulate bowel movements  Other more natural remedies or preventative treatment: - Increase hydration with water - Increase fiber in diet (high fiber foods = vegetables, leafy greens, oats/grains) - May take OTC Fiber supplement (metamucil powder or pill/gummy) - May try OTC Probiotic  ------------  Can try Gas-X OTC as needed for bloating.  Use Voltaren gel up to 3 times a day on ribs / side and heating pad as needed   Please schedule a Follow-up Appointment to: Return in about 2 weeks (around 04/22/2019), or if symptoms worsen or fail to improve, for constipation, rib strain.  If you have any other questions or concerns, please feel free to call the office or send a message through [place]. You may also schedule an earlier appointment if necessary.  Additionally, you may be receiving a survey about your experience at our office within a few days to 1 week by e-mail or mail. We value your feedback.  Nobie Putnam, DO [place]

## 2019-05-02 ENCOUNTER — Telehealth: Payer: Medicare Other | Admitting: General Practice

## 2019-05-02 ENCOUNTER — Ambulatory Visit (INDEPENDENT_AMBULATORY_CARE_PROVIDER_SITE_OTHER): Payer: Medicare Other | Admitting: General Practice

## 2019-05-02 DIAGNOSIS — E1169 Type 2 diabetes mellitus with other specified complication: Secondary | ICD-10-CM | POA: Diagnosis not present

## 2019-05-02 DIAGNOSIS — I10 Essential (primary) hypertension: Secondary | ICD-10-CM

## 2019-05-02 DIAGNOSIS — E785 Hyperlipidemia, unspecified: Secondary | ICD-10-CM

## 2019-05-02 DIAGNOSIS — N1831 Chronic kidney disease, stage 3a: Secondary | ICD-10-CM

## 2019-05-02 DIAGNOSIS — E1165 Type 2 diabetes mellitus with hyperglycemia: Secondary | ICD-10-CM | POA: Diagnosis not present

## 2019-05-02 DIAGNOSIS — F419 Anxiety disorder, unspecified: Secondary | ICD-10-CM

## 2019-05-02 DIAGNOSIS — Z794 Long term (current) use of insulin: Secondary | ICD-10-CM

## 2019-05-02 NOTE — Chronic Care Management (AMB) (Signed)
Chronic Care Management   Initial Visit Note  05/02/2019 Name: Charles York MRN: [id number] DOB: 1930-12-30  Referred by: Charles Hauser, DO Reason for referral : Chronic Care Management (RNCM Chronic Disease Management and care coordination needs)   Charles York is a 86 y.o. year old male who is a primary care patient of Charles Hauser, DO. The CCM team was consulted for assistance with chronic disease management and care coordination needs related to HTN, HLD, DMII, CKD Stage 3 and Anxiety  Review of patient status, including review of consultants reports, relevant laboratory and other test results, and collaboration with appropriate care team members and the patient's provider was performed as part of comprehensive patient evaluation and provision of chronic care management services.    SDOH (Social Determinants of Health) assessments performed: Yes See Care Plan activities for detailed interventions related to SDOH  SDOH Interventions     Most Recent Value  SDOH Interventions  SDOH Interventions for the Following Domains  Physical Activity  Physical Activity Interventions  Other (Comments) [The patient is independent but does not have a structured exercise plan]       Medications: Outpatient Encounter Medications as of 05/02/2019  Medication Sig Note  . atorvastatin (LIPITOR) 40 MG tablet Take 1 tablet (40 mg total) by mouth at bedtime.   . busPIRone (BUSPAR) 5 MG tablet TAKE 1 TABLET(5 MG) BY MOUTH TWICE DAILY AS NEEDED   . ferrous sulfate 325 (65 FE) MG tablet Take 325 mg by mouth every other day.   Marland Kitchen HUMALOG KWIKPEN 100 UNIT/ML KwikPen Inject 0.12 mLs (12 Units total) into the skin 2 (two) times daily with a meal. Up to 50 units/day as directed by MD   . hydroxypropyl methylcellulose / hypromellose (ISOPTO TEARS / GONIOVISC) 2.5 % ophthalmic solution Place 1 drop into both eyes 4 (four) times daily.   . insulin glargine (LANTUS) 100 UNIT/ML injection Inject  0.27 mLs (27 Units total) into the skin daily before breakfast.   . ipratropium (ATROVENT) 0.06 % nasal spray USE 2 SPRAYS IN EACH NOSTRIL FOUR TIMES DAILY FOR UP TO 5 TO 7 DAYS THEN STOP 12/29/2018: Dropped to twice daily due to excessive dryness.  Marland Kitchen losartan (COZAAR) 25 MG tablet Take 1 tablet (25 mg total) by mouth daily.   Marland Kitchen omeprazole (PRILOSEC) 40 MG capsule Take 1 capsule (40 mg total) by mouth daily before breakfast.   . polyethylene glycol powder (GLYCOLAX/MIRALAX) 17 GM/SCOOP powder Take 17-34 g by mouth 2 (two) times daily as needed for mild constipation or moderate constipation.   . vitamin B-12 (CYANOCOBALAMIN) 1000 MCG tablet Take 2,000 mcg by mouth daily.     No facility-administered encounter medications on file as of 05/02/2019.     Objective:  BP Readings from Last 3 Encounters:  05/02/19 (!) 150/60  04/08/19 (!) 123/52  03/21/19 (!) 114/49    Goals Addressed            This Visit's Progress   . RNCM: pt-"I get a little down sometime thats why I take the Buspar" (pt-stated)       CARE PLAN ENTRY (see longtitudinal plan of care for additional care plan information)  Current Barriers:  . Chronic Disease Management support, education, and care coordination needs related to HTN, HLD, DMII, CKD Stage 3, and Anxiety  Clinical Goal(s) related to HTN, HLD, DMII, CKD Stage 3, and Anxiety:  Over the next 120 days, patient will:  . Work with the care management team  to address educational, disease management, and care coordination needs  . Begin or continue self health monitoring activities as directed today Measure and record cbg (blood glucose) 2 times daily, Measure and record blood pressure 4 times per week, and adhere to a heart healthy/ADA diet . Call provider office for new or worsened signs and symptoms Blood glucose findings outside established parameters, Blood pressure findings outside established parameters, Chest pain, and New or worsened symptom related to  anxiety, ckd3 or other chronic conditions . Call care management team with questions or concerns . Verbalize basic understanding of patient centered plan of care established today  Interventions related to HTN, HLD, DMII, CKD Stage 3, and Anxiety:  . Evaluation of current treatment plans and patient's adherence to plan as established by provider. The patient feels he does well at managing his care. His daughter lives with him but he is independent and cares for himself.  . Assessed patient understanding of disease states. Patient understands his chronic conditions. Ask questions.  . Assessed patient's education and care coordination needs.  The patient wanted to know what to do when he needed refills on his medications. The patient expressed he uses Buspar to help with his anxiety at times. He has lost his wife and son and sometimes he grieves over them.  . Empathetic listening and support as the patient expressed loss of his wife and son and how that impacts him at times. He is effectively managing. Enjoys talking to his sister who is 40 in [place].  . Provided disease specific education to patient. Education given on following a heart healthy/ADA diet and the benefits of watching dietary habits for health and well being.  Nash Dimmer with appropriate clinical care team members regarding patient needs. Review of CCM team pharmacist and LCSW. No needs at this time identified.   Patient Self Care Activities related to HTN, HLD, DMII, CKD Stage 3, and Anxiety:  . Patient is unable to independently self-manage chronic health conditions  Initial goal documentation         [name] was given information about Chronic Care Management services today including:  1. CCM service includes personalized support from designated clinical staff supervised by his physician, including individualized plan of care and coordination with other care providers 2. 24/7 contact phone numbers for assistance for  urgent and routine care needs. 3. Service will only be billed when office clinical staff spend 20 minutes or more in a month to coordinate care. 4. Only one practitioner may furnish and bill the service in a calendar month. 5. The patient may stop CCM services at any time (effective at the end of the month) by phone call to the office staff. 6. The patient will be responsible for cost sharing (co-pay) of up to 20% of the service fee (after annual deductible is met).  Patient agreed to services and verbal consent obtained.   Plan:   The care management team will reach out to the patient again over the next 60 days.   Noreene Larsson RN, MSN, [place] Mobile: [phone]

## 2019-05-02 NOTE — Patient Instructions (Signed)
Visit Information  Goals Addressed            This Visit's Progress   . RNCM: pt-"I get a little down sometime thats why I take the Buspar" (pt-stated)       CARE PLAN ENTRY (see longtitudinal plan of care for additional care plan information)  Current Barriers:  . Chronic Disease Management support, education, and care coordination needs related to HTN, HLD, DMII, CKD Stage 3, and Anxiety  Clinical Goal(s) related to HTN, HLD, DMII, CKD Stage 3, and Anxiety:  Over the next 120 days, patient will:  . Work with the care management team to address educational, disease management, and care coordination needs  . Begin or continue self health monitoring activities as directed today Measure and record cbg (blood glucose) 2 times daily, Measure and record blood pressure 4 times per week, and adhere to a heart healthy/ADA diet . Call provider office for new or worsened signs and symptoms Blood glucose findings outside established parameters, Blood pressure findings outside established parameters, Chest pain, and New or worsened symptom related to anxiety, ckd3 or other chronic conditions . Call care management team with questions or concerns . Verbalize basic understanding of patient centered plan of care established today  Interventions related to HTN, HLD, DMII, CKD Stage 3, and Anxiety:  . Evaluation of current treatment plans and patient's adherence to plan as established by provider. The patient feels he does well at managing his care. His daughter lives with him but he is independent and cares for himself.  . Assessed patient understanding of disease states. Patient understands his chronic conditions. Ask questions.  . Assessed patient's education and care coordination needs.  The patient wanted to know what to do when he needed refills on his medications. The patient expressed he uses Buspar to help with his anxiety at times. He has lost his wife and son and sometimes he grieves over them.   . Empathetic listening and support as the patient expressed loss of his wife and son and how that impacts him at times. He is effectively managing. Enjoys talking to his sister who is 32 in [place].  . Provided disease specific education to patient. Education given on following a heart healthy/ADA diet and the benefits of watching dietary habits for health and well being.  Nash Dimmer with appropriate clinical care team members regarding patient needs. Review of CCM team pharmacist and LCSW. No needs at this time identified.   Patient Self Care Activities related to HTN, HLD, DMII, CKD Stage 3, and Anxiety:  . Patient is unable to independently self-manage chronic health conditions  Initial goal documentation        Mr. Galdamez was given information about Chronic Care Management services today including:  1. CCM service includes personalized support from designated clinical staff supervised by his physician, including individualized plan of care and coordination with other care providers 2. 24/7 contact phone numbers for assistance for urgent and routine care needs. 3. Service will only be billed when office clinical staff spend 20 minutes or more in a month to coordinate care. 4. Only one practitioner may furnish and bill the service in a calendar month. 5. The patient may stop CCM services at any time (effective at the end of the month) by phone call to the office staff. 6. The patient will be responsible for cost sharing (co-pay) of up to 20% of the service fee (after annual deductible is met).  Patient agreed to services and verbal consent  obtained.   Patient verbalizes understanding of instructions provided today.   The care management team will reach out to the patient again over the next 60 days.   Noreene Larsson RN, MSN, [place] Mobile: [phone]

## 2019-05-18 ENCOUNTER — Other Ambulatory Visit: Payer: Self-pay | Admitting: Family Medicine

## 2019-05-18 DIAGNOSIS — K219 Gastro-esophageal reflux disease without esophagitis: Secondary | ICD-10-CM

## 2019-06-01 ENCOUNTER — Other Ambulatory Visit: Payer: Self-pay | Admitting: Family Medicine

## 2019-06-01 DIAGNOSIS — F4322 Adjustment disorder with anxiety: Secondary | ICD-10-CM

## 2019-06-01 NOTE — Telephone Encounter (Signed)
Requested Prescriptions  Pending Prescriptions Disp Refills  . busPIRone (BUSPAR) 5 MG tablet [Pharmacy Med Name: BUSPIRONE 5MG  TABLETS] 180 tablet 1    Sig: TAKE 1 TABLET(5 MG) BY MOUTH TWICE DAILY AS NEEDED     Psychiatry: Anxiolytics/Hypnotics - Non-controlled Passed - 06/01/2019  3:20 AM      Passed - Valid encounter within last 6 months    Recent Outpatient Visits          1 month ago Constipation, unspecified constipation type   [place], DO   2 months ago Type 2 diabetes, controlled, with neuropathy [facility])   [facility] [name], DO   3 months ago Acute non-recurrent maxillary sinusitis   Tat Momoli, DO   4 months ago History of TIA (transient ischemic attack)   [place], DO   6 months ago Epigastric discomfort   [place], Devonne Doughty, [place]

## 2019-06-24 ENCOUNTER — Emergency Department
Admission: EM | Admit: 2019-06-24 | Discharge: 2019-06-24 | Disposition: A | Discharge status: Home / Self Care | Payer: Medicare Other | Attending: Emergency Medicine | Admitting: Emergency Medicine

## 2019-06-24 ENCOUNTER — Emergency Department: Payer: Medicare Other

## 2019-06-24 ENCOUNTER — Other Ambulatory Visit: Payer: Self-pay

## 2019-06-24 ENCOUNTER — Ambulatory Visit: Payer: Self-pay | Admitting: *Deleted

## 2019-06-24 ENCOUNTER — Encounter: Payer: Self-pay | Admitting: Medical Oncology

## 2019-06-24 DIAGNOSIS — K2100 Gastro-esophageal reflux disease with esophagitis, without bleeding: Secondary | ICD-10-CM | POA: Diagnosis not present

## 2019-06-24 DIAGNOSIS — E1122 Type 2 diabetes mellitus with diabetic chronic kidney disease: Secondary | ICD-10-CM | POA: Diagnosis not present

## 2019-06-24 DIAGNOSIS — Z794 Long term (current) use of insulin: Secondary | ICD-10-CM | POA: Insufficient documentation

## 2019-06-24 DIAGNOSIS — I129 Hypertensive chronic kidney disease with stage 1 through stage 4 chronic kidney disease, or unspecified chronic kidney disease: Secondary | ICD-10-CM | POA: Insufficient documentation

## 2019-06-24 DIAGNOSIS — Z87891 Personal history of nicotine dependence: Secondary | ICD-10-CM | POA: Diagnosis not present

## 2019-06-24 DIAGNOSIS — R079 Chest pain, unspecified: Secondary | ICD-10-CM

## 2019-06-24 DIAGNOSIS — R0789 Other chest pain: Secondary | ICD-10-CM | POA: Insufficient documentation

## 2019-06-24 DIAGNOSIS — N183 Chronic kidney disease, stage 3 unspecified: Secondary | ICD-10-CM | POA: Diagnosis not present

## 2019-06-24 DIAGNOSIS — Z79899 Other long term (current) drug therapy: Secondary | ICD-10-CM | POA: Diagnosis not present

## 2019-06-24 DIAGNOSIS — K219 Gastro-esophageal reflux disease without esophagitis: Secondary | ICD-10-CM | POA: Insufficient documentation

## 2019-06-24 LAB — CBC
HCT: 35.1 % — ABNORMAL LOW (ref 39.0–52.0)
Hemoglobin: 12.2 g/dL — ABNORMAL LOW (ref 13.0–17.0)
MCH: 32.9 pg (ref 26.0–34.0)
MCHC: 34.8 g/dL (ref 30.0–36.0)
MCV: 94.6 fL (ref 80.0–100.0)
Platelets: 212 10*3/uL (ref 150–400)
RBC: 3.71 MIL/uL — ABNORMAL LOW (ref 4.22–5.81)
RDW: 12.7 % (ref 11.5–15.5)
WBC: 10.3 10*3/uL (ref 4.0–10.5)
nRBC: 0 % (ref 0.0–0.2)

## 2019-06-24 LAB — TROPONIN I (HIGH SENSITIVITY)
Troponin I (High Sensitivity): 6 ng/L (ref <18)
Troponin I (High Sensitivity): 7 ng/L (ref <18)

## 2019-06-24 LAB — BASIC METABOLIC PANEL
Anion gap: 9 (ref 5–15)
BUN: 42 mg/dL — ABNORMAL HIGH (ref 8–23)
CO2: 22 mmol/L (ref 22–32)
Calcium: 9.2 mg/dL (ref 8.9–10.3)
Chloride: 107 mmol/L (ref 98–111)
Creatinine, Ser: 1.51 mg/dL — ABNORMAL HIGH (ref 0.61–1.24)
GFR calc Af Amer: 47 mL/min — ABNORMAL LOW (ref >60)
GFR calc non Af Amer: 41 mL/min — ABNORMAL LOW (ref >60)
Glucose, Bld: 174 mg/dL — ABNORMAL HIGH (ref 70–99)
Potassium: 5.2 mmol/L — ABNORMAL HIGH (ref 3.5–5.1)
Sodium: 138 mmol/L (ref 135–145)

## 2019-06-24 MED ORDER — SUCRALFATE 1 G PO TABS
1.0000 g | ORAL_TABLET | Freq: Four times a day (QID) | ORAL | 0 refills | Status: DC
Start: 2019-06-24 — End: 2021-01-09

## 2019-06-24 MED ORDER — LIDOCAINE VISCOUS HCL 2 % MT SOLN
15.0000 mL | Freq: Once | OROMUCOSAL | Status: AC
  Administered 2019-06-24: 15 mL via OROMUCOSAL
  Filled 2019-06-24: qty 15

## 2019-06-24 NOTE — ED Provider Notes (Signed)
[facility] Emergency Department Provider Note   ____________________________________________   I have reviewed the triage vital signs and the nursing notes.   HISTORY  Chief Complaint Chest Pain   History limited by: Not Limited   HPI Charles York is a 86 y.o. male who presents to the emergency department today because of concerns for chest pain.  He states is located in his left chest.  He describes it more of a discomfort.  He states it has been present for the past month.  It will come and go.  It will go away for couple days and then come back for a couple of days.  He has not noticed any pattern to it.  Nothing he does brings the pain on or necessarily makes it better.  He has had some increased indigestion.  He denies any associated shortness of breath.  Denies any fevers.  Denies any trauma.   Records reviewed. Per medical record review patient has a history of DM  Past Medical History:  Diagnosis Date  . Anxiety   . Arthritis   . Cancer ([place])    skin  . Chronic kidney disease    stage 3  . Colon polyp   . Diabetes mellitus without complication ([place])   . GERD (gastroesophageal reflux disease)   . Heartburn   . Hyperlipemia   . Hypertension   . Obesity     Patient Active Problem List   Diagnosis Date Noted  . Chronic right shoulder pain 08/17/2018  . Absolute anemia 07/30/2018  . Primary osteoarthritis involving multiple joints 06/15/2018  . Constipation 02/24/2017  . GERD (gastroesophageal reflux disease) 11/18/2016  . Diabetic retinopathy ([place]) 11/18/2016  . Hyperlipidemia associated with type 2 diabetes mellitus ([place]) 11/18/2016  . Daytime sleepiness 07/15/2016  . Fatigue 07/15/2016  . Presbycusis of both ears 07/15/2016  . Tinnitus of right ear 07/15/2016  . Allergic rhinitis due to allergen 06/09/2016  . CKD (chronic kidney disease), stage III 02/27/2016  . BPPV (benign paroxysmal positional vertigo), left 02/27/2016  .  Chronic pansinusitis 02/27/2016  . Arthritis 07/03/2015  . Colon polyp 07/03/2015  . Essential hypertension 07/03/2015  . High cholesterol 07/03/2015  . Type 2 diabetes, controlled, with neuropathy ([place]) 07/03/2015    Past Surgical History:  Procedure Laterality Date  . CHOLECYSTECTOMY N/A 02/04/2018   Procedure: LAPAROSCOPIC CHOLECYSTECTOMY;  Surgeon: Johnathan Hausen, MD;  Location: [facility] ORS;  Service: General;  Laterality: N/A;  . EYE SURGERY  11/2017   fluid in back of eye/ injection every 3 months. done at [place]  . EYE SURGERY Bilateral    cataracts extracted    Prior to Admission medications   Medication Sig Start Date End Date Taking? Authorizing Provider  atorvastatin (LIPITOR) 40 MG tablet Take 1 tablet (40 mg total) by mouth at bedtime. 03/21/19   Karamalegos, Devonne Doughty, DO  busPIRone (BUSPAR) 5 MG tablet TAKE 1 TABLET(5 MG) BY MOUTH TWICE DAILY AS NEEDED 06/01/19   Parks Ranger, Devonne Doughty, DO  ferrous sulfate 325 (65 FE) MG tablet Take 325 mg by mouth every other day.    [provider]  HUMALOG KWIKPEN 100 UNIT/ML KwikPen Inject 0.12 mLs (12 Units total) into the skin 2 (two) times daily with a meal. Up to 50 units/day as directed by MD 03/23/19   [name], DO  hydroxypropyl methylcellulose / hypromellose (ISOPTO TEARS / GONIOVISC) 2.5 % ophthalmic solution Place 1 drop into both eyes 4 (four) times daily.  [provider]  insulin glargine (LANTUS) 100 UNIT/ML injection Inject 0.27 mLs (27 Units total) into the skin daily before breakfast. 03/21/19   Karamalegos, Alexander J, DO  ipratropium (ATROVENT) 0.06 % nasal spray USE 2 SPRAYS IN EACH NOSTRIL FOUR TIMES DAILY FOR UP TO 5 TO 7 DAYS THEN STOP 09/07/18   Karamalegos, Devonne Doughty, DO  losartan (COZAAR) 25 MG tablet Take 1 tablet (25 mg total) by mouth daily. 01/07/19   Karamalegos, Devonne Doughty, DO  omeprazole (PRILOSEC) 40 MG capsule Take 1 capsule (40 mg total) by mouth daily before breakfast.  03/21/19   Karamalegos, Devonne Doughty, DO  polyethylene glycol powder (GLYCOLAX/MIRALAX) 17 GM/SCOOP powder Take 17-34 g by mouth 2 (two) times daily as needed for mild constipation or moderate constipation. 04/08/19   Karamalegos, Devonne Doughty, DO  vitamin B-12 (CYANOCOBALAMIN) 1000 MCG tablet Take 2,000 mcg by mouth daily.     [provider]    Allergies Trulicity [dulaglutide] and Penicillins  Family History  Problem Relation Age of Onset  . Cancer Father        carcinoma  . Alzheimer's disease Mother     Social History Social History   Tobacco Use  . Smoking status: Former Smoker    Types: Cigarettes    Quit date: 02/04/1983    Years since quitting: 36.4  . Smokeless tobacco: Former Network engineer Use Topics  . Alcohol use: No    Alcohol/week: 0.0 standard drinks  . Drug use: No    Review of Systems Constitutional: No fever/chills Eyes: No visual changes. ENT: No sore throat. Cardiovascular: Positive for chest pain. Respiratory: Denies shortness of breath. Gastrointestinal: Positive for indigestion. Genitourinary: Negative for dysuria. Musculoskeletal: Negative for back pain. Skin: Negative for rash. Neurological: Negative for headaches, focal weakness or numbness.  ____________________________________________   PHYSICAL EXAM:  VITAL SIGNS: ED Triage Vitals [06/24/19 1459]  Enc Vitals Group     BP (!) 156/70     Pulse Rate 81     Resp 18     Temp 98.1 F (36.7 C)     Temp Source Oral     SpO2 98 %     Weight 187 lb 6.3 oz (85 kg)     Height 5\' 7"  (1.702 m)     Head Circumference      Peak Flow      Pain Score 7   Constitutional: Alert and oriented.  Eyes: Conjunctivae are normal.  ENT      Head: Normocephalic and atraumatic.      Nose: No congestion/rhinnorhea.      Mouth/Throat: Mucous membranes are moist.      Neck: No stridor. Hematological/Lymphatic/Immunilogical: No cervical lymphadenopathy. Cardiovascular: Normal rate, regular  rhythm.  No murmurs, rubs, or gallops.  Respiratory: Normal respiratory effort without tachypnea nor retractions. Breath sounds are clear and equal bilaterally. No wheezes/rales/rhonchi. Gastrointestinal: Soft and non tender. No rebound. No guarding.  Genitourinary: Deferred Musculoskeletal: Normal range of motion in all extremities. No lower extremity edema. Neurologic:  Normal speech and language. No gross focal neurologic deficits are appreciated.  Skin:  Skin is warm, dry and intact. No rash noted. Psychiatric: Mood and affect are normal. Speech and behavior are normal. Patient exhibits appropriate insight and judgment.  ____________________________________________    LABS (pertinent positives/negatives)  CBC wbc 10.3, hgb 12.2, plt 212 BMP na 138, k 5.2, glu 174, cr 1.51 Trop hs 6 > 7 ____________________________________________   EKG  I, Nance Pear, attending physician, personally  viewed and interpreted this EKG  EKG Time: 1456 Rate: 77 Rhythm: normal sinus rhythm Axis: normal Intervals: qtc 411 QRS: narrow ST changes: no st elevation Impression: normal ekg  ____________________________________________    RADIOLOGY  CXR No active cardiopulmonary disease  ____________________________________________   PROCEDURES  Procedures  ____________________________________________   INITIAL IMPRESSION / ASSESSMENT AND PLAN / ED COURSE  Pertinent labs & imaging results that were available during my care of the patient were reviewed by me and considered in my medical decision making (see chart for details).   Presented to the emergency department today because of concerns for chest pain.  Differential would be broad including ACS, pneumonia, pneumothorax, PE, dissection, esophagitis, costochondritis amongst other etiologies.  Troponin negative x2.  I doubt PE or dissection given clinical history intermittent symptoms over the past month.  Patient did feel relief  after viscous lidocaine.  At this point I do think esophagitis/GERD unlikely.  I discussed with patient that we will add sucralfate onto his medication.  He states he is already on omeprazole.  Also give patient information about foods to avoid and GI follow-up.  ____________________________________________   FINAL CLINICAL IMPRESSION(S) / ED DIAGNOSES  Final diagnoses:  Nonspecific chest pain  Gastroesophageal reflux disease with esophagitis, unspecified whether hemorrhage     Note: This dictation was prepared with Dragon dictation. Any transcriptional errors that result from this process are unintentional     Nance Pear, MD 06/24/19 1842

## 2019-06-24 NOTE — Telephone Encounter (Signed)
Pt called in c/o chest pain between his left breast and armpit with some mild pain in his upper back.   No cardiac history.  "I've been burping a lot and feeling full".   No history of cardiac, lung or GI issues.  He also mentioned he has been more tired lately than what is usual for him.  He also mentioned he feels "sick" on the stomach in the mornings.  "I don't always feel like eating".  "I sometimes have a full feeling".   I referred him to the ED per the protocol.  He is going to [facility] now.    I sent my notes to Dr. Parks Ranger.  Reason for Disposition . [1] Chest pain (or "angina") comes and goes AND [2] is happening more often (increasing in frequency) or getting worse (increasing in severity)  Answer Assessment - Initial Assessment Questions 1. LOCATION: "Where does it hurt?"       It's hurting between my left breast and left armpit.   It's been going on a while but today it's worse.    I'm having a lot of indigestion.    I've been eating a bland diet that has helped a little. I had a ultrasound a couple of months ago and my heart is wonderful. 2. RADIATION: "Does the pain go anywhere else?" (e.g., into neck, jaw, arms, back)     A little upper back pain, once in a while I have pain in my neck.  No sweating, feel sick in the mornings.   I feel full.   I have diabetes.   I'll take my shot when I eat. ET: "When did the chest pain begin?" (Minutes, hours or days)      A couple of weeks.   It's right between my breast and armpit.   When I press on it it's sore. 4. PATTERN "Does the pain come and go, or has it been constant since it started?"  "Does it get worse with exertion?"      Aching pain.   It's not sharp.   I take aspirin 375 mg a day.   5. DURATION: "How long does it last" (e.g., seconds, minutes, hours)     It comes and goes.    Coughing, sneezing doesn't make me hurt.   At times it won't hurt at all for a couple of days. 6. SEVERITY: "How bad is the  pain?"  (e.g., Scale 1-10; mild, moderate, or severe)    - MILD (1-3): doesn't interfere with normal activities     - MODERATE (4-7): interferes with normal activities or awakens from sleep    - SEVERE (8-10): excruciating pain, unable to do any normal activities       5-6 on pain scale.   I does not bother me at night.   7. CARDIAC RISK FACTORS: "Do you have any history of heart problems or risk factors for heart disease?" (e.g., angina, prior heart attack; diabetes, high blood pressure, high cholesterol, smoker, or strong family history of heart disease)     No blood clots, have hypertension, IDDM, not a smoker, no heart disease in family 8. PULMONARY RISK FACTORS: "Do you have any history of lung disease?"  (e.g., blood clots in lung, asthma, emphysema, birth control pills)     No 9. CAUSE: "What do you think is causing the chest pain?"     I press in the middle where my ribs and it hurts.   After a big  meal I feel full and burping.   I wonder if I have an ulcer.  I have a lot of gas and heartburn. 10. OTHER SYMPTOMS: "Do you have any other symptoms?" (e.g., dizziness, nausea, vomiting, sweating, fever, difficulty breathing, cough)       Once in a while if I get up too fast I'm lightheaded.   That's been going on for a few months.  No shortness of breath.   I'm tired and lack of energy more than my usual lately. 11. PREGNANCY: "Is there any chance you are pregnant?" "When was your last menstrual period?"       N/A  Protocols used: CHEST PAIN-A-AH

## 2019-06-24 NOTE — Discharge Instructions (Addendum)
Please seek medical attention for any high fevers, chest pain, shortness of breath, change in behavior, persistent vomiting, bloody stool or any other new or concerning symptoms.  

## 2019-06-24 NOTE — ED Triage Notes (Signed)
Pt reports for the past few days he has been having central/left sided chest pain. Denies sob. Pt in NAD at this time.

## 2019-06-27 ENCOUNTER — Ambulatory Visit (INDEPENDENT_AMBULATORY_CARE_PROVIDER_SITE_OTHER): Payer: Medicare Other | Admitting: General Practice

## 2019-06-27 ENCOUNTER — Telehealth: Payer: Medicare Other | Admitting: General Practice

## 2019-06-27 DIAGNOSIS — E785 Hyperlipidemia, unspecified: Secondary | ICD-10-CM | POA: Diagnosis not present

## 2019-06-27 DIAGNOSIS — N1831 Chronic kidney disease, stage 3a: Secondary | ICD-10-CM | POA: Diagnosis not present

## 2019-06-27 DIAGNOSIS — F419 Anxiety disorder, unspecified: Secondary | ICD-10-CM

## 2019-06-27 DIAGNOSIS — I10 Essential (primary) hypertension: Secondary | ICD-10-CM | POA: Diagnosis not present

## 2019-06-27 DIAGNOSIS — R1013 Epigastric pain: Secondary | ICD-10-CM

## 2019-06-27 DIAGNOSIS — E1169 Type 2 diabetes mellitus with other specified complication: Secondary | ICD-10-CM

## 2019-06-27 DIAGNOSIS — Z794 Long term (current) use of insulin: Secondary | ICD-10-CM

## 2019-06-27 DIAGNOSIS — E1165 Type 2 diabetes mellitus with hyperglycemia: Secondary | ICD-10-CM

## 2019-06-27 NOTE — Chronic Care Management (AMB) (Signed)
Chronic Care Management   Follow Up Note   06/27/2019 Name: Charles York MRN: [id number] DOB: 09-01-1930  Referred by: [name], DO Reason for referral : Chronic Care Management (Follow up: HTN/HLD/DM2/CKD3/Anxiety)   Charles York is a 86 y.o. year old male who is a primary care patient of [name], DO. The CCM team was consulted for assistance with chronic disease management and care coordination needs.    Review of patient status, including review of consultants reports, relevant laboratory and other test results, and collaboration with appropriate care team members and the patient's provider was performed as part of comprehensive patient evaluation and provision of chronic care management services.    SDOH (Social Determinants of Health) assessments performed: Yes- stress  See Care Plan activities for detailed interventions related to [facility])     Outpatient Encounter Medications as of 06/27/2019  Medication Sig Note  . atorvastatin (LIPITOR) 40 MG tablet Take 1 tablet (40 mg total) by mouth at bedtime.   . busPIRone (BUSPAR) 5 MG tablet TAKE 1 TABLET(5 MG) BY MOUTH TWICE DAILY AS NEEDED   . ferrous sulfate 325 (65 FE) MG tablet Take 325 mg by mouth every other day.   Marland Kitchen HUMALOG KWIKPEN 100 UNIT/ML KwikPen Inject 0.12 mLs (12 Units total) into the skin 2 (two) times daily with a meal. Up to 50 units/day as directed by MD   . hydroxypropyl methylcellulose / hypromellose (ISOPTO TEARS / GONIOVISC) 2.5 % ophthalmic solution Place 1 drop into both eyes 4 (four) times daily.   . insulin glargine (LANTUS) 100 UNIT/ML injection Inject 0.27 mLs (27 Units total) into the skin daily before breakfast.   . ipratropium (ATROVENT) 0.06 % nasal spray USE 2 SPRAYS IN EACH NOSTRIL FOUR TIMES DAILY FOR UP TO 5 TO 7 DAYS THEN STOP 12/29/2018: Dropped to twice daily due to excessive dryness.  Marland Kitchen losartan (COZAAR) 25 MG tablet Take 1 tablet (25 mg total) by mouth daily.   Marland Kitchen  omeprazole (PRILOSEC) 40 MG capsule Take 1 capsule (40 mg total) by mouth daily before breakfast.   . polyethylene glycol powder (GLYCOLAX/MIRALAX) 17 GM/SCOOP powder Take 17-34 g by mouth 2 (two) times daily as needed for mild constipation or moderate constipation.   . sucralfate (CARAFATE) 1 g tablet Take 1 tablet (1 g total) by mouth 4 (four) times daily.   . vitamin B-12 (CYANOCOBALAMIN) 1000 MCG tablet Take 2,000 mcg by mouth daily.     No facility-administered encounter medications on file as of 06/27/2019.     Objective:   Goals Addressed            This Visit's Progress   . RNCM: pt-"I get a little down sometime thats why I take the Buspar" (pt-stated)       CARE PLAN ENTRY (see longtitudinal plan of care for additional care plan information)  Current Barriers:  . Chronic Disease Management support, education, and care coordination needs related to HTN, HLD, DMII, CKD Stage 3, and Anxiety  Clinical Goal(s) related to HTN, HLD, DMII, CKD Stage 3, and Anxiety:  Over the next 120 days, patient will:  . Work with the care management team to address educational, disease management, and care coordination needs  . Begin or continue self health monitoring activities as directed today Measure and record cbg (blood glucose) 2 times daily, Measure and record blood pressure 2 times per week, and adhere to a heart healthy/ADA diet . Call provider office for new or worsened signs  and symptoms Blood glucose findings outside established parameters, Blood pressure findings outside established parameters, Chest pain, and New or worsened symptom related to anxiety, ckd3 or other chronic conditions . Call care management team with questions or concerns . Verbalize basic understanding of patient centered plan of care established today  Interventions related to HTN, HLD, DMII, CKD Stage 3, and Anxiety:  . Evaluation of current treatment plans and patient's adherence to plan as established by  provider. The patient feels he does well at managing his care. His daughter lives with him but he is independent and cares for himself.  . Assessed patient understanding of disease states. Patient understands his chronic conditions. The patient was evaluated recently in the ED on 06-24-19 for chest pain but heart issues were ruled out. The patient states that they think he is having GI symptoms. The patient has follow up with the GI doctor on 07-06-2019.  The patient believes he may have an ulcer.  The patient states her "worries" a lot. Denies the need to talk to LCSW at this time. Emotional support provided.  . Assessed patient's education and care coordination needs.  The patient wanted to know what to do when he needed refills on his medications. The patient expressed he uses Buspar to help with his anxiety at times. He has lost his wife and son and sometimes he grieves over them.  . Empathetic listening and support as the patient expressed loss of his wife and son and how that impacts him at times. He is effectively managing. Enjoys talking to his sister who is 15 in [place]. The patients daughter lives with him and he is happy that she is there with him.  . Review of the patients blood sugar and blood pressure readings. The patient verbalized his blood sugars have been a little elevated lately but he has been eating a lot of soup and this am his blood sugar was 153.  The patient admits he has not been taking his blood pressure at home, has the ability to but hasn't. His blood pressure in the ED on 06/24/2019 was 156/70.   Marland Kitchen Provided disease specific education to patient. Education given on following a heart healthy/ADA diet and the benefits of watching dietary habits for health and well being.  Nash Dimmer with appropriate clinical care team members regarding patient needs. Review of CCM team pharmacist and LCSW. No needs at this time identified.   Patient Self Care Activities related to HTN, HLD, DMII,  CKD Stage 3, and Anxiety:  . Patient is unable to independently self-manage chronic health conditions  Please see past updates related to this goal by clicking on the "Past Updates" button in the selected goal          Plan:   The care management team will reach out to the patient again over the next 60 days.    Noreene Larsson RN, MSN, [place] Mobile: [phone]

## 2019-06-27 NOTE — Patient Instructions (Signed)
Visit Information  Goals Addressed            This Visit's Progress   . RNCM: pt-"I get a little down sometime thats why I take the Buspar" (pt-stated)       CARE PLAN ENTRY (see longtitudinal plan of care for additional care plan information)  Current Barriers:  . Chronic Disease Management support, education, and care coordination needs related to HTN, HLD, DMII, CKD Stage 3, and Anxiety  Clinical Goal(s) related to HTN, HLD, DMII, CKD Stage 3, and Anxiety:  Over the next 120 days, patient will:  . Work with the care management team to address educational, disease management, and care coordination needs  . Begin or continue self health monitoring activities as directed today Measure and record cbg (blood glucose) 2 times daily, Measure and record blood pressure 2 times per week, and adhere to a heart healthy/ADA diet . Call provider office for new or worsened signs and symptoms Blood glucose findings outside established parameters, Blood pressure findings outside established parameters, Chest pain, and New or worsened symptom related to anxiety, ckd3 or other chronic conditions . Call care management team with questions or concerns . Verbalize basic understanding of patient centered plan of care established today  Interventions related to HTN, HLD, DMII, CKD Stage 3, and Anxiety:  . Evaluation of current treatment plans and patient's adherence to plan as established by provider. The patient feels he does well at managing his care. His daughter lives with him but he is independent and cares for himself.  . Assessed patient understanding of disease states. Patient understands his chronic conditions. The patient was evaluated recently in the ED on 06-24-19 for chest pain but heart issues were ruled out. The patient states that they think he is having GI symptoms. The patient has follow up with the GI doctor on 07-06-2019.  The patient believes he may have an ulcer.  The patient states her  "worries" a lot. Denies the need to talk to LCSW at this time. Emotional support provided.  . Assessed patient's education and care coordination needs.  The patient wanted to know what to do when he needed refills on his medications. The patient expressed he uses Buspar to help with his anxiety at times. He has lost his wife and son and sometimes he grieves over them.  . Empathetic listening and support as the patient expressed loss of his wife and son and how that impacts him at times. He is effectively managing. Enjoys talking to his sister who is 15 in [place]. The patients daughter lives with him and he is happy that she is there with him.  . Review of the patients blood sugar and blood pressure readings. The patient verbalized his blood sugars have been a little elevated lately but he has been eating a lot of soup and this am his blood sugar was 153.  The patient admits he has not been taking his blood pressure at home, has the ability to but hasn't. His blood pressure in the ED on 06/24/2019 was 156/70.   Marland Kitchen Provided disease specific education to patient. Education given on following a heart healthy/ADA diet and the benefits of watching dietary habits for health and well being.  Nash Dimmer with appropriate clinical care team members regarding patient needs. Review of CCM team pharmacist and LCSW. No needs at this time identified.   Patient Self Care Activities related to HTN, HLD, DMII, CKD Stage 3, and Anxiety:  . Patient is unable to  independently self-manage chronic health conditions  Please see past updates related to this goal by clicking on the "Past Updates" button in the selected goal         Patient verbalizes understanding of instructions provided today.   The care management team will reach out to the patient again over the next 60 days.   Noreene Larsson RN, MSN, [place] Mobile:  [phone]

## 2019-07-01 ENCOUNTER — Ambulatory Visit (INDEPENDENT_AMBULATORY_CARE_PROVIDER_SITE_OTHER): Payer: Medicare Other | Admitting: Family Medicine

## 2019-07-01 ENCOUNTER — Other Ambulatory Visit: Payer: Self-pay

## 2019-07-01 ENCOUNTER — Encounter: Payer: Self-pay | Admitting: Family Medicine

## 2019-07-01 VITALS — BP 102/43 | HR 75 | Temp 97.5°F | Resp 16 | Ht 67.0 in | Wt 182.6 lb

## 2019-07-01 DIAGNOSIS — S29011A Strain of muscle and tendon of front wall of thorax, initial encounter: Secondary | ICD-10-CM

## 2019-07-01 NOTE — Patient Instructions (Addendum)
Thank you for coming to the office today.  Likely pec muscle strain and spasm, careful with re-aggravation with lifting , careful with overhead lifting  Use voltaren 2-3 times a day as needed  Follow up sooner or call if want to do ultrasound test  Keep apt in August  Please schedule a Follow-up Appointment to: Return if symptoms worsen or fail to improve, for keep apt in august.  If you have any other questions or concerns, please feel free to call the office or send a message through [facility]. You may also schedule an earlier appointment if necessary.  Additionally, you may be receiving a survey about your experience at our office within a few days to 1 week by e-mail or mail. We value your feedback.  Nobie Putnam, DO [place]

## 2019-07-01 NOTE — Progress Notes (Signed)
Subjective:    Patient ID: Charles York, male    DOB: 1930/12/29, 86 y.o.   MRN: [id number]  Charles York is a 86 y.o. male presenting on 07/01/2019 for Chest Muscle knot vs pain  Patient presents for a same day appointment.  HPI   Left Chest Wall Knot vs Pain Reports issue for past several monthss unsure onset, has had some discomfort with what felt like a knot in left axillary vs chest wall area. He says it is sore if he raises his left arm up above shoulder or in certain lifting position, he cannot always feel the knot. No redness or rash. Admits 5-6 lbs wt loss Reduced appetite in AM  Upcoming apt, [facility] on 07/06/19 endoscopy History of GERD on PPI omeprazole 40   Depression screen [facility] 2/9 05/02/2019 03/21/2019 01/07/2019  Decreased Interest 0 0 0  Down, Depressed, Hopeless 0 0 0  PHQ - 2 Score 0 0 0  Altered sleeping - - 0  Tired, decreased energy - - 0  Change in appetite - - 0  Feeling bad or failure about yourself  - - 0  Trouble concentrating - - 0  Moving slowly or fidgety/restless - - 0  Suicidal thoughts - - 0  PHQ-9 Score - - 0  Difficult doing work/chores - - -  Some recent data might be hidden    Social History   Tobacco Use  . Smoking status: Former Smoker    Types: Cigarettes    Quit date: 02/04/1983    Years since quitting: 36.4  . Smokeless tobacco: Former Network engineer Use Topics  . Alcohol use: No    Alcohol/week: 0.0 standard drinks  . Drug use: No    Review of Systems Per HPI unless specifically indicated above     Objective:    BP (!) 102/43   Pulse 75   Temp (!) 97.5 F (36.4 C) (Temporal)   Resp 16   Ht 5\' 7"  (1.702 m)   Wt 182 lb 9.6 oz (82.8 kg)   BMI 28.60 kg/m   Wt Readings from Last 3 Encounters:  07/01/19 182 lb 9.6 oz (82.8 kg)  06/24/19 187 lb 6.3 oz (85 kg)  04/08/19 188 lb (85.3 kg)    Physical Exam Vitals and nursing note reviewed.  Constitutional:      General: He is not in acute distress.    Appearance: He is  well-developed. He is not diaphoretic.     Comments: Well-appearing, comfortable, cooperative  HENT:     Head: Normocephalic and atraumatic.  Eyes:     General:        Right eye: No discharge.        Left eye: No discharge.     Conjunctiva/sclera: Conjunctivae normal.  Neck:     Thyroid: No thyromegaly.  Cardiovascular:     Rate and Rhythm: Normal rate and regular rhythm.     Heart sounds: Normal heart sounds. No murmur.  Pulmonary:     Effort: Pulmonary effort is normal. No respiratory distress.     Breath sounds: Normal breath sounds. No wheezing or rales.  Musculoskeletal:     Cervical back: Normal range of motion and neck supple.     Comments: Left chest wall, pectoral muscle with small area of slight induration and spasm with tenderness on palpation, possible deeper subcutaneous generalized density - Left shoulder ROM intact but has reproduced pain with abduction above his head, no axillary lymphadenopathy  Lymphadenopathy:  Cervical: No cervical adenopathy.  Skin:    General: Skin is warm and dry.     Findings: No erythema or rash.  Neurological:     Mental Status: He is alert and oriented to person, place, and time.  Psychiatric:        Behavior: Behavior normal.     Comments: Well groomed, good eye contact, normal speech and thoughts      I have personally reviewed the radiology report from 06/24/19 Chest X-ray.  CLINICAL DATA:  Chest pain.  EXAM: CHEST - 2 VIEW  COMPARISON:  None.  FINDINGS: A trace amount of atelectasis is seen within the bilateral lung bases. There is no evidence of acute infiltrate, pleural effusion or pneumothorax. The heart size and mediastinal contours are within normal limits. There is moderate severity calcification of the aortic arch. The visualized skeletal structures are unremarkable. Radiopaque surgical clips are seen within the right upper quadrant.  IMPRESSION: No active cardiopulmonary disease.   Electronically  Signed   By: Virgina Norfolk M.D.   On: 06/24/2019 15:27   Results for orders placed or performed during the hospital encounter of [id number]  Basic metabolic panel  Result Value Ref Range   Sodium 138 135 - 145 mmol/L   Potassium 5.2 (H) 3.5 - 5.1 mmol/L   Chloride 107 98 - 111 mmol/L   CO2 22 22 - 32 mmol/L   Glucose, Bld 174 (H) 70 - 99 mg/dL   BUN 42 (H) 8 - 23 mg/dL   Creatinine, Ser 1.51 (H) 0.61 - 1.24 mg/dL   Calcium 9.2 8.9 - 10.3 mg/dL   GFR calc non Af Amer 41 (L) >60 mL/min   GFR calc Af Amer 47 (L) >60 mL/min   Anion gap 9 5 - 15  CBC  Result Value Ref Range   WBC 10.3 4.0 - 10.5 K/uL   RBC 3.71 (L) 4.22 - 5.81 MIL/uL   Hemoglobin 12.2 (L) 13.0 - 17.0 g/dL   HCT 35.1 (L) 39.0 - 52.0 %   MCV 94.6 80.0 - 100.0 fL   MCH 32.9 26.0 - 34.0 pg   MCHC 34.8 30.0 - 36.0 g/dL   RDW 12.7 11.5 - 15.5 %   Platelets 212 150 - 400 K/uL   nRBC 0.0 0.0 - 0.2 %  Troponin I (High Sensitivity)  Result Value Ref Range   Troponin I (High Sensitivity) 6 <18 ng/L  Troponin I (High Sensitivity)  Result Value Ref Range   Troponin I (High Sensitivity) 7 <18 ng/L      Assessment & Plan:   Problem List Items Addressed This Visit    None    Visit Diagnoses    Strain of left pectoralis muscle, initial encounter    -  Primary      Clinically exam is reproducible MSK related pain on positional changes L shoulder in pectoral muscle, no obvious LAD identified on exam. Other generalized symptoms some unspecified weight loss seems appetite related CXR negative  Reassurance today, monitor symptoms. Seems to improve to muscle rub / topical treatment and OTC Follow-up sooner if indicated, consider US breast / mammogram option  No orders of the defined types were placed in this encounter.     Follow up plan: Return if symptoms worsen or fail to improve, for keep apt in august.   Kaoir Loree, [facility] 07/01/2019, 2:43 PM

## 2019-07-06 DIAGNOSIS — R110 Nausea: Secondary | ICD-10-CM | POA: Diagnosis not present

## 2019-07-06 DIAGNOSIS — R0789 Other chest pain: Secondary | ICD-10-CM | POA: Diagnosis not present

## 2019-07-06 DIAGNOSIS — R143 Flatulence: Secondary | ICD-10-CM | POA: Diagnosis not present

## 2019-07-06 DIAGNOSIS — N1831 Chronic kidney disease, stage 3a: Secondary | ICD-10-CM | POA: Diagnosis not present

## 2019-07-06 DIAGNOSIS — K219 Gastro-esophageal reflux disease without esophagitis: Secondary | ICD-10-CM | POA: Diagnosis not present

## 2019-07-19 ENCOUNTER — Other Ambulatory Visit: Payer: Self-pay | Admitting: Family Medicine

## 2019-07-19 DIAGNOSIS — E1140 Type 2 diabetes mellitus with diabetic neuropathy, unspecified: Secondary | ICD-10-CM

## 2019-07-19 NOTE — Telephone Encounter (Signed)
Requested Prescriptions  Pending Prescriptions Disp Refills  . LANTUS 100 UNIT/ML injection [Pharmacy Med Name: LANTUS U-100 INSULIN 10ML] 10 mL 3    Sig: INJECT 36 UNITS SUBCUTANEOUSLY BEFORE BREAKFAST     Endocrinology:  Diabetes - Insulins Passed - 07/19/2019  3:22 AM      Passed - HBA1C is between 0 and 7.9 and within 180 days    Hemoglobin A1C  Date Value Ref Range Status  03/21/2019 7.6 (A) 4.0 - 5.6 % Final   Hgb A1c MFr Bld  Date Value Ref Range Status  07/28/2018 7.4 (H) <5.7 % of total Hgb Final    Comment:    For someone without known diabetes, a hemoglobin A1c value of 6.5% or greater indicates that they may have  diabetes and this should be confirmed with a follow-up  test. . For someone with known diabetes, a value <7% indicates  that their diabetes is well controlled and a value  greater than or equal to 7% indicates suboptimal  control. A1c targets should be individualized based on  duration of diabetes, age, comorbid conditions, and  other considerations. . Currently, no consensus exists regarding use of hemoglobin A1c for diagnosis of diabetes for children. Renella Cunas - Valid encounter within last 6 months    Recent Outpatient Visits          2 weeks ago Strain of left pectoralis muscle, initial encounter   [place], DO   3 months ago Constipation, unspecified constipation type   [place], DO   4 months ago Type 2 diabetes, controlled, with neuropathy [facility])   [place], DO   5 months ago Acute non-recurrent maxillary sinusitis   [place], DO   6 months ago History of TIA (transient ischemic attack)   [facility], Devonne Doughty, DO

## 2019-08-11 ENCOUNTER — Other Ambulatory Visit: Payer: Self-pay | Admitting: Family Medicine

## 2019-08-25 ENCOUNTER — Telehealth: Payer: Medicare Other | Admitting: General Practice

## 2019-08-25 ENCOUNTER — Other Ambulatory Visit: Payer: Self-pay | Admitting: Family Medicine

## 2019-08-25 ENCOUNTER — Ambulatory Visit (INDEPENDENT_AMBULATORY_CARE_PROVIDER_SITE_OTHER): Payer: Medicare Other | Admitting: General Practice

## 2019-08-25 DIAGNOSIS — F419 Anxiety disorder, unspecified: Secondary | ICD-10-CM

## 2019-08-25 DIAGNOSIS — E785 Hyperlipidemia, unspecified: Secondary | ICD-10-CM

## 2019-08-25 DIAGNOSIS — I10 Essential (primary) hypertension: Secondary | ICD-10-CM

## 2019-08-25 DIAGNOSIS — E1169 Type 2 diabetes mellitus with other specified complication: Secondary | ICD-10-CM | POA: Diagnosis not present

## 2019-08-25 DIAGNOSIS — E1165 Type 2 diabetes mellitus with hyperglycemia: Secondary | ICD-10-CM

## 2019-08-25 DIAGNOSIS — N1831 Chronic kidney disease, stage 3a: Secondary | ICD-10-CM | POA: Diagnosis not present

## 2019-08-25 DIAGNOSIS — Z794 Long term (current) use of insulin: Secondary | ICD-10-CM

## 2019-08-25 NOTE — Patient Instructions (Signed)
Visit Information  Goals Addressed              This Visit's Progress   .  RNCM: pt-"I get a little down sometime thats why I take the Buspar" (pt-stated)        CARE PLAN ENTRY (see longtitudinal plan of care for additional care plan information)  Current Barriers:  . Chronic Disease Management support, education, and care coordination needs related to HTN, HLD, DMII, CKD Stage 3, and Anxiety  Clinical Goal(s) related to HTN, HLD, DMII, CKD Stage 3, and Anxiety:  Over the next 120 days, patient will:  . Work with the care management team to address educational, disease management, and care coordination needs  . Begin or continue self health monitoring activities as directed today Measure and record cbg (blood glucose) 2 times daily, Measure and record blood pressure 2 times per week, and adhere to a heart healthy/ADA diet . Call provider office for new or worsened signs and symptoms Blood glucose findings outside established parameters, Blood pressure findings outside established parameters, Chest pain, and New or worsened symptom related to anxiety, ckd3 or other chronic conditions . Call care management team with questions or concerns . Verbalize basic understanding of patient centered plan of care established today  Interventions related to HTN, HLD, DMII, CKD Stage 3, and Anxiety:  . Evaluation of current treatment plans and patient's adherence to plan as established by provider. The patient feels he does well at managing his care. His daughter lives with him but he is independent and cares for himself.  . Assessed patient understanding of disease states. Patient understands his chronic conditions. The patient was evaluated recently in the ED on 06-24-19 for chest pain but heart issues were ruled out. The patient states that they think he is having GI symptoms. The patient has follow up with the GI doctor on 07-06-2019.  The patient believes he may have an ulcer.  The patient states her  "worries" a lot. Denies the need to talk to LCSW at this time. Emotional support provided.  . Assessed patient's education and care coordination needs.  The patient wanted to know what to do when he needed refills on his medications. The patient expressed he uses Buspar to help with his anxiety at times. He has lost his wife and son and sometimes he grieves over them. 08-25-2019: The patient wanted the [facility] to let Dr. Parks Ranger know not to send any more insulin refills to [facility]. Will send an in basket message to the pcp.  The patient states that they lots of times can not get it and he worries about running out. He is now getting his insulin through the [place]. They ship him 3 bottles at a time.  He also wanted to let the pcp know that he had eye surgery on June the 14th at the [place]. He has a "gas bubble" in his right eye and he has to put drops in it TID.  He is doing well with recovery. Denies any new concerns at this time.  . Empathetic listening and support as the patient expressed loss of his wife and son and how that impacts him at times. He is effectively managing. Enjoys talking to his sister who is 22 in [place]. The patients daughter lives with him and he is happy that she is there with him.  . Review of the patients blood sugar and blood pressure readings. The patient verbalized his blood sugars have been a little elevated lately but  he has been eating a lot of soup and this am his blood sugar was 153.  The patient admits he has not been taking his blood pressure at home, has the ability to but hasn't. His blood pressure in the ED on 06/24/2019 was 156/70.   Marland Kitchen Provided disease specific education to patient. Education given on following a heart healthy/ADA diet and the benefits of watching dietary habits for health and well being. Patient is compliant with dietary restricitons . Collaborated with appropriate clinical care team members regarding patient needs. Review of CCM team pharmacist and LCSW. No  needs at this time identified.   Patient Self Care Activities related to HTN, HLD, DMII, CKD Stage 3, and Anxiety:  . Patient is unable to independently self-manage chronic health conditions  Please see past updates related to this goal by clicking on the "Past Updates" button in the selected goal         Patient verbalizes understanding of instructions provided today.   The care management team will reach out to the patient again over the next 60 to 90 days.   Noreene Larsson RN, MSN, [place] Mobile: [phone]

## 2019-08-25 NOTE — Chronic Care Management (AMB) (Signed)
Chronic Care Management   Follow Up Note   08/25/2019 Name: Charles York MRN: [id number] DOB: 1930-04-29  Referred by: [name], DO Reason for referral : Chronic Care Management (RNCM Follow up: Chronic Disease Management and Care Coordination Needs )   GREGORY BARRICK is a 86 y.o. year old male who is a primary care patient of [name], DO. The CCM team was consulted for assistance with chronic disease management and care coordination needs.    Review of patient status, including review of consultants reports, relevant laboratory and other test results, and collaboration with appropriate care team members and the patient's provider was performed as part of comprehensive patient evaluation and provision of chronic care management services.    SDOH (Social Determinants of Health) assessments performed: Yes See Care Plan activities for detailed interventions related to [facility])     Outpatient Encounter Medications as of 08/25/2019  Medication Sig Note   atorvastatin (LIPITOR) 40 MG tablet Take 1 tablet (40 mg total) by mouth at bedtime.    busPIRone (BUSPAR) 5 MG tablet TAKE 1 TABLET(5 MG) BY MOUTH TWICE DAILY AS NEEDED    ferrous sulfate 325 (65 FE) MG tablet Take 325 mg by mouth every other day.    HUMALOG 100 UNIT/ML injection ADMINISTER 12 UNITS UNDER THE SKIN TWICE DAILY WITH A MEAL    HUMALOG KWIKPEN 100 UNIT/ML KwikPen Inject 0.12 mLs (12 Units total) into the skin 2 (two) times daily with a meal. Up to 50 units/day as directed by MD    hydroxypropyl methylcellulose / hypromellose (ISOPTO TEARS / GONIOVISC) 2.5 % ophthalmic solution Place 1 drop into both eyes 4 (four) times daily.    ipratropium (ATROVENT) 0.06 % nasal spray USE 2 SPRAYS IN EACH NOSTRIL FOUR TIMES DAILY FOR UP TO 5 TO 7 DAYS THEN STOP 12/29/2018: Dropped to twice daily due to excessive dryness.   LANTUS 100 UNIT/ML injection INJECT 27 UNITS SUBCUTANEOUSLY BEFORE BREAKFAST     losartan (COZAAR) 25 MG tablet Take 1 tablet (25 mg total) by mouth daily.    omeprazole (PRILOSEC) 40 MG capsule Take 1 capsule (40 mg total) by mouth daily before breakfast.    polyethylene glycol powder (GLYCOLAX/MIRALAX) 17 GM/SCOOP powder Take 17-34 g by mouth 2 (two) times daily as needed for mild constipation or moderate constipation.    sucralfate (CARAFATE) 1 g tablet Take 1 tablet (1 g total) by mouth 4 (four) times daily.    vitamin B-12 (CYANOCOBALAMIN) 1000 MCG tablet Take 2,000 mcg by mouth daily.     No facility-administered encounter medications on file as of 08/25/2019.     Objective:  BP Readings from Last 3 Encounters:  07/01/19 (!) 102/43  06/24/19 (!) 141/68  05/02/19 (!) 150/60    Goals Addressed              This Visit's Progress     RNCM: pt-"I get a little down sometime thats why I take the Buspar" (pt-stated)        CARE PLAN ENTRY (see longtitudinal plan of care for additional care plan information)  Current Barriers:   Chronic Disease Management support, education, and care coordination needs related to HTN, HLD, DMII, CKD Stage 3, and Anxiety  Clinical Goal(s) related to HTN, HLD, DMII, CKD Stage 3, and Anxiety:  Over the next 120 days, patient will:   Work with the care management team to address educational, disease management, and care coordination needs   Begin or continue self  health monitoring activities as directed today Measure and record cbg (blood glucose) 2 times daily, Measure and record blood pressure 2 times per week, and adhere to a heart healthy/ADA diet  Call provider office for new or worsened signs and symptoms Blood glucose findings outside established parameters, Blood pressure findings outside established parameters, Chest pain, and New or worsened symptom related to anxiety, ckd3 or other chronic conditions  Call care management team with questions or concerns  Verbalize basic understanding of patient centered plan of  care established today  Interventions related to HTN, HLD, DMII, CKD Stage 3, and Anxiety:   Evaluation of current treatment plans and patient's adherence to plan as established by provider. The patient feels he does well at managing his care. His daughter lives with him but he is independent and cares for himself.   Assessed patient understanding of disease states. Patient understands his chronic conditions. The patient was evaluated recently in the ED on 06-24-19 for chest pain but heart issues were ruled out. The patient states that they think he is having GI symptoms. The patient has follow up with the GI doctor on 07-06-2019.  The patient believes he may have an ulcer.  The patient states her "worries" a lot. Denies the need to talk to LCSW at this time. Emotional support provided.   Assessed patient's education and care coordination needs.  The patient wanted to know what to do when he needed refills on his medications. The patient expressed he uses Buspar to help with his anxiety at times. He has lost his wife and son and sometimes he grieves over them. 08-25-2019: The patient wanted the [facility] to let Dr. Parks Ranger know not to send any more insulin refills to [facility]. Will send an in basket message to the pcp.  The patient states that they lots of times can not get it and he worries about running out. He is now getting his insulin through the [place]. They ship him 3 bottles at a time.  He also wanted to let the pcp know that he had eye surgery on June the 14th at the [place]. He has a "gas bubble" in his right eye and he has to put drops in it TID.  He is doing well with recovery. Denies any new concerns at this time.   Empathetic listening and support as the patient expressed loss of his wife and son and how that impacts him at times. He is effectively managing. Enjoys talking to his sister who is 52 in [place]. The patients daughter lives with him and he is happy that she is there with him.   Review of the  patients blood sugar and blood pressure readings. The patient verbalized his blood sugars have been a little elevated lately but he has been eating a lot of soup and this am his blood sugar was 153.  The patient admits he has not been taking his blood pressure at home, has the ability to but hasn't. His blood pressure in the ED on 06/24/2019 was 156/70.    Provided disease specific education to patient. Education given on following a heart healthy/ADA diet and the benefits of watching dietary habits for health and well being. Patient is compliant with dietary restricitons  Collaborated with appropriate clinical care team members regarding patient needs. Review of CCM team pharmacist and LCSW. No needs at this time identified.   Patient Self Care Activities related to HTN, HLD, DMII, CKD Stage 3, and Anxiety:   Patient is unable  to independently self-manage chronic health conditions  Please see past updates related to this goal by clicking on the "Past Updates" button in the selected goal          Plan:   The care management team will reach out to the patient again over the next 60 to 90 days.    Noreene Larsson RN, MSN, [place] Mobile: [phone]

## 2019-09-06 ENCOUNTER — Ambulatory Visit (INDEPENDENT_AMBULATORY_CARE_PROVIDER_SITE_OTHER): Payer: Medicare Other

## 2019-09-06 VITALS — Ht 70.0 in | Wt 180.0 lb

## 2019-09-06 DIAGNOSIS — Z0000 Encounter for general adult medical examination without abnormal findings: Secondary | ICD-10-CM | POA: Diagnosis not present

## 2019-09-06 NOTE — Progress Notes (Signed)
I connected with Charles York today by telephone and verified that I am speaking with the correct person using two identifiers. Location patient: home Location provider: work Persons participating in the virtual visit: Charles York, [place] LPN.   I discussed the limitations, risks, security and privacy concerns of performing an evaluation and management service by telephone and the availability of in person appointments. I also discussed with the patient that there may be a patient responsible charge related to this service. The patient expressed understanding and verbally consented to this telephonic visit.    Interactive audio and video telecommunications were attempted between this provider and patient, however failed, due to patient having technical difficulties OR patient did not have access to video capability.  We continued and completed visit with audio only.    Vital signs may be patient reported or missing   Subjective:   Charles York is a 86 y.o. male who presents for Medicare Annual/Subsequent preventive examination.  Review of Systems     Cardiac Risk Factors include: advanced age (>57men, >55 women);diabetes mellitus;dyslipidemia;hypertension;male gender     Objective:    Today's Vitals   09/06/19 1016  Weight: 180 lb (81.6 kg)  Height: 5\' 10"  (1.778 m)   Body mass index is 25.83 kg/m.  Advanced Directives 09/06/2019 06/24/2019 12/29/2018 03/30/2018 02/04/2018 01/22/2018 11/04/2016  Does Patient Have a Medical Advance Directive? No No No No No No No  Would patient like information on creating a medical advance directive? - No - Patient declined - No - Patient declined No - Patient declined No - Patient declined No - Patient declined    Current Medications (verified) Outpatient Encounter Medications as of 09/06/2019  Medication Sig  . atorvastatin (LIPITOR) 40 MG tablet Take 1 tablet (40 mg total) by mouth at bedtime.  . busPIRone (BUSPAR) 5 MG tablet TAKE 1  TABLET(5 MG) BY MOUTH TWICE DAILY AS NEEDED  . ferrous sulfate 325 (65 FE) MG tablet Take 325 mg by mouth every other day.  . hydroxypropyl methylcellulose / hypromellose (ISOPTO TEARS / GONIOVISC) 2.5 % ophthalmic solution Place 1 drop into both eyes 4 (four) times daily.  . insulin aspart protamine - aspart (NOVOLOG 70/30 MIX) (70-30) 100 UNIT/ML FlexPen Inject 12 Units into the skin 2 (two) times daily. In the morning and evening  . ipratropium (ATROVENT) 0.06 % nasal spray USE 2 SPRAYS IN EACH NOSTRIL FOUR TIMES DAILY FOR UP TO 5 TO 7 DAYS THEN STOP  . LANTUS 100 UNIT/ML injection INJECT 27 UNITS SUBCUTANEOUSLY BEFORE BREAKFAST  . losartan (COZAAR) 25 MG tablet Take 1 tablet (25 mg total) by mouth daily.  Marland Kitchen omeprazole (PRILOSEC) 40 MG capsule Take 1 capsule (40 mg total) by mouth daily before breakfast.  . polyethylene glycol powder (GLYCOLAX/MIRALAX) 17 GM/SCOOP powder Take 17-34 g by mouth 2 (two) times daily as needed for mild constipation or moderate constipation.  . sucralfate (CARAFATE) 1 g tablet Take 1 tablet (1 g total) by mouth 4 (four) times daily.  . vitamin B-12 (CYANOCOBALAMIN) 1000 MCG tablet Take 2,000 mcg by mouth daily.   Marland Kitchen HUMALOG 100 UNIT/ML injection ADMINISTER 12 UNITS UNDER THE SKIN TWICE DAILY WITH A MEAL (Patient not taking: Reported on 09/06/2019)  . HUMALOG KWIKPEN 100 UNIT/ML KwikPen Inject 0.12 mLs (12 Units total) into the skin 2 (two) times daily with a meal. Up to 50 units/day as directed by MD (Patient not taking: Reported on 09/06/2019)   No facility-administered encounter medications on file as of 09/06/2019.  Allergies (verified) Trulicity [dulaglutide] and Penicillins   History: Past Medical History:  Diagnosis Date  . Anxiety   . Arthritis   . Cancer ([place])    skin  . Chronic kidney disease    stage 3  . Colon polyp   . Diabetes mellitus without complication ([place])   . GERD (gastroesophageal reflux disease)   . Heartburn   . Hyperlipemia   .  Hypertension   . Obesity    Past Surgical History:  Procedure Laterality Date  . CHOLECYSTECTOMY N/A 02/04/2018   Procedure: LAPAROSCOPIC CHOLECYSTECTOMY;  Surgeon: Johnathan Hausen, MD;  Location: [facility] ORS;  Service: General;  Laterality: N/A;  . EYE SURGERY  11/2017   fluid in back of eye/ injection every 3 months. done at [place]  . EYE SURGERY Bilateral    cataracts extracted   Family History  Problem Relation Age of Onset  . Cancer Father        carcinoma  . Alzheimer's disease Mother    Social History   Socioeconomic History  . Marital status: Widowed    Spouse name: Not on file  . Number of children: Not on file  . Years of education: Not on file  . Highest education level: High school graduate  Occupational History  . 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 Smoking status: Former Smoker    Types: Cigarettes    Quit date: 02/04/1983    Years since quitting: 36.6  . Smokeless tobacco: Former Network engineer  . Vaping Use: Never used  Substance and Sexual Activity  . Alcohol use: No    Alcohol/week: 0.0 standard drinks  . Drug use: No  . Sexual activity: Not on file  Other Topics Concern  . Not on file  Social History Narrative  . Not on file   Social Determinants of Health   Financial Resource Strain: Low Risk   . Difficulty of Paying Living Expenses: Not hard at all  Food Insecurity: No Food Insecurity  . Worried About Charity fundraiser in the Last Year: Never true  . Ran Out of Food in the Last Year: Never true  Transportation Needs: No Transportation Needs  . Lack of Transportation (Medical): No  . Lack of Transportation (Non-Medical): No  Physical Activity: Sufficiently Active  . Days of Exercise per Week: 7 days  . Minutes of Exercise per Session: 30 min  Stress: No Stress Concern Present  . Feeling of Stress : Not at all  Social Connections: Socially Isolated  . Frequency of Communication with Friends and Family:  More than three times a week  . Frequency of Social Gatherings with Friends and Family: More than three times a week  . Attends Religious Services: Never  . Active Member of Clubs or Organizations: No  . Attends Archivist Meetings: Never  . Marital Status: Widowed    Tobacco Counseling Counseling given: Not Answered   Clinical Intake:  Pre-visit preparation completed: Yes  Pain : No/denies pain     Nutritional Status: BMI 25 -29 Overweight Nutritional Risks: None Diabetes: Yes  How often do you need to have someone help you when you read instructions, pamphlets, or other written materials from your doctor or pharmacy?: 1 - Never What is the last grade level you completed in school?: 12th grade  Diabetic? Yes Nutrition Risk Assessment:  Has the patient had any N/V/D within the last 2 months?  No  Does  the patient have any non-healing wounds?  No  Has the patient had any unintentional weight loss or weight gain?  No   Diabetes:  Is the patient diabetic?  Yes  If diabetic, was a CBG obtained today?  No  Did the patient bring in their glucometer from home?  No  How often do you monitor your CBG's? Twice daily.   Financial Strains and Diabetes Management:  Are you having any financial strains with the device, your supplies or your medication? No .  Does the patient want to be seen by Chronic Care Management for management of their diabetes?  No  Would the patient like to be referred to a Nutritionist or for Diabetic Management?  No   Diabetic Exams:  Diabetic Eye Exam: Completed 08/30/2019 Diabetic Foot Exam: Completed 03/21/2019   Interpreter Needed?: No  Information entered by :: NAllen LPN   Activities of Daily Living In your present state of health, do you have any difficulty performing the following activities: 09/06/2019 04/08/2019  Hearing? Y N  Comment wears hearing aides -  Vision? Y N  Difficulty concentrating or making decisions? N N  Walking  or climbing stairs? N Y  Dressing or bathing? N N  Doing errands, shopping? N N  Preparing Food and eating ? N -  Using the Toilet? N -  In the past six months, have you accidently leaked urine? N -  Do you have problems with loss of bowel control? N -  Managing your Medications? N -  Managing your Finances? N -  Housekeeping or managing your Housekeeping? N -  Some recent data might be hidden    Patient Care Team: [name], DO as PCP - General (Family Medicine) Vanita Ingles, RN as Case Manager (General Practice)  Indicate any recent Medical Services you may have received from other than [facility] providers in the past year (date may be approximate).     Assessment:   This is a routine wellness examination for Charles York.  Hearing/Vision screen  Hearing Screening   125Hz  250Hz  500Hz  1000Hz  2000Hz  3000Hz  4000Hz  6000Hz  8000Hz   Right ear:           Left ear:           Vision Screening Comments[facility]  Dietary issues and exercise activities discussed: Current Exercise Habits: Home exercise routine, Type of exercise: walking, Time (Minutes): 30, Frequency (Times/Week): 7, Weekly Exercise (Minutes/Week): 210  Goals    .  Increase water intake      Recommend eliminating [employer] and replacing with 4-6 glasses of water per day    .  Patient Stated      09/06/2019, wants to get to 170 pounds    .  RNCM: pt-"I get a little down sometime thats why I take the Buspar" (pt-stated)      CARE PLAN ENTRY (see longtitudinal plan of care for additional care plan information)  Current Barriers:  . Chronic Disease Management support, education, and care coordination needs related to HTN, HLD, DMII, CKD Stage 3, and Anxiety  Clinical Goal(s) related to HTN, HLD, DMII, CKD Stage 3, and Anxiety:  Over the next 120 days, patient will:  . Work with the care management team to address educational, disease management, and care coordination needs  . Begin or continue  self health monitoring activities as directed today Measure and record cbg (blood glucose) 2 times daily, Measure and record blood pressure 2 times per week, and adhere  to a heart healthy/ADA diet . Call provider office for new or worsened signs and symptoms Blood glucose findings outside established parameters, Blood pressure findings outside established parameters, Chest pain, and New or worsened symptom related to anxiety, ckd3 or other chronic conditions . Call care management team with questions or concerns . Verbalize basic understanding of patient centered plan of care established today  Interventions related to HTN, HLD, DMII, CKD Stage 3, and Anxiety:  . Evaluation of current treatment plans and patient's adherence to plan as established by provider. The patient feels he does well at managing his care. His daughter lives with him but he is independent and cares for himself.  . Assessed patient understanding of disease states. Patient understands his chronic conditions. The patient was evaluated recently in the ED on 06-24-19 for chest pain but heart issues were ruled out. The patient states that they think he is having GI symptoms. The patient has follow up with the GI doctor on 07-06-2019.  The patient believes he may have an ulcer.  The patient states her "worries" a lot. Denies the need to talk to LCSW at this time. Emotional support provided.  . Assessed patient's education and care coordination needs.  The patient wanted to know what to do when he needed refills on his medications. The patient expressed he uses Buspar to help with his anxiety at times. He has lost his wife and son and sometimes he grieves over them. 08-25-2019: The patient wanted the [facility] to let Dr. Parks Ranger know not to send any more insulin refills to [facility]. Will send an in basket message to the pcp.  The patient states that they lots of times can not get it and he worries about running out. He is now getting his insulin  through the [place]. They ship him 3 bottles at a time.  He also wanted to let the pcp know that he had eye surgery on June the 14th at the [place]. He has a "gas bubble" in his right eye and he has to put drops in it TID.  He is doing well with recovery. Denies any new concerns at this time.  . Empathetic listening and support as the patient expressed loss of his wife and son and how that impacts him at times. He is effectively managing. Enjoys talking to his sister who is 43 in [place]. The patients daughter lives with him and he is happy that she is there with him.  . Review of the patients blood sugar and blood pressure readings. The patient verbalized his blood sugars have been a little elevated lately but he has been eating a lot of soup and this am his blood sugar was 153.  The patient admits he has not been taking his blood pressure at home, has the ability to but hasn't. His blood pressure in the ED on 06/24/2019 was 156/70.   Marland Kitchen Provided disease specific education to patient. Education given on following a heart healthy/ADA diet and the benefits of watching dietary habits for health and well being. Patient is compliant with dietary restricitons . Collaborated with appropriate clinical care team members regarding patient needs. Review of CCM team pharmacist and LCSW. No needs at this time identified.   Patient Self Care Activities related to HTN, HLD, DMII, CKD Stage 3, and Anxiety:  . Patient is unable to independently self-manage chronic health conditions  Please see past updates related to this goal by clicking on the "Past Updates" button in the selected goal  Depression Screen PHQ 2/9 Scores 09/06/2019 05/02/2019 03/21/2019 01/07/2019 11/23/2018 08/17/2018 04/16/2018  PHQ - 2 Score 0 0 0 0 0 0 0  PHQ- 9 Score - - - 0 10 - 0  Exception Documentation - - - - - - -    Fall Risk Fall Risk  09/06/2019 01/07/2019 11/23/2018 08/17/2018 04/16/2018  Falls in the past year? 0 0 0 0 0  Number falls in past  yr: - 0 - - -  Injury with Fall? - 0 - - -  Risk for fall due to : Medication side effect - - - -  Risk for fall due to: Comment - - - - -  Follow up Falls evaluation completed;Education provided;Falls prevention discussed Falls evaluation completed - Falls evaluation completed Falls evaluation completed    Any stairs in or around the home? Yes  If so, are there any without handrails? Yes  Home free of loose throw rugs in walkways, pet beds, electrical cords, etc? Yes  Adequate lighting in your home to reduce risk of falls? Yes   ASSISTIVE DEVICES UTILIZED TO PREVENT FALLS:  Life alert? No  Use of a cane, walker or w/c? No  Grab bars in the bathroom? Yes  Shower chair or bench in shower? Yes  Elevated toilet seat or a handicapped toilet? No   TIMED UP AND GO:  Was the test performed? No .    Cognitive Function:     6CIT Screen 09/06/2019 03/30/2018 11/04/2016  What Year? 0 points 0 points 0 points  What month? 0 points 0 points 0 points  What time? 0 points 0 points 3 points  Count back from 20 0 points 0 points 0 points  Months in reverse 0 points 0 points 0 points  Repeat phrase 0 points 0 points 2 points  Total Score 0 0 5    Immunizations Immunization History  Administered Date(s) Administered  . Fluad Quad(high Dose 65+) 10/14/2018  . Influenza-Unspecified 10/29/2015, 10/25/2016, 11/24/2017  . PFIZER SARS-COV-2 Vaccination 03/03/2019, 03/28/2019  . Pneumococcal Conjugate-13 05/07/2016  . Pneumococcal Polysaccharide-23 11/24/2017  . Zoster Recombinat (Shingrix) 05/07/2016, 10/27/2016    TDAP status: Up to date Flu Vaccine status: Due Pneumococcal vaccine status: Up to date Covid-19 vaccine status: Completed vaccines  Qualifies for Shingles Vaccine? Yes   Zostavax completed Yes   Shingrix Completed?: Yes  Screening Tests Health Maintenance  Topic Date Due  . OPHTHALMOLOGY EXAM  08/04/2019  . INFLUENZA VACCINE  09/04/2019  . HEMOGLOBIN A1C  09/18/2019  .  TETANUS/TDAP  02/04/2020  . FOOT EXAM  03/20/2020  . COVID-19 Vaccine  Completed  . PNA vac Low Risk Adult  Completed    Health Maintenance  Health Maintenance Due  Topic Date Due  . OPHTHALMOLOGY EXAM  08/04/2019  . INFLUENZA VACCINE  09/04/2019    Colorectal cancer screening: No longer required.   Lung Cancer Screening: (Low Dose CT Chest recommended if Age 45-80 years, 30 pack-year currently smoking OR have quit w/in 15years.) does not qualify.   Lung Cancer Screening Referral: no   Additional Screening:  Hepatitis C Screening: does not qualify;  Vision Screening: Recommended annual ophthalmology exams for early detection of glaucoma and other disorders of the eye. Is the patient up to date with their annual eye exam?  Yes  Who is the provider or what is the name of the office in which the patient attends annual [facility] exams[facility] If pt is not established with a provider, would they like to  be referred to a provider to establish care? No .   Dental Screening: Recommended annual dental exams for proper oral hygiene  Community Resource Referral / Chronic Care Management: CRR required this visit?  No   CCM required this visit?  No      Plan:     I have personally reviewed and noted the following in the patient's chart:   . Medical and social history . Use of alcohol, tobacco or illicit drugs  . Current medications and supplements . Functional ability and status . Nutritional status . Physical activity . Advanced directives . List of other physicians . Hospitalizations, surgeries, and ER visits in previous 12 months . Vitals . Screenings to include cognitive, depression, and falls . Referrals and appointments  In addition, I have reviewed and discussed with patient certain preventive protocols, quality metrics, and best practice recommendations. A written personalized care plan for preventive services as well as general preventive health recommendations were  provided to patient.   Due to this being a telephonic visit, the after visit summary with patients personalized plan was offered to patient via mail or my-chart. Patient would like to access on my-chart  Kellie Simmering, LPN   [id number]/07/3014   Nurse Notes:

## 2019-09-06 NOTE — Patient Instructions (Signed)
Charles York , Thank you for taking time to come for your Medicare Wellness Visit. I appreciate your ongoing commitment to your health goals. Please review the following plan we discussed and let me know if I can assist you in the future.   Screening recommendations/referrals: Colonoscopy: not required Recommended yearly ophthalmology/optometry visit for glaucoma screening and checkup Recommended yearly dental visit for hygiene and checkup  Vaccinations: Influenza vaccine: due Pneumococcal vaccine: completed 11/24/2017 Tdap vaccine: completed 02/03/2010 Shingles vaccine: completed   Covid-19: 03/28/2019, 03/03/2019  Advanced directives: Advance directive discussed with you today.    Conditions/risks identified: none  Next appointment: Follow up in one year for your annual wellness visit.   Preventive Care 50 Years and Older, Male Preventive care refers to lifestyle choices and visits with your health care provider that can promote health and wellness. What does preventive care include?  A yearly physical exam. This is also called an annual well check.  Dental exams once or twice a year.  Routine eye exams. Ask your health care provider how often you should have your eyes checked.  Personal lifestyle choices, including:  Daily care of your teeth and gums.  Regular physical activity.  Eating a healthy diet.  Avoiding tobacco and drug use.  Limiting alcohol use.  Practicing safe sex.  Taking low doses of aspirin every day.  Taking vitamin and mineral supplements as recommended by your health care provider. What happens during an annual well check? The services and screenings done by your health care provider during your annual well check will depend on your age, overall health, lifestyle risk factors, and family history of disease. Counseling  Your health care provider may ask you questions about your:  Alcohol use.  Tobacco use.  Drug use.  Emotional  well-being.  Home and relationship well-being.  Sexual activity.  Eating habits.  History of falls.  Memory and ability to understand (cognition).  Work and work Statistician. Screening  You may have the following tests or measurements:  Height, weight, and BMI.  Blood pressure.  Lipid and cholesterol levels. These may be checked every 5 years, or more frequently if you are over 5 years old.  Skin check.  Lung cancer screening. You may have this screening every year starting at age 72 if you have a 30-pack-year history of smoking and currently smoke or have quit within the past 15 years.  Fecal occult blood test (FOBT) of the stool. You may have this test every year starting at age 38.  Flexible sigmoidoscopy or colonoscopy. You may have a sigmoidoscopy every 5 years or a colonoscopy every 10 years starting at age 37.  Prostate cancer screening. Recommendations will vary depending on your family history and other risks.  Hepatitis C blood test.  Hepatitis B blood test.  Sexually transmitted disease (STD) testing.  Diabetes screening. This is done by checking your blood sugar (glucose) after you have not eaten for a while (fasting). You may have this done every 1-3 years.  Abdominal aortic aneurysm (AAA) screening. You may need this if you are a current or former smoker.  Osteoporosis. You may be screened starting at age 1 if you are at high risk. Talk with your health care provider about your test results, treatment options, and if necessary, the need for more tests. Vaccines  Your health care provider may recommend certain vaccines, such as:  Influenza vaccine. This is recommended every year.  Tetanus, diphtheria, and acellular pertussis (Tdap, Td) vaccine. You may need a  Td booster every 10 years.  Zoster vaccine. You may need this after age 41.  Pneumococcal 13-valent conjugate (PCV13) vaccine. One dose is recommended after age 78.  Pneumococcal  polysaccharide (PPSV23) vaccine. One dose is recommended after age 81. Talk to your health care provider about which screenings and vaccines you need and how often you need them. This information is not intended to replace advice given to you by your health care provider. Make sure you discuss any questions you have with your health care provider. Document Released: 02/16/2015 Document Revised: 10/10/2015 Document Reviewed: 11/21/2014 Elsevier Interactive Patient Education  2017 [place] Prevention in the Home Falls can cause injuries. They can happen to people of all ages. There are many things you can do to make your home safe and to help prevent falls. What can I do on the outside of my home?  Regularly fix the edges of walkways and driveways and fix any cracks.  Remove anything that might make you trip as you walk through a door, such as a raised step or threshold.  Trim any bushes or trees on the path to your home.  Use bright outdoor lighting.  Clear any walking paths of anything that might make someone trip, such as rocks or tools.  Regularly check to see if handrails are loose or broken. Make sure that both sides of any steps have handrails.  Any raised decks and porches should have guardrails on the edges.  Have any leaves, snow, or ice cleared regularly.  Use sand or salt on walking paths during winter.  Clean up any spills in your garage right away. This includes oil or grease spills. What can I do in the bathroom?  Use night lights.  Install grab bars by the toilet and in the tub and shower. Do not use towel bars as grab bars.  Use non-skid mats or decals in the tub or shower.  If you need to sit down in the shower, use a plastic, non-slip stool.  Keep the floor dry. Clean up any water that spills on the floor as soon as it happens.  Remove soap buildup in the tub or shower regularly.  Attach bath mats securely with double-sided non-slip rug  tape.  Do not have throw rugs and other things on the floor that can make you trip. What can I do in the bedroom?  Use night lights.  Make sure that you have a light by your bed that is easy to reach.  Do not use any sheets or blankets that are too big for your bed. They should not hang down onto the floor.  Have a firm chair that has side arms. You can use this for support while you get dressed.  Do not have throw rugs and other things on the floor that can make you trip. What can I do in the kitchen?  Clean up any spills right away.  Avoid walking on wet floors.  Keep items that you use a lot in easy-to-reach places.  If you need to reach something above you, use a strong step stool that has a grab bar.  Keep electrical cords out of the way.  Do not use floor polish or wax that makes floors slippery. If you must use wax, use non-skid floor wax.  Do not have throw rugs and other things on the floor that can make you trip. What can I do with my stairs?  Do not leave any items on the stairs.  Make sure that there are handrails on both sides of the stairs and use them. Fix handrails that are broken or loose. Make sure that handrails are as long as the stairways.  Check any carpeting to make sure that it is firmly attached to the stairs. Fix any carpet that is loose or worn.  Avoid having throw rugs at the top or bottom of the stairs. If you do have throw rugs, attach them to the floor with carpet tape.  Make sure that you have a light switch at the top of the stairs and the bottom of the stairs. If you do not have them, ask someone to add them for you. What else can I do to help prevent falls?  Wear shoes that:  Do not have high heels.  Have rubber bottoms.  Are comfortable and fit you well.  Are closed at the toe. Do not wear sandals.  If you use a stepladder:  Make sure that it is fully opened. Do not climb a closed stepladder.  Make sure that both sides of the  stepladder are locked into place.  Ask someone to hold it for you, if possible.  Clearly mark and make sure that you can see:  Any grab bars or handrails.  First and last steps.  Where the edge of each step is.  Use tools that help you move around (mobility aids) if they are needed. These include:  Canes.  Walkers.  Scooters.  Crutches.  Turn on the lights when you go into a dark area. Replace any light bulbs as soon as they burn out.  Set up your furniture so you have a clear path. Avoid moving your furniture around.  If any of your floors are uneven, fix them.  If there are any pets around you, be aware of where they are.  Review your medicines with your doctor. Some medicines can make you feel dizzy. This can increase your chance of falling. Ask your doctor what other things that you can do to help prevent falls. This information is not intended to replace advice given to you by your health care provider. Make sure you discuss any questions you have with your health care provider. Document Released: 11/16/2008 Document Revised: 06/28/2015 Document Reviewed: 02/24/2014 Elsevier Interactive Patient Education  2017 [employer].

## 2019-09-12 ENCOUNTER — Other Ambulatory Visit: Payer: Medicare Other

## 2019-09-12 ENCOUNTER — Other Ambulatory Visit: Payer: Self-pay

## 2019-09-12 DIAGNOSIS — E785 Hyperlipidemia, unspecified: Secondary | ICD-10-CM | POA: Diagnosis not present

## 2019-09-12 DIAGNOSIS — N1831 Chronic kidney disease, stage 3a: Secondary | ICD-10-CM

## 2019-09-12 DIAGNOSIS — I10 Essential (primary) hypertension: Secondary | ICD-10-CM | POA: Diagnosis not present

## 2019-09-12 DIAGNOSIS — E1169 Type 2 diabetes mellitus with other specified complication: Secondary | ICD-10-CM

## 2019-09-12 DIAGNOSIS — E1140 Type 2 diabetes mellitus with diabetic neuropathy, unspecified: Secondary | ICD-10-CM

## 2019-09-12 DIAGNOSIS — R351 Nocturia: Secondary | ICD-10-CM

## 2019-09-12 DIAGNOSIS — Z0000 Encounter for general adult medical examination without abnormal findings: Secondary | ICD-10-CM

## 2019-09-12 DIAGNOSIS — D508 Other iron deficiency anemias: Secondary | ICD-10-CM

## 2019-09-13 LAB — COMPLETE METABOLIC PANEL WITH GFR
AG Ratio: 1.5 (calc) (ref 1.0–2.5)
ALT: 19 U/L (ref 9–46)
AST: 13 U/L (ref 10–35)
Albumin: 4 g/dL (ref 3.6–5.1)
Alkaline phosphatase (APISO): 104 U/L (ref 35–144)
BUN/Creatinine Ratio: 18 (calc) (ref 6–22)
BUN: 25 mg/dL (ref 7–25)
CO2: 26 mmol/L (ref 20–32)
Calcium: 8.9 mg/dL (ref 8.6–10.3)
Chloride: 104 mmol/L (ref 98–110)
Creat: 1.4 mg/dL — ABNORMAL HIGH (ref 0.70–1.11)
GFR, Est African American: 52 mL/min/{1.73_m2} — ABNORMAL LOW (ref >=60)
GFR, Est Non African American: 45 mL/min/{1.73_m2} — ABNORMAL LOW (ref >=60)
Globulin: 2.6 g/dL (calc) (ref 1.9–3.7)
Glucose, Bld: 186 mg/dL — ABNORMAL HIGH (ref 65–99)
Potassium: 5.2 mmol/L (ref 3.5–5.3)
Sodium: 138 mmol/L (ref 135–146)
Total Bilirubin: 0.4 mg/dL (ref 0.2–1.2)
Total Protein: 6.6 g/dL (ref 6.1–8.1)

## 2019-09-13 LAB — CBC WITH DIFFERENTIAL/PLATELET
Absolute Monocytes: 578 cells/uL (ref 200–950)
Basophils Absolute: 69 cells/uL (ref 0–200)
Basophils Relative: 0.7 %
Eosinophils Absolute: 196 cells/uL (ref 15–500)
Eosinophils Relative: 2 %
HCT: 40.7 % (ref 38.5–50.0)
Hemoglobin: 13.1 g/dL — ABNORMAL LOW (ref 13.2–17.1)
Lymphs Abs: 4547 cells/uL — ABNORMAL HIGH (ref 850–3900)
MCH: 32.3 pg (ref 27.0–33.0)
MCHC: 32.2 g/dL (ref 32.0–36.0)
MCV: 100.2 fL — ABNORMAL HIGH (ref 80.0–100.0)
MPV: 11.3 fL (ref 7.5–12.5)
Monocytes Relative: 5.9 %
Neutro Abs: 4410 cells/uL (ref 1500–7800)
Neutrophils Relative %: 45 %
Platelets: 276 10*3/uL (ref 140–400)
RBC: 4.06 10*6/uL — ABNORMAL LOW (ref 4.20–5.80)
RDW: 12 % (ref 11.0–15.0)
Total Lymphocyte: 46.4 %
WBC: 9.8 10*3/uL (ref 3.8–10.8)

## 2019-09-13 LAB — LIPID PANEL
Cholesterol: 147 mg/dL (ref <200)
HDL: 51 mg/dL (ref >=40)
LDL Cholesterol (Calc): 75 mg/dL (calc)
Non-HDL Cholesterol (Calc): 96 mg/dL (calc) (ref <130)
Total CHOL/HDL Ratio: 2.9 (calc) (ref <5.0)
Triglycerides: 125 mg/dL (ref <150)

## 2019-09-13 LAB — HEMOGLOBIN A1C
Hgb A1c MFr Bld: 7.6 % of total Hgb — ABNORMAL HIGH (ref <5.7)
Mean Plasma Glucose: 171 (calc)
eAG (mmol/L): 9.5 (calc)

## 2019-09-13 LAB — PSA: PSA: 0.6 ng/mL (ref <=4.0)

## 2019-09-19 ENCOUNTER — Encounter: Payer: Self-pay | Admitting: Family Medicine

## 2019-09-19 ENCOUNTER — Ambulatory Visit (INDEPENDENT_AMBULATORY_CARE_PROVIDER_SITE_OTHER): Payer: Medicare Other | Admitting: Family Medicine

## 2019-09-19 ENCOUNTER — Other Ambulatory Visit: Payer: Self-pay

## 2019-09-19 ENCOUNTER — Ambulatory Visit
Admission: RE | Admit: 2019-09-19 | Discharge: 2019-09-19 | Disposition: A | Discharge status: Home / Self Care | Payer: Medicare Other | Source: Ambulatory Visit | Attending: Family Medicine | Admitting: Family Medicine

## 2019-09-19 VITALS — BP 121/63 | HR 72 | Temp 97.5°F | Resp 16 | Ht 67.0 in | Wt 184.0 lb

## 2019-09-19 DIAGNOSIS — K219 Gastro-esophageal reflux disease without esophagitis: Secondary | ICD-10-CM

## 2019-09-19 DIAGNOSIS — M7551 Bursitis of right shoulder: Secondary | ICD-10-CM

## 2019-09-19 DIAGNOSIS — I10 Essential (primary) hypertension: Secondary | ICD-10-CM

## 2019-09-19 DIAGNOSIS — E11319 Type 2 diabetes mellitus with unspecified diabetic retinopathy without macular edema: Secondary | ICD-10-CM

## 2019-09-19 DIAGNOSIS — M25511 Pain in right shoulder: Secondary | ICD-10-CM | POA: Insufficient documentation

## 2019-09-19 DIAGNOSIS — R110 Nausea: Secondary | ICD-10-CM

## 2019-09-19 DIAGNOSIS — G8929 Other chronic pain: Secondary | ICD-10-CM | POA: Diagnosis not present

## 2019-09-19 MED ORDER — LIDOCAINE HCL (PF) 1 % IJ SOLN
4.0000 mL | Freq: Once | INTRAMUSCULAR | Status: AC
  Administered 2019-09-19: 4 mL

## 2019-09-19 MED ORDER — OMEPRAZOLE 20 MG PO CPDR: 20.0000 mg | DELAYED_RELEASE_CAPSULE | Freq: Two times a day (BID) | ORAL | 2 refills | Status: DC

## 2019-09-19 MED ORDER — METHYLPREDNISOLONE ACETATE 40 MG/ML IJ SUSP
40.0000 mg | Freq: Once | INTRAMUSCULAR | Status: AC
  Administered 2019-09-19: 40 mg via INTRA_ARTICULAR

## 2019-09-19 NOTE — Patient Instructions (Addendum)
Thank you for coming to the office today.  You received a Right Shoulder Joint steroid injection today. - Lidocaine numbing medicine may ease the pain initially for a few hours until it wears off - As discussed, you may experience a "steroid flare" this evening or within 24-48 hours, anytime medicine is injected into an inflamed joint it can cause the pain to get worse temporarily - Everyone responds differently to these injections, it depends on the patient and the severity of the joint problem, it may provide anywhere from days to weeks, to months of relief. Ideal response is >6 months relief - Try to take it easy for next 1-2 days, avoid over activity and strain on joint (limit lifting for shoulder) - Recommend the following:   - For swelling - rest, compression sleeve / ACE wrap, elevation, and ice packs as needed for first few days   - For pain in future may use heating pad or moist heat as needed  Medication  May keep using the topical Voltaren gel  Stop taking OMeprazole 40mg  daliy in AM, now switch to new rx Omeprazole 20mg  TWICE a day for nausea    Please schedule a Follow-up Appointment to: Return in about 6 weeks (around 10/31/2019) for 6 week follow-up R Shoulder Pain / Nausea.  If you have any other questions or concerns, please feel free to call the office or send a message through [place]. You may also schedule an earlier appointment if necessary.  Additionally, you may be receiving a survey about your experience at our office within a few days to 1 week by e-mail or mail. We value your feedback.  Nobie Putnam, DO [place]

## 2019-09-19 NOTE — Progress Notes (Signed)
Subjective:    Patient ID: Charles York, male    DOB: 10/03/1930, 86 y.o.   MRN: [id number]  Charles York is a 86 y.o. male presenting on 09/19/2019 for Shoulder Pain and Nausea  Recently had labs drawn, but he declines physical today, has acute issues.  HPI    Right Shoulder Pain, acute on chronic Osteoarthritis - Last visit with me 06/15/18 and 04/2018, for same problem, treated with subacromial steroid injection x 2, see prior notes for background information. - Interval update with improved shoulder pain and function with steroid shots but have worn off >3-6 months - Now today here with same complaint, had been 15 months since last injection - Today says pain wakes him up at night with R shoulder, recently has repetitive activity with R shoulder affecting it - Less often taking Tylenol 2-3 pills q 8 hr PRN and using Voltaren gel topical from [place] with some benefit Denies any redness, swelling, joint swelling other joint pain or injury, numbness tingling weakness  Nausea / Reduced Appetite GERD Reports persistent problem for past few months with increased nausea without vomiting in AM. In past he was taking Omeprazole 20mg  BID and did better, we changed him to 40mg  daily in AM and seems has more nausea in AM, it is before taking his meds. He says no other associated symptoms, no abdominal pain or cramping or diarrhea. Not always taking iron pill.  Health Maintenance: UTD COVID vaccine Due Flu vaccine when available.  Depression screen [facility] 2/9 09/19/2019 09/06/2019 05/02/2019  Decreased Interest 0 0 0  Down, Depressed, Hopeless 0 0 0  PHQ - 2 Score 0 0 0  Altered sleeping - - -  Tired, decreased energy - - -  Change in appetite - - -  Feeling bad or failure about yourself  - - -  Trouble concentrating - - -  Moving slowly or fidgety/restless - - -  Suicidal thoughts - - -  PHQ-9 Score - - -  Difficult doing work/chores - - -  Some recent data might be hidden    Social History     Tobacco Use  . Smoking status: Former Smoker    Types: Cigarettes    Quit date: 02/04/1983    Years since quitting: 36.6  . Smokeless tobacco: Former Network engineer  . Vaping Use: Never used  Substance Use Topics  . Alcohol use: No    Alcohol/week: 0.0 standard drinks  . Drug use: No    Review of Systems Per HPI unless specifically indicated above     Objective:    BP 121/63   Pulse 72   Temp (!) 97.5 F (36.4 C) (Temporal)   Resp 16   Ht 5\' 7"  (1.702 m)   Wt 184 lb (83.5 kg)   SpO2 98%   BMI 28.82 kg/m   Wt Readings from Last 3 Encounters:  09/19/19 184 lb (83.5 kg)  09/06/19 180 lb (81.6 kg)  07/01/19 182 lb 9.6 oz (82.8 kg)    Physical Exam Vitals and nursing note reviewed.  Constitutional:      General: He is not in acute distress.    Appearance: He is well-developed. He is not diaphoretic.     Comments: Well-appearing, comfortable, cooperative  HENT:     Head: Normocephalic and atraumatic.     Ears:     Comments: Hearing aids in place Eyes:     General:        Right eye:  No discharge.        Left eye: No discharge.     Conjunctiva/sclera: Conjunctivae normal.  Neck:     Thyroid: No thyromegaly.  Cardiovascular:     Rate and Rhythm: Normal rate and regular rhythm.     Heart sounds: Normal heart sounds. No murmur heard.   Pulmonary:     Effort: Pulmonary effort is normal. No respiratory distress.     Breath sounds: Normal breath sounds. No wheezing or rales.  Musculoskeletal:     Cervical back: Normal range of motion and neck supple.     Comments: Bilateral Shoulder Inspection: Normal appearance bilateral symmetrical Palpation: Mild tender to palpation over anterior shoulder bilateral. Non tender lateral, or posterior shoulder  ROM: Similar to last time mild RIGHT reduced internal rotation and full forward flexion above shoulder, has decently preserved flexion and abduction ROM, some discomfort pain at top of arc Special Testing: BILATERAL  Rotator cuff testing negative for weakness with supraspinatus full can and empty can test but positive for some mild pain / discomfort - RIGHT > Left Hawkin's AC impingement POSITIVE for pain Strength: Normal strength 5/5 flex/ext, ext rot / int rot, grip, rotator cuff str testing. Neurovascular: Distally intact pulses, sensation to light touch   Lymphadenopathy:     Cervical: No cervical adenopathy.  Skin:    General: Skin is warm and dry.     Findings: No erythema or rash.  Neurological:     Mental Status: He is alert and oriented to person, place, and time.  Psychiatric:        Behavior: Behavior normal.     Comments: Well groomed, good eye contact, normal speech and thoughts     ________________________________________________________ PROCEDURE NOTE Date: 09/19/19 RIGHT SHOULDER subacromial injection Discussed benefits and risks (including pain, bleeding, infection, steroid flare). Verbal consent given by patient. Medication:  1 cc Depo-medrol 40mg  and 4 cc Lidocaine 1% without epi Time Out taken  Landmarks identified. Area cleansed with alcohol wipes. Using 21 gauge and 1, 1/2 inch needle, Right subacromial bursa space was injected (with above listed medication) via posterior approach cold spray used for superficial anesthetic. Sterile bandage placed. Patient tolerated procedure well without bleeding or paresthesias. No complications.    Results for orders placed or performed in visit on 09/12/19  PSA  Result Value Ref Range   PSA 0.6 < OR = 4.0 ng/mL  Lipid panel  Result Value Ref Range   Cholesterol 147 <200 mg/dL   HDL 51 > OR = 40 mg/dL   Triglycerides 125 <150 mg/dL   LDL Cholesterol (Calc) 75 mg/dL (calc)   Total CHOL/HDL Ratio 2.9 <5.0 (calc)   Non-HDL Cholesterol (Calc) 96 <130 mg/dL (calc)  COMPLETE METABOLIC PANEL WITH GFR  Result Value Ref Range   Glucose, Bld 186 (H) 65 - 99 mg/dL   BUN 25 7 - 25 mg/dL   Creat 1.40 (H) 0.70 - 1.11 mg/dL   GFR, Est Non  African American 45 (L) > OR = 60 mL/min/1.44m2   GFR, Est African American 52 (L) > OR = 60 mL/min/1.58m2   BUN/Creatinine Ratio 18 6 - 22 (calc)   Sodium 138 135 - 146 mmol/L   Potassium 5.2 3.5 - 5.3 mmol/L   Chloride 104 98 - 110 mmol/L   CO2 26 20 - 32 mmol/L   Calcium 8.9 8.6 - 10.3 mg/dL   Total Protein 6.6 6.1 - 8.1 g/dL   Albumin 4.0 3.6 - 5.1 g/dL   Globulin  2.6 1.9 - 3.7 g/dL (calc)   AG Ratio 1.5 1.0 - 2.5 (calc)   Total Bilirubin 0.4 0.2 - 1.2 mg/dL   Alkaline phosphatase (APISO) 104 35 - 144 U/L   AST 13 10 - 35 U/L   ALT 19 9 - 46 U/L  CBC with Differential/Platelet  Result Value Ref Range   WBC 9.8 3.8 - 10.8 Thousand/uL   RBC 4.06 (L) 4.20 - 5.80 Million/uL   Hemoglobin 13.1 (L) 13.2 - 17.1 g/dL   HCT 40.7 38 - 50 %   MCV 100.2 (H) 80.0 - 100.0 fL   MCH 32.3 27.0 - 33.0 pg   MCHC 32.2 32.0 - 36.0 g/dL   RDW 12.0 11.0 - 15.0 %   Platelets 276 140 - 400 Thousand/uL   MPV 11.3 7.5 - 12.5 fL   Neutro Abs 4,410 1,500 - 7,800 cells/uL   Lymphs Abs 4,547 (H) 850 - 3,900 cells/uL   Absolute Monocytes 578 200 - 950 cells/uL   Eosinophils Absolute 196 15 - 500 cells/uL   Basophils Absolute 69 0 - 200 cells/uL   Neutrophils Relative % 45 %   Total Lymphocyte 46.4 %   Monocytes Relative 5.9 %   Eosinophils Relative 2.0 %   Basophils Relative 0.7 %  Hemoglobin A1c  Result Value Ref Range   Hgb A1c MFr Bld 7.6 (H) <5.7 % of total Hgb   Mean Plasma Glucose 171 (calc)   eAG (mmol/L) 9.5 (calc)      Assessment & Plan:   Problem List Items Addressed This Visit    GERD (gastroesophageal reflux disease)   Relevant Medications   omeprazole (PRILOSEC) 20 MG capsule   Essential hypertension   Relevant Medications   aspirin 325 MG EC tablet   Diabetic retinopathy (HCC) - Primary   Relevant Medications   aspirin 325 MG EC tablet   Chronic right shoulder pain   Relevant Medications   aspirin 325 MG EC tablet   Other Relevant Orders   DG Shoulder Right    Other  Visit Diagnoses    Nausea       Relevant Medications   omeprazole (PRILOSEC) 20 MG capsule   Chronic bursitis of right shoulder       Relevant Medications   lidocaine (PF) (XYLOCAINE) 1 % injection 4 mL (Completed)   methylPREDNISolone acetate (DEPO-MEDROL) injection 40 mg (Completed)   Other Relevant Orders   DG Shoulder Right      Recent acute on chronic flare up of R shoulder pain Interval improvement s/p R subacromial injection x2 mostly chronic bilateral R>L shoulder bursitis vs rotator cuff tendinopathy without significant reduced ROM, and no significant evidence of muscle tear (no weakness). No clear etiology of injury.14 old patient with likely underlying arthritis DUE for updated R shoulder X-ray will get today  Plan: RIGHT shoulder subacromial steroid injection performed today, see procedure note for details.  1. Use topical voltaren existing rx from [facility] should AVOID oral NSAID 2. May take Tylenol Ex Str 1-2 q 6 hr PRN 3. Relative rest but keep shoulder mobile, demonstrated ROM exercises, avoid heavy lifting 4. May try heating pad PRN  Future can return to Orthopedics (prior referral to [facility])  ----------  Nausea GERD Likely secondary to some chronic GERD issue that has bothered him, previously did better on 20mg  BID PPI, now ineffective 40 daily in AM, will switch omeprazole back to 20mg  BID dosing. No other meds that usually cause nausea, seems transient and  in AM only improve with meal, discussed nutrition Reassuring no weight loss, has good appetite  Orders Placed This Encounter  Procedures  . DG Shoulder Right    Standing Status:   Future    Number of Occurrences:   1    Standing Expiration Date:   09/18/2020    Order Specific Question:   Reason for Exam (SYMPTOM  OR DIAGNOSIS REQUIRED)    Answer:   chronic >1-2 year R shoulder pain, with history arthritis bursitis, no injury or trauma    Order Specific Question:   Preferred imaging  location?    Answer:   [facility]    Order Specific Question:   Radiology Contrast Protocol - do NOT remove file path    Answer:   [email or web address]    Meds ordered this encounter  Medications  . omeprazole (PRILOSEC) 20 MG capsule    Sig: Take 1 capsule (20 mg total) by mouth 2 (two) times daily before a meal.    Dispense:  60 capsule    Refill:  2    Changed from 40mg   . lidocaine (PF) (XYLOCAINE) 1 % injection 4 mL  . methylPREDNISolone acetate (DEPO-MEDROL) injection 40 mg      Follow up plan: Return in about 6 weeks (around 10/31/2019) for 6 week follow-up R Shoulder Pain / Nausea.  >3 months if improved and does not need to return sooner.  Nobie Putnam, [facility] 09/19/2019, 9:38 AM

## 2019-10-05 ENCOUNTER — Emergency Department: Payer: Medicare Other

## 2019-10-05 ENCOUNTER — Other Ambulatory Visit: Payer: Self-pay

## 2019-10-05 DIAGNOSIS — R079 Chest pain, unspecified: Secondary | ICD-10-CM | POA: Diagnosis not present

## 2019-10-05 DIAGNOSIS — J90 Pleural effusion, not elsewhere classified: Secondary | ICD-10-CM | POA: Diagnosis not present

## 2019-10-05 DIAGNOSIS — Z5321 Procedure and treatment not carried out due to patient leaving prior to being seen by health care provider: Secondary | ICD-10-CM | POA: Insufficient documentation

## 2019-10-05 DIAGNOSIS — M25512 Pain in left shoulder: Secondary | ICD-10-CM | POA: Diagnosis not present

## 2019-10-05 DIAGNOSIS — M79601 Pain in right arm: Secondary | ICD-10-CM | POA: Diagnosis not present

## 2019-10-05 DIAGNOSIS — R42 Dizziness and giddiness: Secondary | ICD-10-CM | POA: Diagnosis not present

## 2019-10-05 LAB — CBC
HCT: 35.9 % — ABNORMAL LOW (ref 39.0–52.0)
Hemoglobin: 12 g/dL — ABNORMAL LOW (ref 13.0–17.0)
MCH: 32.7 pg (ref 26.0–34.0)
MCHC: 33.4 g/dL (ref 30.0–36.0)
MCV: 97.8 fL (ref 80.0–100.0)
Platelets: 177 10*3/uL (ref 150–400)
RBC: 3.67 MIL/uL — ABNORMAL LOW (ref 4.22–5.81)
RDW: 12.6 % (ref 11.5–15.5)
WBC: 10.6 10*3/uL — ABNORMAL HIGH (ref 4.0–10.5)
nRBC: 0 % (ref 0.0–0.2)

## 2019-10-05 LAB — TROPONIN I (HIGH SENSITIVITY): Troponin I (High Sensitivity): 7 ng/L (ref <18)

## 2019-10-05 LAB — BASIC METABOLIC PANEL
Anion gap: 8 (ref 5–15)
BUN: 26 mg/dL — ABNORMAL HIGH (ref 8–23)
CO2: 24 mmol/L (ref 22–32)
Calcium: 8.7 mg/dL — ABNORMAL LOW (ref 8.9–10.3)
Chloride: 106 mmol/L (ref 98–111)
Creatinine, Ser: 1.3 mg/dL — ABNORMAL HIGH (ref 0.61–1.24)
GFR calc Af Amer: 57 mL/min — ABNORMAL LOW (ref >60)
GFR calc non Af Amer: 49 mL/min — ABNORMAL LOW (ref >60)
Glucose, Bld: 106 mg/dL — ABNORMAL HIGH (ref 70–99)
Potassium: 4.5 mmol/L (ref 3.5–5.1)
Sodium: 138 mmol/L (ref 135–145)

## 2019-10-05 NOTE — ED Triage Notes (Signed)
Pt comes POV with central chest pain radiating to his left shoulder and arm starting this afternoon. Ambulatory to triage. Endorses some dizziness as well. Hx of HTN.

## 2019-10-06 ENCOUNTER — Emergency Department
Admission: EM | Admit: 2019-10-06 | Discharge: 2019-10-06 | Disposition: A | Discharge status: Home / Self Care | Payer: Medicare Other | Attending: Emergency Medicine | Admitting: Emergency Medicine

## 2019-10-07 ENCOUNTER — Other Ambulatory Visit: Payer: Self-pay | Admitting: Family Medicine

## 2019-10-07 ENCOUNTER — Telehealth: Payer: Self-pay

## 2019-10-07 NOTE — Telephone Encounter (Signed)
As per patient taken both med's together and felt dizzy yesterday did not wait after triage since long waiting --had blood work , EKG and Xray done. Advised him if he gets worst during weekend he needs to stay in the hospital, he is talking about depression and mentioned did not tell Dr Raliegh Ip about it at the last visit--- asking for appointment have scheduled for next week 10/12/2019 but also informed that if Dr Raliegh Ip think he does not need appt and one of the CCM team PAM or Brooke needs to call him then will cancel the appointment.

## 2019-10-07 NOTE — Telephone Encounter (Signed)
Gevena Barre, Can you call Mr. Mcphee back. I can't today. He needs to separate the 2 medications by 2 hours. He can take but take carafate and wait two hours before taking omeprazole. Thanks, Pam

## 2019-10-07 NOTE — Telephone Encounter (Signed)
Called patient back.  He reports the recent scenario as described below.  He says feeling more dizzy. He asks about meds. Taking Sucralfate / Omeprazole together, I advised him these should not provoke those symptoms, should be safe to take together.  I advised him Buspar has high risk of dizziness. He thinks this may be contributing. We agreed to HOLD or STOP Buspar 5mg  BID for now until his apt next week on Weds 9/8. See if it helps.  Also we reviewed his ED results, and discussed with him. Reassuring except his Hemoglobin mildly lower 12.  He stopped oral iron due to side effect constipation.  He agrees to trial it back at intermittent dose one every other or every few days instead of daily.  Advised we may consider Hematology if his iron keeps going lower and he has dizziness reduced energy can be sign of iron deficiency or vitamin deficiency.  We will talk on Weds 9/8 further may consider labs. He also admits depression, has past history of depression. Had been doing better by his report but now he says was not reporting it. He feels safe, and appreciates my phone call this evening, he has no other needs right now.  Nobie Putnam, [facility] 10/07/2019, 6:35 PM

## 2019-10-07 NOTE — Telephone Encounter (Signed)
Copied from [place] [id number]. Topic: General - Other >> Oct 07, 2019  1:59 PM Yvette Rack wrote: Reason for CRM: Pt stated the last time that he was in the office he spoke with a nurse that gave him a phone # to call her. Pt requests that phone#.

## 2019-10-07 NOTE — Telephone Encounter (Signed)
Patient was asking for PAM' s number --reason wants to find out if he can take omeprazole and Carafate together.  Noreene Larsson RN, MSN, [place] Mobile: [phone]

## 2019-10-12 ENCOUNTER — Ambulatory Visit (INDEPENDENT_AMBULATORY_CARE_PROVIDER_SITE_OTHER): Payer: Medicare Other | Admitting: Family Medicine

## 2019-10-12 ENCOUNTER — Other Ambulatory Visit: Payer: Self-pay

## 2019-10-12 ENCOUNTER — Encounter: Payer: Self-pay | Admitting: Family Medicine

## 2019-10-12 VITALS — BP 106/56 | HR 80 | Temp 97.7°F | Resp 16 | Ht 67.0 in | Wt 181.0 lb

## 2019-10-12 DIAGNOSIS — F411 Generalized anxiety disorder: Secondary | ICD-10-CM | POA: Insufficient documentation

## 2019-10-12 DIAGNOSIS — F3342 Major depressive disorder, recurrent, in full remission: Secondary | ICD-10-CM | POA: Insufficient documentation

## 2019-10-12 DIAGNOSIS — Z23 Encounter for immunization: Secondary | ICD-10-CM

## 2019-10-12 DIAGNOSIS — F331 Major depressive disorder, recurrent, moderate: Secondary | ICD-10-CM

## 2019-10-12 MED ORDER — ESCITALOPRAM OXALATE 10 MG PO TABS: 10.0000 mg | ORAL_TABLET | Freq: Every day | ORAL | 2 refills | Status: DC

## 2019-10-12 NOTE — Patient Instructions (Addendum)
Thank you for coming to the office today.  Remain OFF Buspirone - this was likely making you dizzy.  Start back on Escitalopram 10mg  - take HALF pill daily for 2-4 weeks then INCREASE to 1 whole tablet 10mg  daily for mood / anxiety  We will reach out to [employer], Education officer, museum she can call you with more advice on counseling for depression, and we can try to arrange other resources if needed.  Keep up with your GI specialist on the digestive symptoms.   Please schedule a Follow-up Appointment to: Return in about 3 months (around 01/11/2020) for 3 month Mood/Anxety PHQ, med adjust.  If you have any other questions or concerns, please feel free to call the office or send a message through [facility]. You may also schedule an earlier appointment if necessary.  Additionally, you may be receiving a survey about your experience at our office within a few days to 1 week by e-mail or mail. We value your feedback.  Nobie Putnam, DO [place]
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
## 2019-10-13 ENCOUNTER — Ambulatory Visit (INDEPENDENT_AMBULATORY_CARE_PROVIDER_SITE_OTHER): Payer: Medicare Other | Admitting: General Practice

## 2019-10-13 DIAGNOSIS — F331 Major depressive disorder, recurrent, moderate: Secondary | ICD-10-CM | POA: Diagnosis not present

## 2019-10-13 DIAGNOSIS — N1831 Chronic kidney disease, stage 3a: Secondary | ICD-10-CM | POA: Diagnosis not present

## 2019-10-13 DIAGNOSIS — E1165 Type 2 diabetes mellitus with hyperglycemia: Secondary | ICD-10-CM | POA: Diagnosis not present

## 2019-10-13 DIAGNOSIS — F419 Anxiety disorder, unspecified: Secondary | ICD-10-CM

## 2019-10-13 DIAGNOSIS — I10 Essential (primary) hypertension: Secondary | ICD-10-CM

## 2019-10-13 DIAGNOSIS — E1169 Type 2 diabetes mellitus with other specified complication: Secondary | ICD-10-CM

## 2019-10-13 DIAGNOSIS — E785 Hyperlipidemia, unspecified: Secondary | ICD-10-CM

## 2019-10-13 DIAGNOSIS — Z794 Long term (current) use of insulin: Secondary | ICD-10-CM

## 2019-10-13 DIAGNOSIS — F411 Generalized anxiety disorder: Secondary | ICD-10-CM

## 2019-10-13 NOTE — Chronic Care Management (AMB) (Signed)
Chronic Care Management   Follow Up Note   10/13/2019 Name: Charles York MRN: [id number] DOB: 02-20-1930  Referred by: Charles York Reason for referral : Chronic Care Management (RNCM follow up for Chronic Disease Managemnt and Care Coordination Needs)   Charles York is a 86 y.o. year old male who is a primary care patient of Charles York. The CCM team was consulted for assistance with chronic disease management and care coordination needs.    Review of patient status, including review of consultants reports, relevant laboratory and other test results, and collaboration with appropriate care team members and the patient's provider was performed as part of comprehensive patient evaluation and provision of chronic care management services.    SDOH (Social Determinants of Health) assessments performed: Yes See Care Plan activities for detailed interventions related to [facility])     Outpatient Encounter Medications as of 10/13/2019  Medication Sig Note   aspirin 325 MG EC tablet TK 1 T PO QD FOR UP TO 90 DAYS THEN CAN REDUCE TO 81 MG DAILY    atorvastatin (LIPITOR) 40 MG tablet Take 1 tablet (40 mg total) by mouth at bedtime.    escitalopram (LEXAPRO) 10 MG tablet Take 1 tablet (10 mg total) by mouth daily. Start with half pill (5mg  dose) once daily, increase to 10mg  after 2-4 weeks.    ferrous sulfate 325 (65 FE) MG tablet Take 325 mg by mouth every other day.    HUMALOG 100 UNIT/ML injection ADMINISTER 12 UNITS UNDER THE SKIN TWICE DAILY WITH A MEAL    HUMALOG KWIKPEN 100 UNIT/ML KwikPen Inject 0.12 mLs (12 Units total) into the skin 2 (two) times daily with a meal. Up to 50 units/day as directed by MD    hydroxypropyl methylcellulose / hypromellose (ISOPTO TEARS / GONIOVISC) 2.5 % ophthalmic solution Place 1 drop into both eyes 4 (four) times daily.    insulin aspart protamine - aspart (NOVOLOG 70/30 MIX) (70-30) 100 UNIT/ML FlexPen Inject 12 Units into  the skin 2 (two) times daily. In the morning and evening    ipratropium (ATROVENT) 0.06 % nasal spray USE 2 SPRAYS IN EACH NOSTRIL FOUR TIMES DAILY FOR UP TO 5 TO 7 DAYS THEN STOP 12/29/2018: Dropped to twice daily due to excessive dryness.   LANTUS 100 UNIT/ML injection INJECT 27 UNITS SUBCUTANEOUSLY BEFORE BREAKFAST    losartan (COZAAR) 25 MG tablet Take 1 tablet (25 mg total) by mouth daily.    omeprazole (PRILOSEC) 20 MG capsule Take 1 capsule (20 mg total) by mouth 2 (two) times daily before a meal.    polyethylene glycol powder (GLYCOLAX/MIRALAX) 17 GM/SCOOP powder Take 17-34 g by mouth 2 (two) times daily as needed for mild constipation or moderate constipation.    sucralfate (CARAFATE) 1 g tablet Take 1 tablet (1 g total) by mouth 4 (four) times daily.    vitamin B-12 (CYANOCOBALAMIN) 1000 MCG tablet Take 2,000 mcg by mouth daily.     No facility-administered encounter medications on file as of 10/13/2019.     Objective:  BP Readings from Last 3 Encounters:  10/12/19 (!) 106/56  10/05/19 (!) 156/75  09/19/19 121/63    Goals Addressed              This Visit's Progress     RNCM: pt-"I am feeling better since seeing Charles York" (pt-stated)        [place] (see longtitudinal plan of care for additional care plan information)  Current Barriers:   Chronic Disease Management support, education, and care coordination needs related to HTN, HLD, DMII, CKD Stage 3, Anxiety, and Depression  Clinical Goal(s) related to HTN, HLD, DMII, CKD Stage 3, Anxiety, and Depression:  Over the next 120 days, patient will:   Work with the care management team to address educational, disease management, and care coordination needs   Begin or continue self health monitoring activities as directed today Measure and record cbg (blood glucose) 2 times daily, Measure and record blood pressure 2 times per week, and adhere to a heart healthy/ADA diet  Call provider office for new or worsened  signs and symptoms Blood glucose findings outside established parameters, Blood pressure findings outside established parameters, Chest pain, and New or worsened symptom related to anxiety, ckd3 or other chronic conditions  Call care management team with questions or concerns  Verbalize basic understanding of patient centered plan of care established today  Interventions related to HTN, HLD, DMII, CKD Stage 3, Anxiety, and Depression:   Evaluation of current treatment plans and patient's adherence to plan as established by provider. The patient feels he does well at managing his care. His daughter lives with him but he is independent and cares for himself.   Assessed patient understanding of disease states. Patient understands his chronic conditions. 10-13-2019:The patient was evaluated recently in the ED on 09-05-2019 for dizziness.  The patient was concerned about the dizziness and wanted to know if if was some of the medications he may be taking. The patient saw pcp on 10-12-2019 and was taken off of the Buspar and started on Lexapro at 5 mg for 2 to 4 weeks and then increase to 10.  The patient states today he is having a good day and denies any concerns. Appreciative of follow up call.   Assessed patient's education and care coordination needs.  The patient wanted to know what to [place] when he needed refills on his medications. He has lost his wife and son and sometimes he grieves over them. 10-13-2019: Education given to the patient on calling the pcp for worsening s/s of depression or other issues related to his chronic conditions.   Empathetic listening and support as the patient expressed loss of his wife and son and how that impacts him at times. He is effectively managing. Enjoys talking to his sister who is 1 in [place]. The patients daughter lives with him and he is happy that she is there with him. 10-13-2019: Reflective listening as the patient talked about he and his wife being married for 57  years.  He took care of  her at home even after his family wanted him to have her placed in a nursing home. She had Alzheimer's disease and passed when she was 50 and he was 72. The patient states he was thankful he was able to keep her home and take care of her. The patient gets sad sometimes especially around the holidays but he feels like the change in medication will help him a lot. He was just concerned because of the dizziness.    Review of the patients blood sugar and blood pressure readings. The patient verbalized his blood sugars have been a little elevated lately but he has been eating a lot of soup and this am his blood sugar was 153.  The patient admits he has not been taking his blood pressure at home, has the ability to but hasn't. His blood pressure in the ED on 06/24/2019 was 156/70.  Provided disease specific education to patient. Education given on following a heart healthy/ADA diet and the benefits of watching dietary habits for health and well being. Patient is compliant with dietary restricitons.  10-13-2019: The patient states he doesn't have much of an appetite but states he watches what he eats and stays hydrated. Denies any issues with dietary restrictions.   Collaborated with appropriate clinical care team members regarding patient needs. Review of CCM team pharmacist and LCSW. The patient agrees to talk to the LCSW about his anxiety, depression and ways to help with mood. The patient is thankful for the team. Made sure the patient had contact information for the [facility] to call as needed for changes or needs before next outreach.   Patient Self Care Activities related to HTN, HLD, DMII, CKD Stage 3, Anxiety, and Depression:   Patient is unable to independently self-manage chronic health conditions  Please see past updates related to this goal by clicking on the "Past Updates" button in the selected goal          Plan:   Telephone follow up appointment with care management  team member scheduled for: 11-03-2019 at 245pm   [place], MSN, [place] Mobile: [phone]

## 2019-10-13 NOTE — Patient Instructions (Signed)
Visit Information  Goals Addressed              This Visit's Progress     RNCM: pt-"I am feeling better since seeing Dr. Raliegh Ip" (pt-stated)        [place] (see longtitudinal plan of care for additional care plan information)  Current Barriers:   Chronic Disease Management support, education, and care coordination needs related to HTN, HLD, DMII, CKD Stage 3, Anxiety, and Depression  Clinical Goal(s) related to HTN, HLD, DMII, CKD Stage 3, Anxiety, and Depression:  Over the next 120 days, patient will:   Work with the care management team to address educational, disease management, and care coordination needs   Begin or continue self health monitoring activities as directed today Measure and record cbg (blood glucose) 2 times daily, Measure and record blood pressure 2 times per week, and adhere to a heart healthy/ADA diet  Call provider office for new or worsened signs and symptoms Blood glucose findings outside established parameters, Blood pressure findings outside established parameters, Chest pain, and New or worsened symptom related to anxiety, ckd3 or other chronic conditions  Call care management team with questions or concerns  Verbalize basic understanding of patient centered plan of care established today  Interventions related to HTN, HLD, DMII, CKD Stage 3, Anxiety, and Depression:   Evaluation of current treatment plans and patient's adherence to plan as established by provider. The patient feels he does well at managing his care. His daughter lives with him but he is independent and cares for himself.   Assessed patient understanding of disease states. Patient understands his chronic conditions. 10-13-2019:The patient was evaluated recently in the ED on 09-05-2019 for dizziness.  The patient was concerned about the dizziness and wanted to know if if was some of the medications he may be taking. The patient saw pcp on 10-12-2019 and was taken off of the Buspar and started  on Lexapro at 5 mg for 2 to 4 weeks and then increase to 10.  The patient states today he is having a good day and denies any concerns. Appreciative of follow up call.   Assessed patient's education and care coordination needs.  The patient wanted to know what to do when he needed refills on his medications. He has lost his wife and son and sometimes he grieves over them. 10-13-2019: Education given to the patient on calling the pcp for worsening s/s of depression or other issues related to his chronic conditions.   Empathetic listening and support as the patient expressed loss of his wife and son and how that impacts him at times. He is effectively managing. Enjoys talking to his sister who is 34 in [place]. The patients daughter lives with him and he is happy that she is there with him. 10-13-2019: Reflective listening as the patient talked about he and his wife being married for 57 years.  He took care of  her at home even after his family wanted him to have her placed in a nursing home. She had Alzheimer's disease and passed when she was 109 and he was 42. The patient states he was thankful he was able to keep her home and take care of her. The patient gets sad sometimes especially around the holidays but he feels like the change in medication will help him a lot. He was just concerned because of the dizziness.    Review of the patients blood sugar and blood pressure readings. The patient verbalized his blood  sugars have been a little elevated lately but he has been eating a lot of soup and this am his blood sugar was 153.  The patient admits he has not been taking his blood pressure at home, has the ability to but hasn't. His blood pressure in the ED on 06/24/2019 was 156/70.    Provided disease specific education to patient. Education given on following a heart healthy/ADA diet and the benefits of watching dietary habits for health and well being. Patient is compliant with dietary restricitons.  10-13-2019:  The patient states he doesn't have much of an appetite but states he watches what he eats and stays hydrated. Denies any issues with dietary restrictions.   Collaborated with appropriate clinical care team members regarding patient needs. Review of CCM team pharmacist and LCSW. The patient agrees to talk to the LCSW about his anxiety, depression and ways to help with mood. The patient is thankful for the team. Made sure the patient had contact information for the [facility] to call as needed for changes or needs before next outreach.   Patient Self Care Activities related to HTN, HLD, DMII, CKD Stage 3, Anxiety, and Depression:   Patient is unable to independently self-manage chronic health conditions  Please see past updates related to this goal by clicking on the "Past Updates" button in the selected goal         Patient verbalizes understanding of instructions provided today.   Telephone follow up appointment with care management team member scheduled for: 11-03-2019 at 245pm  [place], MSN, [place] Mobile: [phone]

## 2019-10-19 ENCOUNTER — Telehealth: Payer: Self-pay

## 2019-10-19 NOTE — Chronic Care Management (AMB) (Signed)
  Care Management   Note  10/19/2019 Name: Charles York MRN: [id number] DOB: Jun 17, 1930  Charles York is a 86 y.o. year old male who is a primary care patient of [name], DO and is actively engaged with the care management team. I reached out to Yvetta Coder by phone today to assist with scheduling an initial visit with the Licensed Clinical Social Worker  Follow up plan: Unsuccessful telephone outreach attempt made. A HIPAA compliant phone message was left for the patient providing contact information and requesting a return call.  The care management team will reach out to the patient again over the next 7 days.  If patient returns call to provider office, please advise to call [facility] [phone] at [address], [place], [place], [place] [address] Direct Dial: [phone] [email or web address]

## 2019-10-24 NOTE — Progress Notes (Signed)
No. But pt has been scheduled

## 2019-10-24 NOTE — Chronic Care Management (AMB) (Signed)
  Chronic Care Management   Note  10/24/2019 Name: Charles York MRN: [id number] DOB: [birth year]  Charles York is a 86 y.o. year old male who is a primary care patient of [name], DO. Charles York is currently enrolled in care management services. An additional referral for LCSW was placed.   Follow up plan: Telephone appointment with care management team member scheduled for:11/10/2019  Charles York, [address], [place], [place] [address] Direct Dial: [phone] [email or web address]: [email or web address]

## 2019-10-24 NOTE — Progress Notes (Signed)
Do I need to place referral for this pt? I was confused.

## 2019-11-01 ENCOUNTER — Other Ambulatory Visit: Payer: Self-pay

## 2019-11-01 ENCOUNTER — Encounter: Payer: Self-pay | Admitting: Family Medicine

## 2019-11-01 ENCOUNTER — Ambulatory Visit (INDEPENDENT_AMBULATORY_CARE_PROVIDER_SITE_OTHER): Payer: Medicare Other | Admitting: Family Medicine

## 2019-11-01 VITALS — BP 136/51 | HR 66 | Temp 97.5°F | Resp 16 | Ht 67.0 in | Wt 184.6 lb

## 2019-11-01 DIAGNOSIS — F331 Major depressive disorder, recurrent, moderate: Secondary | ICD-10-CM | POA: Diagnosis not present

## 2019-11-01 DIAGNOSIS — M25511 Pain in right shoulder: Secondary | ICD-10-CM

## 2019-11-01 DIAGNOSIS — R110 Nausea: Secondary | ICD-10-CM

## 2019-11-01 DIAGNOSIS — F411 Generalized anxiety disorder: Secondary | ICD-10-CM

## 2019-11-01 DIAGNOSIS — I10 Essential (primary) hypertension: Secondary | ICD-10-CM

## 2019-11-01 DIAGNOSIS — G8929 Other chronic pain: Secondary | ICD-10-CM

## 2019-11-01 NOTE — Assessment & Plan Note (Signed)
Well-controlled HTN - Home BP readings controlled  Complication with CKD-III    Plan:  1. Continue current BP regimen - ARB Losartan 25mg  daily - discussed goal to keep on low dose ARB for renal protection, should not affect his BP significantly or cause side effects 2. Encourage improved lifestyle - low sodium diet, regular exercise 3. Continue monitor BP outside office, bring readings to next visit, if persistently >140/90 or new symptoms notify office sooner. Or if low BP < 100/70 and symptomatic

## 2019-11-01 NOTE — Progress Notes (Addendum)
Subjective:    Patient ID: Charles York, male    DOB: July 09, 1930, 86 y.o.   MRN: [id number]  Charles York is a 86 y.o. male presenting on 11/01/2019 for Shoulder Pain (Right side -----follow up )   HPI   CHRONIC HTN CKD-III DM2 Reports he stopped Losartan 25mg  temporarily and he felt no difference, did not notice any elevated BP he asks about this. Current Meds - Losartan 25mg  daily he has resumed it   Reports good compliance, took meds today. Tolerating well, w/o complaints. Denies CP, dyspnea, HA, edema, dizziness / lightheadedness  Major Depression, recurrent moderate - improved GAD Anxiety Interval update, off Buspar has improved overall less side effects now doing better, has improved appetite and interest Taking Escitalopram low dose  Dizziness / Nausea - RESOLVED Recent visit to ED on 10/06/19, he left without being seen. Last visit with me 10/12/19 - he was discontinued on Buspar due to risk of side effect causing these symptoms. He noticed dramatic improvement and now has RESOLVED dizziness, nausea, and his appetite is much better. He feels good.  Right Shoulder Pain, chronic Osteoarthritis Previous visits reviewed Now no new concerns on shoulder pain Uses tylenol and voltaren PRN Has no symptoms during daytime, improved, has some night-time symptoms can wake up with pain at times.    Depression screen [facility] 2/9 11/01/2019 10/12/2019 09/19/2019  Decreased Interest 1 3 0  Down, Depressed, Hopeless 0 2 0  PHQ - 2 Score 1 5 0  Altered sleeping 1 3 -  Tired, decreased energy 1 2 -  Change in appetite 0 3 -  Feeling bad or failure about yourself  0 0 -  Trouble concentrating 0 0 -  Moving slowly or fidgety/restless 0 0 -  Suicidal thoughts 0 0 -  PHQ-9 Score 3 13 -  Difficult doing work/chores Not difficult at all Somewhat difficult -  Some recent data might be hidden   GAD 7 : Generalized Anxiety Score 11/01/2019 10/12/2019 04/01/2018  Nervous, Anxious, on Edge 1 3 1    Control/stop worrying 1 2 2   Worry too much - different things 1 2 1   Trouble relaxing 0 1 1  Restless 0 1 1  Easily annoyed or irritable 0 3 1  Afraid - awful might happen 0 2 0  Total GAD 7 Score 3 14 7   Anxiety Difficulty Not difficult at all Somewhat difficult Somewhat difficult     Social History   Tobacco Use  . Smoking status: Former Smoker    Types: Cigarettes    Quit date: 02/04/1983    Years since quitting: 36.7  . Smokeless tobacco: Former Network engineer  . Vaping Use: Never used  Substance Use Topics  . Alcohol use: No    Alcohol/week: 0.0 standard drinks  . Drug use: No    Review of Systems Per HPI unless specifically indicated above     Objective:    BP (!) 136/51   Pulse 66   Temp (!) 97.5 F (36.4 C) (Temporal)   Resp 16   Ht 5\' 7"  (1.702 m)   Wt 184 lb 9.6 oz (83.7 kg)   SpO2 99%   BMI 28.91 kg/m   Wt Readings from Last 3 Encounters:  11/01/19 184 lb 9.6 oz (83.7 kg)  10/12/19 181 lb (82.1 kg)  10/05/19 185 lb (83.9 kg)    Physical Exam Vitals and nursing note reviewed.  Constitutional:      General: He is not  in acute distress.    Appearance: He is well-developed. He is not diaphoretic.     Comments: Well-appearing, comfortable, cooperative  HENT:     Head: Normocephalic and atraumatic.  Eyes:     General:        Right eye: No discharge.        Left eye: No discharge.     Conjunctiva/sclera: Conjunctivae normal.  Cardiovascular:     Rate and Rhythm: Normal rate.  Pulmonary:     Effort: Pulmonary effort is normal.  Skin:    General: Skin is warm and dry.     Findings: No erythema or rash.  Neurological:     Mental Status: He is alert and oriented to person, place, and time.  Psychiatric:        Behavior: Behavior normal.     Comments: Well groomed, good eye contact, normal speech and thoughts    Results for orders placed or performed during the hospital encounter of 81/82/99  Basic metabolic panel  Result Value Ref Range    Sodium 138 135 - 145 mmol/L   Potassium 4.5 3.5 - 5.1 mmol/L   Chloride 106 98 - 111 mmol/L   CO2 24 22 - 32 mmol/L   Glucose, Bld 106 (H) 70 - 99 mg/dL   BUN 26 (H) 8 - 23 mg/dL   Creatinine, Ser 1.30 (H) 0.61 - 1.24 mg/dL   Calcium 8.7 (L) 8.9 - 10.3 mg/dL   GFR calc non Af Amer 49 (L) >60 mL/min   GFR calc Af Amer 57 (L) >60 mL/min   Anion gap 8 5 - 15  CBC  Result Value Ref Range   WBC 10.6 (H) 4.0 - 10.5 K/uL   RBC 3.67 (L) 4.22 - 5.81 MIL/uL   Hemoglobin 12.0 (L) 13.0 - 17.0 g/dL   HCT 35.9 (L) 39 - 52 %   MCV 97.8 80.0 - 100.0 fL   MCH 32.7 26.0 - 34.0 pg   MCHC 33.4 30.0 - 36.0 g/dL   RDW 12.6 11.5 - 15.5 %   Platelets 177 150 - 400 K/uL   nRBC 0.0 0.0 - 0.2 %  Troponin I (High Sensitivity)  Result Value Ref Range   Troponin I (High Sensitivity) 7 <18 ng/L      Assessment & Plan:   Problem List Items Addressed This Visit    Major depressive disorder, recurrent, moderate (HCC)    Improved major depression, moderate recurrent See improved PHQ GAD scores On low dose SSRI Escitalopram Off buspar, mood improved      GAD (generalized anxiety disorder)   Essential hypertension - Primary    Well-controlled HTN - Home BP readings controlled  Complication with CKD-III    Plan:  1. Continue current BP regimen - ARB Losartan 25mg  daily - discussed goal to keep on low dose ARB for renal protection, should not affect his BP significantly or cause side effects 2. Encourage improved lifestyle - low sodium diet, regular exercise 3. Continue monitor BP outside office, bring readings to next visit, if persistently >140/90 or new symptoms notify office sooner. Or if low BP < 100/70 and symptomatic      Chronic right shoulder pain    Stable chronic shoulder pain with known chronic bursitis and OA/DJD S/p prior subacromial injection x 2, last 06/2018 Reassurance f/u if worsening  Advised to use extra pillow / cushion support when sleeping, seems only night-time  sleeping/awakening pain from shoulders       Other Visit  Diagnoses    Nausea         Nausea/Dizzines - RESOLVED Medication side effect from Buspar Now resolved off med His mood is improved as well.    No orders of the defined types were placed in this encounter.     Follow up plan: Return if symptoms worsen or fail to improve, for keep apt as scheduled.   Nobie Putnam, [facility] 11/01/2019, 8:52 AM

## 2019-11-01 NOTE — Assessment & Plan Note (Signed)
Improved major depression, moderate recurrent See improved PHQ GAD scores On low dose SSRI Escitalopram Off buspar, mood improved

## 2019-11-01 NOTE — Assessment & Plan Note (Signed)
Stable chronic shoulder pain with known chronic bursitis and OA/DJD S/p prior subacromial injection x 2, last 06/2018 Reassurance f/u if worsening  Advised to use extra pillow / cushion support when sleeping, seems only night-time sleeping/awakening pain from shoulders

## 2019-11-01 NOTE — Addendum Note (Signed)
Addended by: [name] on: 11/01/2019 01:11 PM   Modules accepted: Level of Service

## 2019-11-01 NOTE — Patient Instructions (Addendum)
Thank you for coming to the office today.  Recommend to start taking Tylenol Extra Strength 500mg  tabs - take 1 to 2 tabs per dose (max 1000mg ) every 6-8 hours for pain (take regularly, don't skip a dose for next 7 days), max 24 hour daily dose is 6 tablets or 3000mg . In the future you can repeat the same everyday Tylenol course for 1-2 weeks at a time.  Keep on Losartan 25mg  daily - this is to protect your kidneys, it is working in background, not strong enough to lower your blood pressure.  Try extra pillow support for shoulder overnight.  Remain off Buspar   Please schedule a Follow-up Appointment to: Return if symptoms worsen or fail to improve, for keep apt as scheduled.  If you have any other questions or concerns, please feel free to call the office or send a message through [place]. You may also schedule an earlier appointment if necessary.  Additionally, you may be receiving a survey about your experience at our office within a few days to 1 week by e-mail or mail. We value your feedback.  Nobie Putnam, DO [place]

## 2019-11-03 ENCOUNTER — Telehealth: Payer: Self-pay | Admitting: General Practice

## 2019-11-03 ENCOUNTER — Telehealth: Payer: Medicare Other

## 2019-11-03 ENCOUNTER — Ambulatory Visit: Payer: Self-pay | Admitting: General Practice

## 2019-11-03 DIAGNOSIS — E1165 Type 2 diabetes mellitus with hyperglycemia: Secondary | ICD-10-CM

## 2019-11-03 DIAGNOSIS — N1831 Chronic kidney disease, stage 3a: Secondary | ICD-10-CM | POA: Diagnosis not present

## 2019-11-03 DIAGNOSIS — E785 Hyperlipidemia, unspecified: Secondary | ICD-10-CM | POA: Diagnosis not present

## 2019-11-03 DIAGNOSIS — F331 Major depressive disorder, recurrent, moderate: Secondary | ICD-10-CM | POA: Diagnosis not present

## 2019-11-03 DIAGNOSIS — I10 Essential (primary) hypertension: Secondary | ICD-10-CM

## 2019-11-03 DIAGNOSIS — F419 Anxiety disorder, unspecified: Secondary | ICD-10-CM

## 2019-11-03 DIAGNOSIS — E1169 Type 2 diabetes mellitus with other specified complication: Secondary | ICD-10-CM

## 2019-11-03 NOTE — Telephone Encounter (Signed)
  Chronic Care Management   Note  11/03/2019 Name: ALONTE WULFF MRN: [id number] DOB: [birth year]  The patient had gone outside to go get the mail and could not talk at the time. The RNCM made a call back to the patient and completed the call.   Follow up plan: Face to Face appointment with care management team member scheduled for:  01-16-2020 at 0830 am  Noreene Larsson RN, MSN, [place] Mobile: [phone]

## 2019-11-03 NOTE — Chronic Care Management (AMB) (Signed)
Chronic Care Management   Follow Up Note   11/03/2019 Name: ABRAHIM SARGENT MRN: [id number] DOB: 1930/04/05  Referred by: [name], DO Reason for referral : Chronic Care Management (RNCM Chronic Disease Management and Care Coordination Needs)   BRAILYN DELMAN is a 86 y.o. year old male who is a primary care patient of [name], DO. The CCM team was consulted for assistance with chronic disease management and care coordination needs.    Review of patient status, including review of consultants reports, relevant laboratory and other test results, and collaboration with appropriate care team members and the patient's provider was performed as part of comprehensive patient evaluation and provision of chronic care management services.    SDOH (Social Determinants of Health) assessments performed: Yes See Care Plan activities for detailed interventions related to [facility])     Outpatient Encounter Medications as of 11/03/2019  Medication Sig Note   aspirin 325 MG EC tablet TK 1 T PO QD FOR UP TO 90 DAYS THEN CAN REDUCE TO 81 MG DAILY    atorvastatin (LIPITOR) 40 MG tablet Take 1 tablet (40 mg total) by mouth at bedtime.    escitalopram (LEXAPRO) 10 MG tablet Take 1 tablet (10 mg total) by mouth daily. Start with half pill (5mg  dose) once daily, increase to 10mg  after 2-4 weeks.    ferrous sulfate 325 (65 FE) MG tablet Take 325 mg by mouth every other day.    HUMALOG 100 UNIT/ML injection ADMINISTER 12 UNITS UNDER THE SKIN TWICE DAILY WITH A MEAL    HUMALOG KWIKPEN 100 UNIT/ML KwikPen Inject 0.12 mLs (12 Units total) into the skin 2 (two) times daily with a meal. Up to 50 units/day as directed by MD    hydroxypropyl methylcellulose / hypromellose (ISOPTO TEARS / GONIOVISC) 2.5 % ophthalmic solution Place 1 drop into both eyes 4 (four) times daily.    insulin aspart protamine - aspart (NOVOLOG 70/30 MIX) (70-30) 100 UNIT/ML FlexPen Inject 12 Units into the skin 2  (two) times daily. In the morning and evening    ipratropium (ATROVENT) 0.06 % nasal spray USE 2 SPRAYS IN EACH NOSTRIL FOUR TIMES DAILY FOR UP TO 5 TO 7 DAYS THEN STOP 12/29/2018: Dropped to twice daily due to excessive dryness.   LANTUS 100 UNIT/ML injection INJECT 27 UNITS SUBCUTANEOUSLY BEFORE BREAKFAST    losartan (COZAAR) 25 MG tablet Take 1 tablet (25 mg total) by mouth daily.    omeprazole (PRILOSEC) 20 MG capsule Take 1 capsule (20 mg total) by mouth 2 (two) times daily before a meal.    polyethylene glycol powder (GLYCOLAX/MIRALAX) 17 GM/SCOOP powder Take 17-34 g by mouth 2 (two) times daily as needed for mild constipation or moderate constipation.    sucralfate (CARAFATE) 1 g tablet Take 1 tablet (1 g total) by mouth 4 (four) times daily.    vitamin B-12 (CYANOCOBALAMIN) 1000 MCG tablet Take 2,000 mcg by mouth daily.     No facility-administered encounter medications on file as of 11/03/2019.     Objective:  BP Readings from Last 3 Encounters:  11/01/19 (!) 136/51  10/12/19 (!) 106/56  10/05/19 (!) 156/75    Goals Addressed              This Visit's Progress     RNCM: pt-"I am feeling better since seeing Dr. Raliegh Ip" (pt-stated)        [place] (see longtitudinal plan of care for additional care plan information)  Current Barriers:  Chronic Disease Management support, education, and care coordination needs related to HTN, HLD, DMII, CKD Stage 3, Anxiety, and Depression  Clinical Goal(s) related to HTN, HLD, DMII, CKD Stage 3, Anxiety, and Depression:  Over the next 120 days, patient will:   Work with the care management team to address educational, disease management, and care coordination needs   Begin or continue self health monitoring activities as directed today Measure and record cbg (blood glucose) 2 times daily, Measure and record blood pressure 2 times per week, and adhere to a heart healthy/ADA diet  Call provider office for new or worsened signs  and symptoms Blood glucose findings outside established parameters, Blood pressure findings outside established parameters, Chest pain, and New or worsened symptom related to anxiety, ckd3 or other chronic conditions  Call care management team with questions or concerns  Verbalize basic understanding of patient centered plan of care established today  Interventions related to HTN, HLD, DMII, CKD Stage 3, Anxiety, and Depression:   Evaluation of current treatment plans and patient's adherence to plan as established by provider. The patient feels he does well at managing his care. His daughter lives with him but he is independent and cares for himself.   Assessed patient understanding of disease states. Patient understands his chronic conditions. 10-13-2019:The patient was evaluated recently in the ED on 09-05-2019 for dizziness.  The patient was concerned about the dizziness and wanted to know if if was some of the medications he may be taking. The patient saw pcp on 10-12-2019 and was taken off of the Buspar and started on Lexapro at 5 mg for 2 to 4 weeks and then increase to 10.  The patient states today he is having a good day and denies any concerns. Appreciative of follow up call. 11-03-2019: The patient is doing well. Saw pcp this week. Is feeling much better since being off of Buspar. Denies any new issues with chronic conditions.   Assessed patient's education and care coordination needs.  The patient wanted to know what to do when he needed refills on his medications. He has lost his wife and son and sometimes he grieves over them. 11-03-2019: Education given to the patient on calling the pcp for worsening s/s of depression or other issues related to his chronic conditions.   Empathetic listening and support as the patient expressed loss of his wife and son and how that impacts him at times. He is effectively managing. Enjoys talking to his sister who is 85 in [place]. The patients daughter lives with  him and he is happy that she is there with him. 11-03-2019: Reflective listening as the patient talked about he and his wife being married for 57 years.  He took care of  her at home even after his family wanted him to have her placed in a nursing home. She had Alzheimer's disease and passed when she was 33 and he was 34. The patient states he was thankful he was able to keep her home and take care of her. The patient gets sad sometimes especially around the holidays but he feels like the change in medication will help him a lot. He was just concerned because of the dizziness.    Review of the patients blood sugar and blood pressure readings. The patient verbalized his blood sugars have been a little elevated lately but he has been eating a lot of soup and this am his blood sugar was 153.  The patient admits he has not been taking  his blood pressure at home, has the ability to but hasn't. His blood pressure in the ED on 06/24/2019 was 156/70.    Provided disease specific education to patient. Education given on following a heart healthy/ADA diet and the benefits of watching dietary habits for health and well being. Patient is compliant with dietary restricitons.  11-03-2019: The patient states he doesn't have much of an appetite but states he watches what he eats and stays hydrated. Denies any issues with dietary restrictions.   Collaborated with appropriate clinical care team members regarding patient needs. Review of CCM team pharmacist and LCSW. The patient agrees to talk to the LCSW about his anxiety, depression and ways to help with mood. The patient is thankful for the team. Made sure the patient had contact information for the [facility] to call as needed for changes or needs before next outreach.   Patient Self Care Activities related to HTN, HLD, DMII, CKD Stage 3, Anxiety, and Depression:   Patient is unable to independently self-manage chronic health conditions  Please see past updates related to this  goal by clicking on the "Past Updates" button in the selected goal          Plan:   Telephone follow up appointment with care management team member scheduled for: 12-26-2019 at 11:45 am   [place], MSN, [place] Mobile: [phone]

## 2019-11-03 NOTE — Patient Instructions (Signed)
Visit Information  Goals Addressed              This Visit's Progress     RNCM: pt-"I am feeling better since seeing Dr. Raliegh Ip" (pt-stated)        [place] (see longtitudinal plan of care for additional care plan information)  Current Barriers:   Chronic Disease Management support, education, and care coordination needs related to HTN, HLD, DMII, CKD Stage 3, Anxiety, and Depression  Clinical Goal(s) related to HTN, HLD, DMII, CKD Stage 3, Anxiety, and Depression:  Over the next 120 days, patient will:   Work with the care management team to address educational, disease management, and care coordination needs   Begin or continue self health monitoring activities as directed today Measure and record cbg (blood glucose) 2 times daily, Measure and record blood pressure 2 times per week, and adhere to a heart healthy/ADA diet  Call provider office for new or worsened signs and symptoms Blood glucose findings outside established parameters, Blood pressure findings outside established parameters, Chest pain, and New or worsened symptom related to anxiety, ckd3 or other chronic conditions  Call care management team with questions or concerns  Verbalize basic understanding of patient centered plan of care established today  Interventions related to HTN, HLD, DMII, CKD Stage 3, Anxiety, and Depression:   Evaluation of current treatment plans and patient's adherence to plan as established by provider. The patient feels he does well at managing his care. His daughter lives with him but he is independent and cares for himself.   Assessed patient understanding of disease states. Patient understands his chronic conditions. 10-13-2019:The patient was evaluated recently in the ED on 09-05-2019 for dizziness.  The patient was concerned about the dizziness and wanted to know if if was some of the medications he may be taking. The patient saw pcp on 10-12-2019 and was taken off of the Buspar and started  on Lexapro at 5 mg for 2 to 4 weeks and then increase to 10.  The patient states today he is having a good day and denies any concerns. Appreciative of follow up call. 11-03-2019: The patient is doing well. Saw pcp this week. Is feeling much better since being off of Buspar. Denies any new issues with chronic conditions.   Assessed patient's education and care coordination needs.  The patient wanted to know what to do when he needed refills on his medications. He has lost his wife and son and sometimes he grieves over them. 11-03-2019: Education given to the patient on calling the pcp for worsening s/s of depression or other issues related to his chronic conditions.   Empathetic listening and support as the patient expressed loss of his wife and son and how that impacts him at times. He is effectively managing. Enjoys talking to his sister who is 68 in [place]. The patients daughter lives with him and he is happy that she is there with him. 11-03-2019: Reflective listening as the patient talked about he and his wife being married for 57 years.  He took care of  her at home even after his family wanted him to have her placed in a nursing home. She had Alzheimer's disease and passed when she was 27 and he was 57. The patient states he was thankful he was able to keep her home and take care of her. The patient gets sad sometimes especially around the holidays but he feels like the change in medication will help him a lot.  He was just concerned because of the dizziness.    Review of the patients blood sugar and blood pressure readings. The patient verbalized his blood sugars have been a little elevated lately but he has been eating a lot of soup and this am his blood sugar was 153.  The patient admits he has not been taking his blood pressure at home, has the ability to but hasn't. His blood pressure in the ED on 06/24/2019 was 156/70.    Provided disease specific education to patient. Education given on following a  heart healthy/ADA diet and the benefits of watching dietary habits for health and well being. Patient is compliant with dietary restricitons.  11-03-2019: The patient states he doesn't have much of an appetite but states he watches what he eats and stays hydrated. Denies any issues with dietary restrictions.   Collaborated with appropriate clinical care team members regarding patient needs. Review of CCM team pharmacist and LCSW. The patient agrees to talk to the LCSW about his anxiety, depression and ways to help with mood. The patient is thankful for the team. Made sure the patient had contact information for the [facility] to call as needed for changes or needs before next outreach.   Patient Self Care Activities related to HTN, HLD, DMII, CKD Stage 3, Anxiety, and Depression:   Patient is unable to independently self-manage chronic health conditions  Please see past updates related to this goal by clicking on the "Past Updates" button in the selected goal         Patient verbalizes understanding of instructions provided today.   Telephone follow up appointment with care management team member scheduled for: 12-26-2019 at 11:45 am  Noreene Larsson RN, MSN, [place] Mobile: [phone]

## 2019-11-10 ENCOUNTER — Ambulatory Visit (INDEPENDENT_AMBULATORY_CARE_PROVIDER_SITE_OTHER): Payer: Medicare Other | Admitting: Licensed Clinical Social Worker

## 2019-11-10 DIAGNOSIS — E1165 Type 2 diabetes mellitus with hyperglycemia: Secondary | ICD-10-CM | POA: Diagnosis not present

## 2019-11-10 DIAGNOSIS — E785 Hyperlipidemia, unspecified: Secondary | ICD-10-CM

## 2019-11-10 DIAGNOSIS — E1169 Type 2 diabetes mellitus with other specified complication: Secondary | ICD-10-CM

## 2019-11-10 DIAGNOSIS — N1831 Chronic kidney disease, stage 3a: Secondary | ICD-10-CM | POA: Diagnosis not present

## 2019-11-10 DIAGNOSIS — I10 Essential (primary) hypertension: Secondary | ICD-10-CM

## 2019-11-10 DIAGNOSIS — Z794 Long term (current) use of insulin: Secondary | ICD-10-CM

## 2019-11-10 DIAGNOSIS — F331 Major depressive disorder, recurrent, moderate: Secondary | ICD-10-CM
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
## 2019-12-10 ENCOUNTER — Other Ambulatory Visit: Payer: Self-pay | Admitting: Family Medicine

## 2019-12-10 DIAGNOSIS — I10 Essential (primary) hypertension: Secondary | ICD-10-CM

## 2019-12-10 DIAGNOSIS — F4322 Adjustment disorder with anxiety: Secondary | ICD-10-CM

## 2019-12-10 NOTE — Telephone Encounter (Signed)
Requested Prescriptions  Pending Prescriptions Disp Refills  . losartan (COZAAR) 25 MG tablet [Pharmacy Med Name: LOSARTAN 25MG  TABLETS] 90 tablet 3    Sig: TAKE 1 TABLET(25 MG) BY MOUTH DAILY     Cardiovascular:  Angiotensin Receptor Blockers Failed - 12/10/2019  7:06 AM      Failed - Cr in normal range and within 180 days    Creat  Date Value Ref Range Status  09/12/2019 1.40 (H) 0.70 - 1.11 mg/dL Final    Comment:    For patients >84 years of age, the reference limit for Creatinine is approximately 13% higher for people identified as African-American. .    Creatinine, Ser  Date Value Ref Range Status  10/05/2019 1.30 (H) 0.61 - 1.24 mg/dL Final         Passed - K in normal range and within 180 days    Potassium  Date Value Ref Range Status  10/05/2019 4.5 3.5 - 5.1 mmol/L Final         Passed - Patient is not pregnant      Passed - Last BP in normal range    BP Readings from Last 1 Encounters:  11/01/19 (!) 136/51         Passed - Valid encounter within last 6 months    Recent Outpatient Visits          1 month ago Essential hypertension   [place], DO   1 month ago Major depressive disorder, recurrent, moderate ([place])   [facility], Devonne Doughty, DO   2 months ago Diabetic retinopathy of both eyes without macular edema associated with type 2 diabetes mellitus, unspecified retinopathy severity ([place])   [facility], Devonne Doughty, DO   5 months ago Strain of left pectoralis muscle, initial encounter   [place], Devonne Doughty, DO   8 months ago Constipation, unspecified constipation type   [place], DO      Future Appointments            In 2 days [place], Devonne Doughty, DO [facility], [place]   In 1 month [place] [facility], Devonne Doughty, DO [facility] [place], [facility]            . busPIRone (BUSPAR) 5 MG tablet [Pharmacy Med Name: BUSPIRONE 5MG  TABLETS] 180 tablet     Sig: TAKE 1 TABLET(5 MG) BY MOUTH TWICE DAILY AS NEEDED     Psychiatry: Anxiolytics/Hypnotics - Non-controlled Passed - 12/10/2019  7:06 AM      Passed - Valid encounter within last 6 months    Recent Outpatient Visits          1 month ago Essential hypertension   [place], DO   1 month ago Major depressive disorder, recurrent, moderate ([place])   [facility], Devonne Doughty, DO   2 months ago Diabetic retinopathy of both eyes without macular edema associated with type 2 diabetes mellitus, unspecified retinopathy severity [facility])   [place], DO   5 months ago Strain of left pectoralis muscle, initial encounter   [place], DO   8 months ago Constipation, unspecified constipation type   [place], Devonne Doughty, DO      Future Appointments  In 2 days [facility], Devonne Doughty, DO [facility], [place]   In 1 month [place] [facility], [facility], [facility]

## 2019-12-12 ENCOUNTER — Ambulatory Visit: Payer: Medicare Other | Admitting: Family Medicine

## 2019-12-16 ENCOUNTER — Other Ambulatory Visit: Payer: Self-pay | Admitting: Family Medicine

## 2019-12-16 DIAGNOSIS — R110 Nausea: Secondary | ICD-10-CM

## 2019-12-16 DIAGNOSIS — K219 Gastro-esophageal reflux disease without esophagitis: Secondary | ICD-10-CM

## 2019-12-17 NOTE — Telephone Encounter (Signed)
Requested Prescriptions  Pending Prescriptions Disp Refills  . omeprazole (PRILOSEC) 20 MG capsule [Pharmacy Med Name: OMEPRAZOLE 20MG  CAPSULES] 180 capsule 1    Sig: TAKE 1 CAPSULE(20 MG) BY MOUTH TWICE DAILY BEFORE A MEAL     Gastroenterology: Proton Pump Inhibitors Passed - 12/16/2019  5:13 PM      Passed - Valid encounter within last 12 months    Recent Outpatient Visits          1 month ago Essential hypertension   [place], DO   2 months ago Major depressive disorder, recurrent, moderate ([place])   [facility], Devonne Doughty, DO   2 months ago Diabetic retinopathy of both eyes without macular edema associated with type 2 diabetes mellitus, unspecified retinopathy severity [facility])   [place], DO   5 months ago Strain of left pectoralis muscle, initial encounter   [place], DO   8 months ago Constipation, unspecified constipation type   [facility], Devonne Doughty, DO      Future Appointments            In 1 month [facility], Devonne Doughty, [facility], [facility]

## 2019-12-26 ENCOUNTER — Telehealth: Payer: Medicare Other

## 2020-01-06 DIAGNOSIS — H905 Unspecified sensorineural hearing loss: Secondary | ICD-10-CM | POA: Diagnosis not present

## 2020-01-16 ENCOUNTER — Ambulatory Visit (INDEPENDENT_AMBULATORY_CARE_PROVIDER_SITE_OTHER): Payer: Medicare Other | Admitting: Family Medicine

## 2020-01-16 ENCOUNTER — Ambulatory Visit: Payer: Medicare Other | Admitting: General Practice

## 2020-01-16 ENCOUNTER — Other Ambulatory Visit: Payer: Self-pay

## 2020-01-16 ENCOUNTER — Ambulatory Visit: Payer: Self-pay | Admitting: General Practice

## 2020-01-16 ENCOUNTER — Encounter: Payer: Self-pay | Admitting: Family Medicine

## 2020-01-16 VITALS — BP 139/62 | HR 89 | Temp 97.3°F | Resp 16 | Ht 67.0 in | Wt 187.0 lb

## 2020-01-16 DIAGNOSIS — G8929 Other chronic pain: Secondary | ICD-10-CM

## 2020-01-16 DIAGNOSIS — F411 Generalized anxiety disorder: Secondary | ICD-10-CM

## 2020-01-16 DIAGNOSIS — N1831 Chronic kidney disease, stage 3a: Secondary | ICD-10-CM

## 2020-01-16 DIAGNOSIS — F331 Major depressive disorder, recurrent, moderate: Secondary | ICD-10-CM

## 2020-01-16 DIAGNOSIS — E1165 Type 2 diabetes mellitus with hyperglycemia: Secondary | ICD-10-CM

## 2020-01-16 DIAGNOSIS — Z794 Long term (current) use of insulin: Secondary | ICD-10-CM

## 2020-01-16 DIAGNOSIS — M25511 Pain in right shoulder: Secondary | ICD-10-CM

## 2020-01-16 DIAGNOSIS — E1169 Type 2 diabetes mellitus with other specified complication: Secondary | ICD-10-CM

## 2020-01-16 DIAGNOSIS — E785 Hyperlipidemia, unspecified: Secondary | ICD-10-CM

## 2020-01-16 DIAGNOSIS — F419 Anxiety disorder, unspecified: Secondary | ICD-10-CM

## 2020-01-16 DIAGNOSIS — I10 Essential (primary) hypertension: Secondary | ICD-10-CM

## 2020-01-16 MED ORDER — GABAPENTIN 100 MG PO CAPS: ORAL_CAPSULE | ORAL | 1 refills | Status: DC

## 2020-01-16 NOTE — Patient Instructions (Addendum)
Thank you for coming to the office today.  Start Gabapentin 100mg  capsules, take at night for 2-3 nights only, and then increase to 2 times a day for a few days, and then may increase to 3 times a day, it may make you drowsy, if helps significantly at night only, then you can increase instead to 3 capsules at night, instead of 3 times a day - In the future if needed, we can significantly increase the dose if tolerated well, some common doses are 300mg  three times a day up to 600mg  three times a day, usually it takes several weeks or months to get to higher doses  Goal to increase dose to 300 to 400 - then we can switch this to ONE PILL AT NIGHT ONLY  Caution if you get dizzy let me know we can stop this med.  -----   Please schedule a Follow-up Appointment to: Return in about 4 months (around 05/16/2020) for 4 month follow-up DM A1c, Chronic R Shoulder pain, anxiety, mood PHQ GAD.  If you have any other questions or concerns, please feel free to call the office or send a message through [place]. You may also schedule an earlier appointment if necessary.  Additionally, you may be receiving a survey about your experience at our office within a few days to 1 week by e-mail or mail. We value your feedback.  Nobie Putnam, DO [place]

## 2020-01-16 NOTE — Patient Instructions (Addendum)
Visit Information  Goals Addressed              This Visit's Progress   .  RNCM: pt-"I am feeling better since seeing Dr. Raliegh Ip" (pt-stated)        [place] (see longtitudinal plan of care for additional care plan information)  Current Barriers:  . Chronic Disease Management support, education, and care coordination needs related to HTN, HLD, DMII, CKD Stage 3, Anxiety, and Depression  Clinical Goal(s) related to HTN, HLD, DMII, CKD Stage 3, Anxiety, and Depression:  Over the next 120 days, patient will:  . Work with the care management team to address educational, disease management, and care coordination needs  . Begin or continue self health monitoring activities as directed today Measure and record cbg (blood glucose) 2 times daily, Measure and record blood pressure 2 times per week, and adhere to a heart healthy/ADA diet . Call provider office for new or worsened signs and symptoms Blood glucose findings outside established parameters, Blood pressure findings outside established parameters, Chest pain, and New or worsened symptom related to anxiety, ckd3 or other chronic conditions . Call care management team with questions or concerns . Verbalize basic understanding of patient centered plan of care established today  Interventions related to HTN, HLD, DMII, CKD Stage 3, Anxiety, and Depression:  . Evaluation of current treatment plans and patient's adherence to plan as established by provider. The patient feels he does well at managing his care. His daughter lives with him but he is independent and cares for himself. 01-16-2020: The patient says he is doing well and only has a little problem with shoulder pain and discomfort at times. He says it mainly bothers him at night. Pcp has prescribed pain medications to help at night.   . Assessed patient understanding of disease states. Patient understands his chronic conditions. 01-16-2020: The patient has decided he is doing well and  wants to come off of his lexapro.  The patient will let the pcp know if there are changes. . Assessed patient's education and care coordination needs.  The patient wanted to know what to do when he needed refills on his medications. He has lost his wife and son and sometimes he grieves over them. 11-03-2019: Education given to the patient on calling the pcp for worsening s/s of depression or other issues related to his chronic conditions. 01-16-2020: The patient denies any new educational or care coordination needs.  Has RNCM information for changes in condition or new concerns. . Empathetic listening and support as the patient expressed loss of his wife and son and how that impacts him at times. He is effectively managing. Enjoys talking to his sister who is 45 in [place]. The patients daughter lives with him and he is happy that she is there with him. 11-03-2019: Reflective listening as the patient talked about he and his wife being married for 57 years.  He took care of  her at home even after his family wanted him to have her placed in a nursing home. She had Alzheimer's disease and passed when she was 42 and he was 13. The patient states he was thankful he was able to keep her home and take care of her. The patient gets sad sometimes especially around the holidays but he feels like the change in medication will help him a lot. He was just concerned because of the dizziness.  01-16-2020: The patient states he is at a good place.  He has good  support from his daughter and friends.  . Review of the patients blood sugar and blood pressure readings. The patient verbalized his blood sugars have been a little elevated lately but he has been eating a lot of soup and this am his blood sugar was 153.  The patient admits he has not been taking his blood pressure at home, has the ability to but hasn't. His blood pressure in the ED on 06/24/2019 was 156/70.  01-16-2020: VS stable and the patient endorses good blood sugar  readings. . Provided disease specific education to patient. Education given on following a heart healthy/ADA diet and the benefits of watching dietary habits for health and well being. Patient is compliant with dietary restricitons. 01-16-2020: The patient is eating better. Maintaining weight. Nash Dimmer with appropriate clinical care team members regarding patient needs. Review of CCM team pharmacist and LCSW. The patient agrees to talk to the LCSW about his anxiety, depression and ways to help with mood. The patient is thankful for the team. Made sure the patient had contact information for the [facility] to call as needed for changes or needs before next outreach. 01-16-2020:Has LCSW support.  Patient Self Care Activities related to HTN, HLD, DMII, CKD Stage 3, Anxiety, and Depression:  . Patient is unable to independently self-manage chronic health conditions  Please see past updates related to this goal by clicking on the "Past Updates" button in the selected goal         The patient verbalized understanding of instructions, educational materials, and care plan provided today and declined offer to receive copy of patient instructions, educational materials, and care plan.   Telephone follow up appointment with care management team member scheduled for:03-12-2020 at 55 am  [place], MSN, [place] Mobile: [phone]

## 2020-01-16 NOTE — Progress Notes (Signed)
Subjective:    Patient ID: Charles York, male    DOB: 1930/02/11, 86 y.o.   MRN: [id number]  Charles York is a 86 y.o. male presenting on 01/16/2020 for Anxiety and Shoulder Pain (Right side onset month)   HPI   Major Depression, recurrent moderate - improved GAD Anxiety Interval update, off Buspar has improved overall less side effects now doing better, has improved appetite and interest. Also he has self discontinued Lexapro since last visit. He feels like his mood and anxiety are very stable and improved overall, he prefers to avoid medication. See PHQ GAD  Right Shoulder Pain, chronic Osteoarthritis Previous visits reviewed. Reports new injury 3-4 weeks ago he was doing laundry and whipped the clothing to dry it and injured R shoulder, he had chronic pain previously Uses tylenol and voltaren PRN Has no symptoms during daytime, improved, has some night-time symptoms can wake up with pain at times. Admits some left pectoral muscle pain    Depression screen [facility] 2/9 01/16/2020 11/01/2019 10/12/2019  Decreased Interest 1 1 3   Down, Depressed, Hopeless 0 0 2  PHQ - 2 Score 1 1 5   Altered sleeping 0 1 3  Tired, decreased energy 1 1 2   Change in appetite 0 0 3  Feeling bad or failure about yourself  0 0 0  Trouble concentrating 0 0 0  Moving slowly or fidgety/restless 0 0 0  Suicidal thoughts 0 0 0  PHQ-9 Score 2 3 13   Difficult doing work/chores Not difficult at all Not difficult at all Somewhat difficult  Some recent data might be hidden   GAD 7 : Generalized Anxiety Score 01/16/2020 11/01/2019 10/12/2019 04/01/2018  Nervous, Anxious, on Edge 1 1 3 1   Control/stop worrying 1 1 2 2   Worry too much - different things 1 1 2 1   Trouble relaxing 0 0 1 1  Restless 0 0 1 1  Easily annoyed or irritable 1 0 3 1  Afraid - awful might happen 1 0 2 0  Total GAD 7 Score 5 3 14 7   Anxiety Difficulty Not difficult at all Not difficult at all Somewhat difficult Somewhat difficult      Social History   Tobacco Use  . Smoking status: Former Smoker    Types: Cigarettes    Quit date: 02/04/1983    Years since quitting: 36.9  . Smokeless tobacco: Former Network engineer  . Vaping Use: Never used  Substance Use Topics  . Alcohol use: No    Alcohol/week: 0.0 standard drinks  . Drug use: No    Review of Systems Per HPI unless specifically indicated above     Objective:    BP 139/62   Pulse 89   Temp (!) 97.3 F (36.3 C) (Temporal)   Resp 16   Ht 5\' 7"  (1.702 m)   Wt 187 lb (84.8 kg)   SpO2 97%   BMI 29.29 kg/m   Wt Readings from Last 3 Encounters:  01/16/20 187 lb (84.8 kg)  11/01/19 184 lb 9.6 oz (83.7 kg)  10/12/19 181 lb (82.1 kg)    Physical Exam Vitals and nursing note reviewed.  Constitutional:      General: He is not in acute distress.    Appearance: He is well-developed and well-nourished. He is not diaphoretic.     Comments: Well-appearing, comfortable, cooperative  HENT:     Head: Normocephalic and atraumatic.     Mouth/Throat:     Mouth: Oropharynx is clear  and moist.  Eyes:     General:        Right eye: No discharge.        Left eye: No discharge.     Conjunctiva/sclera: Conjunctivae normal.  Cardiovascular:     Rate and Rhythm: Normal rate.  Pulmonary:     Effort: Pulmonary effort is normal.  Musculoskeletal:        General: No edema.     Comments: R shoulder mild limited range of motion abduction flexion. Muscle spasm tenderness posterior shoulder only Mild impingement positive testing Negative rotator cuff weakness  Reproducible tenderness L chest pectoral vs axillary region  Skin:    General: Skin is warm and dry.     Findings: No erythema or rash.  Neurological:     Mental Status: He is alert and oriented to person, place, and time.  Psychiatric:        Mood and Affect: Mood and affect normal.        Behavior: Behavior normal.     Comments: Well groomed, good eye contact, normal speech and thoughts     Results for orders placed or performed during the hospital encounter of 45/80/99  Basic metabolic panel  Result Value Ref Range   Sodium 138 135 - 145 mmol/L   Potassium 4.5 3.5 - 5.1 mmol/L   Chloride 106 98 - 111 mmol/L   CO2 24 22 - 32 mmol/L   Glucose, Bld 106 (H) 70 - 99 mg/dL   BUN 26 (H) 8 - 23 mg/dL   Creatinine, Ser 1.30 (H) 0.61 - 1.24 mg/dL   Calcium 8.7 (L) 8.9 - 10.3 mg/dL   GFR calc non Af Amer 49 (L) >60 mL/min   GFR calc Af Amer 57 (L) >60 mL/min   Anion gap 8 5 - 15  CBC  Result Value Ref Range   WBC 10.6 (H) 4.0 - 10.5 K/uL   RBC 3.67 (L) 4.22 - 5.81 MIL/uL   Hemoglobin 12.0 (L) 13.0 - 17.0 g/dL   HCT 35.9 (L) 39.0 - 52.0 %   MCV 97.8 80.0 - 100.0 fL   MCH 32.7 26.0 - 34.0 pg   MCHC 33.4 30.0 - 36.0 g/dL   RDW 12.6 11.5 - 15.5 %   Platelets 177 150 - 400 K/uL   nRBC 0.0 0.0 - 0.2 %  Troponin I (High Sensitivity)  Result Value Ref Range   Troponin I (High Sensitivity) 7 <18 ng/L      Assessment & Plan:   Problem List Items Addressed This Visit    Major depressive disorder, recurrent, moderate (HCC) - Primary   GAD (generalized anxiety disorder)   Chronic right shoulder pain   Relevant Medications   gabapentin (NEURONTIN) 100 MG capsule     #Major depression, recurrent moderate #GAD Controlled, improving overall Remains off SSRI Lexapro (ineffective) and also remains off Buspar (side effect dizziness) No new concerns Will speak with Nurse CM Pam today here in office as well  #Chronic R Shoulder pain Acute on chronic flare from recent laundry with whipping motion of shoulder overhead. Clinically some impingement signs but similar to prior issue Continue Tylenol, Voltaren topical Add Gabapentin 100mg  titration up to 300-400mg  nightly - can help sleep rest as well, caution side effects, if cause dizzy or other problem notify and we can switch med. Follow-up in future - may consider muscle relaxant vs possibly low dose Tramadol if need in  future.   Meds ordered this encounter  Medications  .  gabapentin (NEURONTIN) 100 MG capsule    Sig: Start 1 capsule daily, increase by 1 cap every 2-3 days as tolerated up to 3 times a day, or may take 3 at once in evening.    Dispense:  90 capsule    Refill:  1      Follow up plan: Return in about 4 months (around 05/16/2020) for 4 month follow-up DM A1c, Chronic R Shoulder pain, anxiety, mood PHQ GAD.   Nobie Putnam, [facility] 01/16/2020, 8:57 AM

## 2020-01-16 NOTE — Chronic Care Management (AMB) (Signed)
Chronic Care Management   Follow Up Note   01/16/2020 Name: Charles York MRN: [id number] DOB: 1930/04/12  Referred by: Charles York Reason for referral : Chronic Care Management (RNCM: Follow up for Chronic Disease Management and Care Coordination Needs/)   Charles York is a 86 y.o. year old male who is a primary care patient of Charles York. The CCM team was consulted for assistance with chronic disease management and care coordination needs.    Review of patient status, including review of consultants reports, relevant laboratory and other test results, and collaboration with appropriate care team members and the patient's provider was performed as part of comprehensive patient evaluation and provision of chronic care management services.    SDOH (Social Determinants of Health) assessments performed: Yes See Care Plan activities for detailed interventions related to [facility])     Outpatient Encounter Medications as of 01/16/2020  Medication Sig Note   aspirin 325 MG EC tablet TK 1 T PO QD FOR UP TO 90 DAYS THEN CAN REDUCE TO 81 MG DAILY    atorvastatin (LIPITOR) 40 MG tablet Take 1 tablet (40 mg total) by mouth at bedtime.    ferrous sulfate 325 (65 FE) MG tablet Take 325 mg by mouth every other day.    gabapentin (NEURONTIN) 100 MG capsule Start 1 capsule daily, increase by 1 cap every 2-3 days as tolerated up to 3 times a day, or may take 3 at once in evening.    HUMALOG 100 UNIT/ML injection ADMINISTER 12 UNITS UNDER THE SKIN TWICE DAILY WITH A MEAL    HUMALOG KWIKPEN 100 UNIT/ML KwikPen Inject 0.12 mLs (12 Units total) into the skin 2 (two) times daily with a meal. Up to 50 units/day as directed by MD    hydroxypropyl methylcellulose / hypromellose (ISOPTO TEARS / GONIOVISC) 2.5 % ophthalmic solution Place 1 drop into both eyes 4 (four) times daily.    insulin aspart protamine - aspart (NOVOLOG 70/30 MIX) (70-30) 100 UNIT/ML FlexPen Inject 12  Units into the skin 2 (two) times daily. In the morning and evening    ipratropium (ATROVENT) 0.06 % nasal spray USE 2 SPRAYS IN EACH NOSTRIL FOUR TIMES DAILY FOR UP TO 5 TO 7 DAYS THEN STOP 12/29/2018: Dropped to twice daily due to excessive dryness.   LANTUS 100 UNIT/ML injection INJECT 27 UNITS SUBCUTANEOUSLY BEFORE BREAKFAST    losartan (COZAAR) 25 MG tablet TAKE 1 TABLET(25 MG) BY MOUTH DAILY    omeprazole (PRILOSEC) 20 MG capsule TAKE 1 CAPSULE(20 MG) BY MOUTH TWICE DAILY BEFORE A MEAL (Patient not taking: Reported on 01/16/2020)    polyethylene glycol powder (GLYCOLAX/MIRALAX) 17 GM/SCOOP powder Take 17-34 g by mouth 2 (two) times daily as needed for mild constipation or moderate constipation. (Patient not taking: Reported on 01/16/2020)    sucralfate (CARAFATE) 1 g tablet Take 1 tablet (1 g total) by mouth 4 (four) times daily.    vitamin B-12 (CYANOCOBALAMIN) 1000 MCG tablet Take 2,000 mcg by mouth daily.  (Patient not taking: Reported on 01/16/2020)    No facility-administered encounter medications on file as of 01/16/2020.     Objective:   Goals Addressed              This Visit's Progress     RNCM: pt-"I am feeling better since seeing Dr. Raliegh Ip" (pt-stated)        [place] (see longtitudinal plan of care for additional care plan information)  Current Barriers:  Chronic Disease Management support, education, and care coordination needs related to HTN, HLD, DMII, CKD Stage 3, Anxiety, and Depression  Clinical Goal(s) related to HTN, HLD, DMII, CKD Stage 3, Anxiety, and Depression:  Over the next 120 days, patient will:   Work with the care management team to address educational, disease management, and care coordination needs   Begin or continue self health monitoring activities as directed today Measure and record cbg (blood glucose) 2 times daily, Measure and record blood pressure 2 times per week, and adhere to a heart healthy/ADA diet  Call provider  office for new or worsened signs and symptoms Blood glucose findings outside established parameters, Blood pressure findings outside established parameters, Chest pain, and New or worsened symptom related to anxiety, ckd3 or other chronic conditions  Call care management team with questions or concerns  Verbalize basic understanding of patient centered plan of care established today  Interventions related to HTN, HLD, DMII, CKD Stage 3, Anxiety, and Depression:   Evaluation of current treatment plans and patient's adherence to plan as established by provider. The patient feels he does well at managing his care. His daughter lives with him but he is independent and cares for himself. 01-16-2020: The patient says he is doing well and only has a little problem with shoulder pain and discomfort at times. He says it mainly bothers him at night. Pcp has prescribed pain medications to help at night.    Assessed patient understanding of disease states. Patient understands his chronic conditions. 01-16-2020: The patient has decided he is doing well and wants to come off of his lexapro.  The patient will let the pcp know if there are changes.  Assessed patient's education and care coordination needs.  The patient wanted to know what to [place] when he needed refills on his medications. He has lost his wife and son and sometimes he grieves over them. 11-03-2019: Education given to the patient on calling the pcp for worsening s/s of depression or other issues related to his chronic conditions. 01-16-2020: The patient denies any new educational or care coordination needs.  Has RNCM information for changes in condition or new concerns.  Empathetic listening and support as the patient expressed loss of his wife and son and how that impacts him at times. He is effectively managing. Enjoys talking to his sister who is 35 in [place]. The patients daughter lives with him and he is happy that she is there with him. 11-03-2019:  Reflective listening as the patient talked about he and his wife being married for 57 years.  He took care of  her at home even after his family wanted him to have her placed in a nursing home. She had Alzheimer's disease and passed when she was 43 and he was 3. The patient states he was thankful he was able to keep her home and take care of her. The patient gets sad sometimes especially around the holidays but he feels like the change in medication will help him a lot. He was just concerned because of the dizziness.  01-16-2020: The patient states he is at a good place.  He has good support from his daughter and friends.   Review of the patients blood sugar and blood pressure readings. The patient verbalized his blood sugars have been a little elevated lately but he has been eating a lot of soup and this am his blood sugar was 153.  The patient admits he has not been taking his blood pressure  at home, has the ability to but hasn't. His blood pressure in the ED on 06/24/2019 was 156/70.  01-16-2020: VS stable and the patient endorses good blood sugar readings.  Provided disease specific education to patient. Education given on following a heart healthy/ADA diet and the benefits of watching dietary habits for health and well being. Patient is compliant with dietary restricitons. 01-16-2020: The patient is eating better. Maintaining weight.  Collaborated with appropriate clinical care team members regarding patient needs. Review of CCM team pharmacist and LCSW. The patient agrees to talk to the LCSW about his anxiety, depression and ways to help with mood. The patient is thankful for the team. Made sure the patient had contact information for the [facility] to call as needed for changes or needs before next outreach. 01-16-2020:Has LCSW support.  Patient Self Care Activities related to HTN, HLD, DMII, CKD Stage 3, Anxiety, and Depression:   Patient is unable to independently self-manage chronic health  conditions  Please see past updates related to this goal by clicking on the "Past Updates" button in the selected goal           Plan:   Telephone follow up appointment with care management team member scheduled for:03-12-2020 at 0900   [place], MSN, [facility] Mobile: [phone]

## 2020-01-17 ENCOUNTER — Ambulatory Visit: Payer: Medicare Other | Admitting: Licensed Clinical Social Worker

## 2020-01-17 DIAGNOSIS — F411 Generalized anxiety disorder: Secondary | ICD-10-CM

## 2020-01-17 DIAGNOSIS — F331 Major depressive disorder, recurrent, moderate: Secondary | ICD-10-CM

## 2020-01-17 DIAGNOSIS — N1831 Chronic kidney disease, stage 3a: Secondary | ICD-10-CM

## 2020-01-17 DIAGNOSIS — E1140 Type 2 diabetes mellitus with diabetic neuropathy, unspecified: Secondary | ICD-10-CM

## 2020-01-17 NOTE — Chronic Care Management (AMB) (Signed)
Chronic Care Management    Clinical Social Work Follow Up Note  01/17/2020 Name: JOHNTHOMAS LADER MRN: [id number] DOB: [birth year]  GENARO BEKKER is a 86 y.o. year old male who is a primary care patient of [name], DO. The CCM team was consulted for assistance with Mental Health Counseling and Resources.   Review of patient status, including review of consultants reports, other relevant assessments, and collaboration with appropriate care team members and the patient's provider was performed as part of comprehensive patient evaluation and provision of chronic care management services.    SDOH (Social Determinants of Health) assessments performed: Yes    Outpatient Encounter Medications as of 01/17/2020  Medication Sig Note  . aspirin 325 MG EC tablet TK 1 T PO QD FOR UP TO 90 DAYS THEN CAN REDUCE TO 81 MG DAILY   . atorvastatin (LIPITOR) 40 MG tablet Take 1 tablet (40 mg total) by mouth at bedtime.   . ferrous sulfate 325 (65 FE) MG tablet Take 325 mg by mouth every other day.   . gabapentin (NEURONTIN) 100 MG capsule Start 1 capsule daily, increase by 1 cap every 2-3 days as tolerated up to 3 times a day, or may take 3 at once in evening.   Marland Kitchen HUMALOG 100 UNIT/ML injection ADMINISTER 12 UNITS UNDER THE SKIN TWICE DAILY WITH A MEAL   . HUMALOG KWIKPEN 100 UNIT/ML KwikPen Inject 0.12 mLs (12 Units total) into the skin 2 (two) times daily with a meal. Up to 50 units/day as directed by MD   . hydroxypropyl methylcellulose / hypromellose (ISOPTO TEARS / GONIOVISC) 2.5 % ophthalmic solution Place 1 drop into both eyes 4 (four) times daily.   . insulin aspart protamine - aspart (NOVOLOG 70/30 MIX) (70-30) 100 UNIT/ML FlexPen Inject 12 Units into the skin 2 (two) times daily. In the morning and evening   . ipratropium (ATROVENT) 0.06 % nasal spray USE 2 SPRAYS IN EACH NOSTRIL FOUR TIMES DAILY FOR UP TO 5 TO 7 DAYS THEN STOP 12/29/2018: Dropped to twice daily due to excessive dryness.  Marland Kitchen  LANTUS 100 UNIT/ML injection INJECT 27 UNITS SUBCUTANEOUSLY BEFORE BREAKFAST   . losartan (COZAAR) 25 MG tablet TAKE 1 TABLET(25 MG) BY MOUTH DAILY   . omeprazole (PRILOSEC) 20 MG capsule TAKE 1 CAPSULE(20 MG) BY MOUTH TWICE DAILY BEFORE A MEAL (Patient not taking: Reported on 01/16/2020)   . polyethylene glycol powder (GLYCOLAX/MIRALAX) 17 GM/SCOOP powder Take 17-34 g by mouth 2 (two) times daily as needed for mild constipation or moderate constipation. (Patient not taking: Reported on 01/16/2020)   . sucralfate (CARAFATE) 1 g tablet Take 1 tablet (1 g total) by mouth 4 (four) times daily.   . vitamin B-12 (CYANOCOBALAMIN) 1000 MCG tablet Take 2,000 mcg by mouth daily.  (Patient not taking: Reported on 01/16/2020)    No facility-administered encounter medications on file as of 01/17/2020.     Goals Addressed    . SW-Track and Manage My Symptoms-Depression       Timeframe:  Long-Range Goal Priority:  Medium Start Date:  01/17/20                         Expected End Date:  04/17/19                    Follow Up Date- 90 days from 01/07/20   - avoid negative self-talk - develop a personal safety plan - develop a plan  to deal with triggers like holidays, anniversaries - exercise at least 2 to 3 times per week - have a plan for how to handle bad days - journal feelings and what helps to feel better or worse - spend time or talk with others at least 2 to 3 times per week - spend time or talk with others every day - watch for early signs of feeling worse - write in journal every day  - check out options for in-home help, long-term care or hospice - complete a living will - discuss my treatment options with the doctor or nurse - do one enjoyable thing every day - do something different, like talking to a new person or going to a new place, every day - learn something new by asking, reading and searching the Internet every day - make an audio or video recording for my loved ones - make  shared treatment decisions with doctor - meditate daily - name a health care proxy (decision maker) - share memories using a picture album or scrapbook with my loved ones - spend time with a child every day, borrow one if I have to - spend time outdoors at least 3 times a week - strengthen or fix relationships with loved ones    Why is this important?    Keeping track of your progress will help your treatment team find the right mix of medicine and therapy for you.   Write in your journal every day.   Day-to-day changes in depression symptoms are normal. It may be more helpful to check your progress at the end of each week instead of every day.     Depression screen [facility] 2/9 01/16/2020 11/01/2019 10/12/2019  Decreased Interest 1 1 3   Down, Depressed, Hopeless 0 0 2  PHQ - 2 Score 1 1 5   Altered sleeping 0 1 3  Tired, decreased energy 1 1 2   Change in appetite 0 0 3  Feeling bad or failure about yourself  0 0 0  Trouble concentrating 0 0 0  Moving slowly or fidgety/restless 0 0 0  Suicidal thoughts 0 0 0  PHQ-9 Score 2 3 13   Difficult doing work/chores Not difficult at all Not difficult at all Somewhat difficult  Some recent data might be hidden    GAD 7 : Generalized Anxiety Score 01/16/2020 11/01/2019 10/12/2019 04/01/2018  Nervous, Anxious, on Edge 1 1 3 1   Control/stop worrying 1 1 2 2   Worry too much - different things 1 1 2 1   Trouble relaxing 0 0 1 1  Restless 0 0 1 1  Easily annoyed or irritable 1 0 3 1  Afraid - awful might happen 1 0 2 0  Total GAD 7 Score 5 3 14 7   Anxiety Difficulty Not difficult at all Not difficult at all Somewhat difficult Somewhat difficult   Assessment, progress and current barriers:  Patient is currently experiencing symptoms of  anxiety and depression which seems to be exacerbated by his pain and insomnia.   Clinical Goal(s): Over the next 120 days, patient will work with SW to reduce or manage symptoms of anxiety, depression, insomnia, and mood  instability until connected for ongoing counseling.  Over the next 120 days, patient will work with SW to address concerns related to gaining healthy coping skills and resource connection in order to improve mental health Clinical Interventions:  . Assessed thoughts of SI, plan and access to means. Assessed patient's previous treatment, needs, coping skills, current treatment, support  system and barriers to care . Patient interviewed and appropriate assessments performed: brief mental health assessment . Provided basic mental health support, education and interventions . Discussed several options for long term counseling based on need and insurance. Assisted patient with narrowing the options down to [facility] or [facility] but patient declined needing mental health resource implementation at this time . Reviewed mental health medications with patient prescribed by PCP and discussed compliance. Patient reports that his Buspar medication is effectively working for him. . Patient was prescribed gabapentin during his office visit with PCP yesterday to help with his chronic pain and insomnia.  . Assisted patient/caregiver with obtaining information about health plan benefits . Provided education and assistance to client regarding Advanced Directives. . Provided education to patient/caregiver about Hospice and/or Palliative Care services  Follow Up Plan:  SW will follow up with patient by phone over the next quarter  Notes: Patient reports that the [place] mailed him a bike that he uses every morning to get exercise. He reports that he completes several push up's in the morning as well. Patient went to the grocery store this morning by himself to get food. Patient remains active.      Follow Up Plan: SW will follow up with patient by phone over the next quarter  Eula Fried, [place], MSW, [email or web address] Phone:  [phone]

## 2020-01-20 ENCOUNTER — Emergency Department
Admission: EM | Admit: 2020-01-20 | Discharge: 2020-01-20 | Disposition: A | Discharge status: Home / Self Care | Payer: Medicare Other | Attending: Emergency Medicine | Admitting: Emergency Medicine

## 2020-01-20 ENCOUNTER — Emergency Department: Payer: Medicare Other

## 2020-01-20 ENCOUNTER — Other Ambulatory Visit: Payer: Self-pay

## 2020-01-20 DIAGNOSIS — Z5321 Procedure and treatment not carried out due to patient leaving prior to being seen by health care provider: Secondary | ICD-10-CM | POA: Diagnosis not present

## 2020-01-20 DIAGNOSIS — R202 Paresthesia of skin: Secondary | ICD-10-CM | POA: Insufficient documentation

## 2020-01-20 DIAGNOSIS — R531 Weakness: Secondary | ICD-10-CM | POA: Insufficient documentation

## 2020-01-20 DIAGNOSIS — R29810 Facial weakness: Secondary | ICD-10-CM | POA: Diagnosis not present

## 2020-01-20 DIAGNOSIS — R42 Dizziness and giddiness: Secondary | ICD-10-CM | POA: Diagnosis not present

## 2020-01-20 LAB — CBG MONITORING, ED: Glucose-Capillary: 117 mg/dL — ABNORMAL HIGH (ref 70–99)

## 2020-01-20 LAB — PROTIME-INR
INR: 1 (ref 0.8–1.2)
Prothrombin Time: 12.8 seconds (ref 11.4–15.2)

## 2020-01-20 LAB — COMPREHENSIVE METABOLIC PANEL
ALT: 18 U/L (ref 0–44)
AST: 15 U/L (ref 15–41)
Albumin: 4.2 g/dL (ref 3.5–5.0)
Alkaline Phosphatase: 77 U/L (ref 38–126)
Anion gap: 12 (ref 5–15)
BUN: 28 mg/dL — ABNORMAL HIGH (ref 8–23)
CO2: 23 mmol/L (ref 22–32)
Calcium: 9.5 mg/dL (ref 8.9–10.3)
Chloride: 100 mmol/L (ref 98–111)
Creatinine, Ser: 1.32 mg/dL — ABNORMAL HIGH (ref 0.61–1.24)
GFR, Estimated: 52 mL/min — ABNORMAL LOW (ref >60)
Glucose, Bld: 120 mg/dL — ABNORMAL HIGH (ref 70–99)
Potassium: 4.6 mmol/L (ref 3.5–5.1)
Sodium: 135 mmol/L (ref 135–145)
Total Bilirubin: 0.9 mg/dL (ref 0.3–1.2)
Total Protein: 7.5 g/dL (ref 6.5–8.1)

## 2020-01-20 LAB — DIFFERENTIAL
Abs Immature Granulocytes: 0.03 10*3/uL (ref 0.00–0.07)
Basophils Absolute: 0.1 10*3/uL (ref 0.0–0.1)
Basophils Relative: 1 %
Eosinophils Absolute: 0.1 10*3/uL (ref 0.0–0.5)
Eosinophils Relative: 1 %
Immature Granulocytes: 0 %
Lymphocytes Relative: 35 %
Lymphs Abs: 3.4 10*3/uL (ref 0.7–4.0)
Monocytes Absolute: 0.7 10*3/uL (ref 0.1–1.0)
Monocytes Relative: 7 %
Neutro Abs: 5.5 10*3/uL (ref 1.7–7.7)
Neutrophils Relative %: 56 %

## 2020-01-20 LAB — CBC
HCT: 39.5 % (ref 39.0–52.0)
Hemoglobin: 13.2 g/dL (ref 13.0–17.0)
MCH: 32.3 pg (ref 26.0–34.0)
MCHC: 33.4 g/dL (ref 30.0–36.0)
MCV: 96.6 fL (ref 80.0–100.0)
Platelets: 248 10*3/uL (ref 150–400)
RBC: 4.09 MIL/uL — ABNORMAL LOW (ref 4.22–5.81)
RDW: 12.6 % (ref 11.5–15.5)
WBC: 9.8 10*3/uL (ref 4.0–10.5)
nRBC: 0 % (ref 0.0–0.2)

## 2020-01-20 LAB — APTT: aPTT: 30 seconds (ref 24–36)

## 2020-01-20 LAB — TROPONIN I (HIGH SENSITIVITY): Troponin I (High Sensitivity): 6 ng/L (ref <18)

## 2020-01-20 MED ORDER — SODIUM CHLORIDE 0.9% FLUSH: 3.0000 mL | Freq: Once | INTRAVENOUS | Status: DC

## 2020-01-20 NOTE — ED Triage Notes (Signed)
First RN Note: Pt to ED with c/o awakening yesterday with R sided facial numbness x 1 hour then resolved, states awoke this morning with same symptoms, however worse and also c/o generalized weakness.

## 2020-01-20 NOTE — ED Triage Notes (Signed)
Pt here with daughter from home.  Pt with c/o R sided facial numbess, and right sided lower arm numbness. Patient reports he first started experiencing these symptoms on awakening yesterday then they resolved, the sympotoms started again this morning on awakening and resolved. Pt also complaining of R sided chest pain, non -radiating, shortness of breath when laying flat.   Face symmetiral on assessment, R sided grip strength slightly weaker than left.

## 2020-02-22 ENCOUNTER — Encounter: Payer: Self-pay | Admitting: Family Medicine

## 2020-02-22 ENCOUNTER — Other Ambulatory Visit: Payer: Self-pay

## 2020-02-22 ENCOUNTER — Ambulatory Visit (INDEPENDENT_AMBULATORY_CARE_PROVIDER_SITE_OTHER): Payer: Medicare Other | Admitting: Family Medicine

## 2020-02-22 ENCOUNTER — Telehealth: Payer: Self-pay

## 2020-02-22 VITALS — BP 152/61 | HR 76 | Ht 68.0 in | Wt 182.0 lb

## 2020-02-22 DIAGNOSIS — M25511 Pain in right shoulder: Secondary | ICD-10-CM | POA: Diagnosis not present

## 2020-02-22 DIAGNOSIS — G8929 Other chronic pain: Secondary | ICD-10-CM

## 2020-02-22 DIAGNOSIS — G5621 Lesion of ulnar nerve, right upper limb: Secondary | ICD-10-CM

## 2020-02-22 MED ORDER — PREDNISONE 20 MG PO TABS: ORAL_TABLET | ORAL | 0 refills | Status: DC

## 2020-02-22 NOTE — Progress Notes (Signed)
Subjective:    Patient ID: Charles York, male    DOB: [birth year], 87 y.o.   MRN: [id number]  Charles York is a 87 y.o. male presenting on 02/22/2020 for Fall [place] while blowing leaves and possibly injured his right shoulder)   HPI   Fall Injury / Right Upper Extremity Paresthesia Right Shoulder Pain, chronic Osteoarthritis  He describes new R shoulder injury about 10 days, with walking and he tripped and fell forward, hit R shoulder on a pipe in yard. He has had chronic R shoulder pain in past. He said he had no significant problem at first then gradual onset worse R shoulder pain, some R posterior shoulder pain with radiation into arm, down into 4th and 5th finger in hand, with pins and needles pain sensation worse if holding arm up and driving or unsupported, he has good range of motion still reaching up and out and not limiting him but pain is bothering him now  Uses tylenol and voltaren PRN He takes Gabapentin 100mg  x 3 = 300mg  nightly helps him rest  Denies any redness swelling rash, other joint pain, loss of sensation, weaker grip  Depression screen [facility] 2/9 01/16/2020 11/01/2019 10/12/2019  Decreased Interest 1 1 3   Down, Depressed, Hopeless 0 0 2  PHQ - 2 Score 1 1 5   Altered sleeping 0 1 3  Tired, decreased energy 1 1 2   Change in appetite 0 0 3  Feeling bad or failure about yourself  0 0 0  Trouble concentrating 0 0 0  Moving slowly or fidgety/restless 0 0 0  Suicidal thoughts 0 0 0  PHQ-9 Score 2 3 13   Difficult doing work/chores Not difficult at all Not difficult at all Somewhat difficult  Some recent data might be hidden    Social History   Tobacco Use  . Smoking status: Former Smoker    Types: Cigarettes    Quit date: 02/04/1983    Years since quitting: 37.0  . Smokeless tobacco: Former Network engineer  . Vaping Use: Never used  Substance Use Topics  . Alcohol use: No    Alcohol/week: 0.0 standard drinks  . Drug use: No    Review of Systems Per HPI  unless specifically indicated above     Objective:    BP (!) 152/61   Pulse 76   Ht 5\' 8"  (1.727 m)   Wt 182 lb (82.6 kg)   SpO2 99%   BMI 27.67 kg/m   Wt Readings from Last 3 Encounters:  02/22/20 182 lb (82.6 kg)  01/20/20 182 lb (82.6 kg)  01/16/20 187 lb (84.8 kg)    Physical Exam Vitals and nursing note reviewed.  Constitutional:      General: He is not in acute distress.    Appearance: He is well-developed and well-nourished. He is not diaphoretic.  Musculoskeletal:     Comments: RIght Shoulder Inspection: Normal appearance bilateral symmetrical Palpation: Mild tender to palpation over posterior shoulder ROM: Full intact active ROM forward flexion, abduction, internal / external rotation, symmetrical Special Testing: Rotator cuff testing positive for pain with worse empty can, Hawkin's AC impingement positive for pain Strength: Normal strength 5/5 flex/ext, ext rot / int rot, grip, rotator cuff str testing. Neurovascular: Distally intact pulses, sensation to light touch   Skin:    General: Skin is warm and dry.  Neurological:     Mental Status: He is alert.    Results for orders placed or performed during the hospital  encounter of 01/20/20  Protime-INR  Result Value Ref Range   Prothrombin Time 12.8 11.4 - 15.2 seconds   INR 1.0 0.8 - 1.2  APTT  Result Value Ref Range   aPTT 30 24 - 36 seconds  CBC  Result Value Ref Range   WBC 9.8 4.0 - 10.5 K/uL   RBC 4.09 (L) 4.22 - 5.81 MIL/uL   Hemoglobin 13.2 13.0 - 17.0 g/dL   HCT 39.5 39.0 - 52.0 %   MCV 96.6 80.0 - 100.0 fL   MCH 32.3 26.0 - 34.0 pg   MCHC 33.4 30.0 - 36.0 g/dL   RDW 12.6 11.5 - 15.5 %   Platelets 248 150 - 400 K/uL   nRBC 0.0 0.0 - 0.2 %  Differential  Result Value Ref Range   Neutrophils Relative % 56 %   Neutro Abs 5.5 1.7 - 7.7 K/uL   Lymphocytes Relative 35 %   Lymphs Abs 3.4 0.7 - 4.0 K/uL   Monocytes Relative 7 %   Monocytes Absolute 0.7 0.1 - 1.0 K/uL   Eosinophils Relative 1 %    Eosinophils Absolute 0.1 0.0 - 0.5 K/uL   Basophils Relative 1 %   Basophils Absolute 0.1 0.0 - 0.1 K/uL   Immature Granulocytes 0 %   Abs Immature Granulocytes 0.03 0.00 - 0.07 K/uL  Comprehensive metabolic panel  Result Value Ref Range   Sodium 135 135 - 145 mmol/L   Potassium 4.6 3.5 - 5.1 mmol/L   Chloride 100 98 - 111 mmol/L   CO2 23 22 - 32 mmol/L   Glucose, Bld 120 (H) 70 - 99 mg/dL   BUN 28 (H) 8 - 23 mg/dL   Creatinine, Ser 1.32 (H) 0.61 - 1.24 mg/dL   Calcium 9.5 8.9 - 10.3 mg/dL   Total Protein 7.5 6.5 - 8.1 g/dL   Albumin 4.2 3.5 - 5.0 g/dL   AST 15 15 - 41 U/L   ALT 18 0 - 44 U/L   Alkaline Phosphatase 77 38 - 126 U/L   Total Bilirubin 0.9 0.3 - 1.2 mg/dL   GFR, Estimated 52 (L) >60 mL/min   Anion gap 12 5 - 15  CBG monitoring, ED  Result Value Ref Range   Glucose-Capillary 117 (H) 70 - 99 mg/dL  Troponin I (High Sensitivity)  Result Value Ref Range   Troponin I (High Sensitivity) 6 <18 ng/L      Assessment & Plan:   Problem List Items Addressed This Visit    Chronic right shoulder pain   Relevant Medications   gabapentin (NEURONTIN) 100 MG capsule   predniSONE (DELTASONE) 20 MG tablet    Other Visit Diagnoses    Entrapment of right ulnar nerve    -  Primary   Relevant Medications   gabapentin (NEURONTIN) 100 MG capsule   predniSONE (DELTASONE) 20 MG tablet     Acute on Chronic R Shoulder pain, flared by recent fall injury Likely component of ulnar nerve distribution entrapment or impingement based on symptoms into hand 4th/5th fingers Full range of motion No sign of rotator cuff tear can be tendinopathy or bursitis flare up as well.  Trial on inc dose Gabapentin from 300mg  nightly up to 100mg  AM and 100mg  afternoon in addition to 300mg  PM, caution sedation  START Prednisone taper 7 day for neuropathy symptoms  If unresolved or not improving over few week we can refer to neurology for nerve conduction / injection or other therapy.    Meds  ordered  this encounter  Medications  . predniSONE (DELTASONE) 20 MG tablet    Sig: Take daily with food. Start with 60mg  (3 pills) x 2 days, then reduce to 40mg  (2 pills) x 2 days, then 20mg  (1 pill) x 3 days    Dispense:  13 tablet    Refill:  0     Follow up plan: Return in about 2 weeks (around 03/07/2020), or if symptoms worsen or fail to improve, for R shoulder pain nerve injury.  Nobie Putnam, [facility] 02/22/2020, 2:49 PM

## 2020-02-22 NOTE — Patient Instructions (Addendum)
Thank you for coming to the office today.  Right shoulder pain Likely nerve impingement ULnar nerve  Take Prednisone steroid for anti inflammatory pain relief  Keep taking Tylenol  Increase Gabapentin now to 1 in morning 1 in afternoon and 3 at night.  If not improved by 1-2 weeks, call or message and we can refer to Neurologist for nerve impingement.  Please schedule a Follow-up Appointment to: Return in about 2 weeks (around 03/07/2020), or if symptoms worsen or fail to improve, for R shoulder pain nerve injury.  If you have any other questions or concerns, please feel free to call the office or send a message through [place]. You may also schedule an earlier appointment if necessary.  Additionally, you may be receiving a survey about your experience at our office within a few days to 1 week by e-mail or mail. We value your feedback.  Nobie Putnam, DO [place]

## 2020-02-22 NOTE — Telephone Encounter (Signed)
Pt.is requesting to change drug store to [facility] for all medication

## 2020-03-02 ENCOUNTER — Telehealth: Payer: Self-pay

## 2020-03-02 DIAGNOSIS — G5621 Lesion of ulnar nerve, right upper limb: Secondary | ICD-10-CM

## 2020-03-02 NOTE — Telephone Encounter (Signed)
Called patient. He is aware that a referral to [facility] has been sent and he should await their call to get him scheduled.

## 2020-03-02 NOTE — Telephone Encounter (Signed)
Copied from [place] [phone]. Topic: General - Inquiry >> Mar 02, 2020 10:08 AM Gillis Ends D wrote: Reason for CRM: Patient would like a call back from Dr. Raliegh Ip. He can be reached at [phone]. Please advise  I called and spoke with the patient and he said you had mentioned to call and let us know if the medication that was prescribed for his nerve pain worked.Marland Kitchen He stated it helped a tiny bit but he would like to proceed with a referral to Neurology.

## 2020-03-02 NOTE — Telephone Encounter (Signed)
Please notify patient  Referral sent to [facility] - for Ulnar Nerve Entrapment vs Impingement.  He should stay tuned for them to call him for apt.  Nobie Putnam, [facility] 03/02/2020, 1:47 PM

## 2020-03-09 ENCOUNTER — Other Ambulatory Visit: Payer: Self-pay | Admitting: Family Medicine

## 2020-03-09 DIAGNOSIS — E785 Hyperlipidemia, unspecified: Secondary | ICD-10-CM

## 2020-03-09 DIAGNOSIS — R202 Paresthesia of skin: Secondary | ICD-10-CM | POA: Diagnosis not present

## 2020-03-09 DIAGNOSIS — M25511 Pain in right shoulder: Secondary | ICD-10-CM | POA: Diagnosis not present

## 2020-03-09 DIAGNOSIS — R200 Anesthesia of skin: Secondary | ICD-10-CM | POA: Diagnosis not present

## 2020-03-09 DIAGNOSIS — E1169 Type 2 diabetes mellitus with other specified complication: Secondary | ICD-10-CM

## 2020-03-09 DIAGNOSIS — G629 Polyneuropathy, unspecified: Secondary | ICD-10-CM | POA: Diagnosis not present

## 2020-03-09 DIAGNOSIS — K219 Gastro-esophageal reflux disease without esophagitis: Secondary | ICD-10-CM

## 2020-03-12 ENCOUNTER — Telehealth: Payer: Medicare Other

## 2020-03-21 DIAGNOSIS — M25511 Pain in right shoulder: Secondary | ICD-10-CM | POA: Diagnosis not present

## 2020-03-21 DIAGNOSIS — M75111 Incomplete rotator cuff tear or rupture of right shoulder, not specified as traumatic: Secondary | ICD-10-CM | POA: Diagnosis not present

## 2020-03-22 ENCOUNTER — Telehealth: Payer: Medicare Other | Admitting: General Practice

## 2020-03-22 ENCOUNTER — Ambulatory Visit (INDEPENDENT_AMBULATORY_CARE_PROVIDER_SITE_OTHER): Payer: Medicare Other | Admitting: General Practice

## 2020-03-22 DIAGNOSIS — I10 Essential (primary) hypertension: Secondary | ICD-10-CM

## 2020-03-22 DIAGNOSIS — E1140 Type 2 diabetes mellitus with diabetic neuropathy, unspecified: Secondary | ICD-10-CM

## 2020-03-22 DIAGNOSIS — E785 Hyperlipidemia, unspecified: Secondary | ICD-10-CM

## 2020-03-22 DIAGNOSIS — M25511 Pain in right shoulder: Secondary | ICD-10-CM

## 2020-03-22 DIAGNOSIS — G8929 Other chronic pain: Secondary | ICD-10-CM

## 2020-03-22 DIAGNOSIS — E1169 Type 2 diabetes mellitus with other specified complication: Secondary | ICD-10-CM

## 2020-03-22 NOTE — Patient Instructions (Signed)
Visit Information  PATIENT GOALS: Patient Care Plan: RNCM: Diabetes Type 2 (Adult)    Problem Identified: RNCM: Glycemic Management (Diabetes, Type 2)   Priority: Medium    Long-Range Goal: RNCM: Glycemic Management Optimized   Priority: Medium  Note:   Objective:  Lab Results  Component Value Date   HGBA1C 7.6 (H) 09/12/2019 .   Lab Results  Component Value Date   CREATININE 1.32 (H) 01/20/2020   CREATININE 1.30 (H) 10/05/2019   CREATININE 1.40 (H) 09/12/2019 .   Marland Kitchen No results found for: EGFR Current Barriers:  Marland Kitchen Knowledge Deficits related to basic Diabetes pathophysiology and self care/management . Knowledge Deficits related to medications used for management of diabetes . Lacks social connections . Does not contact provider office for questions/concerns Case Manager Clinical Goal(s):  . patient will demonstrate improved adherence to prescribed treatment plan for diabetes self care/management as evidenced by: daily monitoring and recording of CBG  adherence to ADA/ carb modified diet adherence to prescribed medication regimen contacting provider for new or worsened symptoms or questions Interventions:  . Collaboration with [name], DO regarding development and update of comprehensive plan of care as evidenced by provider attestation and co-signature . Inter-disciplinary care team collaboration (see longitudinal plan of care) . Provided education to patient about basic DM disease process. Discussed fasting  blood sugar goal <130 and post prandial of <180 . Reviewed medications with patient and discussed importance of medication adherence . Discussed plans with patient for ongoing care management follow up and provided patient with direct contact information for care management team . Provided patient with written educational materials related to hypo and hyperglycemia and importance of correct treatment . Advised patient, providing education and rationale, to  check cbg bid and record, calling pcp for findings outside established parameters.   . Review of patient status, including review of consultants reports, relevant laboratory and other test results, and medications completed. Patient Goals/Self-Care Activities - Self administers oral medications as prescribed Self administers insulin as prescribed Attends all scheduled provider appointments Checks blood sugars as prescribed and utilize hyper and hypoglycemia protocol as needed Adheres to prescribed ADA/carb modified - barriers to adherence to treatment plan identified - blood glucose monitoring encouraged - blood glucose readings reviewed - mutual A1C goal set or reviewed - resources required to improve adherence to care identified - self-awareness of signs/symptoms of hypo or hyperglycemia encouraged - use of blood glucose monitoring log promoted Follow Up Plan: Telephone follow up appointment with care management team member scheduled for: 05-17-2020 at 230 pm   Task: RNCM: Alleviate Barriers to Glycemic Management   Note:   Care Management Activities:    - barriers to adherence to treatment plan identified - blood glucose monitoring encouraged - blood glucose readings reviewed - mutual A1C goal set or reviewed - resources required to improve adherence to care identified - self-awareness of signs/symptoms of hypo or hyperglycemia encouraged - use of blood glucose monitoring log promoted       Patient Care Plan: RNCM: Chronic Pain (Adult)    Problem Identified: RNCM: Pain Management Plan (Chronic Pain)   Priority: High    Goal: RNCM: Pain Management Plan Developed   Priority: High  Note:   Current Barriers:  Marland Kitchen Knowledge Deficits related to managing acute/chronic pain . Non-adherence to scheduled provider appointments . Non-adherence to prescribed medication regimen . Difficulty obtaining medications . Chronic Disease Management support and education needs related to chronic  pain . Lacks social connections . Does not  contact provider office for questions/concerns Nurse Case Manager Clinical Goal(s):  . patient will verbalize understanding of plan for managing pain . patient will demonstrate use of different relaxation  skills and/or diversional activities to assist with pain reduction (distraction, imagery, relaxation, massage, acupressure, TENS, heat, and cold application . patient will report pain at a level less than 3 to 4 on a 10-10 rating scale . patient will use pharmacological and nonpharmacological pain relief strategies . patient will verbalize acceptable level of pain relief and ability to engage in desired activities . patient will engage in desired activities without an increase in pain level Interventions:  . Collaboration with [name], DO regarding development and update of comprehensive plan of care as evidenced by provider attestation and co-signature . Inter-disciplinary care team collaboration (see longitudinal plan of care) . - deep breathing, relaxation and mindfulness use promoted . - effectiveness of pharmacologic therapy monitored . - medication-induced side effects managed . - misuse of pain medication assessed . - motivation and barriers to change assessed and addressed . - pain assessed . - pain treatment goals reviewed . - premedication prior to activity encouraged . Evaluation of current treatment plan related to pain to right shoulder and patient's adherence to plan as established by provider. . Advised patient to call the office for changes in pain or intensity of pain  . Provided education to patient re: monitoring for changes in level or intensity of pain and discomfort  . Reviewed medications with patient and discussed compliance. The patient is happy that the gabapentin is working well for his pain level and he sees a big improvement with his pain level  . Discussed plans with patient for ongoing care  management follow up and provided patient with direct contact information for care management team . Allow patient to maintain a diary of pain ratings, timing, precipitating events, medications, treatments, and what works best to relieve pain,  . Refer to support groups and self-help groups . Educate patient about the use of pharmacological interventions for pain management- antianxiety, antidepressants, NSAIDS, opioid analgesics,  . Explain the importance of lifestyle modifications to effective pain management  Patient Goals/Self Care Activities:  .  Marland Kitchen Self-administers medications as prescribed . Attends all scheduled provider appointments . Calls pharmacy for medication refills . Calls provider office for new concerns or questions . - mutually acceptable comfort goal set . - pain assessed . - pain management plan developed . - pain treatment goals reviewed . - patient response to treatment assessed . - sharing of pain management plan with teachers and other caregivers encouraged Follow Up Plan: Telephone follow up appointment with care management team member scheduled for:05-17-2020     Task: RNCM: Partner to Develop Chronic Pain Management Plan   Note:   Care Management Activities:    - mutually acceptable comfort goal set - pain assessed - pain management plan developed - pain treatment goals reviewed - patient response to treatment assessed - sharing of pain management plan with teachers and other caregivers encouraged       Patient Care Plan: RNCM: Hypertension (Adult)    Problem Identified: RNCM: Hypertension (Hypertension)   Priority: Medium    Long-Range Goal: RNCM: Hypertension Monitored   Priority: Medium  Note:   Objective:  . Last practice recorded BP readings:  BP Readings from Last 3 Encounters:  02/22/20 (!) 152/61  01/20/20 121/88  01/16/20 139/62 .   Marland Kitchen Most recent eGFR/CrCl: No results found for: EGFR  No components  found for: CRCL Current Barriers:   Marland Kitchen Knowledge Deficits related to basic understanding of hypertension pathophysiology and self care management . Knowledge Deficits related to understanding of medications prescribed for management of hypertension . Lacks social connections . Does not contact provider office for questions/concerns Case Manager Clinical Goal(s):  Marland Kitchen Over the next 120 days, patient will verbalize understanding of plan for hypertension management . Over the next 120 days, patient will demonstrate improved adherence to prescribed treatment plan for hypertension as evidenced by taking all medications as prescribed, monitoring and recording blood pressure as directed, adhering to low sodium/DASH diet . Over the next 120 days, patient will demonstrate improved health management independence as evidenced by checking blood pressure as directed and notifying PCP if SBP>160 or DBP > 90, taking all medications as prescribe, and adhering to a low sodium diet as discussed. . Over the next 120 days, patient will verbalize basic understanding of hypertension disease process and self health management plan as evidenced by hearth healthy diet compliance, medications compliance, working with CCM team to manage health and well being  Interventions:  . Collaboration with [name], DO regarding development and update of comprehensive plan of care as evidenced by provider attestation and co-signature . Inter-disciplinary care team collaboration (see longitudinal plan of care) . Evaluation of current treatment plan related to hypertension self management and patient's adherence to plan as established by provider. . Provided education to patient re: stroke prevention, s/s of heart attack and stroke, DASH diet, complications of uncontrolled blood pressure . Reviewed medications with patient and discussed importance of compliance . Discussed plans with patient for ongoing care management follow up and provided patient with direct  contact information for care management team . Advised patient, providing education and rationale, to monitor blood pressure daily and record, calling PCP for findings outside established parameters.  Patient Goals/Self-Care Activities . Over the next 120 days, patient will:  - Self administers medications as prescribed Attends all scheduled provider appointments Calls provider office for new concerns, questions, or BP outside discussed parameters Checks BP and records as discussed Follows a low sodium diet/DASH diet - blood pressure trends reviewed - depression screen reviewed - home or ambulatory blood pressure monitoring encouraged Follow Up Plan: Telephone follow up appointment with care management team member scheduled for: 05-17-2020 at 230 pm   Task: RNCM: Identify and Monitor Blood Pressure Elevation   Note:   Care Management Activities:    - blood pressure trends reviewed - depression screen reviewed - home or ambulatory blood pressure monitoring encouraged     Patient Care Plan: RNCM: HLD Management    Problem Identified: RNCM: HLD Management   Priority: Medium    Goal: RNCM: HLD Management   Note:   Current Barriers:  . Poorly controlled hyperlipidemia, complicated by HTN, DM, shoulder pain  . Current antihyperlipidemic regimen: Lipitor 40 mg QD . Most recent lipid panel:     Component Value Date/Time   CHOL 147 09/12/2019 0757   TRIG 125 09/12/2019 0757   HDL 51 09/12/2019 0757   CHOLHDL 2.9 09/12/2019 0757   LDLCALC 75 09/12/2019 0757 .   Marland Kitchen ASCVD risk enhancing conditions: age >71, DM, HTN, CKD,  former smoker . Lacks social connections . Does not contact provider office for questions/concerns  RN Care Manager Clinical Goal(s):  Marland Kitchen Over the next 120 days, patient will work with Consulting civil engineer, providers, and care team towards execution of optimized self-health management plan . patient will verbalize understanding  of plan for HLD . patient will work with  [facility] and pcp to address needs related to effective management of HLD   Interventions: . Collaboration with [name], DO regarding development and update of comprehensive plan of care as evidenced by provider attestation and co-signature . Inter-disciplinary care team collaboration (see longitudinal plan of care) . Medication review performed; medication list updated in electronic medical record.  Bertram Savin care team collaboration (see longitudinal plan of care) . Referred to pharmacy team for assistance with HLD medication management . Evaluation of current treatment plan related to HLD  and patient's adherence to plan as established by provider. . Advised patient to call the office for changes or questions . Provided education to patient re: heart healthy/ADA diet, taking medications as prescribed and working with CCM team to optimize health and well being  . Discussed plans with patient for ongoing care management follow up and provided patient with direct contact information for care management team   Patient Goals/Self-Care Activities: . Over the next 120 days, patient will:   - call for medicine refill 2 or 3 days before it runs out - call if I am sick and can't take my medicine - keep a list of all the medicines I take; vitamins and herbals too - learn to read medicine labels - use a pillbox to sort medicine - use an alarm clock or phone to remind me to take my medicine - change to whole grain breads, cereal, pasta - drink 6 to 8 glasses of water each day - eat 3 to 5 servings of fruits and vegetables each day - eat 5 or 6 small meals each day - fill half the plate with nonstarchy vegetables - limit fast food meals to no more than 1 per week - manage portion size - prepare main meal at home 3 to 5 days each week - read food labels for fat, fiber, carbohydrates and portion size - be open to making changes - I can manage, know and watch for signs of a  heart attack - if I have chest pain, call for help - learn about small changes that will make a big difference - learn my personal risk factors  - barriers to meeting goals identified - change-talk evoked - choices provided - decision-making supported - health risks reviewed - problem-solving facilitated - questions answered - readiness for change evaluated - reassurance provided - self-reflection promoted - self-reliance encouraged  Follow Up Plan: Telephone follow up appointment with care management team member scheduled for:05-17-2020 at 230 pm      Task: RNCM: Mutually Develop and Royce Macadamia Achievement of Patient Goals   Note:   Care Management Activities:    - barriers to meeting goals identified - change-talk evoked - choices provided - decision-making supported - health risks reviewed - problem-solving facilitated - questions answered - readiness for change evaluated - reassurance provided - self-reflection promoted - self-reliance encouraged         Patient verbalizes understanding of instructions provided today and agrees to view in [place].   Telephone follow up appointment with care management team member scheduled for: 05-17-2020 at 230 pm  Noreene Larsson RN, MSN, [place] Mobile: [phone]

## 2020-03-22 NOTE — Chronic Care Management (AMB) (Signed)
Chronic Care Management   CCM RN Visit Note  03/22/2020 Name: Charles York MRN: [id number] DOB: [birth year]  Subjective: Charles York is a 87 y.o. year old male who is a primary care patient of [name], DO. The care management team was consulted for assistance with disease management and care coordination needs.    Engaged with patient by telephone for follow up visit in response to provider referral for case management and/or care coordination services.   Consent to Services:  The patient was given information about Chronic Care Management services, agreed to services, and gave verbal consent prior to initiation of services.  Please see initial visit note for detailed documentation.   Patient agreed to services and verbal consent obtained.   Assessment: Review of patient past medical history, allergies, medications, health status, including review of consultants reports, laboratory and other test data, was performed as part of comprehensive evaluation and provision of chronic care management services.   SDOH (Social Determinants of Health) assessments and interventions performed:    CCM Care Plan  Allergies  Allergen Reactions  . Trulicity [Dulaglutide] Swelling    Swollen lips, shortness of breath,   . Buspirone     Dizziness  . Penicillins Itching and Rash    Has patient had a PCN reaction causing immediate rash, facial/tongue/throat swelling, SOB or lightheadedness with hypotension: No Has patient had a PCN reaction causing severe rash involving mucus membranes or skin necrosis: No Has patient had a PCN reaction that required hospitalization: No Has patient had a PCN reaction occurring within the last 10 years: No If all of the above answers are "NO", then may proceed with Cephalosporin use.     Outpatient Encounter Medications as of 03/22/2020  Medication Sig Note  . aspirin 325 MG EC tablet TK 1 T PO QD FOR UP TO 90 DAYS THEN CAN REDUCE TO 81 MG DAILY   .  atorvastatin (LIPITOR) 40 MG tablet Take 1 tablet (40 mg total) by mouth at bedtime.   . ferrous sulfate 325 (65 FE) MG tablet Take 325 mg by mouth every other day.   . gabapentin (NEURONTIN) 100 MG capsule Take 1 cap in morning, 1 cap in afternoon, and 3 caps at night.   Marland Kitchen HUMALOG 100 UNIT/ML injection ADMINISTER 12 UNITS UNDER THE SKIN TWICE DAILY WITH A MEAL   . HUMALOG KWIKPEN 100 UNIT/ML KwikPen Inject 0.12 mLs (12 Units total) into the skin 2 (two) times daily with a meal. Up to 50 units/day as directed by MD   . hydroxypropyl methylcellulose / hypromellose (ISOPTO TEARS / GONIOVISC) 2.5 % ophthalmic solution Place 1 drop into both eyes 4 (four) times daily.   . insulin aspart protamine - aspart (NOVOLOG 70/30 MIX) (70-30) 100 UNIT/ML FlexPen Inject 12 Units into the skin 2 (two) times daily. In the morning and evening   . ipratropium (ATROVENT) 0.06 % nasal spray USE 2 SPRAYS IN EACH NOSTRIL FOUR TIMES DAILY FOR UP TO 5 TO 7 DAYS THEN STOP 12/29/2018: Dropped to twice daily due to excessive dryness.  Marland Kitchen LANTUS 100 UNIT/ML injection INJECT 27 UNITS SUBCUTANEOUSLY BEFORE BREAKFAST   . losartan (COZAAR) 25 MG tablet TAKE 1 TABLET(25 MG) BY MOUTH DAILY   . omeprazole (PRILOSEC) 20 MG capsule TAKE 1 CAPSULE(20 MG) BY MOUTH TWICE DAILY BEFORE A MEAL (Patient not taking: No sig reported)   . polyethylene glycol powder (GLYCOLAX/MIRALAX) 17 GM/SCOOP powder Take 17-34 g by mouth 2 (two) times daily as needed for  mild constipation or moderate constipation. (Patient not taking: No sig reported)   . predniSONE (DELTASONE) 20 MG tablet Take daily with food. Start with 28m (3 pills) x 2 days, then reduce to 445m(2 pills) x 2 days, then 2021m1 pill) x 3 days   . sucralfate (CARAFATE) 1 g tablet Take 1 tablet (1 g total) by mouth 4 (four) times daily.   . vitamin B-12 (CYANOCOBALAMIN) 1000 MCG tablet Take 2,000 mcg by mouth daily.  (Patient not taking: No sig reported)    No facility-administered encounter  medications on file as of 03/22/2020.    Patient Active Problem List   Diagnosis Date Noted  . Major depressive disorder, recurrent, moderate ([facility]CC[place]9/09/2019  . GAD (generalized anxiety disorder) 10/12/2019  . Chronic right shoulder pain 08/17/2018  . Absolute anemia 07/30/2018  . Primary osteoarthritis involving multiple joints 06/15/2018  . GERD (gastroesophageal reflux disease) 11/18/2016  . Diabetic retinopathy ([facility]/16/2018  . Hyperlipidemia associated with type 2 diabetes mellitus (HCC[place]0/16/2018  . Daytime sleepiness 07/15/2016  . Fatigue 07/15/2016  . Presbycusis of both ears 07/15/2016  . Tinnitus of right ear 07/15/2016  . Allergic rhinitis due to allergen 06/09/2016  . CKD (chronic kidney disease), stage III ([facility]CC[place]1/24/2018  . BPPV (benign paroxysmal positional vertigo), left 02/27/2016  . Chronic pansinusitis 02/27/2016  . Arthritis 07/03/2015  . Colon polyp 07/03/2015  . Essential hypertension 07/03/2015  . High cholesterol 07/03/2015  . Type 2 diabetes, controlled, with neuropathy (HCC[place]5/30/2017    Conditions to be addressed/monitored:HTN, HLD, DMII and Chronic Right shoulder pain  Care Plan : RNCM: Diabetes Type 2 (Adult)  Updates made by TatVanita Inglesnce 03/22/2020 12:00 AM    Problem: RNCM: Glycemic Management (Diabetes, Type 2)   Priority: Medium    Long-Range Goal: RNCM: Glycemic Management Optimized   Priority: Medium  Note:   Objective:  Lab Results  Component Value Date   HGBA1C 7.6 (H) 09/12/2019 .   Lab Results  Component Value Date   CREATININE 1.32 (H) 01/20/2020   CREATININE 1.30 (H) 10/05/2019   CREATININE 1.40 (H) 09/12/2019 .   . NMarland Kitchen results found for: EGFR Current Barriers:  . KMarland Kitchenowledge Deficits related to basic Diabetes pathophysiology and self care/management . Knowledge Deficits related to medications used for management of diabetes . Lacks social connections . Does not contact provider office for  questions/concerns Case Manager Clinical Goal(s):  . patient will demonstrate improved adherence to prescribed treatment plan for diabetes self care/management as evidenced by: daily monitoring and recording of CBG  adherence to ADA/ carb modified diet adherence to prescribed medication regimen contacting provider for new or worsened symptoms or questions Interventions:  . Collaboration with KarOlin HauserO regarding development and update of comprehensive plan of care as evidenced by provider attestation and co-signature . Inter-disciplinary care team collaboration (see longitudinal plan of care) . Provided education to patient about basic DM disease process. Discussed fasting  blood sugar goal <130 and post prandial of <180 . Reviewed medications with patient and discussed importance of medication adherence . Discussed plans with patient for ongoing care management follow up and provided patient with direct contact information for care management team . Provided patient with written educational materials related to hypo and hyperglycemia and importance of correct treatment . Advised patient, providing education and rationale, to check cbg bid and record, calling pcp for findings outside established parameters.   . Review of patient status, including review of consultants reports, relevant  laboratory and other test results, and medications completed. Patient Goals/Self-Care Activities - Self administers oral medications as prescribed Self administers insulin as prescribed Attends all scheduled provider appointments Checks blood sugars as prescribed and utilize hyper and hypoglycemia protocol as needed Adheres to prescribed ADA/carb modified - barriers to adherence to treatment plan identified - blood glucose monitoring encouraged - blood glucose readings reviewed - mutual A1C goal set or reviewed - resources required to improve adherence to care identified - self-awareness of  signs/symptoms of hypo or hyperglycemia encouraged - use of blood glucose monitoring log promoted Follow Up Plan: Telephone follow up appointment with care management team member scheduled for: 05-17-2020 at 230 pm   Task: RNCM: Alleviate Barriers to Glycemic Management   Note:   Care Management Activities:    - barriers to adherence to treatment plan identified - blood glucose monitoring encouraged - blood glucose readings reviewed - mutual A1C goal set or reviewed - resources required to improve adherence to care identified - self-awareness of signs/symptoms of hypo or hyperglycemia encouraged - use of blood glucose monitoring log promoted       Care Plan : RNCM: Chronic Pain (Adult)  Updates made by Vanita Ingles since 03/22/2020 12:00 AM    Problem: RNCM: Pain Management Plan (Chronic Pain)   Priority: High    Goal: RNCM: Pain Management Plan Developed   Priority: High  Note:   Current Barriers:  Marland Kitchen Knowledge Deficits related to managing acute/chronic pain . Non-adherence to scheduled provider appointments . Non-adherence to prescribed medication regimen . Difficulty obtaining medications . Chronic Disease Management support and education needs related to chronic pain . Lacks social connections . Does not contact provider office for questions/concerns Nurse Case Manager Clinical Goal(s):  . patient will verbalize understanding of plan for managing pain . patient will demonstrate use of different relaxation  skills and/or diversional activities to assist with pain reduction (distraction, imagery, relaxation, massage, acupressure, TENS, heat, and cold application . patient will report pain at a level less than 3 to 4 on a 10-10 rating scale . patient will use pharmacological and nonpharmacological pain relief strategies . patient will verbalize acceptable level of pain relief and ability to engage in desired activities . patient will engage in desired activities without an  increase in pain level Interventions:  . Collaboration with [name], DO regarding development and update of comprehensive plan of care as evidenced by provider attestation and co-signature . Inter-disciplinary care team collaboration (see longitudinal plan of care) . - deep breathing, relaxation and mindfulness use promoted . - effectiveness of pharmacologic therapy monitored . - medication-induced side effects managed . - misuse of pain medication assessed . - motivation and barriers to change assessed and addressed . - pain assessed . - pain treatment goals reviewed . - premedication prior to activity encouraged . Evaluation of current treatment plan related to pain to right shoulder and patient's adherence to plan as established by provider. . Advised patient to call the office for changes in pain or intensity of pain  . Provided education to patient re: monitoring for changes in level or intensity of pain and discomfort  . Reviewed medications with patient and discussed compliance. The patient is happy that the gabapentin is working well for his pain level and he sees a big improvement with his pain level  . Discussed plans with patient for ongoing care management follow up and provided patient with direct contact information for care management team . Allow patient to  maintain a diary of pain ratings, timing, precipitating events, medications, treatments, and what works best to relieve pain,  . Refer to support groups and self-help groups . Educate patient about the use of pharmacological interventions for pain management- antianxiety, antidepressants, NSAIDS, opioid analgesics,  . Explain the importance of lifestyle modifications to effective pain management  Patient Goals/Self Care Activities:  .  Marland Kitchen Self-administers medications as prescribed . Attends all scheduled provider appointments . Calls pharmacy for medication refills . Calls provider office for new concerns  or questions . - mutually acceptable comfort goal set . - pain assessed . - pain management plan developed . - pain treatment goals reviewed . - patient response to treatment assessed . - sharing of pain management plan with teachers and other caregivers encouraged Follow Up Plan: Telephone follow up appointment with care management team member scheduled for:05-17-2020     Task: RNCM: Partner to Develop Chronic Pain Management Plan   Note:   Care Management Activities:    - mutually acceptable comfort goal set - pain assessed - pain management plan developed - pain treatment goals reviewed - patient response to treatment assessed - sharing of pain management plan with teachers and other caregivers encouraged       Care Plan : RNCM: Hypertension (Adult)  Updates made by Vanita Ingles since 03/22/2020 12:00 AM    Problem: RNCM: Hypertension (Hypertension)   Priority: Medium    Long-Range Goal: RNCM: Hypertension Monitored   Priority: Medium  Note:   Objective:  . Last practice recorded BP readings:  BP Readings from Last 3 Encounters:  02/22/20 (!) 152/61  01/20/20 121/88  01/16/20 139/62 .   Marland Kitchen Most recent eGFR/CrCl: No results found for: EGFR  No components found for: CRCL Current Barriers:  Marland Kitchen Knowledge Deficits related to basic understanding of hypertension pathophysiology and self care management . Knowledge Deficits related to understanding of medications prescribed for management of hypertension . Lacks social connections . Does not contact provider office for questions/concerns Case Manager Clinical Goal(s):  Marland Kitchen Over the next 120 days, patient will verbalize understanding of plan for hypertension management . Over the next 120 days, patient will demonstrate improved adherence to prescribed treatment plan for hypertension as evidenced by taking all medications as prescribed, monitoring and recording blood pressure as directed, adhering to low sodium/DASH diet . Over  the next 120 days, patient will demonstrate improved health management independence as evidenced by checking blood pressure as directed and notifying PCP if SBP>160 or DBP > 90, taking all medications as prescribe, and adhering to a low sodium diet as discussed. . Over the next 120 days, patient will verbalize basic understanding of hypertension disease process and self health management plan as evidenced by hearth healthy diet compliance, medications compliance, working with CCM team to manage health and well being  Interventions:  . Collaboration with [name], DO regarding development and update of comprehensive plan of care as evidenced by provider attestation and co-signature . Inter-disciplinary care team collaboration (see longitudinal plan of care) . Evaluation of current treatment plan related to hypertension self management and patient's adherence to plan as established by provider. . Provided education to patient re: stroke prevention, s/s of heart attack and stroke, DASH diet, complications of uncontrolled blood pressure . Reviewed medications with patient and discussed importance of compliance . Discussed plans with patient for ongoing care management follow up and provided patient with direct contact information for care management team . Advised patient, providing education and  rationale, to monitor blood pressure daily and record, calling PCP for findings outside established parameters.  Patient Goals/Self-Care Activities . Over the next 120 days, patient will:  - Self administers medications as prescribed Attends all scheduled provider appointments Calls provider office for new concerns, questions, or BP outside discussed parameters Checks BP and records as discussed Follows a low sodium diet/DASH diet - blood pressure trends reviewed - depression screen reviewed - home or ambulatory blood pressure monitoring encouraged Follow Up Plan: Telephone follow up  appointment with care management team member scheduled for: 05-17-2020 at 230 pm   Task: RNCM: Identify and Monitor Blood Pressure Elevation   Note:   Care Management Activities:    - blood pressure trends reviewed - depression screen reviewed - home or ambulatory blood pressure monitoring encouraged     Care Plan : RNCM: HLD Management  Updates made by Vanita Ingles since 03/22/2020 12:00 AM    Problem: RNCM: HLD Management   Priority: Medium    Goal: RNCM: HLD Management   Note:   Current Barriers:  . Poorly controlled hyperlipidemia, complicated by HTN, DM, shoulder pain  . Current antihyperlipidemic regimen: Lipitor 40 mg QD . Most recent lipid panel:     Component Value Date/Time   CHOL 147 09/12/2019 0757   TRIG 125 09/12/2019 0757   HDL 51 09/12/2019 0757   CHOLHDL 2.9 09/12/2019 0757   LDLCALC 75 09/12/2019 0757 .   Marland Kitchen ASCVD risk enhancing conditions: age >25, DM, HTN, CKD,  former smoker . Lacks social connections . Does not contact provider office for questions/concerns  RN Care Manager Clinical Goal(s):  Marland Kitchen Over the next 120 days, patient will work with Consulting civil engineer, providers, and care team towards execution of optimized self-health management plan . patient will verbalize understanding of plan for HLD . patient will work with [facility] and pcp to address needs related to effective management of HLD   Interventions: . Collaboration with [name], DO regarding development and update of comprehensive plan of care as evidenced by provider attestation and co-signature . Inter-disciplinary care team collaboration (see longitudinal plan of care) . Medication review performed; medication list updated in electronic medical record.  Bertram Savin care team collaboration (see longitudinal plan of care) . Referred to pharmacy team for assistance with HLD medication management . Evaluation of current treatment plan related to HLD  and patient's  adherence to plan as established by provider. . Advised patient to call the office for changes or questions . Provided education to patient re: heart healthy/ADA diet, taking medications as prescribed and working with CCM team to optimize health and well being  . Discussed plans with patient for ongoing care management follow up and provided patient with direct contact information for care management team   Patient Goals/Self-Care Activities: . Over the next 120 days, patient will:   - call for medicine refill 2 or 3 days before it runs out - call if I am sick and can't take my medicine - keep a list of all the medicines I take; vitamins and herbals too - learn to read medicine labels - use a pillbox to sort medicine - use an alarm clock or phone to remind me to take my medicine - change to whole grain breads, cereal, pasta - drink 6 to 8 glasses of water each day - eat 3 to 5 servings of fruits and vegetables each day - eat 5 or 6 small meals each day - fill half the  plate with nonstarchy vegetables - limit fast food meals to no more than 1 per week - manage portion size - prepare main meal at home 3 to 5 days each week - read food labels for fat, fiber, carbohydrates and portion size - be open to making changes - I can manage, know and watch for signs of a heart attack - if I have chest pain, call for help - learn about small changes that will make a big difference - learn my personal risk factors  - barriers to meeting goals identified - change-talk evoked - choices provided - decision-making supported - health risks reviewed - problem-solving facilitated - questions answered - readiness for change evaluated - reassurance provided - self-reflection promoted - self-reliance encouraged  Follow Up Plan: Telephone follow up appointment with care management team member scheduled for:05-17-2020 at 230 pm      Task: RNCM: Mutually Develop and [place] of Patient Goals    Note:   Care Management Activities:    - barriers to meeting goals identified - change-talk evoked - choices provided - decision-making supported - health risks reviewed - problem-solving facilitated - questions answered - readiness for change evaluated - reassurance provided - self-reflection promoted - self-reliance encouraged         Plan:Telephone follow up appointment with care management team member scheduled for:  05-17-2020 at 230 pm   [place], MSN, [place] Mobile: [phone]

## 2020-03-30 DIAGNOSIS — M25511 Pain in right shoulder: Secondary | ICD-10-CM | POA: Diagnosis not present

## 2020-03-30 DIAGNOSIS — G5621 Lesion of ulnar nerve, right upper limb: Secondary | ICD-10-CM | POA: Diagnosis not present

## 2020-03-30 DIAGNOSIS — R202 Paresthesia of skin: Secondary | ICD-10-CM | POA: Diagnosis not present

## 2020-03-30 DIAGNOSIS — R200 Anesthesia of skin: Secondary | ICD-10-CM | POA: Diagnosis not present

## 2020-03-30 DIAGNOSIS — M542 Cervicalgia: Secondary | ICD-10-CM | POA: Diagnosis not present

## 2020-03-30 DIAGNOSIS — G629 Polyneuropathy, unspecified: Secondary | ICD-10-CM | POA: Diagnosis not present

## 2020-04-03 ENCOUNTER — Ambulatory Visit (INDEPENDENT_AMBULATORY_CARE_PROVIDER_SITE_OTHER): Payer: Medicare Other | Admitting: Licensed Clinical Social Worker

## 2020-04-03 DIAGNOSIS — E1140 Type 2 diabetes mellitus with diabetic neuropathy, unspecified: Secondary | ICD-10-CM | POA: Diagnosis not present

## 2020-04-03 DIAGNOSIS — I10 Essential (primary) hypertension: Secondary | ICD-10-CM

## 2020-04-03 DIAGNOSIS — E785 Hyperlipidemia, unspecified: Secondary | ICD-10-CM

## 2020-04-03 DIAGNOSIS — E1169 Type 2 diabetes mellitus with other specified complication: Secondary | ICD-10-CM

## 2020-04-03 DIAGNOSIS — F419 Anxiety disorder, unspecified: Secondary | ICD-10-CM

## 2020-04-03 DIAGNOSIS — F331 Major depressive disorder, recurrent, moderate: Secondary | ICD-10-CM

## 2020-04-03 NOTE — Patient Instructions (Signed)
Licensed Clinical Social Worker Visit Information  Goals we discussed today:  Goals Addressed            This Visit's Progress   . SW-Track and Manage My Symptoms-Depression       Timeframe:  Long-Range Goal Priority:  Medium Start Date:  04/03/20                       Expected End Date:  07/04/20                   Follow Up Date- 90 days from 05/23/20   - avoid negative self-talk - develop a personal safety plan - develop a plan to deal with triggers like holidays, anniversaries - exercise at least 2 to 3 times per week - have a plan for how to handle bad days - journal feelings and what helps to feel better or worse - spend time or talk with others at least 2 to 3 times per week - spend time or talk with others every day - watch for early signs of feeling worse - write in journal every day  - check out options for in-home help, long-term care or hospice - complete a living will - discuss my treatment options with the doctor or nurse - do one enjoyable thing every day - do something different, like talking to a new person or going to a new place, every day - learn something new by asking, reading and searching the Internet every day - make an audio or video recording for my loved ones - make shared treatment decisions with doctor - meditate daily - name a health care proxy (decision maker) - share memories using a picture album or scrapbook with my loved ones - spend time with a child every day, borrow one if I have to - spend time outdoors at least 3 times a week - strengthen or fix relationships with loved ones    Why is this important?    Keeping track of your progress will help your treatment team find the right mix of medicine and therapy for you.   Write in your journal every day.   Day-to-day changes in depression symptoms are normal. It may be more helpful to check your progress at the end of each week instead of every day.     Depression screen [facility] 2/9  01/16/2020 11/01/2019 10/12/2019  Decreased Interest 1 1 3   Down, Depressed, Hopeless 0 0 2  PHQ - 2 Score 1 1 5   Altered sleeping 0 1 3  Tired, decreased energy 1 1 2   Change in appetite 0 0 3  Feeling bad or failure about yourself  0 0 0  Trouble concentrating 0 0 0  Moving slowly or fidgety/restless 0 0 0  Suicidal thoughts 0 0 0  PHQ-9 Score 2 3 13   Difficult doing work/chores Not difficult at all Not difficult at all Somewhat difficult  Some recent data might be hidden    GAD 7 : Generalized Anxiety Score 01/16/2020 11/01/2019 10/12/2019 04/01/2018  Nervous, Anxious, on Edge 1 1 3 1   Control/stop worrying 1 1 2 2   Worry too much - different things 1 1 2 1   Trouble relaxing 0 0 1 1  Restless 0 0 1 1  Easily annoyed or irritable 1 0 3 1  Afraid - awful might happen 1 0 2 0  Total GAD 7 Score 5 3 14 7   Anxiety Difficulty Not difficult at  all Not difficult at all Somewhat difficult Somewhat difficult   Assessment, progress and current barriers:  Patient is currently experiencing symptoms of  anxiety and depression which seems to be exacerbated by his pain and insomnia.   Clinical Goal(s): Over the next 120 days, patient will work with SW to reduce or manage symptoms of anxiety, depression, insomnia, and mood instability until connected for ongoing counseling.  Over the next 120 days, patient will work with SW to address concerns related to gaining healthy coping skills and resource connection in order to improve mental health Clinical Interventions:  . Assessed thoughts of SI, plan and access to means. Assessed patient's previous treatment, needs, coping skills, current treatment, support system and barriers to care . Patient interviewed and appropriate assessments performed: brief mental health assessment . Provided basic mental health support, education and interventions . Discussed several options for long term counseling based on need and insurance. Assisted patient with narrowing the  options down to [facility] or [facility] but patient declined needing mental health resource implementation at this time. Patient lost his son over 2 years ago and is still experiencing signs of grief. However, he also denies needing a grief therapy referral at this time as he reports he is able to manage his grief on his own, in his own way.  . Reviewed mental health medications with patient prescribed by PCP and discussed compliance. Patient reports that his Buspar medication is effectively working for him. He reports that he gets all of his medications delivered directly to his residence.  . Patient was prescribed gabapentin during his office visit with PCP yesterday to help with his chronic pain and insomnia.  . Assisted patient/caregiver with obtaining information about health plan benefits . Provided education and assistance to client regarding Advanced Directives. . Provided education to patient/caregiver about Hospice and/or Palliative Care services . Patient denies wanting to pursue the [place] and Attendance program in order to increase his level of support in the home. He reports that his daughter resides with him for assistance but that he is able to do most things on his own for himself.  . Patient reports that he spends a lot of time outside in his garden.   Follow Up Plan:  SW will follow up with patient by phone over the next quarter  Notes: Patient reports that the [place] mailed him a bike that he uses every morning to get exercise. He reports that he completes several push up's in the morning as well. Patient went to the grocery store this morning by himself to get food. Patient remains active.

## 2020-04-03 NOTE — Chronic Care Management (AMB) (Signed)
Chronic Care Management    Clinical Social Work Note  04/03/2020 Name: Charles York MRN: [id number] DOB: 1930-09-29  Charles York is a 87 y.o. year old male who is a primary care patient of [name], DO. The CCM team was consulted to assist the patient with chronic disease management and/or care coordination needs related to: Grief Counseling.   Engaged with patient by telephone for follow up visit in response to provider referral for social work chronic care management and care coordination services.   Consent to Services:  The patient was given the following information about Chronic Care Management services today, agreed to services, and gave verbal consent: 1. CCM service includes personalized support from designated clinical staff supervised by the primary care provider, including individualized plan of care and coordination with other care providers 2. 24/7 contact phone numbers for assistance for urgent and routine care needs. 3. Service will only be billed when office clinical staff spend 20 minutes or more in a month to coordinate care. 4. Only one practitioner may furnish and bill the service in a calendar month. 5.The patient may stop CCM services at any time (effective at the end of the month) by phone call to the office staff. 6. The patient will be responsible for cost sharing (co-pay) of up to 20% of the service fee (after annual deductible is met). Patient agreed to services and consent obtained.  Patient agreed to services and consent obtained.   Assessment: Review of patient past medical history, allergies, medications, and health status, including review of relevant consultants reports was performed today as part of a comprehensive evaluation and provision of chronic care management and care coordination services.     SDOH (Social Determinants of Health) assessments and interventions performed:    Advanced Directives Status: Not addressed in this  encounter.  CCM Care Plan  Allergies  Allergen Reactions  . Trulicity [Dulaglutide] Swelling    Swollen lips, shortness of breath,   . Buspirone     Dizziness  . Penicillins Itching and Rash    Has patient had a PCN reaction causing immediate rash, facial/tongue/throat swelling, SOB or lightheadedness with hypotension: No Has patient had a PCN reaction causing severe rash involving mucus membranes or skin necrosis: No Has patient had a PCN reaction that required hospitalization: No Has patient had a PCN reaction occurring within the last 10 years: No If all of the above answers are "NO", then may proceed with Cephalosporin use.     Outpatient Encounter Medications as of 04/03/2020  Medication Sig Note  . aspirin 325 MG EC tablet TK 1 T PO QD FOR UP TO 90 DAYS THEN CAN REDUCE TO 81 MG DAILY   . atorvastatin (LIPITOR) 40 MG tablet Take 1 tablet (40 mg total) by mouth at bedtime.   . ferrous sulfate 325 (65 FE) MG tablet Take 325 mg by mouth every other day.   . gabapentin (NEURONTIN) 100 MG capsule Take 1 cap in morning, 1 cap in afternoon, and 3 caps at night.   Marland Kitchen HUMALOG 100 UNIT/ML injection ADMINISTER 12 UNITS UNDER THE SKIN TWICE DAILY WITH A MEAL   . HUMALOG KWIKPEN 100 UNIT/ML KwikPen Inject 0.12 mLs (12 Units total) into the skin 2 (two) times daily with a meal. Up to 50 units/day as directed by MD   . hydroxypropyl methylcellulose / hypromellose (ISOPTO TEARS / GONIOVISC) 2.5 % ophthalmic solution Place 1 drop into both eyes 4 (four) times daily.   . insulin  aspart protamine - aspart (NOVOLOG 70/30 MIX) (70-30) 100 UNIT/ML FlexPen Inject 12 Units into the skin 2 (two) times daily. In the morning and evening   . ipratropium (ATROVENT) 0.06 % nasal spray USE 2 SPRAYS IN EACH NOSTRIL FOUR TIMES DAILY FOR UP TO 5 TO 7 DAYS THEN STOP 12/29/2018: Dropped to twice daily due to excessive dryness.  Marland Kitchen LANTUS 100 UNIT/ML injection INJECT 27 UNITS SUBCUTANEOUSLY BEFORE BREAKFAST   . losartan  (COZAAR) 25 MG tablet TAKE 1 TABLET(25 MG) BY MOUTH DAILY   . omeprazole (PRILOSEC) 20 MG capsule TAKE 1 CAPSULE(20 MG) BY MOUTH TWICE DAILY BEFORE A MEAL (Patient not taking: No sig reported)   . polyethylene glycol powder (GLYCOLAX/MIRALAX) 17 GM/SCOOP powder Take 17-34 g by mouth 2 (two) times daily as needed for mild constipation or moderate constipation. (Patient not taking: No sig reported)   . predniSONE (DELTASONE) 20 MG tablet Take daily with food. Start with 71m (3 pills) x 2 days, then reduce to 4108m(2 pills) x 2 days, then 2046m1 pill) x 3 days   . sucralfate (CARAFATE) 1 g tablet Take 1 tablet (1 g total) by mouth 4 (four) times daily.   . vitamin B-12 (CYANOCOBALAMIN) 1000 MCG tablet Take 2,000 mcg by mouth daily.  (Patient not taking: No sig reported)    No facility-administered encounter medications on file as of 04/03/2020.    Patient Active Problem List   Diagnosis Date Noted  . Major depressive disorder, recurrent, moderate (HCCSpencer9/09/2019  . GAD (generalized anxiety disorder) 10/12/2019  . Chronic right shoulder pain 08/17/2018  . Absolute anemia 07/30/2018  . Primary osteoarthritis involving multiple joints 06/15/2018  . GERD (gastroesophageal reflux disease) 11/18/2016  . Diabetic retinopathy (HCCSandoval0/16/2018  . Hyperlipidemia associated with type 2 diabetes mellitus (HCCGalt0/16/2018  . Daytime sleepiness 07/15/2016  . Fatigue 07/15/2016  . Presbycusis of both ears 07/15/2016  . Tinnitus of right ear 07/15/2016  . Allergic rhinitis due to allergen 06/09/2016  . CKD (chronic kidney disease), stage III (HCCWhite Marsh1/24/2018  . BPPV (benign paroxysmal positional vertigo), left 02/27/2016  . Chronic pansinusitis 02/27/2016  . Arthritis 07/03/2015  . Colon polyp 07/03/2015  . Essential hypertension 07/03/2015  . High cholesterol 07/03/2015  . Type 2 diabetes, controlled, with neuropathy (HCCSweet Grass5/30/2017    Conditions to be addressed/monitored: Grief; Mental Health  Concerns   Care Plan : General Social Work (Adult)  Updates made by JoyGreg CutterCSW since 04/03/2020 12:00 AM    Problem: Coping Skills (General Plan of Care)     Long-Range Goal: Coping Skills Enhanced   Start Date: 04/03/2020  Priority: Medium  Note:   Timeframe:  Long-Range Goal Priority:  Medium Start Date:  04/03/20                       Expected End Date:  07/04/20                   Follow Up Date- 90 days from 05/23/20   - avoid negative self-talk - develop a personal safety plan - develop a plan to deal with triggers like holidays, anniversaries - exercise at least 2 to 3 times per week - have a plan for how to handle bad days - journal feelings and what helps to feel better or worse - spend time or talk with others at least 2 to 3 times per week - spend time or talk with others every day -  watch for early signs of feeling worse - write in journal every day  - check out options for in-home help, long-term care or hospice - complete a living will - discuss my treatment options with the doctor or nurse - do one enjoyable thing every day - do something different, like talking to a new person or going to a new place, every day - learn something new by asking, reading and searching the Internet every day - make an audio or video recording for my loved ones - make shared treatment decisions with doctor - meditate daily - name a health care proxy (decision maker) - share memories using a picture album or scrapbook with my loved ones - spend time with a child every day, borrow one if I have to - spend time outdoors at least 3 times a week - strengthen or fix relationships with loved ones    Why is this important?    Keeping track of your progress will help your treatment team find the right mix of medicine and therapy for you.   Write in your journal every day.   Day-to-day changes in depression symptoms are normal. It may be more helpful to check your progress at the  end of each week instead of every day.     Depression screen [facility] 2/9 01/16/2020 11/01/2019 10/12/2019  Decreased Interest _0 Down, Depressed, Hopeless 0 0 2  PHQ - 2 Score _1 Altered sleeping 0 1 3  Tired, decreased energy _2 Change in appetite 0 0 3  Feeling bad or failure about yourself  0 0 0  Trouble concentrating 0 0 0  Moving slowly or fidgety/restless 0 0 0  Suicidal thoughts 0 0 0  PHQ-9 Score _3 Difficult doing work/chores Not difficult at all Not difficult at all Somewhat difficult  Some recent data might be hidden    GAD 7 : Generalized Anxiety Score 01/16/2020 11/01/2019 10/12/2019 04/01/2018  Nervous, Anxious, on Edge _4 Control/stop worrying _5 Worry too much - different things _6 Trouble relaxing 0 0 1 1  Restless 0 0 1 1  Easily annoyed or irritable 1 0 3 1  Afraid - awful might happen 1 0 2 0  Total GAD 7 Score _7 Anxiety Difficulty Not difficult at all Not difficult at all Somewhat difficult Somewhat difficult   Assessment, progress and current barriers:  Patient is currently experiencing symptoms of  anxiety and depression which seems to be exacerbated by his pain and insomnia.   Clinical Goal(s): Over the next 120 days, patient will work with SW to reduce or manage symptoms of anxiety, depression, insomnia, and mood instability until connected for ongoing counseling.  Over the next 120 days, patient will work with SW to address concerns related to gaining healthy coping skills and resource connection in order to improve mental health Clinical Interventions:  . Assessed thoughts of SI, plan and access to means. Assessed patient's previous treatment, needs, coping skills, current treatment, support system and barriers to care . Patient interviewed and appropriate assessments performed: brief mental health assessment . Provided basic mental health support, education and interventions . Discussed several options for long term  counseling based on need and insurance. Assisted patient with narrowing the options down to [facility] or [facility] but patient declined needing mental health resource implementation at this time. Patient lost  his son over 2 years ago and is still experiencing signs of grief. However, he also denies needing a grief therapy referral at this time as he reports he is able to manage his grief on his own, in his own way.  . Reviewed mental health medications with patient prescribed by PCP and discussed compliance. Patient reports that his Buspar medication is effectively working for him. He reports that he gets all of his medications delivered directly to his residence.  . Patient was prescribed gabapentin during his office visit with PCP yesterday to help with his chronic pain and insomnia.  . Assisted patient/caregiver with obtaining information about health plan benefits . Provided education and assistance to client regarding Advanced Directives. . Provided education to patient/caregiver about Hospice and/or Palliative Care services . Patient denies wanting to pursue the [place] and Attendance program in order to increase his level of support in the home. He reports that his daughter resides with him for assistance but that he is able to do most things on his own for himself.  . Patient reports that he spends a lot of time outside in his garden.   Follow Up Plan:  SW will follow up with patient by phone over the next quarter  Notes: Patient reports that the [place] mailed him a bike that he uses every morning to get exercise. He reports that he completes several push up's in the morning as well. Patient went to the grocery store this morning by himself to get food. Patient remains active.    Task: Support Psychosocial Response to Risk or Actual Health Condition   Note:   Care Management Activities:    - active listening utilized - counseling provided - current coping strategies identified -  decision-making supported - healthy lifestyle promoted - journaling promoted - meditative movement therapy encouraged - mindfulness encouraged - participation in counseling encouraged - problem-solving facilitated - relaxation techniques promoted - self-reflection promoted - spiritual activities promoted - verbalization of feelings encouraged    Notes:       Follow Up Plan: SW will follow up with patient by phone over the next quarter      Eula Fried, [place], MSW, [email or web address] Phone: [phone]

## 2020-04-12 ENCOUNTER — Telehealth: Payer: Self-pay

## 2020-04-12 NOTE — Telephone Encounter (Signed)
The pt was notified of Dr. Raliegh Ip recommendation. He verbalize understanding, questions or concerns.

## 2020-04-12 NOTE — Telephone Encounter (Signed)
Copied from [place] [id number]. Topic: General - Other >> Apr 12, 2020  9:47 AM Antonieta Iba C wrote: Reason for CRM: pt called in to request a Rx for a diabetic patch. Pt says that it register his readings by using a smart phone, pt says that he feels that this would be more helpful to him instead of having to prick his finger.   Pharmacy: [id number] - [place], [address]  Phone:  [phone] Fax:  [phone]

## 2020-04-12 NOTE — Telephone Encounter (Signed)
Please notify patient that most of the time those Diabetic Testing Sensors that does not require a finger stick are often not covered by insurance and usually will be expensive.  From review of his chart. I believe his [place] doctor prescribes his insulin, and they may be able to get this Diabetic Sensor device approved for him through the [place].  I would ask that he contact his doctor at [place] first.  If they cannot do it, then he would need a follow-up apt with me to re-check his A1c before we would be eligible to attempt to order this.  Nobie Putnam, [facility] 04/12/2020, 11:56 AM

## 2020-05-17 ENCOUNTER — Telehealth: Payer: Medicare Other | Admitting: General Practice

## 2020-05-17 ENCOUNTER — Ambulatory Visit (INDEPENDENT_AMBULATORY_CARE_PROVIDER_SITE_OTHER): Payer: Medicare Other | Admitting: General Practice

## 2020-05-17 DIAGNOSIS — E785 Hyperlipidemia, unspecified: Secondary | ICD-10-CM | POA: Diagnosis not present

## 2020-05-17 DIAGNOSIS — E1169 Type 2 diabetes mellitus with other specified complication: Secondary | ICD-10-CM

## 2020-05-17 DIAGNOSIS — I10 Essential (primary) hypertension: Secondary | ICD-10-CM

## 2020-05-17 DIAGNOSIS — G8929 Other chronic pain: Secondary | ICD-10-CM

## 2020-05-17 DIAGNOSIS — E1140 Type 2 diabetes mellitus with diabetic neuropathy, unspecified: Secondary | ICD-10-CM | POA: Diagnosis not present

## 2020-05-17 NOTE — Chronic Care Management (AMB) (Signed)
Chronic Care Management   CCM RN Visit Note  05/17/2020 Name: Charles York MRN: [id number] DOB: [birth year]  Subjective: Charles York is a 87 y.o. year old male who is a primary care patient of [name], DO. The care management team was consulted for assistance with disease management and care coordination needs.    Engaged with patient by telephone for follow up visit in response to provider referral for case management and/or care coordination services.   Consent to Services:  The patient was given information about Chronic Care Management services, agreed to services, and gave verbal consent prior to initiation of services.  Please see initial visit note for detailed documentation.   Patient agreed to services and verbal consent obtained.   Assessment: Review of patient past medical history, allergies, medications, health status, including review of consultants reports, laboratory and other test data, was performed as part of comprehensive evaluation and provision of chronic care management services.   SDOH (Social Determinants of Health) assessments and interventions performed:    CCM Care Plan  Allergies  Allergen Reactions  . Trulicity [Dulaglutide] Swelling    Swollen lips, shortness of breath,   . Buspirone     Dizziness  . Penicillins Itching and Rash    Has patient had a PCN reaction causing immediate rash, facial/tongue/throat swelling, SOB or lightheadedness with hypotension: No Has patient had a PCN reaction causing severe rash involving mucus membranes or skin necrosis: No Has patient had a PCN reaction that required hospitalization: No Has patient had a PCN reaction occurring within the last 10 years: No If all of the above answers are "NO", then may proceed with Cephalosporin use.     Outpatient Encounter Medications as of 05/17/2020  Medication Sig Note  . aspirin 325 MG EC tablet TK 1 T PO QD FOR UP TO 90 DAYS THEN CAN REDUCE TO 81 MG DAILY   .  atorvastatin (LIPITOR) 40 MG tablet Take 1 tablet (40 mg total) by mouth at bedtime.   . ferrous sulfate 325 (65 FE) MG tablet Take 325 mg by mouth every other day.   . gabapentin (NEURONTIN) 100 MG capsule Take 1 cap in morning, 1 cap in afternoon, and 3 caps at night.   Marland Kitchen HUMALOG 100 UNIT/ML injection ADMINISTER 12 UNITS UNDER THE SKIN TWICE DAILY WITH A MEAL   . HUMALOG KWIKPEN 100 UNIT/ML KwikPen Inject 0.12 mLs (12 Units total) into the skin 2 (two) times daily with a meal. Up to 50 units/day as directed by MD   . hydroxypropyl methylcellulose / hypromellose (ISOPTO TEARS / GONIOVISC) 2.5 % ophthalmic solution Place 1 drop into both eyes 4 (four) times daily.   . insulin aspart protamine - aspart (NOVOLOG 70/30 MIX) (70-30) 100 UNIT/ML FlexPen Inject 12 Units into the skin 2 (two) times daily. In the morning and evening   . ipratropium (ATROVENT) 0.06 % nasal spray USE 2 SPRAYS IN EACH NOSTRIL FOUR TIMES DAILY FOR UP TO 5 TO 7 DAYS THEN STOP 12/29/2018: Dropped to twice daily due to excessive dryness.  Marland Kitchen LANTUS 100 UNIT/ML injection INJECT 27 UNITS SUBCUTANEOUSLY BEFORE BREAKFAST   . losartan (COZAAR) 25 MG tablet TAKE 1 TABLET(25 MG) BY MOUTH DAILY   . omeprazole (PRILOSEC) 20 MG capsule TAKE 1 CAPSULE(20 MG) BY MOUTH TWICE DAILY BEFORE A MEAL (Patient not taking: No sig reported)   . polyethylene glycol powder (GLYCOLAX/MIRALAX) 17 GM/SCOOP powder Take 17-34 g by mouth 2 (two) times daily as needed for  mild constipation or moderate constipation. (Patient not taking: No sig reported)   . sucralfate (CARAFATE) 1 g tablet Take 1 tablet (1 g total) by mouth 4 (four) times daily.   . vitamin B-12 (CYANOCOBALAMIN) 1000 MCG tablet Take 2,000 mcg by mouth daily.  (Patient not taking: No sig reported)    No facility-administered encounter medications on file as of 05/17/2020.    Patient Active Problem List   Diagnosis Date Noted  . Major depressive disorder, recurrent, moderate ([place]) 10/12/2019  .  GAD (generalized anxiety disorder) 10/12/2019  . Chronic right shoulder pain 08/17/2018  . Absolute anemia 07/30/2018  . Primary osteoarthritis involving multiple joints 06/15/2018  . GERD (gastroesophageal reflux disease) 11/18/2016  . Diabetic retinopathy ([place]) 11/18/2016  . Hyperlipidemia associated with type 2 diabetes mellitus ([place]) 11/18/2016  . Daytime sleepiness 07/15/2016  . Fatigue 07/15/2016  . Presbycusis of both ears 07/15/2016  . Tinnitus of right ear 07/15/2016  . Allergic rhinitis due to allergen 06/09/2016  . CKD (chronic kidney disease), stage III ([place]) 02/27/2016  . BPPV (benign paroxysmal positional vertigo), left 02/27/2016  . Chronic pansinusitis 02/27/2016  . Arthritis 07/03/2015  . Colon polyp 07/03/2015  . Essential hypertension 07/03/2015  . High cholesterol 07/03/2015  . Type 2 diabetes, controlled, with neuropathy ([place]) 07/03/2015    Conditions to be addressed/monitored:HTN, HLD, DMII and chronic pain and discomfort  Care Plan : RNCM: Diabetes Type 2 (Adult)  Updates made by Vanita Ingles since 05/17/2020 12:00 AM    Problem: RNCM: Glycemic Management (Diabetes, Type 2)   Priority: Medium    Long-Range Goal: RNCM: Glycemic Management Optimized   Priority: Medium  Note:   Objective:  . Lab Results .  Component . Value . Date .   Marland Kitchen HGBA1C . 7.6 (H) . 09/12/2019 .    Lab Results  Component Value Date   CREATININE 1.32 (H) 01/20/2020   CREATININE 1.30 (H) 10/05/2019   CREATININE 1.40 (H) 09/12/2019 .   Marland Kitchen No results found for: EGFR Current Barriers:  Marland Kitchen Knowledge Deficits related to basic Diabetes pathophysiology and self care/management . Knowledge Deficits related to medications used for management of diabetes . Lacks social connections . Does not contact provider office for questions/concerns Case Manager Clinical Goal(s):  . patient will demonstrate improved adherence to prescribed treatment plan for diabetes self care/management as  evidenced by: daily monitoring and recording of CBG  adherence to ADA/ carb modified diet adherence to prescribed medication regimen contacting provider for new or worsened symptoms or questions Interventions:  . Collaboration with [name], DO regarding development and update of comprehensive plan of care as evidenced by provider attestation and co-signature . Inter-disciplinary care team collaboration (see longitudinal plan of care) . Provided education to patient about basic DM disease process. Discussed fasting  blood sugar goal <130 and post prandial of <180 . Reviewed medications with patient and discussed importance of medication adherence. 05-17-2020: The patient is compliant with medications. The patient denies any medication needs. . Discussed plans with patient for ongoing care management follow up and provided patient with direct contact information for care management team . Provided patient with written educational materials related to hypo and hyperglycemia and importance of correct treatment . Advised patient, providing education and rationale, to check cbg bid and record, calling pcp for findings outside established parameters.  05-17-2020: The patient states that his blood sugars fasting are 130-140 consistently.  . Review of patient status, including review of consultants reports, relevant laboratory and  other test results, and medications completed. Patient Goals/Self-Care Activities - Self administers oral medications as prescribed Self administers insulin as prescribed Attends all scheduled provider appointments Checks blood sugars as prescribed and utilize hyper and hypoglycemia protocol as needed Adheres to prescribed ADA/carb modified - barriers to adherence to treatment plan identified - blood glucose monitoring encouraged - blood glucose readings reviewed - mutual A1C goal set or reviewed - resources required to improve adherence to care identified -  self-awareness of signs/symptoms of hypo or hyperglycemia encouraged - use of blood glucose monitoring log promoted Follow Up Plan: Telephone follow up appointment with care management team member scheduled for: 05-17-2020 at 230 pm   Care Plan : RNCM: Chronic Pain (Adult)  Updates made by Vanita Ingles since 05/17/2020 12:00 AM    Problem: RNCM: Pain Management Plan (Chronic Pain)   Priority: High    Goal: RNCM: Pain Management Plan Developed Completed 05/17/2020  Priority: High  Note:   Current Barriers: Closing this goal. The patient denies pain or discomfort. The gabapentin is working well for the patient at this time.  . Knowledge Deficits related to managing acute/chronic pain . Non-adherence to scheduled provider appointments . Non-adherence to prescribed medication regimen . Difficulty obtaining medications . Chronic Disease Management support and education needs related to chronic pain . Lacks social connections . Does not contact provider office for questions/concerns Nurse Case Manager Clinical Goal(s):  . patient will verbalize understanding of plan for managing pain . patient will demonstrate use of different relaxation  skills and/or diversional activities to assist with pain reduction (distraction, imagery, relaxation, massage, acupressure, TENS, heat, and cold application . patient will report pain at a level less than 3 to 4 on a 10-10 rating scale . patient will use pharmacological and nonpharmacological pain relief strategies . patient will verbalize acceptable level of pain relief and ability to engage in desired activities . patient will engage in desired activities without an increase in pain level Interventions:  . Collaboration with [name], DO regarding development and update of comprehensive plan of care as evidenced by provider attestation and co-signature . Inter-disciplinary care team collaboration (see longitudinal plan of care) . - deep  breathing, relaxation and mindfulness use promoted . - effectiveness of pharmacologic therapy monitored . - medication-induced side effects managed . - misuse of pain medication assessed . - motivation and barriers to change assessed and addressed . - pain assessed . - pain treatment goals reviewed . - premedication prior to activity encouraged . Evaluation of current treatment plan related to pain to right shoulder and patient's adherence to plan as established by provider. . Advised patient to call the office for changes in pain or intensity of pain  . Provided education to patient re: monitoring for changes in level or intensity of pain and discomfort  . Reviewed medications with patient and discussed compliance. The patient is happy that the gabapentin is working well for his pain level and he sees a big improvement with his pain level  . Discussed plans with patient for ongoing care management follow up and provided patient with direct contact information for care management team . Allow patient to maintain a diary of pain ratings, timing, precipitating events, medications, treatments, and what works best to relieve pain,  . Refer to support groups and self-help groups . Educate patient about the use of pharmacological interventions for pain management- antianxiety, antidepressants, NSAIDS, opioid analgesics,  . Explain the importance of lifestyle modifications to effective pain management  Patient Goals/Self Care Activities:  .  Marland Kitchen Self-administers medications as prescribed . Attends all scheduled provider appointments . Calls pharmacy for medication refills . Calls provider office for new concerns or questions . - mutually acceptable comfort goal set . - pain assessed . - pain management plan developed . - pain treatment goals reviewed . - patient response to treatment assessed . - sharing of pain management plan with teachers and other caregivers encouraged Follow Up Plan:  Telephone follow up appointment with care management team member scheduled for: 08-02-2020     Task: RNCM: Partner to Develop Chronic Pain Management Plan Completed 05/17/2020  Outcome: Positive  Note:   Care Management Activities:    - mutually acceptable comfort goal set - pain assessed - pain management plan developed - pain treatment goals reviewed - patient response to treatment assessed - sharing of pain management plan with teachers and other caregivers encouraged       Care Plan : RNCM: Hypertension (Adult)  Updates made by Vanita Ingles since 05/17/2020 12:00 AM    Problem: RNCM: Hypertension (Hypertension)   Priority: Medium    Long-Range Goal: RNCM: Hypertension Monitored   Priority: Medium  Note:   Objective:  . Last practice recorded BP readings:  BP Readings from Last 3 Encounters:  02/22/20 (!) 152/61  01/20/20 121/88  01/16/20 139/62 .   Marland Kitchen Most recent eGFR/CrCl: No results found for: EGFR  No components found for: CRCL Current Barriers:  Marland Kitchen Knowledge Deficits related to basic understanding of hypertension pathophysiology and self care management . Knowledge Deficits related to understanding of medications prescribed for management of hypertension . Lacks social connections . Does not contact provider office for questions/concerns Case Manager Clinical Goal(s):  Marland Kitchen Over the next 120 days, patient will verbalize understanding of plan for hypertension management . Over the next 120 days, patient will demonstrate improved adherence to prescribed treatment plan for hypertension as evidenced by taking all medications as prescribed, monitoring and recording blood pressure as directed, adhering to low sodium/DASH diet . Over the next 120 days, patient will demonstrate improved health management independence as evidenced by checking blood pressure as directed and notifying PCP if SBP>160 or DBP > 90, taking all medications as prescribe, and adhering to a low sodium diet  as discussed. . Over the next 120 days, patient will verbalize basic understanding of hypertension disease process and self health management plan as evidenced by hearth healthy diet compliance, medications compliance, working with CCM team to manage health and well being  Interventions:  . Collaboration with [name], DO regarding development and update of comprehensive plan of care as evidenced by provider attestation and co-signature . Inter-disciplinary care team collaboration (see longitudinal plan of care) . Evaluation of current treatment plan related to hypertension self management and patient's adherence to plan as established by provider. . Provided education to patient re: stroke prevention, s/s of heart attack and stroke, DASH diet, complications of uncontrolled blood pressure . Reviewed medications with patient and discussed importance of compliance. 05-17-2020: The patient states that he is compliant with medications . Discussed plans with patient for ongoing care management follow up and provided patient with direct contact information for care management team.  . Advised patient, providing education and rationale, to monitor blood pressure daily and record, calling PCP for findings outside established parameters.  Patient Goals/Self-Care Activities . Over the next 120 days, patient will:  - Self administers medications as prescribed Attends all scheduled provider appointments Calls provider  office for new concerns, questions, or BP outside discussed parameters Checks BP and records as discussed Follows a low sodium diet/DASH diet - blood pressure trends reviewed - depression screen reviewed - home or ambulatory blood pressure monitoring encouraged Follow Up Plan: Telephone follow up appointment with care management team member scheduled for: 08-02-2020 at 230 pm   Care Plan : RNCM: HLD Management  Updates made by Vanita Ingles since 05/17/2020 12:00 AM     Problem: RNCM: HLD Management   Priority: Medium    Long-Range Goal: RNCM: HLD Management   Priority: Medium  Note:   Current Barriers:  . Poorly controlled hyperlipidemia, complicated by HTN, DM, shoulder pain  . Current antihyperlipidemic regimen: Lipitor 40 mg QD . Most recent lipid panel:     Component Value Date/Time   CHOL 147 09/12/2019 0757   TRIG 125 09/12/2019 0757   HDL 51 09/12/2019 0757   CHOLHDL 2.9 09/12/2019 0757   LDLCALC 75 09/12/2019 0757 .   Marland Kitchen ASCVD risk enhancing conditions: age >86, DM, HTN, CKD,  former smoker . Lacks social connections . Does not contact provider office for questions/concerns  RN Care Manager Clinical Goal(s):  Marland Kitchen Over the next 120 days, patient will work with Consulting civil engineer, providers, and care team towards execution of optimized self-health management plan . patient will verbalize understanding of plan for HLD . patient will work with [facility] and pcp to address needs related to effective management of HLD   Interventions: . Collaboration with [name], DO regarding development and update of comprehensive plan of care as evidenced by provider attestation and co-signature . Inter-disciplinary care team collaboration (see longitudinal plan of care) . Medication review performed; medication list updated in electronic medical record.  Bertram Savin care team collaboration (see longitudinal plan of care) . Referred to pharmacy team for assistance with HLD medication management . Evaluation of current treatment plan related to HLD  and patient's adherence to plan as established by provider. 05-17-2020: The patient denies any issues at this time. Is compliant with plan of care. . Advised patient to call the office for changes or questions . Provided education to patient re: heart healthy/ADA diet, taking medications as prescribed and working with CCM team to optimize health and well being. Education on activity level and  safety.  . Discussed plans with patient for ongoing care management follow up and provided patient with direct contact information for care management team   Patient Goals/Self-Care Activities: . Over the next 120 days, patient will:   - call for medicine refill 2 or 3 days before it runs out - call if I am sick and can't take my medicine - keep a list of all the medicines I take; vitamins and herbals too - learn to read medicine labels - use a pillbox to sort medicine - use an alarm clock or phone to remind me to take my medicine - change to whole grain breads, cereal, pasta - drink 6 to 8 glasses of water each day - eat 3 to 5 servings of fruits and vegetables each day - eat 5 or 6 small meals each day - fill half the plate with nonstarchy vegetables - limit fast food meals to no more than 1 per week - manage portion size - prepare main meal at home 3 to 5 days each week - read food labels for fat, fiber, carbohydrates and portion size - be open to making changes - I can manage, know and watch  for signs of a heart attack - if I have chest pain, call for help - learn about small changes that will make a big difference - learn my personal risk factors  - barriers to meeting goals identified - change-talk evoked - choices provided - decision-making supported - health risks reviewed - problem-solving facilitated - questions answered - readiness for change evaluated - reassurance provided - self-reflection promoted - self-reliance encouraged  Follow Up Plan: Telephone follow up appointment with care management team member scheduled for:08-02-2020 at 230 pm        Plan:Telephone follow up appointment with care management team member scheduled for:  07-26-2020 at 230 pm  [place], MSN, [place] Mobile: [phone]

## 2020-05-17 NOTE — Patient Instructions (Signed)
Visit Information  PATIENT GOALS: Goals Addressed            This Visit's Progress   . COMPLETED: Increase water intake       Recommend eliminating [employer] and replacing with 4-6 glasses of water per day    . COMPLETED: Patient Stated       09/06/2019, wants to get to 170 pounds    . COMPLETED: RNCM: Manage Chronic Pain       Timeframe:  Short-Term Goal Priority:  High Start Date:     03-22-2020                        Expected End Date:      07-02-2020                 Follow Up Date 05-17-2020   - call for medicine refill 2 or 3 days before it runs out - develop a personal pain management plan - keep track of prescription refills - plan exercise or activity when pain is best controlled - prioritize tasks for the day - track times pain is worst and when it is best - track what makes the pain worse and what makes it better - use ice or heat for pain relief - work slower and less intense when having pain    Why is this important?    Day-to-day life can be hard when you have chronic pain.   Pain medicine is just one piece of the treatment puzzle.   You can try these action steps to help you manage your pain.    Notes: gabapentin is working well for pain at this time. 05-17-2020: Denies any pain at this time. Closing this goal       Patient Care Plan: RNCM: Diabetes Type 2 (Adult)    Problem Identified: RNCM: Glycemic Management (Diabetes, Type 2)   Priority: Medium    Long-Range Goal: RNCM: Glycemic Management Optimized   Priority: Medium  Note:   Objective:  . Lab Results .  Component . Value . Date .   Marland Kitchen HGBA1C . 7.6 (H) . 09/12/2019 .    Lab Results  Component Value Date   CREATININE 1.32 (H) 01/20/2020   CREATININE 1.30 (H) 10/05/2019   CREATININE 1.40 (H) 09/12/2019 .   Marland Kitchen No results found for: EGFR Current Barriers:  Marland Kitchen Knowledge Deficits related to basic Diabetes pathophysiology and self care/management . Knowledge Deficits related to medications used  for management of diabetes . Lacks social connections . Does not contact provider office for questions/concerns Case Manager Clinical Goal(s):  . patient will demonstrate improved adherence to prescribed treatment plan for diabetes self care/management as evidenced by: daily monitoring and recording of CBG  adherence to ADA/ carb modified diet adherence to prescribed medication regimen contacting provider for new or worsened symptoms or questions Interventions:  . Collaboration with [name], DO regarding development and update of comprehensive plan of care as evidenced by provider attestation and co-signature . Inter-disciplinary care team collaboration (see longitudinal plan of care) . Provided education to patient about basic DM disease process. Discussed fasting  blood sugar goal <130 and post prandial of <180 . Reviewed medications with patient and discussed importance of medication adherence. 05-17-2020: The patient is compliant with medications. The patient denies any medication needs. . Discussed plans with patient for ongoing care management follow up and provided patient with direct contact information for care management team . Provided patient with  written educational materials related to hypo and hyperglycemia and importance of correct treatment . Advised patient, providing education and rationale, to check cbg bid and record, calling pcp for findings outside established parameters.  05-17-2020: The patient states that his blood sugars fasting are 130-140 consistently.  . Review of patient status, including review of consultants reports, relevant laboratory and other test results, and medications completed. Patient Goals/Self-Care Activities - Self administers oral medications as prescribed Self administers insulin as prescribed Attends all scheduled provider appointments Checks blood sugars as prescribed and utilize hyper and hypoglycemia protocol as needed Adheres to  prescribed ADA/carb modified - barriers to adherence to treatment plan identified - blood glucose monitoring encouraged - blood glucose readings reviewed - mutual A1C goal set or reviewed - resources required to improve adherence to care identified - self-awareness of signs/symptoms of hypo or hyperglycemia encouraged - use of blood glucose monitoring log promoted Follow Up Plan: Telephone follow up appointment with care management team member scheduled for: 05-17-2020 at 230 pm   Task: RNCM: Alleviate Barriers to Glycemic Management   Note:   Care Management Activities:    - barriers to adherence to treatment plan identified - blood glucose monitoring encouraged - blood glucose readings reviewed - mutual A1C goal set or reviewed - resources required to improve adherence to care identified - self-awareness of signs/symptoms of hypo or hyperglycemia encouraged - use of blood glucose monitoring log promoted       Patient Care Plan: RNCM: Chronic Pain (Adult)    Problem Identified: RNCM: Pain Management Plan (Chronic Pain)   Priority: High    Goal: RNCM: Pain Management Plan Developed Completed 05/17/2020  Priority: High  Note:   Current Barriers: Closing this goal. The patient denies pain or discomfort. The gabapentin is working well for the patient at this time.  . Knowledge Deficits related to managing acute/chronic pain . Non-adherence to scheduled provider appointments . Non-adherence to prescribed medication regimen . Difficulty obtaining medications . Chronic Disease Management support and education needs related to chronic pain . Lacks social connections . Does not contact provider office for questions/concerns Nurse Case Manager Clinical Goal(s):  . patient will verbalize understanding of plan for managing pain . patient will demonstrate use of different relaxation  skills and/or diversional activities to assist with pain reduction (distraction, imagery, relaxation,  massage, acupressure, TENS, heat, and cold application . patient will report pain at a level less than 3 to 4 on a 10-10 rating scale . patient will use pharmacological and nonpharmacological pain relief strategies . patient will verbalize acceptable level of pain relief and ability to engage in desired activities . patient will engage in desired activities without an increase in pain level Interventions:  . Collaboration with [name], DO regarding development and update of comprehensive plan of care as evidenced by provider attestation and co-signature . Inter-disciplinary care team collaboration (see longitudinal plan of care) . - deep breathing, relaxation and mindfulness use promoted . - effectiveness of pharmacologic therapy monitored . - medication-induced side effects managed . - misuse of pain medication assessed . - motivation and barriers to change assessed and addressed . - pain assessed . - pain treatment goals reviewed . - premedication prior to activity encouraged . Evaluation of current treatment plan related to pain to right shoulder and patient's adherence to plan as established by provider. . Advised patient to call the office for changes in pain or intensity of pain  . Provided education to patient re: monitoring for  changes in level or intensity of pain and discomfort  . Reviewed medications with patient and discussed compliance. The patient is happy that the gabapentin is working well for his pain level and he sees a big improvement with his pain level  . Discussed plans with patient for ongoing care management follow up and provided patient with direct contact information for care management team . Allow patient to maintain a diary of pain ratings, timing, precipitating events, medications, treatments, and what works best to relieve pain,  . Refer to support groups and self-help groups . Educate patient about the use of pharmacological interventions for  pain management- antianxiety, antidepressants, NSAIDS, opioid analgesics,  . Explain the importance of lifestyle modifications to effective pain management  Patient Goals/Self Care Activities:  .  Marland Kitchen Self-administers medications as prescribed . Attends all scheduled provider appointments . Calls pharmacy for medication refills . Calls provider office for new concerns or questions . - mutually acceptable comfort goal set . - pain assessed . - pain management plan developed . - pain treatment goals reviewed . - patient response to treatment assessed . - sharing of pain management plan with teachers and other caregivers encouraged Follow Up Plan: Telephone follow up appointment with care management team member scheduled for: 08-02-2020     Task: RNCM: Partner to Develop Chronic Pain Management Plan Completed 05/17/2020  Outcome: Positive  Note:   Care Management Activities:    - mutually acceptable comfort goal set - pain assessed - pain management plan developed - pain treatment goals reviewed - patient response to treatment assessed - sharing of pain management plan with teachers and other caregivers encouraged       Patient Care Plan: RNCM: Hypertension (Adult)    Problem Identified: RNCM: Hypertension (Hypertension)   Priority: Medium    Long-Range Goal: RNCM: Hypertension Monitored   Priority: Medium  Note:   Objective:  . Last practice recorded BP readings:  BP Readings from Last 3 Encounters:  02/22/20 (!) 152/61  01/20/20 121/88  01/16/20 139/62 .   Marland Kitchen Most recent eGFR/CrCl: No results found for: EGFR  No components found for: CRCL Current Barriers:  Marland Kitchen Knowledge Deficits related to basic understanding of hypertension pathophysiology and self care management . Knowledge Deficits related to understanding of medications prescribed for management of hypertension . Lacks social connections . Does not contact provider office for questions/concerns Case Manager Clinical  Goal(s):  Marland Kitchen Over the next 120 days, patient will verbalize understanding of plan for hypertension management . Over the next 120 days, patient will demonstrate improved adherence to prescribed treatment plan for hypertension as evidenced by taking all medications as prescribed, monitoring and recording blood pressure as directed, adhering to low sodium/DASH diet . Over the next 120 days, patient will demonstrate improved health management independence as evidenced by checking blood pressure as directed and notifying PCP if SBP>160 or DBP > 90, taking all medications as prescribe, and adhering to a low sodium diet as discussed. . Over the next 120 days, patient will verbalize basic understanding of hypertension disease process and self health management plan as evidenced by hearth healthy diet compliance, medications compliance, working with CCM team to manage health and well being  Interventions:  . Collaboration with [name], DO regarding development and update of comprehensive plan of care as evidenced by provider attestation and co-signature . Inter-disciplinary care team collaboration (see longitudinal plan of care) . Evaluation of current treatment plan related to hypertension self management and patient's adherence to  plan as established by provider. . Provided education to patient re: stroke prevention, s/s of heart attack and stroke, DASH diet, complications of uncontrolled blood pressure . Reviewed medications with patient and discussed importance of compliance. 05-17-2020: The patient states that he is compliant with medications . Discussed plans with patient for ongoing care management follow up and provided patient with direct contact information for care management team.  . Advised patient, providing education and rationale, to monitor blood pressure daily and record, calling PCP for findings outside established parameters.  Patient Goals/Self-Care Activities . Over the  next 120 days, patient will:  - Self administers medications as prescribed Attends all scheduled provider appointments Calls provider office for new concerns, questions, or BP outside discussed parameters Checks BP and records as discussed Follows a low sodium diet/DASH diet - blood pressure trends reviewed - depression screen reviewed - home or ambulatory blood pressure monitoring encouraged Follow Up Plan: Telephone follow up appointment with care management team member scheduled for: 08-02-2020 at 230 pm   Task: RNCM: Identify and Monitor Blood Pressure Elevation   Note:   Care Management Activities:    - blood pressure trends reviewed - depression screen reviewed - home or ambulatory blood pressure monitoring encouraged     Patient Care Plan: RNCM: HLD Management    Problem Identified: RNCM: HLD Management   Priority: Medium    Long-Range Goal: RNCM: HLD Management   Priority: Medium  Note:   Current Barriers:  . Poorly controlled hyperlipidemia, complicated by HTN, DM, shoulder pain  . Current antihyperlipidemic regimen: Lipitor 40 mg QD . Most recent lipid panel:     Component Value Date/Time   CHOL 147 09/12/2019 0757   TRIG 125 09/12/2019 0757   HDL 51 09/12/2019 0757   CHOLHDL 2.9 09/12/2019 0757   LDLCALC 75 09/12/2019 0757 .   Marland Kitchen ASCVD risk enhancing conditions: age >51, DM, HTN, CKD,  former smoker . Lacks social connections . Does not contact provider office for questions/concerns  RN Care Manager Clinical Goal(s):  Marland Kitchen Over the next 120 days, patient will work with Consulting civil engineer, providers, and care team towards execution of optimized self-health management plan . patient will verbalize understanding of plan for HLD . patient will work with [facility] and pcp to address needs related to effective management of HLD   Interventions: . Collaboration with [name], DO regarding development and update of comprehensive plan of care as evidenced by  provider attestation and co-signature . Inter-disciplinary care team collaboration (see longitudinal plan of care) . Medication review performed; medication list updated in electronic medical record.  Bertram Savin care team collaboration (see longitudinal plan of care) . Referred to pharmacy team for assistance with HLD medication management . Evaluation of current treatment plan related to HLD  and patient's adherence to plan as established by provider. 05-17-2020: The patient denies any issues at this time. Is compliant with plan of care. . Advised patient to call the office for changes or questions . Provided education to patient re: heart healthy/ADA diet, taking medications as prescribed and working with CCM team to optimize health and well being. Education on activity level and safety.  . Discussed plans with patient for ongoing care management follow up and provided patient with direct contact information for care management team   Patient Goals/Self-Care Activities: . Over the next 120 days, patient will:   - call for medicine refill 2 or 3 days before it runs out - call if I am  sick and can't take my medicine - keep a list of all the medicines I take; vitamins and herbals too - learn to read medicine labels - use a pillbox to sort medicine - use an alarm clock or phone to remind me to take my medicine - change to whole grain breads, cereal, pasta - drink 6 to 8 glasses of water each day - eat 3 to 5 servings of fruits and vegetables each day - eat 5 or 6 small meals each day - fill half the plate with nonstarchy vegetables - limit fast food meals to no more than 1 per week - manage portion size - prepare main meal at home 3 to 5 days each week - read food labels for fat, fiber, carbohydrates and portion size - be open to making changes - I can manage, know and watch for signs of a heart attack - if I have chest pain, call for help - learn about small changes that will  make a big difference - learn my personal risk factors  - barriers to meeting goals identified - change-talk evoked - choices provided - decision-making supported - health risks reviewed - problem-solving facilitated - questions answered - readiness for change evaluated - reassurance provided - self-reflection promoted - self-reliance encouraged  Follow Up Plan: Telephone follow up appointment with care management team member scheduled for:08-02-2020 at 230 pm      Task: RNCM: Mutually Develop and Royce Macadamia Achievement of Patient Goals   Note:   Care Management Activities:    - barriers to meeting goals identified - change-talk evoked - choices provided - decision-making supported - health risks reviewed - problem-solving facilitated - questions answered - readiness for change evaluated - reassurance provided - self-reflection promoted - self-reliance encouraged       Patient Care Plan: General Social Work (Adult)    Problem Identified: Coping Skills (General Plan of Care)     Long-Range Goal: Coping Skills Enhanced   Start Date: 04/03/2020  Priority: Medium  Note:   Timeframe:  Long-Range Goal Priority:  Medium Start Date:  04/03/20                       Expected End Date:  07/04/20                   Follow Up Date- 90 days from 05/23/20   - avoid negative self-talk - develop a personal safety plan - develop a plan to deal with triggers like holidays, anniversaries - exercise at least 2 to 3 times per week - have a plan for how to handle bad days - journal feelings and what helps to feel better or worse - spend time or talk with others at least 2 to 3 times per week - spend time or talk with others every day - watch for early signs of feeling worse - write in journal every day  - check out options for in-home help, long-term care or hospice - complete a living will - discuss my treatment options with the doctor or nurse - do one enjoyable thing every day - do  something different, like talking to a new person or going to a new place, every day - learn something new by asking, reading and searching the Internet every day - make an audio or video recording for my loved ones - make shared treatment decisions with doctor - meditate daily - name a health care proxy (Media planner) - share memories  using a picture album or scrapbook with my loved ones - spend time with a child every day, borrow one if I have to - spend time outdoors at least 3 times a week - strengthen or fix relationships with loved ones    Why is this important?    Keeping track of your progress will help your treatment team find the right mix of medicine and therapy for you.   Write in your journal every day.   Day-to-day changes in depression symptoms are normal. It may be more helpful to check your progress at the end of each week instead of every day.     Depression screen [facility] 2/9 01/16/2020 11/01/2019 10/12/2019  Decreased Interest 1 1 3   Down, Depressed, Hopeless 0 0 2  PHQ - 2 Score 1 1 5   Altered sleeping 0 1 3  Tired, decreased energy 1 1 2   Change in appetite 0 0 3  Feeling bad or failure about yourself  0 0 0  Trouble concentrating 0 0 0  Moving slowly or fidgety/restless 0 0 0  Suicidal thoughts 0 0 0  PHQ-9 Score 2 3 13   Difficult doing work/chores Not difficult at all Not difficult at all Somewhat difficult  Some recent data might be hidden    GAD 7 : Generalized Anxiety Score 01/16/2020 11/01/2019 10/12/2019 04/01/2018  Nervous, Anxious, on Edge 1 1 3 1   Control/stop worrying 1 1 2 2   Worry too much - different things 1 1 2 1   Trouble relaxing 0 0 1 1  Restless 0 0 1 1  Easily annoyed or irritable 1 0 3 1  Afraid - awful might happen 1 0 2 0  Total GAD 7 Score 5 3 14 7   Anxiety Difficulty Not difficult at all Not difficult at all Somewhat difficult Somewhat difficult   Assessment, progress and current barriers:  Patient is currently experiencing symptoms  of  anxiety and depression which seems to be exacerbated by his pain and insomnia.   Clinical Goal(s): Over the next 120 days, patient will work with SW to reduce or manage symptoms of anxiety, depression, insomnia, and mood instability until connected for ongoing counseling.  Over the next 120 days, patient will work with SW to address concerns related to gaining healthy coping skills and resource connection in order to improve mental health Clinical Interventions:  . Assessed thoughts of SI, plan and access to means. Assessed patient's previous treatment, needs, coping skills, current treatment, support system and barriers to care . Patient interviewed and appropriate assessments performed: brief mental health assessment . Provided basic mental health support, education and interventions . Discussed several options for long term counseling based on need and insurance. Assisted patient with narrowing the options down to [facility] or [facility] but patient declined needing mental health resource implementation at this time. Patient lost his son over 2 years ago and is still experiencing signs of grief. However, he also denies needing a grief therapy referral at this time as he reports he is able to manage his grief on his own, in his own way.  . Reviewed mental health medications with patient prescribed by PCP and discussed compliance. Patient reports that his Buspar medication is effectively working for him. He reports that he gets all of his medications delivered directly to his residence.  . Patient was prescribed gabapentin during his office visit with PCP yesterday to help with his chronic pain and insomnia.  . Assisted patient/caregiver with obtaining information about health  plan benefits . Provided education and assistance to client regarding Advanced Directives. . Provided education to patient/caregiver about Hospice and/or Palliative Care services . Patient denies wanting to pursue the [place] and Attendance program in order to increase his level of support in the home. He reports that his daughter resides with him for assistance but that he is able to do most things on his own for himself.  . Patient reports that he spends a lot of time outside in his garden.   Follow Up Plan:  SW will follow up with patient by phone over the next quarter  Notes: Patient reports that the [place] mailed him a bike that he uses every morning to get exercise. He reports that he completes several push up's in the morning as well. Patient went to the grocery store this morning by himself to get food. Patient remains active.    Task: Support Psychosocial Response to Risk or Actual Health Condition   Note:   Care Management Activities:    - active listening utilized - counseling provided - current coping strategies identified - decision-making supported - healthy lifestyle promoted - journaling promoted - meditative movement therapy encouraged - mindfulness encouraged - participation in counseling encouraged - problem-solving facilitated - relaxation techniques promoted - self-reflection promoted - spiritual activities promoted - verbalization of feelings encouraged    Notes:      Patient verbalizes understanding of instructions provided today and agrees to view in [place].   Telephone follow up appointment with care management team member scheduled for: 08-02-2020 at  42 pm  Noreene Larsson RN, MSN, [place] Mobile: [phone]

## 2020-05-23 ENCOUNTER — Telehealth: Payer: Medicare Other

## 2020-05-31 ENCOUNTER — Other Ambulatory Visit: Payer: Self-pay | Admitting: Family Medicine

## 2020-05-31 DIAGNOSIS — K219 Gastro-esophageal reflux disease without esophagitis: Secondary | ICD-10-CM

## 2020-05-31 DIAGNOSIS — R110 Nausea: Secondary | ICD-10-CM

## 2020-06-26 ENCOUNTER — Ambulatory Visit (INDEPENDENT_AMBULATORY_CARE_PROVIDER_SITE_OTHER): Payer: Medicare Other | Admitting: Licensed Clinical Social Worker

## 2020-06-26 DIAGNOSIS — M1990 Unspecified osteoarthritis, unspecified site: Secondary | ICD-10-CM

## 2020-06-26 DIAGNOSIS — F331 Major depressive disorder, recurrent, moderate: Secondary | ICD-10-CM

## 2020-06-26 DIAGNOSIS — I10 Essential (primary) hypertension: Secondary | ICD-10-CM

## 2020-06-26 DIAGNOSIS — F411 Generalized anxiety disorder: Secondary | ICD-10-CM

## 2020-06-26 DIAGNOSIS — E1140 Type 2 diabetes mellitus with diabetic neuropathy, unspecified: Secondary | ICD-10-CM | POA: Diagnosis not present

## 2020-06-26 NOTE — Patient Instructions (Signed)
Visit Information  Goals Addressed              This Visit's Progress     Patient Stated   .  SW-Manage My Emotions (pt-stated)   On track     Follow Up Date- 08/15/20   Patient Goals: . Attend all scheduled appointments with providers . Contact office with any questions or concerns . Continue to utilize healthy coping skills to assist in management of symptoms    .  SW-Matintain My Quality of Life (pt-stated)   On track     Follow Up Date-08/15/20  Patient Goals: . Attend all scheduled appointments with providers . Contact office with any questions or concerns . Continue to utilize healthy coping skills to assist in management of symptoms      Other   .  SW-Track and Manage My Symptoms-Depression   On track     Timeframe:  Long-Range Goal Priority:  Medium Start Date:  04/03/20                       Expected End Date:  10/03/20                   Follow Up Date- 08/15/20   Patient Goals: . Attend all scheduled appointments with providers . Contact office with any questions or concerns . Continue to utilize healthy coping skills to assist in management of symptoms       Patient verbalizes understanding of instructions provided today and agrees to view in [place].   Telephone follow up appointment with care management team member scheduled for:08/15/20  Christa See, MSW, [email or web address] Phone [phone] 1:25 PM

## 2020-06-26 NOTE — Chronic Care Management (AMB) (Signed)
Chronic Care Management    Clinical Social Work Note  06/26/2020 Name: Charles York MRN: [id number] DOB: March 28, 1930  Charles York is a 87 y.o. year old male who is a primary care patient of [name], DO. The CCM team was consulted to assist the patient with chronic disease management and/or care coordination needs related to: Mental Health Counseling and Resources.   Engaged with patient by telephone for follow up visit in response to provider referral for social work chronic care management and care coordination services.   Consent to Services:  The patient was given information about Chronic Care Management services, agreed to services, and gave verbal consent prior to initiation of services.  Please see initial visit note for detailed documentation.   Patient agreed to services and consent obtained.   Assessment: Patient is engaged in conversation, continues to maintain positive progress with care plan goals. Patient continues to receive strong support from family and was successful in identifying healthy coping skills to manage symptoms. See Care Plan below for interventions and patient self-care actives. Recent life changes /stressors: Pain management Recommendation: Patient may benefit from, and is in agreement to continue utilizing strategies discussed to manage symptoms and promote health.  Follow up Plan: Patient would like continued follow-up.  CCM LCSW will follow up with patient on 08/15/20. Patient will call office if needed prior to next encounter.  SDOH (Social Determinants of Health) assessments and interventions performed:    Advanced Directives Status: Not addressed in this encounter.  CCM Care Plan  Allergies  Allergen Reactions  . Trulicity [Dulaglutide] Swelling    Swollen lips, shortness of breath,   . Buspirone     Dizziness  . Penicillins Itching and Rash    Has patient had a PCN reaction causing immediate rash, facial/tongue/throat swelling,  SOB or lightheadedness with hypotension: No Has patient had a PCN reaction causing severe rash involving mucus membranes or skin necrosis: No Has patient had a PCN reaction that required hospitalization: No Has patient had a PCN reaction occurring within the last 10 years: No If all of the above answers are "NO", then may proceed with Cephalosporin use.     Outpatient Encounter Medications as of 06/26/2020  Medication Sig Note  . aspirin 325 MG EC tablet TK 1 T PO QD FOR UP TO 90 DAYS THEN CAN REDUCE TO 81 MG DAILY   . atorvastatin (LIPITOR) 40 MG tablet Take 1 tablet (40 mg total) by mouth at bedtime.   . ferrous sulfate 325 (65 FE) MG tablet Take 325 mg by mouth every other day.   . gabapentin (NEURONTIN) 100 MG capsule Take 1 cap in morning, 1 cap in afternoon, and 3 caps at night.   Marland Kitchen HUMALOG 100 UNIT/ML injection ADMINISTER 12 UNITS UNDER THE SKIN TWICE DAILY WITH A MEAL   . HUMALOG KWIKPEN 100 UNIT/ML KwikPen Inject 0.12 mLs (12 Units total) into the skin 2 (two) times daily with a meal. Up to 50 units/day as directed by MD   . hydroxypropyl methylcellulose / hypromellose (ISOPTO TEARS / GONIOVISC) 2.5 % ophthalmic solution Place 1 drop into both eyes 4 (four) times daily.   . insulin aspart protamine - aspart (NOVOLOG 70/30 MIX) (70-30) 100 UNIT/ML FlexPen Inject 12 Units into the skin 2 (two) times daily. In the morning and evening   . ipratropium (ATROVENT) 0.06 % nasal spray USE 2 SPRAYS IN EACH NOSTRIL FOUR TIMES DAILY FOR UP TO 5 TO 7 DAYS THEN STOP 12/29/2018:  Dropped to twice daily due to excessive dryness.  Marland Kitchen LANTUS 100 UNIT/ML injection INJECT 27 UNITS SUBCUTANEOUSLY BEFORE BREAKFAST   . losartan (COZAAR) 25 MG tablet TAKE 1 TABLET(25 MG) BY MOUTH DAILY   . omeprazole (PRILOSEC) 20 MG capsule TAKE 1 CAPSULE BY MOUTH TWICE A DAY BEFORE A MEAL   . polyethylene glycol powder (GLYCOLAX/MIRALAX) 17 GM/SCOOP powder Take 17-34 g by mouth 2 (two) times daily as needed for mild  constipation or moderate constipation. (Patient not taking: No sig reported)   . sucralfate (CARAFATE) 1 g tablet Take 1 tablet (1 g total) by mouth 4 (four) times daily.   . vitamin B-12 (CYANOCOBALAMIN) 1000 MCG tablet Take 2,000 mcg by mouth daily.  (Patient not taking: No sig reported)    No facility-administered encounter medications on file as of 06/26/2020.    Patient Active Problem List   Diagnosis Date Noted  . Major depressive disorder, recurrent, moderate ([place]) 10/12/2019  . GAD (generalized anxiety disorder) 10/12/2019  . Chronic right shoulder pain 08/17/2018  . Absolute anemia 07/30/2018  . Primary osteoarthritis involving multiple joints 06/15/2018  . GERD (gastroesophageal reflux disease) 11/18/2016  . Diabetic retinopathy ([place]) 11/18/2016  . Hyperlipidemia associated with type 2 diabetes mellitus ([place]) 11/18/2016  . Daytime sleepiness 07/15/2016  . Fatigue 07/15/2016  . Presbycusis of both ears 07/15/2016  . Tinnitus of right ear 07/15/2016  . Allergic rhinitis due to allergen 06/09/2016  . CKD (chronic kidney disease), stage III ([place]) 02/27/2016  . BPPV (benign paroxysmal positional vertigo), left 02/27/2016  . Chronic pansinusitis 02/27/2016  . Arthritis 07/03/2015  . Colon polyp 07/03/2015  . Essential hypertension 07/03/2015  . High cholesterol 07/03/2015  . Type 2 diabetes, controlled, with neuropathy ([place]) 07/03/2015    Conditions to be addressed/monitored: Anxiety and Depression; Mental Health Concerns   Care Plan : General Social Work (Adult)  Updates made by Rebekah Chesterfield, LCSW since 06/26/2020 12:00 AM    Problem: Coping Skills (General Plan of Care)     Long-Range Goal: Coping Skills Enhanced   Start Date: 04/03/2020  This Visit's Progress: On track  Priority: Medium  Note:   Timeframe:  Long-Range Goal Priority:  Medium Start Date:  04/03/20                       Expected End Date:  10/03/20                   Follow Up Date- 08/15/20    Assessment, progress and current barriers:  Patient is currently experiencing symptoms of  anxiety and depression which seems to be exacerbated by his pain and insomnia.   Clinical Goal(s): Over the next 120 days, patient will work with SW to reduce or manage symptoms of anxiety, depression, insomnia, and mood instability until connected for ongoing counseling.  Over the next 120 days, patient will work with SW to address concerns related to gaining healthy coping skills and resource connection in order to improve mental health Clinical Interventions:  . Assessed thoughts of SI, plan and access to means. Assessed patient's previous treatment, needs, coping skills, current treatment, support system and barriers to care . Patient interviewed and appropriate assessments performed: brief mental health assessment . Provided basic mental health support, education and interventions . Patient reports that anxiety and depression symptoms have decreased significantly. As a result, he is not taking medications to assist with management of symptoms anymore . Patient continues to receive strong support from  daughter who resides with patient  . Patient reports that he is managing pain well. States that his arthritis flares when there is inclement weather  . Patient continues to exercise to cope with stressors and promote health. He utilizes a step machine in morning and weights for shoulders and arms . Patient denies any sleep concerns . Patient declined the need for any mental health resource   Patient Goals: . Attend all scheduled appointments with providers . Contact office with any questions or concerns . Continue to utilize healthy coping skills to assist in management of symptoms  Follow Up Plan:  SW will follow up with patient by phone on 08/15/20       Christa See, MSW, [email or web address] Phone [phone] 1:23 PM

## 2020-06-27 DIAGNOSIS — R200 Anesthesia of skin: Secondary | ICD-10-CM | POA: Diagnosis not present

## 2020-06-27 DIAGNOSIS — M25511 Pain in right shoulder: Secondary | ICD-10-CM | POA: Diagnosis not present

## 2020-06-27 DIAGNOSIS — M542 Cervicalgia: Secondary | ICD-10-CM | POA: Diagnosis not present

## 2020-06-27 DIAGNOSIS — R202 Paresthesia of skin: Secondary | ICD-10-CM | POA: Diagnosis not present

## 2020-08-02 ENCOUNTER — Telehealth: Payer: Medicare Other | Admitting: General Practice

## 2020-08-02 ENCOUNTER — Ambulatory Visit (INDEPENDENT_AMBULATORY_CARE_PROVIDER_SITE_OTHER): Payer: Medicare Other | Admitting: General Practice

## 2020-08-02 DIAGNOSIS — I10 Essential (primary) hypertension: Secondary | ICD-10-CM

## 2020-08-02 DIAGNOSIS — E1169 Type 2 diabetes mellitus with other specified complication: Secondary | ICD-10-CM | POA: Diagnosis not present

## 2020-08-02 DIAGNOSIS — E785 Hyperlipidemia, unspecified: Secondary | ICD-10-CM

## 2020-08-02 DIAGNOSIS — E1140 Type 2 diabetes mellitus with diabetic neuropathy, unspecified: Secondary | ICD-10-CM

## 2020-08-02 NOTE — Patient Instructions (Signed)
Visit Information  PATIENT GOALS:  Goals Addressed             This Visit's Progress    RNCM: Track and Manage My Blood Pressure-Hypertension       Timeframe:  Short-Term Goal Priority:  High Start Date:  08-02-2020                           Expected End Date:      03-04-2021                 Follow Up Date 09/27/2020    - check blood pressure weekly - choose a place to take my blood pressure (home, clinic or office, retail store) - write blood pressure results in a log or diary    Why is this important?   You won't feel high blood pressure, but it can still hurt your blood vessels.  High blood pressure can cause heart or kidney problems. It can also cause a stroke.  Making lifestyle changes like losing a little weight or eating less salt will help.  Checking your blood pressure at home and at different times of the day can help to control blood pressure.  If the doctor prescribes medicine remember to take it the way the doctor ordered.  Call the office if you cannot afford the medicine or if there are questions about it.     Notes: The patient states he checks his blood pressure at least twice a week to see how it is doing. He says it usually runs 981 to 191 systolic and 58 to 68 diastolic. Denies any issues with orthostatic hypotension.          Patient verbalizes understanding of instructions provided today and agrees to view in [place].   Telephone follow up appointment with care management team member scheduled for: 09-27-2020 at 145 pm  Noreene Larsson RN, MSN, [place] Mobile: [phone]

## 2020-08-02 NOTE — Chronic Care Management (AMB) (Signed)
Chronic Care Management   CCM RN Visit Note  08/02/2020 Name: Charles York MRN: [id number] DOB: 08-07-1930  Subjective: Charles York is a 87 y.o. year old male who is a primary care patient of [name], DO. The care management team was consulted for assistance with disease management and care coordination needs.    Engaged with patient by telephone for follow up visit in response to provider referral for case management and/or care coordination services.   Consent to Services:  The patient was given information about Chronic Care Management services, agreed to services, and gave verbal consent prior to initiation of services.  Please see initial visit note for detailed documentation.   Patient agreed to services and verbal consent obtained.   Assessment: Review of patient past medical history, allergies, medications, health status, including review of consultants reports, laboratory and other test data, was performed as part of comprehensive evaluation and provision of chronic care management services.   SDOH (Social Determinants of Health) assessments and interventions performed:    CCM Care Plan  Allergies  Allergen Reactions   Trulicity [Dulaglutide] Swelling    Swollen lips, shortness of breath,    Buspirone     Dizziness   Penicillins Itching and Rash    Has patient had a PCN reaction causing immediate rash, facial/tongue/throat swelling, SOB or lightheadedness with hypotension: No Has patient had a PCN reaction causing severe rash involving mucus membranes or skin necrosis: No Has patient had a PCN reaction that required hospitalization: No Has patient had a PCN reaction occurring within the last 10 years: No If all of the above answers are "NO", then may proceed with Cephalosporin use.     Outpatient Encounter Medications as of 08/02/2020  Medication Sig Note   aspirin 325 MG EC tablet TK 1 T PO QD FOR UP TO 90 DAYS THEN CAN REDUCE TO 81 MG DAILY     atorvastatin (LIPITOR) 40 MG tablet Take 1 tablet (40 mg total) by mouth at bedtime.    ferrous sulfate 325 (65 FE) MG tablet Take 325 mg by mouth every other day.    gabapentin (NEURONTIN) 100 MG capsule Take 1 cap in morning, 1 cap in afternoon, and 3 caps at night.    HUMALOG 100 UNIT/ML injection ADMINISTER 12 UNITS UNDER THE SKIN TWICE DAILY WITH A MEAL    HUMALOG KWIKPEN 100 UNIT/ML KwikPen Inject 0.12 mLs (12 Units total) into the skin 2 (two) times daily with a meal. Up to 50 units/day as directed by MD    hydroxypropyl methylcellulose / hypromellose (ISOPTO TEARS / GONIOVISC) 2.5 % ophthalmic solution Place 1 drop into both eyes 4 (four) times daily.    insulin aspart protamine - aspart (NOVOLOG 70/30 MIX) (70-30) 100 UNIT/ML FlexPen Inject 12 Units into the skin 2 (two) times daily. In the morning and evening    ipratropium (ATROVENT) 0.06 % nasal spray USE 2 SPRAYS IN EACH NOSTRIL FOUR TIMES DAILY FOR UP TO 5 TO 7 DAYS THEN STOP 12/29/2018: Dropped to twice daily due to excessive dryness.   LANTUS 100 UNIT/ML injection INJECT 27 UNITS SUBCUTANEOUSLY BEFORE BREAKFAST    losartan (COZAAR) 25 MG tablet TAKE 1 TABLET(25 MG) BY MOUTH DAILY    omeprazole (PRILOSEC) 20 MG capsule TAKE 1 CAPSULE BY MOUTH TWICE A DAY BEFORE A MEAL    polyethylene glycol powder (GLYCOLAX/MIRALAX) 17 GM/SCOOP powder Take 17-34 g by mouth 2 (two) times daily as needed for mild constipation or moderate constipation. (Patient  not taking: No sig reported)    sucralfate (CARAFATE) 1 g tablet Take 1 tablet (1 g total) by mouth 4 (four) times daily.    vitamin B-12 (CYANOCOBALAMIN) 1000 MCG tablet Take 2,000 mcg by mouth daily.  (Patient not taking: No sig reported)    No facility-administered encounter medications on file as of 08/02/2020.    Patient Active Problem List   Diagnosis Date Noted   Major depressive disorder, recurrent, moderate ([place]) 10/12/2019   GAD (generalized anxiety disorder) 10/12/2019   Chronic  right shoulder pain 08/17/2018   Absolute anemia 07/30/2018   Primary osteoarthritis involving multiple joints 06/15/2018   GERD (gastroesophageal reflux disease) 11/18/2016   Diabetic retinopathy ([place]) 11/18/2016   Hyperlipidemia associated with type 2 diabetes mellitus ([place]) 11/18/2016   Daytime sleepiness 07/15/2016   Fatigue 07/15/2016   Presbycusis of both ears 07/15/2016   Tinnitus of right ear 07/15/2016   Allergic rhinitis due to allergen 06/09/2016   CKD (chronic kidney disease), stage III (HCC) 02/27/2016   BPPV (benign paroxysmal positional vertigo), left 02/27/2016   Chronic pansinusitis 02/27/2016   Arthritis 07/03/2015   Colon polyp 07/03/2015   Essential hypertension 07/03/2015   High cholesterol 07/03/2015   Type 2 diabetes, controlled, with neuropathy ([place]) 07/03/2015    Conditions to be addressed/monitored:HTN, HLD, and DMII  Care Plan : RNCM: Diabetes Type 2 (Adult)  Updates made by Vanita Ingles since 08/02/2020 12:00 AM     Problem: RNCM: Glycemic Management (Diabetes, Type 2)   Priority: Medium     Long-Range Goal: RNCM: Glycemic Management Optimized   Start Date: 03/22/2020  Expected End Date: 03/30/2021  This Visit's Progress: On track  Priority: Medium  Note:   Objective:  Lab Results  Component Value Date   HGBA1C 7.6 (H) 09/12/2019     Lab Results  Component Value Date   CREATININE 1.32 (H) 01/20/2020   CREATININE 1.30 (H) 10/05/2019   CREATININE 1.40 (H) 09/12/2019   No results found for: EGFR Current Barriers:  Knowledge Deficits related to basic Diabetes pathophysiology and self care/management Knowledge Deficits related to medications used for management of diabetes Lacks social connections Does not contact provider office for questions/concerns Case Manager Clinical Goal(s):  patient will demonstrate improved adherence to prescribed treatment plan for diabetes self care/management as evidenced by: daily monitoring and recording of  CBG  adherence to ADA/ carb modified diet adherence to prescribed medication regimen contacting provider for new or worsened symptoms or questions Interventions:  Collaboration with Parks Ranger, Devonne Doughty, DO regarding development and update of comprehensive plan of care as evidenced by provider attestation and co-signature Inter-disciplinary care team collaboration (see longitudinal plan of care) Provided education to patient about basic DM disease process. Discussed fasting  blood sugar goal <130 and post prandial of <180 Reviewed medications with patient and discussed importance of medication adherence. 08-02-2020: The patient is compliant with medications. The patient denies any medication needs. Discussed plans with patient for ongoing care management follow up and provided patient with direct contact information for care management team Provided patient with written educational materials related to hypo and hyperglycemia and importance of correct treatment. 08-02-2020: Denies any lows at this time. States he is doing well and eating good.  Advised patient, providing education and rationale, to check cbg bid and record, calling pcp for findings outside established parameters.  08-02-2020: The patient states that his blood sugars fasting are 130-140 consistently.  Review of patient status, including review of consultants reports, relevant laboratory and  other test results, and medications completed. Patient Goals/Self-Care Activities - Self administers oral medications as prescribed Self administers insulin as prescribed Attends all scheduled provider appointments Checks blood sugars as prescribed and utilize hyper and hypoglycemia protocol as needed Adheres to prescribed ADA/carb modified - barriers to adherence to treatment plan identified - blood glucose monitoring encouraged - blood glucose readings reviewed - mutual A1C goal set or reviewed - resources required to improve adherence to  care identified - self-awareness of signs/symptoms of hypo or hyperglycemia encouraged - use of blood glucose monitoring log promoted Follow Up Plan: Telephone follow up appointment with care management team member scheduled for: 09-27-2020 at 145pm    Task: RNCM: Alleviate Barriers to Glycemic Management Completed 08/02/2020  Outcome: Positive  Note:   Care Management Activities:    - barriers to adherence to treatment plan identified - blood glucose monitoring encouraged - blood glucose readings reviewed - mutual A1C goal set or reviewed - resources required to improve adherence to care identified - self-awareness of signs/symptoms of hypo or hyperglycemia encouraged - use of blood glucose monitoring log promoted        Care Plan : RNCM: Chronic Pain (Adult)  Updates made by Vanita Ingles since 08/02/2020 12:00 AM  Completed 08/02/2020   Problem: RNCM: Pain Management Plan (Chronic Pain) Resolved 08/02/2020  Priority: High     Care Plan : RNCM: Hypertension (Adult)  Updates made by Vanita Ingles since 08/02/2020 12:00 AM     Problem: RNCM: Hypertension (Hypertension)   Priority: Medium     Long-Range Goal: RNCM: Hypertension Monitored   Start Date: 03/22/2020  Expected End Date: 03/30/2021  This Visit's Progress: On track  Priority: Medium  Note:   Objective:  Last practice recorded BP readings:  BP Readings from Last 3 Encounters:  02/22/20 (!) 152/61  01/20/20 121/88  01/16/20 139/62   Most recent eGFR/CrCl: No results found for: EGFR  No components found for: CRCL Current Barriers:  Knowledge Deficits related to basic understanding of hypertension pathophysiology and self care management Knowledge Deficits related to understanding of medications prescribed for management of hypertension Unable to independently manage HTN Does not contact provider office for questions/concerns Case Manager Clinical Goal(s):  patient will verbalize understanding of plan for  hypertension management patient will demonstrate improved adherence to prescribed treatment plan for hypertension as evidenced by taking all medications as prescribed, monitoring and recording blood pressure as directed, adhering to low sodium/DASH diet patient will demonstrate improved health management independence as evidenced by checking blood pressure as directed and notifying PCP if SBP>150 or DBP > 90, taking all medications as prescribe, and adhering to a low sodium diet as discussed. patient will verbalize basic understanding of hypertension disease process and self health management plan as evidenced by compliance with heart healthy/ADA diet, compliance with medications and working with the CCM team to optimize health and well being.  Interventions:  Collaboration with [name], DO regarding development and update of comprehensive plan of care as evidenced by provider attestation and co-signature Inter-disciplinary care team collaboration (see longitudinal plan of care) Evaluation of current treatment plan related to hypertension self management and patient's adherence to plan as established by provider. Provided education to patient re: stroke prevention, s/s of heart attack and stroke, DASH diet, complications of uncontrolled blood pressure Reviewed medications with patient and discussed importance of compliance Discussed plans with patient for ongoing care management follow up and provided patient with direct contact information for care management team  Advised patient, providing education and rationale, to monitor blood pressure daily and record, calling PCP for findings outside established parameters.  Self-Care Activities: - Self administers medications as prescribed Attends all scheduled provider appointments Calls provider office for new concerns, questions, or BP outside discussed parameters Checks BP and records as discussed Follows a low sodium diet/DASH  diet Patient Goals: - check blood pressure weekly - choose a place to take my blood pressure (home, clinic or office, retail store) - write blood pressure results in a log or diary - agree on reward when goals are met - agree to work together to make changes - ask questions to understand - have a family meeting to talk about healthy habits - learn about high blood pressure  Follow Up Plan: Telephone follow up appointment with care management team member scheduled for: 09-27-2020 at 145 pm    Task: RNCM: Identify and Monitor Blood Pressure Elevation Completed 08/02/2020  Outcome: Positive  Note:   Care Management Activities:    - blood pressure trends reviewed - depression screen reviewed - home or ambulatory blood pressure monitoring encouraged      Care Plan : RNCM: HLD Management  Updates made by Vanita Ingles since 08/02/2020 12:00 AM     Problem: RNCM: HLD Management   Priority: Medium     Long-Range Goal: RNCM: HLD Management   Start Date: 03/22/2020  Expected End Date: 03/30/2021  This Visit's Progress: On track  Priority: Medium  Note:   Current Barriers:  Poorly controlled hyperlipidemia, complicated by HTN, DM, shoulder pain  Current antihyperlipidemic regimen: Lipitor 40 mg QD Most recent lipid panel:     Component Value Date/Time   CHOL 147 09/12/2019 0757   TRIG 125 09/12/2019 0757   HDL 51 09/12/2019 0757   CHOLHDL 2.9 09/12/2019 0757   LDLCALC 75 09/12/2019 0757   ASCVD risk enhancing conditions: age >70, DM, HTN, CKD,  former smoker Lacks Scientist, product/process development Does not contact provider office for Health visitor Clinical Goal(s):  Over the next 120 days, patient will work with Consulting civil engineer, providers, and care team towards execution of optimized self-health management plan patient will verbalize understanding of plan for HLD patient will work with [place] and pcp to address needs related to effective management of HLD    Interventions: Collaboration with Parks Ranger, Devonne Doughty, DO regarding development and update of comprehensive plan of care as evidenced by provider attestation and co-signature Inter-disciplinary care team collaboration (see longitudinal plan of care) Medication review performed; medication list updated in electronic medical record.  Inter-disciplinary care team collaboration (see longitudinal plan of care) Referred to pharmacy team for assistance with HLD medication management Evaluation of current treatment plan related to HLD  and patient's adherence to plan as established by provider. 08-02-2020: The patient denies any issues at this time. Is compliant with plan of care. Advised patient to call the office for changes or questions Provided education to patient re: heart healthy/ADA diet, taking medications as prescribed and working with CCM team to optimize health and well being. Education on activity level and safety.  Discussed plans with patient for ongoing care management follow up and provided patient with direct contact information for care management team   Patient Goals/Self-Care Activities: Over the next 120 days, patient will:   - call for medicine refill 2 or 3 days before it runs out - call if I am sick and can't take my medicine - keep a list of all the medicines  I take; vitamins and herbals too - learn to read medicine labels - use a pillbox to sort medicine - use an alarm clock or phone to remind me to take my medicine - change to whole grain breads, cereal, pasta - drink 6 to 8 glasses of water each day - eat 3 to 5 servings of fruits and vegetables each day - eat 5 or 6 small meals each day - fill half the plate with nonstarchy vegetables - limit fast food meals to no more than 1 per week - manage portion size - prepare main meal at home 3 to 5 days each week - read food labels for fat, fiber, carbohydrates and portion size - be open to making changes - I can  manage, know and watch for signs of a heart attack - if I have chest pain, call for help - learn about small changes that will make a big difference - learn my personal risk factors  - barriers to meeting goals identified - change-talk evoked - choices provided - decision-making supported - health risks reviewed - problem-solving facilitated - questions answered - readiness for change evaluated - reassurance provided - self-reflection promoted - self-reliance encouraged  Follow Up Plan: Telephone follow up appointment with care management team member scheduled for: 09-27-2020 at 145 pm       Task: RNCM: Mutually Develop and Royce Macadamia Achievement of Patient Goals Completed 08/02/2020  Outcome: Positive  Note:   Care Management Activities:    - barriers to meeting goals identified - change-talk evoked - choices provided - decision-making supported - health risks reviewed - problem-solving facilitated - questions answered - readiness for change evaluated - reassurance provided - self-reflection promoted - self-reliance encouraged         Plan:Telephone follow up appointment with care management team member scheduled for:  09-27-2020 at 145 pm  Noreene Larsson RN, MSN, [facility] Mobile: [phone]

## 2020-08-15 ENCOUNTER — Ambulatory Visit: Payer: Medicare Other | Admitting: Licensed Clinical Social Worker

## 2020-08-15 DIAGNOSIS — F331 Major depressive disorder, recurrent, moderate: Secondary | ICD-10-CM

## 2020-08-15 DIAGNOSIS — F411 Generalized anxiety disorder: Secondary | ICD-10-CM

## 2020-08-15 DIAGNOSIS — E1140 Type 2 diabetes mellitus with diabetic neuropathy, unspecified: Secondary | ICD-10-CM

## 2020-08-15 DIAGNOSIS — I10 Essential (primary) hypertension: Secondary | ICD-10-CM

## 2020-08-15 DIAGNOSIS — M1990 Unspecified osteoarthritis, unspecified site: Secondary | ICD-10-CM

## 2020-08-15 NOTE — Patient Instructions (Signed)
Visit Information   Goals Addressed               This Visit's Progress     Patient Stated     SW-Manage My Emotions (pt-stated)   On track     Follow Up Date- 08/15/20   Patient Goals: Attend all scheduled appointments with providers Contact office with any questions or concerns Continue to utilize healthy coping skills to assist in management of symptoms       SW-Matintain My Quality of Life (pt-stated)   On track     Follow Up Date-10/16/20  Patient Goals: Attend all scheduled appointments with providers Contact office with any questions or concerns Continue to utilize healthy coping skills to assist in management of symptoms       Other     SW-Track and Manage My Symptoms-Depression   On track     Timeframe:  Long-Range Goal Priority:  Medium Start Date:  04/03/20                       Expected End Date:  12/03/20                   Follow Up Date- 10/16/20   Patient Goals: Attend all scheduled appointments with providers Contact office with any questions or concerns Continue to utilize healthy coping skills to assist in management of symptoms         Patient verbalizes understanding of instructions provided today and agrees to view in [place].   Telephone follow up appointment with care management team member scheduled for:10/16/20  Christa See, MSW, [email or web address] Phone [phone] 1:36 PM

## 2020-08-15 NOTE — Chronic Care Management (AMB) (Signed)
Chronic Care Management    Clinical Social Work Note  08/15/2020 Name: Charles York MRN: [id number] DOB: 08/05/1930  Charles York is a 87 y.o. year old male who is a primary care patient of [name], DO. The CCM team was consulted to assist the patient with chronic disease management and/or care coordination needs related to: Mental Health Counseling and Resources.   Engaged with patient by telephone for follow up visit in response to provider referral for social work chronic care management and care coordination services.   Consent to Services:  The patient was given information about Chronic Care Management services, agreed to services, and gave verbal consent prior to initiation of services.  Please see initial visit note for detailed documentation.   Patient agreed to services and consent obtained.   Assessment: Patient is engaged in conversation, continues to maintain positive progress with care plan goals. He reports a decrease in his mental health symptoms, in addition, to pain management. See Care Plan below for interventions and patient self-care activities Recommendation: Patient may benefit from, and is in agreement to work with LCSW to address care coordination needs and will continue to work with the clinical team to address health care and disease management related needs.  Follow up Plan: Patient would like continued follow-up from CCM LCSW .  Follow up scheduled in 10/16/20. Patient will call office if needed prior to next encounter.   SDOH (Social Determinants of Health) assessments and interventions performed:    Advanced Directives Status: Not addressed in this encounter.  CCM Care Plan  Allergies  Allergen Reactions   Trulicity [Dulaglutide] Swelling    Swollen lips, shortness of breath,    Buspirone     Dizziness   Penicillins Itching and Rash    Has patient had a PCN reaction causing immediate rash, facial/tongue/throat swelling, SOB or  lightheadedness with hypotension: No Has patient had a PCN reaction causing severe rash involving mucus membranes or skin necrosis: No Has patient had a PCN reaction that required hospitalization: No Has patient had a PCN reaction occurring within the last 10 years: No If all of the above answers are "NO", then may proceed with Cephalosporin use.     Outpatient Encounter Medications as of 08/15/2020  Medication Sig Note   aspirin 325 MG EC tablet TK 1 T PO QD FOR UP TO 90 DAYS THEN CAN REDUCE TO 81 MG DAILY    atorvastatin (LIPITOR) 40 MG tablet Take 1 tablet (40 mg total) by mouth at bedtime.    ferrous sulfate 325 (65 FE) MG tablet Take 325 mg by mouth every other day.    gabapentin (NEURONTIN) 100 MG capsule Take 1 cap in morning, 1 cap in afternoon, and 3 caps at night.    HUMALOG 100 UNIT/ML injection ADMINISTER 12 UNITS UNDER THE SKIN TWICE DAILY WITH A MEAL    HUMALOG KWIKPEN 100 UNIT/ML KwikPen Inject 0.12 mLs (12 Units total) into the skin 2 (two) times daily with a meal. Up to 50 units/day as directed by MD    hydroxypropyl methylcellulose / hypromellose (ISOPTO TEARS / GONIOVISC) 2.5 % ophthalmic solution Place 1 drop into both eyes 4 (four) times daily.    insulin aspart protamine - aspart (NOVOLOG 70/30 MIX) (70-30) 100 UNIT/ML FlexPen Inject 12 Units into the skin 2 (two) times daily. In the morning and evening    ipratropium (ATROVENT) 0.06 % nasal spray USE 2 SPRAYS IN EACH NOSTRIL FOUR TIMES DAILY FOR UP TO 5  TO 7 DAYS THEN STOP 12/29/2018: Dropped to twice daily due to excessive dryness.   LANTUS 100 UNIT/ML injection INJECT 27 UNITS SUBCUTANEOUSLY BEFORE BREAKFAST    losartan (COZAAR) 25 MG tablet TAKE 1 TABLET(25 MG) BY MOUTH DAILY    omeprazole (PRILOSEC) 20 MG capsule TAKE 1 CAPSULE BY MOUTH TWICE A DAY BEFORE A MEAL    polyethylene glycol powder (GLYCOLAX/MIRALAX) 17 GM/SCOOP powder Take 17-34 g by mouth 2 (two) times daily as needed for mild constipation or moderate  constipation. (Patient not taking: No sig reported)    sucralfate (CARAFATE) 1 g tablet Take 1 tablet (1 g total) by mouth 4 (four) times daily.    vitamin B-12 (CYANOCOBALAMIN) 1000 MCG tablet Take 2,000 mcg by mouth daily.  (Patient not taking: No sig reported)    No facility-administered encounter medications on file as of 08/15/2020.    Patient Active Problem List   Diagnosis Date Noted   Major depressive disorder, recurrent, moderate ([place]) 10/12/2019   GAD (generalized anxiety disorder) 10/12/2019   Chronic right shoulder pain 08/17/2018   Absolute anemia 07/30/2018   Primary osteoarthritis involving multiple joints 06/15/2018   GERD (gastroesophageal reflux disease) 11/18/2016   Diabetic retinopathy ([place]) 11/18/2016   Hyperlipidemia associated with type 2 diabetes mellitus ([place]) 11/18/2016   Daytime sleepiness 07/15/2016   Fatigue 07/15/2016   Presbycusis of both ears 07/15/2016   Tinnitus of right ear 07/15/2016   Allergic rhinitis due to allergen 06/09/2016   CKD (chronic kidney disease), stage III ([place]) 02/27/2016   BPPV (benign paroxysmal positional vertigo), left 02/27/2016   Chronic pansinusitis 02/27/2016   Arthritis 07/03/2015   Colon polyp 07/03/2015   Essential hypertension 07/03/2015   High cholesterol 07/03/2015   Type 2 diabetes, controlled, with neuropathy ([place]) 07/03/2015    Conditions to be addressed/monitored: Anxiety and Depression; Mental Health Concerns   Care Plan : General Social Work (Adult)  Updates made by Rebekah [place], LCSW since 08/15/2020 12:00 AM     Problem: Coping Skills (General Plan of Care)      Long-Range Goal: Coping Skills Enhanced   Start Date: 04/03/2020  This Visit's Progress: On track  Recent Progress: On track  Priority: Medium  Note:   Timeframe:  Long-Range Goal Priority:  Medium Start Date:  04/03/20                       Expected End Date:  12/03/20                   Follow Up Date- 10/16/20   Assessment, progress  and current barriers:  Patient is currently experiencing symptoms of  anxiety and depression which seems to be exacerbated by his pain and insomnia.   Clinical Goal(s): Over the next 120 days, patient will work with SW to reduce or manage symptoms of anxiety, depression, insomnia, and mood instability until connected for ongoing counseling.  Over the next 120 days, patient will work with SW to address concerns related to gaining healthy coping skills and resource connection in order to improve mental health Clinical Interventions:  Assessed thoughts of SI, plan and access to means. Assessed patient's previous treatment, needs, coping skills, current treatment, support system and barriers to care Patient interviewed and appropriate assessments performed: brief mental health assessment Provided basic mental health support, education and interventions Patient reports that anxiety and depression symptoms continue to remain managed. Symptoms were triggered by pain, which flares occasionally  Patient reports that he  obtains an ample amount of sleep Patient denies any resource needs currently Patient continues to receive strong support from daughter who resides with patient  Patient declined the need for any mental health resource   Patient Goals: Attend all scheduled appointments with providers Contact office with any questions or concerns Continue to utilize healthy coping skills to assist in management of symptoms  Follow Up Plan:  SW will follow up with patient by phone on 08/15/20      Christa See, MSW, [email or web address] Phone [phone] 1:33 PM

## 2020-09-03 ENCOUNTER — Other Ambulatory Visit: Payer: Self-pay | Admitting: Family Medicine

## 2020-09-03 ENCOUNTER — Other Ambulatory Visit: Payer: Self-pay

## 2020-09-03 ENCOUNTER — Ambulatory Visit: Payer: Self-pay

## 2020-09-03 ENCOUNTER — Encounter: Payer: Self-pay | Admitting: Family Medicine

## 2020-09-03 ENCOUNTER — Ambulatory Visit (INDEPENDENT_AMBULATORY_CARE_PROVIDER_SITE_OTHER): Payer: Medicare Other | Admitting: Family Medicine

## 2020-09-03 VITALS — BP 127/48 | HR 84 | Ht 68.0 in | Wt 182.4 lb

## 2020-09-03 DIAGNOSIS — F331 Major depressive disorder, recurrent, moderate: Secondary | ICD-10-CM | POA: Diagnosis not present

## 2020-09-03 DIAGNOSIS — Z0000 Encounter for general adult medical examination without abnormal findings: Secondary | ICD-10-CM

## 2020-09-03 DIAGNOSIS — E1169 Type 2 diabetes mellitus with other specified complication: Secondary | ICD-10-CM

## 2020-09-03 DIAGNOSIS — E1140 Type 2 diabetes mellitus with diabetic neuropathy, unspecified: Secondary | ICD-10-CM

## 2020-09-03 DIAGNOSIS — N1831 Chronic kidney disease, stage 3a: Secondary | ICD-10-CM

## 2020-09-03 DIAGNOSIS — R351 Nocturia: Secondary | ICD-10-CM

## 2020-09-03 DIAGNOSIS — F411 Generalized anxiety disorder: Secondary | ICD-10-CM | POA: Diagnosis not present

## 2020-09-03 DIAGNOSIS — E785 Hyperlipidemia, unspecified: Secondary | ICD-10-CM

## 2020-09-03 DIAGNOSIS — I10 Essential (primary) hypertension: Secondary | ICD-10-CM

## 2020-09-03 MED ORDER — ESCITALOPRAM OXALATE 10 MG PO TABS: 10.0000 mg | ORAL_TABLET | Freq: Every day | ORAL | 2 refills | Status: DC

## 2020-09-03 MED ORDER — BUPROPION HCL ER (XL) 150 MG PO TB24: 150.0000 mg | ORAL_TABLET | Freq: Every day | ORAL | 2 refills | Status: DC

## 2020-09-03 NOTE — Telephone Encounter (Signed)
Pt. Reports shortness of breath started 2 days ago."I think it could be some anxiety." No cough or congestion. Comes and goes. No chest pain or any other symptoms. Appointment made for today.

## 2020-09-03 NOTE — Patient Instructions (Addendum)
Thank you for coming to the office today.  Start Wellbutrin XL '150mg'$  DAILY in morning, take one every day, after 2 weeks, then see how you feel, this should help the mood, improve the depression and energy.  It may not solve the panic or breathing. However NEXT after 2 weeks if doing well, START the 2nd medication LExapro (escitalopram) '10mg'$  daily also in the morning once every day. Take them both together is fine.   THERAPIST ONLY   [facility]    can do virtual as well telephone apt [address], [place], [place] [address]  < 1 mi [phone]  [facility] Address: [address] [place], [place] [address] [email or web address] Phone: [phone]  [facility] [address], [place] [address] Johnnette Litter P: [phone]  [facility], [address], [place] [address] Line: [place].   Address: [place], [place], [place] [address] Hours: Open today  9AM-7PM Phone: [phone]  [facility] [address] [place] Address: [address] [place], [place], [place] [address] Phone: [phone]  DUE for [place] (no food or drink after midnight before the lab appointment, only water or coffee without cream/sugar on the morning of)  SCHEDULE "Lab Only" visit in the morning at the clinic for lab draw in [place]   - Make sure Lab Only appointment is at about 1 week before your next appointment, so that results will be available  For Lab Results, once available within 2-3 days of blood draw, you can can log in to MyChart online to view your results and a brief explanation. Also, we can discuss results at next follow-up visit.    Please schedule a Follow-up Appointment to: Return in about 4 weeks (around 10/01/2020) for 6 weeks fasting lab only then 1 week later Annual Physical.  If you have any other questions or concerns, please  feel free to call the office or send a message through [place]. You may also schedule an earlier appointment if necessary.  Additionally, you may be receiving a survey about your experience at our office within a few days to 1 week by e-mail or mail. We value your feedback.  Nobie Putnam, DO [place]

## 2020-09-03 NOTE — Telephone Encounter (Signed)
Reason for Disposition  [1] MILD difficulty breathing (e.g., minimal/no SOB at rest, SOB with walking, pulse <100) AND [2] NEW-onset or WORSE than normal  Answer Assessment - Initial Assessment Questions 1. RESPIRATORY STATUS: "Describe your breathing?" (e.g., wheezing, shortness of breath, unable to speak, severe coughing)      Shortness of breath 2. ONSET: "When did this breathing problem begin?"      2 days ago 3. PATTERN "Does the difficult breathing come and go, or has it been constant since it started?"      Comes and goes 4. SEVERITY: "How bad is your breathing?" (e.g., mild, moderate, severe)    - MILD: No SOB at rest, mild SOB with walking, speaks normally in sentences, can lie down, no retractions, pulse < 100.    - MODERATE: SOB at rest, SOB with minimal exertion and prefers to sit, cannot lie down flat, speaks in phrases, mild retractions, audible wheezing, pulse 100-120.    - SEVERE: Very SOB at rest, speaks in single words, struggling to breathe, sitting hunched forward, retractions, pulse > 120      Mild 5. RECURRENT SYMPTOM: "Have you had difficulty breathing before?" If Yes, ask: "When was the last time?" and "What happened that time?"      Yes 6. CARDIAC HISTORY: "Do you have any history of heart disease?" (e.g., heart attack, angina, bypass surgery, angioplasty)      No 7. LUNG HISTORY: "Do you have any history of lung disease?"  (e.g., pulmonary embolus, asthma, emphysema)     No 8. CAUSE: "What do you think is causing the breathing problem?"      Anxiety 9. OTHER SYMPTOMS: "Do you have any other symptoms? (e.g., dizziness, runny nose, cough, chest pain, fever)     No 10. O2 SATURATION MONITOR:  "Do you use an oxygen saturation monitor (pulse oximeter) at home?" If Yes, "What is your reading (oxygen level) today?" "What is your usual oxygen saturation reading?" (e.g., 95%)       No 11. PREGNANCY: "Is there any chance you are pregnant?" "When was your last menstrual  period?"       No 12. TRAVEL: "Have you traveled out of the country in the last month?" (e.g., travel history, exposures)       No  Protocols used: Breathing Difficulty-A-AH

## 2020-09-03 NOTE — Progress Notes (Signed)
Subjective:    Patient ID: Charles York, male    DOB: 09/06/1930, 87 y.o.   MRN: [id number]  Charles York is a 87 y.o. male presenting on 09/03/2020 for Shortness of Breath   HPI  Major Depression, recurrent moderate GAD Anxiety Interval updates - prior history on sertraline (too strong or side effect), lexapro was successful but then came off med not needed, buspar caused dizziness  He admits major mood down and depressive symptoms and anxiety panic, he has worries about overall looking back at life and family members, he is last one left in his family.  He admits has had similar symptoms in the past with shortness of breath, he would have this periodically before and it would resolve.  Now new episode 2-3 days onset. He describes many things bother him now, he doesn't feel like he can relax anymore. He does not have much interest in anything, he says his family members have passed including wife and son and he says that he feels better if sleeping and often doesn't want to get moving.  If he gets interested in something and takes his mind off of things, he says symptoms do not bother him at all.   Depression screen [facility] 2/9 09/03/2020 01/16/2020 11/01/2019  Decreased Interest '3 1 1  '$ Down, Depressed, Hopeless 3 0 0  PHQ - 2 Score '6 1 1  '$ Altered sleeping 3 0 1  Tired, decreased energy '3 1 1  '$ Change in appetite 3 0 0  Feeling bad or failure about yourself  0 0 0  Trouble concentrating 0 0 0  Moving slowly or fidgety/restless 0 0 0  Suicidal thoughts 0 0 0  PHQ-9 Score '15 2 3  '$ Difficult doing work/chores Very difficult Not difficult at all Not difficult at all  Some recent data might be hidden   GAD 7 : Generalized Anxiety Score 09/03/2020 01/16/2020 11/01/2019 10/12/2019  Nervous, Anxious, on Edge '3 1 1 3  '$ Control/stop worrying '3 1 1 2  '$ Worry too much - different things '3 1 1 2  '$ Trouble relaxing 3 0 0 1  Restless 1 0 0 1  Easily annoyed or irritable 3 1 0 3  Afraid - awful might  happen 3 1 0 2  Total GAD 7 Score '19 5 3 14  '$ Anxiety Difficulty Somewhat difficult Not difficult at all Not difficult at all Somewhat difficult     Social History   Tobacco Use   Smoking status: Former    Types: Cigarettes    Quit date: 02/04/1983    Years since quitting: 37.6   Smokeless tobacco: Former  Scientific laboratory technician Use: Never used  Substance Use Topics   Alcohol use: No    Alcohol/week: 0.0 standard drinks   Drug use: No    Review of Systems Per HPI unless specifically indicated above     Objective:    BP (!) 127/48   Pulse 84   Ht '5\' 8"'$  (1.727 m)   Wt 182 lb 6.4 oz (82.7 kg)   SpO2 98%   BMI 27.73 kg/m   Wt Readings from Last 3 Encounters:  09/03/20 182 lb 6.4 oz (82.7 kg)  02/22/20 182 lb (82.6 kg)  01/20/20 182 lb (82.6 kg)    Physical Exam Vitals and nursing note reviewed.  Constitutional:      General: He is not in acute distress.    Appearance: He is well-developed. He is not diaphoretic.  Comments: Well-appearing, comfortable, cooperative  HENT:     Head: Normocephalic and atraumatic.  Eyes:     General:        Right eye: No discharge.        Left eye: No discharge.     Conjunctiva/sclera: Conjunctivae normal.  Neck:     Thyroid: No thyromegaly.  Cardiovascular:     Rate and Rhythm: Normal rate and regular rhythm.     Pulses: Normal pulses.     Heart sounds: Normal heart sounds. No murmur heard. Pulmonary:     Effort: Pulmonary effort is normal. No respiratory distress.     Breath sounds: Normal breath sounds. No wheezing or rales.  Musculoskeletal:        General: Normal range of motion.     Cervical back: Normal range of motion and neck supple.  Lymphadenopathy:     Cervical: No cervical adenopathy.  Skin:    General: Skin is warm and dry.     Findings: No erythema or rash.  Neurological:     Mental Status: He is alert and oriented to person, place, and time. Mental status is at baseline.  Psychiatric:        Behavior:  Behavior normal.     Comments: Well groomed, good eye contact, normal speech and thoughts     Results for orders placed or performed during the hospital encounter of 01/20/20  Protime-INR  Result Value Ref Range   Prothrombin Time 12.8 11.4 - 15.2 seconds   INR 1.0 0.8 - 1.2  APTT  Result Value Ref Range   aPTT 30 24 - 36 seconds  CBC  Result Value Ref Range   WBC 9.8 4.0 - 10.5 K/uL   RBC 4.09 (L) 4.22 - 5.81 MIL/uL   Hemoglobin 13.2 13.0 - 17.0 g/dL   HCT 39.5 39.0 - 52.0 %   MCV 96.6 80.0 - 100.0 fL   MCH 32.3 26.0 - 34.0 pg   MCHC 33.4 30.0 - 36.0 g/dL   RDW 12.6 11.5 - 15.5 %   Platelets 248 150 - 400 K/uL   nRBC 0.0 0.0 - 0.2 %  Differential  Result Value Ref Range   Neutrophils Relative % 56 %   Neutro Abs 5.5 1.7 - 7.7 K/uL   Lymphocytes Relative 35 %   Lymphs Abs 3.4 0.7 - 4.0 K/uL   Monocytes Relative 7 %   Monocytes Absolute 0.7 0.1 - 1.0 K/uL   Eosinophils Relative 1 %   Eosinophils Absolute 0.1 0.0 - 0.5 K/uL   Basophils Relative 1 %   Basophils Absolute 0.1 0.0 - 0.1 K/uL   Immature Granulocytes 0 %   Abs Immature Granulocytes 0.03 0.00 - 0.07 K/uL  Comprehensive metabolic panel  Result Value Ref Range   Sodium 135 135 - 145 mmol/L   Potassium 4.6 3.5 - 5.1 mmol/L   Chloride 100 98 - 111 mmol/L   CO2 23 22 - 32 mmol/L   Glucose, Bld 120 (H) 70 - 99 mg/dL   BUN 28 (H) 8 - 23 mg/dL   Creatinine, Ser 1.32 (H) 0.61 - 1.24 mg/dL   Calcium 9.5 8.9 - 10.3 mg/dL   Total Protein 7.5 6.5 - 8.1 g/dL   Albumin 4.2 3.5 - 5.0 g/dL   AST 15 15 - 41 U/L   ALT 18 0 - 44 U/L   Alkaline Phosphatase 77 38 - 126 U/L   Total Bilirubin 0.9 0.3 - 1.2 mg/dL   GFR, Estimated 52 (  L) >60 mL/min   Anion gap 12 5 - 15  CBG monitoring, ED  Result Value Ref Range   Glucose-Capillary 117 (H) 70 - 99 mg/dL  Troponin I (High Sensitivity)  Result Value Ref Range   Troponin I (High Sensitivity) 6 <18 ng/L      Assessment & Plan:   Problem List Items Addressed This Visit      Major depressive disorder, recurrent, moderate (HCC) - Primary   Relevant Medications   buPROPion (WELLBUTRIN XL) 150 MG 24 hr tablet   escitalopram (LEXAPRO) 10 MG tablet   GAD (generalized anxiety disorder)   Relevant Medications   buPROPion (WELLBUTRIN XL) 150 MG 24 hr tablet   escitalopram (LEXAPRO) 10 MG tablet    Worsening flare mood and anxiety Some panic attack symptoms lately with anxiety Multiple stressors Prior treatments see HPI  Start new therapy for mood, Wellbutrin XL '150mg'$  daily in AM - goal to control mood that may be causing anxiety. After 2-4 weeks add back Escitalopram SSRI '10mg'$  daily to help anxiety and work w/ wellbutrin for mood. Avoid buspar due to dizziness Caution benadryl hydroxyzine due to anti cholinergic effect in elderly patient Limited other med options Recommend counseling CBT therapy, gave handout list and he may look into these and call or ask [place] if need help  Will coordinate with CCM team to update them  Meds ordered this encounter  Medications   buPROPion (WELLBUTRIN XL) 150 MG 24 hr tablet    Sig: Take 1 tablet (150 mg total) by mouth daily.    Dispense:  30 tablet    Refill:  2   escitalopram (LEXAPRO) 10 MG tablet    Sig: Take 1 tablet (10 mg total) by mouth daily.    Dispense:  30 tablet    Refill:  2    Follow up plan: Return in about 4 weeks (around 10/01/2020) for 6 weeks fasting lab only then 1 week later Annual Physical.  Future lab orders in for 10/12/20   Nobie Putnam, [facility] 09/03/2020, 11:09 AM

## 2020-09-06 ENCOUNTER — Ambulatory Visit (INDEPENDENT_AMBULATORY_CARE_PROVIDER_SITE_OTHER): Payer: Medicare Other | Admitting: General Practice

## 2020-09-06 DIAGNOSIS — F331 Major depressive disorder, recurrent, moderate: Secondary | ICD-10-CM | POA: Diagnosis not present

## 2020-09-06 DIAGNOSIS — E1140 Type 2 diabetes mellitus with diabetic neuropathy, unspecified: Secondary | ICD-10-CM

## 2020-09-06 DIAGNOSIS — Z23 Encounter for immunization: Secondary | ICD-10-CM

## 2020-09-06 DIAGNOSIS — I10 Essential (primary) hypertension: Secondary | ICD-10-CM

## 2020-09-06 DIAGNOSIS — E785 Hyperlipidemia, unspecified: Secondary | ICD-10-CM | POA: Diagnosis not present

## 2020-09-06 DIAGNOSIS — E1169 Type 2 diabetes mellitus with other specified complication: Secondary | ICD-10-CM

## 2020-09-06 DIAGNOSIS — F411 Generalized anxiety disorder: Secondary | ICD-10-CM

## 2020-09-06 DIAGNOSIS — F419 Anxiety disorder, unspecified: Secondary | ICD-10-CM

## 2020-09-06 NOTE — Chronic Care Management (AMB) (Signed)
Chronic Care Management   CCM RN Visit Note  09/06/2020 Name: Charles York MRN: [id number] DOB: 12-01-1930  Subjective: Charles York is a 87 y.o. year old male who is a primary care patient of [name], DO. The care management team was consulted for assistance with disease management and care coordination needs.    Engaged with patient by telephone for follow up visit in response to provider referral for case management and/or care coordination services.   Consent to Services:  The patient was given information about Chronic Care Management services, agreed to services, and gave verbal consent prior to initiation of services.  Please see initial visit note for detailed documentation.   Patient agreed to services and verbal consent obtained.   Assessment: Review of patient past medical history, allergies, medications, health status, including review of consultants reports, laboratory and other test data, was performed as part of comprehensive evaluation and provision of chronic care management services.   SDOH (Social Determinants of Health) assessments and interventions performed:  SDOH Interventions    Flowsheet Row Most Recent Value  SDOH Interventions   Physical Activity Interventions Other (Comments)  [is working out on his exercise equipment daily, walks when it is cooler outside]  Social Connections Interventions Other (Comment)  [daughter lives with the patient, the patient misses his wife and son]        CCM Care Plan  Allergies  Allergen Reactions   Trulicity [Dulaglutide] Swelling    Swollen lips, shortness of breath,    Buspirone     Dizziness   Penicillins Itching and Rash    Has patient had a PCN reaction causing immediate rash, facial/tongue/throat swelling, SOB or lightheadedness with hypotension: No Has patient had a PCN reaction causing severe rash involving mucus membranes or skin necrosis: No Has patient had a PCN reaction that required  hospitalization: No Has patient had a PCN reaction occurring within the last 10 years: No If all of the above answers are "NO", then may proceed with Cephalosporin use.     Outpatient Encounter Medications as of 09/06/2020  Medication Sig Note   aspirin 325 MG EC tablet TK 1 T PO QD FOR UP TO 90 DAYS THEN CAN REDUCE TO 81 MG DAILY    atorvastatin (LIPITOR) 40 MG tablet Take 1 tablet (40 mg total) by mouth at bedtime.    buPROPion (WELLBUTRIN XL) 150 MG 24 hr tablet Take 1 tablet (150 mg total) by mouth daily.    escitalopram (LEXAPRO) 10 MG tablet Take 1 tablet (10 mg total) by mouth daily.    ferrous sulfate 325 (65 FE) MG tablet Take 325 mg by mouth every other day.    gabapentin (NEURONTIN) 100 MG capsule Take 1 cap in morning, 1 cap in afternoon, and 3 caps at night.    HUMALOG 100 UNIT/ML injection ADMINISTER 12 UNITS UNDER THE SKIN TWICE DAILY WITH A MEAL    HUMALOG KWIKPEN 100 UNIT/ML KwikPen Inject 0.12 mLs (12 Units total) into the skin 2 (two) times daily with a meal. Up to 50 units/day as directed by MD    hydroxypropyl methylcellulose / hypromellose (ISOPTO TEARS / GONIOVISC) 2.5 % ophthalmic solution Place 1 drop into both eyes 4 (four) times daily.    insulin aspart protamine - aspart (NOVOLOG 70/30 MIX) (70-30) 100 UNIT/ML FlexPen Inject 12 Units into the skin 2 (two) times daily. In the morning and evening    ipratropium (ATROVENT) 0.06 % nasal spray USE 2 SPRAYS IN [facility]  NOSTRIL FOUR TIMES DAILY FOR UP TO 5 TO 7 DAYS THEN STOP 12/29/2018: Dropped to twice daily due to excessive dryness.   LANTUS 100 UNIT/ML injection INJECT 27 UNITS SUBCUTANEOUSLY BEFORE BREAKFAST    losartan (COZAAR) 25 MG tablet TAKE 1 TABLET(25 MG) BY MOUTH DAILY    omeprazole (PRILOSEC) 20 MG capsule TAKE 1 CAPSULE BY MOUTH TWICE A DAY BEFORE A MEAL    polyethylene glycol powder (GLYCOLAX/MIRALAX) 17 GM/SCOOP powder Take 17-34 g by mouth 2 (two) times daily as needed for mild constipation or moderate  constipation.    sucralfate (CARAFATE) 1 g tablet Take 1 tablet (1 g total) by mouth 4 (four) times daily.    vitamin B-12 (CYANOCOBALAMIN) 1000 MCG tablet Take 2,000 mcg by mouth daily.    No facility-administered encounter medications on file as of 09/06/2020.    Patient Active Problem List   Diagnosis Date Noted   Major depressive disorder, recurrent, moderate ([place]) 10/12/2019   GAD (generalized anxiety disorder) 10/12/2019   Chronic right shoulder pain 08/17/2018   Absolute anemia 07/30/2018   Primary osteoarthritis involving multiple joints 06/15/2018   GERD (gastroesophageal reflux disease) 11/18/2016   Diabetic retinopathy ([place]) 11/18/2016   Hyperlipidemia associated with type 2 diabetes mellitus ([place]) 11/18/2016   Daytime sleepiness 07/15/2016   Fatigue 07/15/2016   Presbycusis of both ears 07/15/2016   Tinnitus of right ear 07/15/2016   Allergic rhinitis due to allergen 06/09/2016   CKD (chronic kidney disease), stage III (HCC) 02/27/2016   BPPV (benign paroxysmal positional vertigo), left 02/27/2016   Chronic pansinusitis 02/27/2016   Arthritis 07/03/2015   Colon polyp 07/03/2015   Essential hypertension 07/03/2015   High cholesterol 07/03/2015   Type 2 diabetes, controlled, with neuropathy ([place]) 07/03/2015    Conditions to be addressed/monitored:HTN, HLD, DMII, Anxiety, and Depression  Care Plan : RNCM: Diabetes Type 2 (Adult)  Updates made by Vanita Ingles since 09/06/2020 12:00 AM     Problem: RNCM: Glycemic Management (Diabetes, Type 2)   Priority: Medium     Long-Range Goal: RNCM: Glycemic Management Optimized   Start Date: 03/22/2020  Expected End Date: 03/30/2021  This Visit's Progress: On track  Recent Progress: On track  Priority: Medium  Note:   Objective:  Lab Results  Component Value Date   HGBA1C 7.6 (H) 09/12/2019     Lab Results  Component Value Date   CREATININE 1.32 (H) 01/20/2020   CREATININE 1.30 (H) 10/05/2019   CREATININE 1.40 (H)  09/12/2019   No results found for: EGFR Current Barriers:  Knowledge Deficits related to basic Diabetes pathophysiology and self care/management Knowledge Deficits related to medications used for management of diabetes Lacks social connections Does not contact provider office for questions/concerns Case Manager Clinical Goal(s):  patient will demonstrate improved adherence to prescribed treatment plan for diabetes self care/management as evidenced by: daily monitoring and recording of CBG  adherence to ADA/ carb modified diet adherence to prescribed medication regimen contacting provider for new or worsened symptoms or questions Interventions:  Collaboration with Parks Ranger, Devonne Doughty, DO regarding development and update of comprehensive plan of care as evidenced by provider attestation and co-signature Inter-disciplinary care team collaboration (see longitudinal plan of care) Provided education to patient about basic DM disease process. Discussed fasting  blood sugar goal <130 and post prandial of <180 Reviewed medications with patient and discussed importance of medication adherence. 8-4--2022: The patient is compliant with medications. The patient denies any medication needs. Discussed plans with patient for ongoing care management  follow up and provided patient with direct contact information for care management team Provided patient with written educational materials related to hypo and hyperglycemia and importance of correct treatment. 09-06-2020: Denies any lows at this time. States he is doing well and eating good.  Advised patient, providing education and rationale, to check cbg bid and record, calling pcp for findings outside established parameters.  09-06-2020: The patient states that his blood sugars BID,  fasting are 130-135 consistently. The patient states his blood sugars likely will be elevated tonight as he is cooking steak on the grill and having some cherry pie. The patient will  have hemoglobin A1C check in September with additional blood work.  Review of patient status, including review of consultants reports, relevant laboratory and other test results, and medications completed. Patient Goals/Self-Care Activities - Self administers oral medications as prescribed Self administers insulin as prescribed Attends all scheduled provider appointments Checks blood sugars as prescribed and utilize hyper and hypoglycemia protocol as needed Adheres to prescribed ADA/carb modified - barriers to adherence to treatment plan identified - blood glucose monitoring encouraged - blood glucose readings reviewed - mutual A1C goal set or reviewed - resources required to improve adherence to care identified - self-awareness of signs/symptoms of hypo or hyperglycemia encouraged - use of blood glucose monitoring log promoted Follow Up Plan: Telephone follow up appointment with care management team member scheduled for: 10-29-2020 at 145pm    Care Plan : RNCM: Hypertension (Adult)  Updates made by Vanita Ingles since 09/06/2020 12:00 AM     Problem: RNCM: Hypertension (Hypertension)   Priority: Medium     Long-Range Goal: RNCM: Hypertension Monitored   Start Date: 03/22/2020  Expected End Date: 03/30/2021  This Visit's Progress: On track  Recent Progress: On track  Priority: Medium  Note:   Objective:  Last practice recorded BP readings:  BP Readings from Last 3 Encounters:  09/03/20 (!) 127/48  02/22/20 (!) 152/61  01/20/20 121/88    Most recent eGFR/CrCl: No results found for: EGFR  No components found for: CRCL Current Barriers:  Knowledge Deficits related to basic understanding of hypertension pathophysiology and self care management Knowledge Deficits related to understanding of medications prescribed for management of hypertension Unable to independently manage HTN Does not contact provider office for questions/concerns Case Manager Clinical Goal(s):  patient will  verbalize understanding of plan for hypertension management patient will demonstrate improved adherence to prescribed treatment plan for hypertension as evidenced by taking all medications as prescribed, monitoring and recording blood pressure as directed, adhering to low sodium/DASH diet patient will demonstrate improved health management independence as evidenced by checking blood pressure as directed and notifying PCP if SBP>150 or DBP > 90, taking all medications as prescribe, and adhering to a low sodium diet as discussed. patient will verbalize basic understanding of hypertension disease process and self health management plan as evidenced by compliance with heart healthy/ADA diet, compliance with medications and working with the CCM team to optimize health and well being.  Interventions:  Collaboration with [name], DO regarding development and update of comprehensive plan of care as evidenced by provider attestation and co-signature Inter-disciplinary care team collaboration (see longitudinal plan of care) Evaluation of current treatment plan related to hypertension self management and patient's adherence to plan as established by provider. 09-06-2020: The patient is doing well with management of his blood pressures. Denies any issues related to HTN. Will continue to monitor.  Provided education to patient re: stroke prevention, s/s of heart  attack and stroke, DASH diet, complications of uncontrolled blood pressure Reviewed medications with patient and discussed importance of compliance. 09-06-2020: The patient is compliant with medications  Discussed plans with patient for ongoing care management follow up and provided patient with direct contact information for care management team Advised patient, providing education and rationale, to monitor blood pressure daily and record, calling PCP for findings outside established parameters.  Self-Care Activities: - Self administers  medications as prescribed Attends all scheduled provider appointments Calls provider office for new concerns, questions, or BP outside discussed parameters Checks BP and records as discussed Follows a low sodium diet/DASH diet Patient Goals: - check blood pressure weekly - choose a place to take my blood pressure (home, clinic or office, retail store) - write blood pressure results in a log or diary - agree on reward when goals are met - agree to work together to make changes - ask questions to understand - have a family meeting to talk about healthy habits - learn about high blood pressure  Follow Up Plan: Telephone follow up appointment with care management team member scheduled for: 10-29-2020 at 145 pm    Care Plan : RNCM: HLD Management  Updates made by Vanita Ingles since 09/06/2020 12:00 AM     Problem: RNCM: HLD Management   Priority: Medium     Long-Range Goal: RNCM: HLD Management   Start Date: 03/22/2020  Expected End Date: 03/30/2021  This Visit's Progress: On track  Recent Progress: On track  Priority: Medium  Note:   Current Barriers:  Poorly controlled hyperlipidemia, complicated by HTN, DM, shoulder pain, depression and anxiety  Current antihyperlipidemic regimen: Lipitor 40 mg QD Most recent lipid panel:     Component Value Date/Time   CHOL 147 09/12/2019 0757   TRIG 125 09/12/2019 0757   HDL 51 09/12/2019 0757   CHOLHDL 2.9 09/12/2019 0757   LDLCALC 75 09/12/2019 0757   ASCVD risk enhancing conditions: age >66, DM, HTN, CKD,  former smoker Lacks Scientist, product/process development Does not contact provider office for Health visitor Clinical Goal(s):  Over the next 120 days, patient will work with Consulting civil engineer, providers, and care team towards execution of optimized self-health management plan patient will verbalize understanding of plan for HLD patient will work with [place] and pcp to address needs related to effective management of HLD    Interventions: Collaboration with Parks Ranger, Devonne Doughty, DO regarding development and update of comprehensive plan of care as evidenced by provider attestation and co-signature Inter-disciplinary care team collaboration (see longitudinal plan of care) Medication review performed; medication list updated in electronic medical record.  Inter-disciplinary care team collaboration (see longitudinal plan of care) Referred to pharmacy team for assistance with HLD medication management Evaluation of current treatment plan related to HLD  and patient's adherence to plan as established by provider. 09-06-2020: The patient denies any issues at this time. Is compliant with plan of care. Will have new blood work coming up in September of 2022 Advised patient to call the office for changes or questions Provided education to patient re: heart healthy/ADA diet, taking medications as prescribed and working with CCM team to optimize health and well being. Education on activity level and safety.  Discussed plans with patient for ongoing care management follow up and provided patient with direct contact information for care management team   Patient Goals/Self-Care Activities: Over the next 120 days, patient will:   - call for medicine refill 2 or 3 days before  it runs out - call if I am sick and can't take my medicine - keep a list of all the medicines I take; vitamins and herbals too - learn to read medicine labels - use a pillbox to sort medicine - use an alarm clock or phone to remind me to take my medicine - change to whole grain breads, cereal, pasta - drink 6 to 8 glasses of water each day - eat 3 to 5 servings of fruits and vegetables each day - eat 5 or 6 small meals each day - fill half the plate with nonstarchy vegetables - limit fast food meals to no more than 1 per week - manage portion size - prepare main meal at home 3 to 5 days each week - read food labels for fat, fiber, carbohydrates  and portion size - be open to making changes - I can manage, know and watch for signs of a heart attack - if I have chest pain, call for help - learn about small changes that will make a big difference - learn my personal risk factors  - barriers to meeting goals identified - change-talk evoked - choices provided - decision-making supported - health risks reviewed - problem-solving facilitated - questions answered - readiness for change evaluated - reassurance provided - self-reflection promoted - self-reliance encouraged  Follow Up Plan: Telephone follow up appointment with care management team member scheduled for: 10-29-2020 at 145 pm       Care Plan : RNCM: Depression (Adult) and Anxiety  Updates made by Vanita Ingles since 09/06/2020 12:00 AM     Problem: RNCM: Symptoms (Depression) and Anxiety   Priority: High  Onset Date: 09/06/2020     Long-Range Goal: Symptoms Monitored and Managed   Start Date: 09/06/2020  Expected End Date: 09/06/2021  This Visit's Progress: On track  Priority: High  Note:    Current Barriers:  Ineffective Self Health Maintenance in a patient with Anxiety and Depression Unable to independently manage reoccurrence of depression and anxiety Lacks social connections Clinical Goal(s):  Collaboration with Parks Ranger, Devonne Doughty, DO regarding development and update of comprehensive plan of care as evidenced by provider attestation and co-signature Inter-disciplinary care team collaboration (see longitudinal plan of care) patient will work with care management team to address care coordination and chronic disease management needs related to Disease Management Educational Needs Care Coordination Mental Health Counseling   Interventions:  Evaluation of current treatment plan related to Anxiety and Depression, Limited social support and Mental Health Concerns  self-management and patient's adherence to plan as established by provider. Collaboration with  [name], DO regarding development and update of comprehensive plan of care as evidenced by provider attestation       and co-signature Inter-disciplinary care team collaboration (see longitudinal plan of care) Discussed plans with patient for ongoing care management follow up and provided patient with direct contact information for care management team Self Care Activities:  Patient verbalizes understanding of plan to effectively manage depression and anxiety Self administers medications as prescribed Attends all scheduled provider appointments Calls pharmacy for medication refills Attends church or other social activities Performs ADL's independently Performs IADL's independently Calls provider office for new concerns or questions Patient Goals: - activity or exercise based on tolerance encouraged - emotional support provided - healthy lifestyle promoted - medication side effects monitored and managed - pain managed - participation in mental health treatment encouraged - quality of sleep assessed - response to mental health treatment monitored - response  to pharmacologic therapy monitored - sleep hygiene techniques encouraged - social activities and relationships encouraged Follow Up Plan: Telephone follow up appointment with care management team member scheduled for: 10-29-2020 at 1:45 pm    Task: RNCM: Alleviate Barriers to Depression Treatment Completed 09/06/2020  Outcome: Positive  Note:   Care Management Activities:    - activity or exercise based on tolerance encouraged - emotional support provided - healthy lifestyle promoted - medication side effects monitored and managed - pain managed - participation in mental health treatment encouraged - quality of sleep assessed - response to mental health treatment monitored - response to pharmacologic therapy monitored - sleep hygiene techniques encouraged - social activities and relationships encouraged          Plan:Telephone follow up appointment with care management team member scheduled for:  10-29-2020 at 1:45 pm  Noreene Larsson RN, MSN, [place] Mobile: [phone]

## 2020-09-06 NOTE — Patient Instructions (Signed)
Visit Information  PATIENT GOALS:  Goals Addressed             This Visit's Progress    RNCM: Track and Manage My Blood Pressure-Hypertension       Timeframe:  Long-Range Goal Priority:  Medium Start Date:  08-02-2020                           Expected End Date:      08-02-2021                 Follow Up Date 10-29-2020    - check blood pressure weekly - choose a place to take my blood pressure (home, clinic or office, retail store) - write blood pressure results in a log or diary    Why is this important?   You won't feel high blood pressure, but it can still hurt your blood vessels.  High blood pressure can cause heart or kidney problems. It can also cause a stroke.  Making lifestyle changes like losing a little weight or eating less salt will help.  Checking your blood pressure at home and at different times of the day can help to control blood pressure.  If the doctor prescribes medicine remember to take it the way the doctor ordered.  Call the office if you cannot afford the medicine or if there are questions about it.     Notes: The patient states he checks his blood pressure at least twice a week to see how it is doing. He says it usually runs [id number] to [id number] systolic and 58 to 68 diastolic. Denies any issues with orthostatic hypotension. 09-06-2020: The patient denies issues with blood pressures at this time. Is stable. BP in the office recently was 127/48. Denies any issues with orthostatic hypotension. Will continue to monitor.         Patient verbalizes understanding of instructions provided today and agrees to view in [place].   Telephone follow up appointment with care management team member scheduled for: 10-29-2020 at 1:45 pm  Noreene Larsson RN, MSN, [place] Mobile: [phone]

## 2020-09-11 ENCOUNTER — Ambulatory Visit (INDEPENDENT_AMBULATORY_CARE_PROVIDER_SITE_OTHER): Payer: Medicare Other

## 2020-09-11 VITALS — Ht 69.0 in | Wt 182.0 lb

## 2020-09-11 DIAGNOSIS — Z0000 Encounter for general adult medical examination without abnormal findings: Secondary | ICD-10-CM

## 2020-09-11 NOTE — Progress Notes (Signed)
I connected with Jacki Cones today by telephone and verified that I am speaking with the correct person using two identifiers. Location patient: home Location provider: work Persons participating in the virtual visit: [place], Cristian LPN.   I discussed the limitations, risks, security and privacy concerns of performing an evaluation and management service by telephone and the availability of in person appointments. I also discussed with the patient that there may be a patient responsible charge related to this service. The patient expressed understanding and verbally consented to this telephonic visit.    Interactive audio and video telecommunications were attempted between this provider and patient, however failed, due to patient having technical difficulties OR patient did not have access to video capability.  We continued and completed visit with audio only.     Vital signs may be patient reported or missing.  Subjective:   Charles York is a 87 y.o. male who presents for Medicare Annual/Subsequent preventive examination.  Review of Systems     Cardiac Risk Factors include: advanced age (>30mn, >>33women);diabetes mellitus;dyslipidemia;hypertension;male gender     Objective:    Today's Vitals   09/11/20 1056  Weight: 182 lb (82.6 kg)  Height: '5\' 9"'$  (1.753 m)   Body mass index is 26.88 kg/m.  Advanced Directives 09/11/2020 01/20/2020 10/05/2019 09/06/2019 06/24/2019 12/29/2018 03/30/2018  Does Patient Have a Medical Advance Directive? Yes No No No No No No  Type of AParamedicof AEdith EndaveLiving will - - - - - -  Copy of HCamp [place] Chart? No - copy requested - - - - - -  Would patient like information on creating a medical advance directive? - - - - No - Patient declined - No - Patient declined    Current Medications (verified) Outpatient Encounter Medications as of 09/11/2020  Medication Sig   aspirin 325 MG EC tablet TK 1 T  PO QD FOR UP TO 90 DAYS THEN CAN REDUCE TO 81 MG DAILY   atorvastatin (LIPITOR) 40 MG tablet Take 1 tablet (40 mg total) by mouth at bedtime.   buPROPion (WELLBUTRIN XL) 150 MG 24 hr tablet Take 1 tablet (150 mg total) by mouth daily.   escitalopram (LEXAPRO) 10 MG tablet Take 1 tablet (10 mg total) by mouth daily.   ferrous sulfate 325 (65 FE) MG tablet Take 325 mg by mouth every other day.   HUMALOG 100 UNIT/ML injection ADMINISTER 12 UNITS UNDER THE SKIN TWICE DAILY WITH A MEAL   HUMALOG KWIKPEN 100 UNIT/ML KwikPen Inject 0.12 mLs (12 Units total) into the skin 2 (two) times daily with a meal. Up to 50 units/day as directed by MD   hydroxypropyl methylcellulose / hypromellose (ISOPTO TEARS / GONIOVISC) 2.5 % ophthalmic solution Place 1 drop into both eyes 4 (four) times daily.   insulin aspart protamine - aspart (NOVOLOG 70/30 MIX) (70-30) 100 UNIT/ML FlexPen Inject 12 Units into the skin 2 (two) times daily. In the morning and evening   LANTUS 100 UNIT/ML injection INJECT 27 UNITS SUBCUTANEOUSLY BEFORE BREAKFAST   losartan (COZAAR) 25 MG tablet TAKE 1 TABLET(25 MG) BY MOUTH DAILY   omeprazole (PRILOSEC) 20 MG capsule TAKE 1 CAPSULE BY MOUTH TWICE A DAY BEFORE A MEAL   sucralfate (CARAFATE) 1 g tablet Take 1 tablet (1 g total) by mouth 4 (four) times daily.   gabapentin (NEURONTIN) 100 MG capsule Take 1 cap in morning, 1 cap in afternoon, and 3 caps at night. (Patient not taking:  Reported on 09/11/2020)   ipratropium (ATROVENT) 0.06 % nasal spray USE 2 SPRAYS IN EACH NOSTRIL FOUR TIMES DAILY FOR UP TO 5 TO 7 DAYS THEN STOP (Patient not taking: Reported on 09/11/2020)   polyethylene glycol powder (GLYCOLAX/MIRALAX) 17 GM/SCOOP powder Take 17-34 g by mouth 2 (two) times daily as needed for mild constipation or moderate constipation. (Patient not taking: Reported on 09/11/2020)   vitamin B-12 (CYANOCOBALAMIN) 1000 MCG tablet Take 2,000 mcg by mouth daily. (Patient not taking: Reported on 09/11/2020)   No  facility-administered encounter medications on file as of 09/11/2020.    Allergies (verified) Trulicity [dulaglutide], Buspirone, and Penicillins   History: Past Medical History:  Diagnosis Date   Anxiety    Arthritis    Cancer ([place])    skin   Chronic kidney disease    stage 3   Colon polyp    Diabetes mellitus without complication ([place])    GERD (gastroesophageal reflux disease)    Heartburn    Hyperlipemia    Hypertension    Obesity    Past Surgical History:  Procedure Laterality Date   CHOLECYSTECTOMY N/A 02/04/2018   Procedure: LAPAROSCOPIC CHOLECYSTECTOMY;  Surgeon: Johnathan Hausen, MD;  Location: [facility] ORS;  Service: General;  Laterality: N/A;   EYE SURGERY  11/2017   fluid in back of eye/ injection every 3 months. 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    Comment: retired  Tobacco Use   Smoking status: Former    Types: Cigarettes    Quit date: 02/04/1983    Years since quitting: 37.6   Smokeless tobacco: Former  Scientific laboratory technician Use: Never used  Substance and Sexual Activity   Alcohol use: No    Alcohol/week: 0.0 standard drinks   Drug use: No   Sexual activity: Not on file  Other Topics Concern   Not on file  Social History Narrative   Not on file   Social Determinants of Health   Financial Resource Strain: Low Risk    Difficulty of Paying Living Expenses: Not hard at all  Food Insecurity: No Food Insecurity   Worried About Charity fundraiser in the Last Year: Never true   Arboriculturist in the Last Year: Never true  Transportation Needs: No  Transportation Needs   Lack of Transportation (Medical): No   Lack of Transportation (Non-Medical): No  Physical Activity: Insufficiently Active   Days of Exercise per Week: 4 days   Minutes of Exercise per Session: 30 min  Stress: No Stress Concern Present   Feeling of Stress : Not at all  Social Connections: Socially Isolated   Frequency of Communication with Friends and Family: More than three times a week   Frequency of Social Gatherings with Friends and Family: More than three times a week   Attends Religious Services: Never   Marine scientist or Organizations: No   Attends Archivist Meetings: Never   Marital Status: Widowed  Tobacco Counseling Counseling given: Not Answered   Clinical Intake:  Pre-visit preparation completed: Yes  Pain : No/denies pain     Nutritional Status: BMI 25 -29 Overweight Nutritional Risks: None Diabetes: Yes  How often do you need to have someone help you when you read instructions, pamphlets, or other written materials from your doctor or pharmacy?: 1 - Never What is the last grade level you completed in school?: 12th grade  Diabetic? Yes .Nutrition Risk Assessment:  Has the patient had any N/V/D within the last 2 months?  No  Does the patient have any non-healing wounds?  No  Has the patient had any unintentional weight loss or weight gain?  No   Diabetes:  Is the patient diabetic?  Yes  If diabetic, was a CBG obtained today?  No  Did the patient bring in their glucometer from home?  No  How often do you monitor your CBG's? daily.   Financial Strains and Diabetes Management:  Are you having any financial strains with the device, your supplies or your medication? No .  Does the patient want to be seen by Chronic Care Management for management of their diabetes?  No  Would the patient like to be referred to a Nutritionist or for Diabetic Management?  No   Diabetic Exams:  Diabetic Eye Exam: Completed  09/19/2019 Diabetic Foot Exam: Overdue, Pt has been advised about the importance in completing this exam. Pt is scheduled for diabetic foot exam on next appointment.   Interpreter Needed?: No  Information entered by :: NAllen LPN   Activities of Daily Living In your present state of health, do you have any difficulty performing the following activities: 09/11/2020  Hearing? Y  Comment hearing aides  Vision? Y  Comment decreased vision right eye  Difficulty concentrating or making decisions? N  Walking or climbing stairs? N  Dressing or bathing? N  Doing errands, shopping? Y  Comment due to transportation Pensions consultant and eating ? N  Using the Toilet? N  In the past six months, have you accidently leaked urine? N  Do you have problems with loss of bowel control? N  Managing your Medications? N  Managing your Finances? N  Housekeeping or managing your Housekeeping? N  Some recent data might be hidden    Patient Care Team: [name], DO as PCP - General (Family Medicine) Vanita Ingles, RN as Case Manager ([place]) Rebekah Chesterfield, LCSW as Social Worker (Licensed Clinical Social Worker)  Indicate any recent [employer] you may have received from other than [facility] providers in the past year (date may be approximate).     Assessment:   This is a routine wellness examination for Kenenna.  Hearing/Vision screen Vision Screening - Comments:: [facility]  Dietary issues and exercise activities discussed: Current Exercise Habits: Home exercise routine, Type of exercise: walking;strength training/weights, Time (Minutes): 30, Frequency (Times/Week): 4, Weekly Exercise (Minutes/Week): 120   Goals Addressed             This Visit's Progress    Patient Stated       09/11/2020, no goals       Depression Screen PHQ 2/9 Scores 09/11/2020 09/03/2020 01/16/2020 11/01/2019 10/12/2019 09/19/2019 09/06/2019  PHQ - 2 Score 0 '6 1 1 5 '$ 0 0  PHQ- 9  Score - '15 2 3 13 '$ - -  Exception Documentation - - - - - - -    Fall Risk Fall Risk  09/11/2020 01/16/2020 09/19/2019 09/06/2019 01/07/2019  Falls in the past year? 0 0 0 0 0  Number falls in past yr: - 0 0 - 0  Injury with Fall? - 0 0 - 0  Risk for fall due to : Impaired balance/gait - - Medication side effect -  Risk for fall due to: Comment - - - - -  Follow up Falls evaluation completed;Education provided;Falls prevention discussed Falls evaluation completed Falls evaluation completed Falls evaluation completed;Education provided;Falls prevention discussed Falls evaluation completed    FALL RISK PREVENTION PERTAINING TO THE HOME:  Any stairs in or around the home? Yes  If so, are there any without handrails? No  Home free of loose throw rugs in walkways, pet beds, electrical cords, etc? Yes  Adequate lighting in your home to reduce risk of falls? Yes   ASSISTIVE DEVICES UTILIZED TO PREVENT FALLS:  Life alert? No  Use of a cane, walker or w/c? No  Grab bars in the bathroom? Yes  Shower chair or bench in shower? Yes  Elevated toilet seat or a handicapped toilet? No   TIMED UP AND GO:  Was the test performed? No .      Cognitive Function:     6CIT Screen 09/11/2020 09/06/2019 03/30/2018 11/04/2016  What Year? 0 points 0 points 0 points 0 points  What month? 0 points 0 points 0 points 0 points  What time? 0 points 0 points 0 points 3 points  Count back from 20 0 points 0 points 0 points 0 points  Months in reverse 0 points 0 points 0 points 0 points  Repeat phrase 6 points 0 points 0 points 2 points  Total Score 6 0 0 5    Immunizations Immunization History  Administered Date(s) Administered   Fluad Quad(high Dose 65+) 10/14/2018, 10/12/2019   Influenza-Unspecified 10/29/2015, 10/25/2016, 11/24/2017   PFIZER(Purple Top)SARS-COV-2 Vaccination 03/03/2019, 03/28/2019   Pneumococcal Conjugate-13 05/07/2016   Pneumococcal Polysaccharide-23 11/24/2017   Zoster Recombinat  (Shingrix) 05/07/2016, 10/27/2016    TDAP status: Due, Education has been provided regarding the importance of this vaccine. Advised may receive this vaccine at local pharmacy or Health Dept. Aware to provide a copy of the vaccination record if obtained from local pharmacy or Health Dept. Verbalized acceptance and understanding.  Flu Vaccine status: Due, Education has been provided regarding the importance of this vaccine. Advised may receive this vaccine at local pharmacy or Health Dept. Aware to provide a copy of the vaccination record if obtained from local pharmacy or Health Dept. Verbalized acceptance and understanding.  Pneumococcal vaccine status: Up to date  Covid-19 vaccine status: Completed vaccines  Qualifies for Shingles Vaccine? Yes   Zostavax completed No   Shingrix Completed?: Yes  Screening Tests Health Maintenance  Topic Date Due   OPHTHALMOLOGY EXAM  04/03/2016   TETANUS/TDAP  02/04/2020   COVID-19 Vaccine (4 - Booster for Pfizer series) 02/22/2020   HEMOGLOBIN A1C  03/14/2020   FOOT EXAM  03/20/2020   INFLUENZA VACCINE  09/03/2020   PNA vac Low Risk Adult  Completed   Zoster Vaccines- Shingrix  Completed   HPV VACCINES  Aged Out    Health Maintenance  Health Maintenance Due  Topic Date Due   OPHTHALMOLOGY EXAM  04/03/2016   TETANUS/TDAP  02/04/2020   COVID-19 Vaccine (4 - Booster for Pfizer series) 02/22/2020   HEMOGLOBIN A1C  03/14/2020   FOOT EXAM  03/20/2020   INFLUENZA VACCINE  09/03/2020    Colorectal cancer screening: No longer required.  Lung Cancer Screening: (Low Dose CT Chest recommended if Age 31-80 years, 30 pack-year currently smoking OR have quit w/in 15years.) does not qualify.   Lung Cancer Screening Referral: no  Additional Screening:  Hepatitis C Screening: does not qualify;   Vision Screening: Recommended annual ophthalmology exams for early detection of glaucoma and other disorders of the eye. Is the patient up to date with  their annual eye exam?  Yes  Who is the provider or what is the name of the office in which the patient attends [facility] exams? [facility] If pt is not established with a provider, would they like to be referred to a provider to establish care? No .   Dental Screening: Recommended annual dental exams for proper oral hygiene  Community Resource Referral / Chronic Care Management: CRR required this visit?  No   CCM required this visit?  No      Plan:     I have personally reviewed and noted the following in the patient's chart:   Medical and social history Use of alcohol, tobacco or illicit drugs  Current medications and supplements including opioid prescriptions. Patient is not currently taking opioid prescriptions. Functional ability and status Nutritional status Physical activity Advanced directives List of other physicians Hospitalizations, surgeries, and ER visits in previous 12 months Vitals Screenings to include cognitive, depression, and falls Referrals and appointments  In addition, I have reviewed and discussed with patient certain preventive protocols, quality metrics, and best practice recommendations. A written personalized care plan for preventive services as well as general preventive health recommendations were provided to patient.     Kellie Simmering, LPN   [id number]   Nurse Notes:

## 2020-09-11 NOTE — Patient Instructions (Signed)
Charles York , Thank you for taking time to come for your Medicare Wellness Visit. I appreciate your ongoing commitment to your health goals. Please review the following plan we discussed and let me know if I can assist you in the future.   Screening recommendations/referrals: Colonoscopy: not required Recommended yearly ophthalmology/optometry visit for glaucoma screening and checkup Recommended yearly dental visit for hygiene and checkup  Vaccinations: Influenza vaccine: due Pneumococcal vaccine: completed 11/24/2017 Tdap vaccine: due Shingles vaccine: completed   Covid-19: 11/22/2019, 03/28/2019, 03/03/2019  Advanced directives: Please bring a copy of your POA (Power of Attorney) and/or Living Will to your next appointment.   Conditions/risks identified: none  Next appointment: Follow up in one year for your annual wellness visit.   Preventive Care 37 Years and Older, Male Preventive care refers to lifestyle choices and visits with your health care provider that can promote health and wellness. What does preventive care include? A yearly physical exam. This is also called an annual well check. Dental exams once or twice a year. Routine eye exams. Ask your health care provider how often you should have your eyes checked. Personal lifestyle choices, including: Daily care of your teeth and gums. Regular physical activity. Eating a healthy diet. Avoiding tobacco and drug use. Limiting alcohol use. Practicing safe sex. Taking low doses of aspirin every day. Taking vitamin and mineral supplements as recommended by your health care provider. What happens during an annual well check? The services and screenings done by your health care provider during your annual well check will depend on your age, overall health, lifestyle risk factors, and family history of disease. Counseling  Your health care provider may ask you questions about your: Alcohol use. Tobacco use. Drug use. Emotional  well-being. Home and relationship well-being. Sexual activity. Eating habits. History of falls. Memory and ability to understand (cognition). Work and work Statistician. Screening  You may have the following tests or measurements: Height, weight, and BMI. Blood pressure. Lipid and cholesterol levels. These may be checked every 5 years, or more frequently if you are over 48 years old. Skin check. Lung cancer screening. You may have this screening every year starting at age 66 if you have a 30-pack-year history of smoking and currently smoke or have quit within the past 15 years. Fecal occult blood test (FOBT) of the stool. You may have this test every year starting at age 42. Flexible sigmoidoscopy or colonoscopy. You may have a sigmoidoscopy every 5 years or a colonoscopy every 10 years starting at age 65. Prostate cancer screening. Recommendations will vary depending on your family history and other risks. Hepatitis C blood test. Hepatitis B blood test. Sexually transmitted disease (STD) testing. Diabetes screening. This is done by checking your blood sugar (glucose) after you have not eaten for a while (fasting). You may have this done every 1-3 years. Abdominal aortic aneurysm (AAA) screening. You may need this if you are a current or former smoker. Osteoporosis. You may be screened starting at age 24 if you are at high risk. Talk with your health care provider about your test results, treatment options, and if necessary, the need for more tests. Vaccines  Your health care provider may recommend certain vaccines, such as: Influenza vaccine. This is recommended every year. Tetanus, diphtheria, and acellular pertussis (Tdap, Td) vaccine. You may need a Td booster every 10 years. Zoster vaccine. You may need this after age 25. Pneumococcal 13-valent conjugate (PCV13) vaccine. One dose is recommended after age 43. Pneumococcal polysaccharide (  PPSV23) vaccine. One dose is recommended after  age 16. Talk to your health care provider about which screenings and vaccines you need and how often you need them. This information is not intended to replace advice given to you by your health care provider. Make sure you discuss any questions you have with your health care provider. Document Released: 02/16/2015 Document Revised: 10/10/2015 Document Reviewed: 11/21/2014 Elsevier Interactive Patient Education  2017 [place] Prevention in the Home Falls can cause injuries. They can happen to people of all ages. There are many things you can do to make your home safe and to help prevent falls. What can I do on the outside of my home? Regularly fix the edges of walkways and driveways and fix any cracks. Remove anything that might make you trip as you walk through a door, such as a raised step or threshold. Trim any bushes or trees on the path to your home. Use bright outdoor lighting. Clear any walking paths of anything that might make someone trip, such as rocks or tools. Regularly check to see if handrails are loose or broken. Make sure that both sides of any steps have handrails. Any raised decks and porches should have guardrails on the edges. Have any leaves, snow, or ice cleared regularly. Use sand or salt on walking paths during winter. Clean up any spills in your garage right away. This includes oil or grease spills. What can I do in the bathroom? Use night lights. Install grab bars by the toilet and in the tub and shower. Do not use towel bars as grab bars. Use non-skid mats or decals in the tub or shower. If you need to sit down in the shower, use a plastic, non-slip stool. Keep the floor dry. Clean up any water that spills on the floor as soon as it happens. Remove soap buildup in the tub or shower regularly. Attach bath mats securely with double-sided non-slip rug tape. Do not have throw rugs and other things on the floor that can make you trip. What can I do in the  bedroom? Use night lights. Make sure that you have a light by your bed that is easy to reach. Do not use any sheets or blankets that are too big for your bed. They should not hang down onto the floor. Have a firm chair that has side arms. You can use this for support while you get dressed. Do not have throw rugs and other things on the floor that can make you trip. What can I do in the kitchen? Clean up any spills right away. Avoid walking on wet floors. Keep items that you use a lot in easy-to-reach places. If you need to reach something above you, use a strong step stool that has a grab bar. Keep electrical cords out of the way. Do not use floor polish or wax that makes floors slippery. If you must use wax, use non-skid floor wax. Do not have throw rugs and other things on the floor that can make you trip. What can I do with my stairs? Do not leave any items on the stairs. Make sure that there are handrails on both sides of the stairs and use them. Fix handrails that are broken or loose. Make sure that handrails are as long as the stairways. Check any carpeting to make sure that it is firmly attached to the stairs. Fix any carpet that is loose or worn. Avoid having throw rugs at the top or  bottom of the stairs. If you do have throw rugs, attach them to the floor with carpet tape. Make sure that you have a light switch at the top of the stairs and the bottom of the stairs. If you do not have them, ask someone to add them for you. What else can I do to help prevent falls? Wear shoes that: Do not have high heels. Have rubber bottoms. Are comfortable and fit you well. Are closed at the toe. Do not wear sandals. If you use a stepladder: Make sure that it is fully opened. Do not climb a closed stepladder. Make sure that both sides of the stepladder are locked into place. Ask someone to hold it for you, if possible. Clearly mark and make sure that you can see: Any grab bars or  handrails. First and last steps. Where the edge of each step is. Use tools that help you move around (mobility aids) if they are needed. These include: Canes. Walkers. Scooters. Crutches. Turn on the lights when you go into a dark area. Replace any light bulbs as soon as they burn out. Set up your furniture so you have a clear path. Avoid moving your furniture around. If any of your floors are uneven, fix them. If there are any pets around you, be aware of where they are. Review your medicines with your doctor. Some medicines can make you feel dizzy. This can increase your chance of falling. Ask your doctor what other things that you can do to help prevent falls. This information is not intended to replace advice given to you by your health care provider. Make sure you discuss any questions you have with your health care provider. Document Released: 11/16/2008 Document Revised: 06/28/2015 Document Reviewed: 02/24/2014 Elsevier Interactive Patient Education  2017 [employer].

## 2020-09-12 DIAGNOSIS — D1801 Hemangioma of skin and subcutaneous tissue: Secondary | ICD-10-CM | POA: Diagnosis not present

## 2020-09-12 DIAGNOSIS — L570 Actinic keratosis: Secondary | ICD-10-CM | POA: Diagnosis not present

## 2020-09-15 ENCOUNTER — Emergency Department: Payer: Medicare Other

## 2020-09-15 ENCOUNTER — Emergency Department
Admission: EM | Admit: 2020-09-15 | Discharge: 2020-09-15 | Disposition: A | Discharge status: Home / Self Care | Payer: Medicare Other | Attending: Emergency Medicine | Admitting: Emergency Medicine

## 2020-09-15 ENCOUNTER — Other Ambulatory Visit: Payer: Self-pay

## 2020-09-15 DIAGNOSIS — I129 Hypertensive chronic kidney disease with stage 1 through stage 4 chronic kidney disease, or unspecified chronic kidney disease: Secondary | ICD-10-CM | POA: Insufficient documentation

## 2020-09-15 DIAGNOSIS — Z85828 Personal history of other malignant neoplasm of skin: Secondary | ICD-10-CM | POA: Diagnosis not present

## 2020-09-15 DIAGNOSIS — R531 Weakness: Secondary | ICD-10-CM | POA: Diagnosis not present

## 2020-09-15 DIAGNOSIS — E1122 Type 2 diabetes mellitus with diabetic chronic kidney disease: Secondary | ICD-10-CM | POA: Diagnosis not present

## 2020-09-15 DIAGNOSIS — N183 Chronic kidney disease, stage 3 unspecified: Secondary | ICD-10-CM | POA: Insufficient documentation

## 2020-09-15 DIAGNOSIS — R6889 Other general symptoms and signs: Secondary | ICD-10-CM | POA: Diagnosis not present

## 2020-09-15 DIAGNOSIS — R0902 Hypoxemia: Secondary | ICD-10-CM | POA: Diagnosis not present

## 2020-09-15 DIAGNOSIS — K5900 Constipation, unspecified: Secondary | ICD-10-CM | POA: Diagnosis not present

## 2020-09-15 DIAGNOSIS — R110 Nausea: Secondary | ICD-10-CM | POA: Diagnosis not present

## 2020-09-15 DIAGNOSIS — K5730 Diverticulosis of large intestine without perforation or abscess without bleeding: Secondary | ICD-10-CM | POA: Diagnosis not present

## 2020-09-15 DIAGNOSIS — Z87891 Personal history of nicotine dependence: Secondary | ICD-10-CM | POA: Diagnosis not present

## 2020-09-15 DIAGNOSIS — R1084 Generalized abdominal pain: Secondary | ICD-10-CM | POA: Insufficient documentation

## 2020-09-15 DIAGNOSIS — R42 Dizziness and giddiness: Secondary | ICD-10-CM | POA: Diagnosis not present

## 2020-09-15 DIAGNOSIS — R404 Transient alteration of awareness: Secondary | ICD-10-CM | POA: Diagnosis not present

## 2020-09-15 DIAGNOSIS — Z20822 Contact with and (suspected) exposure to covid-19: Secondary | ICD-10-CM | POA: Insufficient documentation

## 2020-09-15 DIAGNOSIS — Z7982 Long term (current) use of aspirin: Secondary | ICD-10-CM | POA: Diagnosis not present

## 2020-09-15 DIAGNOSIS — Z79899 Other long term (current) drug therapy: Secondary | ICD-10-CM | POA: Diagnosis not present

## 2020-09-15 DIAGNOSIS — Z794 Long term (current) use of insulin: Secondary | ICD-10-CM | POA: Insufficient documentation

## 2020-09-15 DIAGNOSIS — Z743 Need for continuous supervision: Secondary | ICD-10-CM | POA: Diagnosis not present

## 2020-09-15 DIAGNOSIS — R0602 Shortness of breath: Secondary | ICD-10-CM | POA: Diagnosis not present

## 2020-09-15 LAB — URINALYSIS, COMPLETE (UACMP) WITH MICROSCOPIC
Bacteria, UA: NONE SEEN
Bilirubin Urine: NEGATIVE
Glucose, UA: NEGATIVE mg/dL
Hgb urine dipstick: NEGATIVE
Ketones, ur: 5 mg/dL — AB
Leukocytes,Ua: NEGATIVE
Nitrite: NEGATIVE
Protein, ur: NEGATIVE mg/dL
Specific Gravity, Urine: 1.01 (ref 1.005–1.030)
Squamous Epithelial / HPF: NONE SEEN (ref 0–5)
WBC, UA: NONE SEEN WBC/hpf (ref 0–5)
pH: 5 (ref 5.0–8.0)

## 2020-09-15 LAB — COMPREHENSIVE METABOLIC PANEL
ALT: 17 U/L (ref 0–44)
AST: 15 U/L (ref 15–41)
Albumin: 3.7 g/dL (ref 3.5–5.0)
Alkaline Phosphatase: 81 U/L (ref 38–126)
Anion gap: 8 (ref 5–15)
BUN: 24 mg/dL — ABNORMAL HIGH (ref 8–23)
CO2: 24 mmol/L (ref 22–32)
Calcium: 8.9 mg/dL (ref 8.9–10.3)
Chloride: 106 mmol/L (ref 98–111)
Creatinine, Ser: 1.58 mg/dL — ABNORMAL HIGH (ref 0.61–1.24)
GFR, Estimated: 42 mL/min — ABNORMAL LOW (ref >60)
Glucose, Bld: 149 mg/dL — ABNORMAL HIGH (ref 70–99)
Potassium: 4.5 mmol/L (ref 3.5–5.1)
Sodium: 138 mmol/L (ref 135–145)
Total Bilirubin: 1.2 mg/dL (ref 0.3–1.2)
Total Protein: 6.4 g/dL — ABNORMAL LOW (ref 6.5–8.1)

## 2020-09-15 LAB — CBC WITH DIFFERENTIAL/PLATELET
Abs Immature Granulocytes: 0.04 10*3/uL (ref 0.00–0.07)
Basophils Absolute: 0 10*3/uL (ref 0.0–0.1)
Basophils Relative: 1 %
Eosinophils Absolute: 0.2 10*3/uL (ref 0.0–0.5)
Eosinophils Relative: 2 %
HCT: 35.1 % — ABNORMAL LOW (ref 39.0–52.0)
Hemoglobin: 12.3 g/dL — ABNORMAL LOW (ref 13.0–17.0)
Immature Granulocytes: 1 %
Lymphocytes Relative: 42 %
Lymphs Abs: 3.6 10*3/uL (ref 0.7–4.0)
MCH: 33.6 pg (ref 26.0–34.0)
MCHC: 35 g/dL (ref 30.0–36.0)
MCV: 95.9 fL (ref 80.0–100.0)
Monocytes Absolute: 0.5 10*3/uL (ref 0.1–1.0)
Monocytes Relative: 6 %
Neutro Abs: 4.2 10*3/uL (ref 1.7–7.7)
Neutrophils Relative %: 48 %
Platelets: 200 10*3/uL (ref 150–400)
RBC: 3.66 MIL/uL — ABNORMAL LOW (ref 4.22–5.81)
RDW: 12.9 % (ref 11.5–15.5)
WBC: 8.6 10*3/uL (ref 4.0–10.5)
nRBC: 0 % (ref 0.0–0.2)

## 2020-09-15 LAB — RESP PANEL BY RT-PCR (FLU A&B, COVID) ARPGX2
Influenza A by PCR: NEGATIVE
Influenza B by PCR: NEGATIVE
SARS Coronavirus 2 by RT PCR: NEGATIVE

## 2020-09-15 LAB — TROPONIN I (HIGH SENSITIVITY)
Troponin I (High Sensitivity): 6 ng/L (ref <18)
Troponin I (High Sensitivity): 7 ng/L (ref <18)

## 2020-09-15 MED ORDER — SODIUM CHLORIDE 0.9 % IV BOLUS
500.0000 mL | Freq: Once | INTRAVENOUS | Status: AC
  Administered 2020-09-15: 500 mL via INTRAVENOUS

## 2020-09-15 MED ORDER — ACETAMINOPHEN 500 MG PO TABS
1000.0000 mg | ORAL_TABLET | Freq: Once | ORAL | Status: AC
  Administered 2020-09-15: 1000 mg via ORAL
  Filled 2020-09-15: qty 2

## 2020-09-15 MED ORDER — ONDANSETRON HCL 4 MG/2ML IJ SOLN
4.0000 mg | Freq: Once | INTRAMUSCULAR | Status: AC
  Administered 2020-09-15: 4 mg via INTRAVENOUS
  Filled 2020-09-15: qty 2

## 2020-09-15 NOTE — ED Triage Notes (Addendum)
Pt to ER via [facility] from home with complaints of generalized weakness. Reports over the last week he has gradually felt worse and had increasing shortness of breath and nausea. Reports limited oral intake but has been drinking a large amount of sodas.   Positive orthostatics with EMS. Sitting BP 140/80, standing BP 100/40 w/ diaphoresis and increased dizziness. Ems administered 400cc bag of normal saline PTA.  EMS cbg 137

## 2020-09-15 NOTE — ED Notes (Signed)
ED Provider at bedside. 

## 2020-09-15 NOTE — ED Notes (Signed)
Patient transported to X-ray 

## 2020-09-15 NOTE — ED Notes (Signed)
Gave patient water for po challenge

## 2020-09-15 NOTE — ED Notes (Signed)
Patient transported to CT 

## 2020-09-15 NOTE — ED Provider Notes (Signed)
[facility] Emergency Department Provider Note ____________________________________________   Event Date/Time   First MD Initiated Contact with Patient 09/15/20 [phone]     (approximate)  I have reviewed the triage vital signs and the nursing notes.  HISTORY  Chief Complaint Weakness   HPI Charles York is a 87 y.o. malewho presents to the ED for evaluation of weakness.   Chart review indicates history of anxiety and depression, recently seeing his PCP for this.  Started on Wellbutrin. Otherwise history of HTN, HLD, DM and CKD.  Patient presents to the ED via EMS from home for evaluation of generalized weakness and nausea.  EMS reports orthostasis with initial supine BP at home of [id number] systolic, dropping to the low 100s with standing for transfer.  Patient presents to the ED due to an episode of nausea and generalized feeling unwell this morning.  He reports that he has been feeling nauseous in the mornings for the past 4-5 months, and he had a similar syndrome this morning while he was up and drinking his morning coffee.  He reports feeling so bad in generalized fashion that he had his daughter called 911.  Patient further reports intermittent lower abdominal pain "for a while" and he has difficulty describing this.  He denies any worsening symptoms with urination, denies dysuria or hematuria.  He reports intermittent constipation and pale-colored stools.  Denies diarrhea, melena or hematochezia.  Denies emesis.  Denies fevers, chest pain, shortness of breath or cough.  Denies syncopal episodes, but occasionally getting dizzy.  Past Medical History:  Diagnosis Date   Anxiety    Arthritis    Cancer ([place])    skin   Chronic kidney disease    stage 3   Colon polyp    Diabetes mellitus without complication ([place])    GERD (gastroesophageal reflux disease)    Heartburn    Hyperlipemia    Hypertension    Obesity     Patient Active Problem List   Diagnosis  Date Noted   Major depressive disorder, recurrent, moderate ([place]) 10/12/2019   GAD (generalized anxiety disorder) 10/12/2019   Chronic right shoulder pain 08/17/2018   Absolute anemia 07/30/2018   Primary osteoarthritis involving multiple joints 06/15/2018   GERD (gastroesophageal reflux disease) 11/18/2016   Diabetic retinopathy ([place]) 11/18/2016   Hyperlipidemia associated with type 2 diabetes mellitus ([place]) 11/18/2016   Daytime sleepiness 07/15/2016   Fatigue 07/15/2016   Presbycusis of both ears 07/15/2016   Tinnitus of right ear 07/15/2016   Allergic rhinitis due to allergen 06/09/2016   CKD (chronic kidney disease), stage III ([place]) 02/27/2016   BPPV (benign paroxysmal positional vertigo), left 02/27/2016   Chronic pansinusitis 02/27/2016   Arthritis 07/03/2015   Colon polyp 07/03/2015   Essential hypertension 07/03/2015   High cholesterol 07/03/2015   Type 2 diabetes, controlled, with neuropathy ([place]) 07/03/2015    Past Surgical History:  Procedure Laterality Date   CHOLECYSTECTOMY N/A 02/04/2018   Procedure: LAPAROSCOPIC CHOLECYSTECTOMY;  Surgeon: Johnathan Hausen, MD;  Location: [facility] ORS;  Service: General;  Laterality: N/A;   EYE SURGERY  11/2017   fluid in back of eye/ injection every 3 months. done at [place] Bilateral    cataracts extracted    Prior to Admission medications   Medication Sig Start Date End Date Taking? Authorizing Provider  aspirin 325 MG EC tablet TK 1 T PO QD FOR UP TO 90 DAYS THEN CAN REDUCE TO 81 MG DAILY 01/07/19  [provider]  atorvastatin (LIPITOR) 40 MG tablet Take 1 tablet (40 mg total) by mouth at bedtime. 03/21/19   Karamalegos, Devonne Doughty, DO  buPROPion (WELLBUTRIN XL) 150 MG 24 hr tablet Take 1 tablet (150 mg total) by mouth daily. 09/03/20   Karamalegos, Devonne Doughty, DO  escitalopram (LEXAPRO) 10 MG tablet Take 1 tablet (10 mg total) by mouth daily. 09/03/20   Karamalegos, Devonne Doughty, DO  ferrous sulfate 325 (65 FE) MG  tablet Take 325 mg by mouth every other day.    [provider]  gabapentin (NEURONTIN) 100 MG capsule Take 1 cap in morning, 1 cap in afternoon, and 3 caps at night. Patient not taking: Reported on 09/11/2020 02/22/20   [name], DO  HUMALOG 100 UNIT/ML injection ADMINISTER 12 UNITS UNDER THE SKIN TWICE DAILY WITH A MEAL 08/11/19   Karamalegos, Devonne Doughty, DO  HUMALOG KWIKPEN 100 UNIT/ML KwikPen Inject 0.12 mLs (12 Units total) into the skin 2 (two) times daily with a meal. Up to 50 units/day as directed by MD 03/23/19   [name], DO  hydroxypropyl methylcellulose / hypromellose (ISOPTO TEARS / GONIOVISC) 2.5 % ophthalmic solution Place 1 drop into both eyes 4 (four) times daily.    [provider]  insulin aspart protamine - aspart (NOVOLOG 70/30 MIX) (70-30) 100 UNIT/ML FlexPen Inject 12 Units into the skin 2 (two) times daily. In the morning and evening    [provider]  ipratropium (ATROVENT) 0.06 % nasal spray USE 2 SPRAYS IN EACH NOSTRIL FOUR TIMES DAILY FOR UP TO 5 TO 7 DAYS THEN STOP Patient not taking: Reported on 09/11/2020 09/07/18   [name], DO  LANTUS 100 UNIT/ML injection INJECT 27 UNITS SUBCUTANEOUSLY BEFORE BREAKFAST 07/19/19   Karamalegos, Devonne Doughty, DO  losartan (COZAAR) 25 MG tablet TAKE 1 TABLET(25 MG) BY MOUTH DAILY 12/10/19   Parks Ranger, Devonne Doughty, DO  omeprazole (PRILOSEC) 20 MG capsule TAKE 1 CAPSULE BY MOUTH TWICE A DAY BEFORE A MEAL 05/31/20   Karamalegos, Devonne Doughty, DO  polyethylene glycol powder (GLYCOLAX/MIRALAX) 17 GM/SCOOP powder Take 17-34 g by mouth 2 (two) times daily as needed for mild constipation or moderate constipation. Patient not taking: Reported on 09/11/2020 04/08/19   [name], DO  sucralfate (CARAFATE) 1 g tablet Take 1 tablet (1 g total) by mouth 4 (four) times daily. 06/24/19   Nance Pear, MD  vitamin B-12 (CYANOCOBALAMIN) 1000 MCG tablet Take 2,000 mcg by mouth  daily. Patient not taking: Reported on 09/11/2020    [provider]    Allergies Trulicity [dulaglutide], Buspirone, and Penicillins  Family History  Problem Relation Age of Onset   Cancer Father        carcinoma   Alzheimer's disease Mother     Social History Social History   Tobacco Use   Smoking status: Former    Types: Cigarettes    Quit date: 02/04/1983    Years since quitting: 37.6   Smokeless tobacco: Former  Scientific laboratory technician Use: Never used  Substance Use Topics   Alcohol use: No    Alcohol/week: 0.0 standard drinks   Drug use: No    Review of Systems  Constitutional: No fever/chills.  Positive generalized weakness. Eyes: No visual changes. ENT: No sore throat. Cardiovascular: Denies chest pain. Respiratory: Denies shortness of breath. Gastrointestinal: Positive for intermittent nausea and lower abdominal pain. no vomiting.  No diarrhea.  No constipation. Genitourinary: Negative for dysuria. Musculoskeletal: Negative for  back pain. Skin: Negative for rash. Neurological: Negative for headaches, focal weakness or numbness.  ____________________________________________   PHYSICAL EXAM:  VITAL SIGNS: Vitals:   09/15/20 1000 09/15/20 1100  BP: 137/70 135/70  Pulse: 65 70  Resp: 16 17  Temp:    SpO2: 95% 97%     Constitutional: Alert and oriented. Well appearing and in no acute distress. Eyes: Conjunctivae are normal. PERRL. EOMI. Head: Atraumatic. Nose: No congestion/rhinnorhea. Mouth/Throat: Mucous membranes are moist.  Oropharynx non-erythematous. Neck: No stridor. No cervical spine tenderness to palpation. Cardiovascular: Normal rate, regular rhythm. Grossly normal heart sounds.  Good peripheral circulation. Respiratory: Normal respiratory effort.  No retractions. Lungs CTAB. Gastrointestinal: Soft , nondistended, . No CVA tenderness. Mild lower abdominal tenderness diffusely without localizing or peritoneal features.  Abdomen is  otherwise benign. Musculoskeletal: No lower extremity tenderness nor edema.  No joint effusions. No signs of acute trauma. Neurologic:  Normal speech and language. No gross focal neurologic deficits are appreciated.  Skin:  Skin is warm, dry and intact. No rash noted. Psychiatric: Mood and affect are flat. Speech and behavior are normal.  ____________________________________________   LABS (all labs ordered are listed, but only abnormal results are displayed)  Labs Reviewed  COMPREHENSIVE METABOLIC PANEL - Abnormal; Notable for the following components:      Result Value   Glucose, Bld 149 (*)    BUN 24 (*)    Creatinine, Ser 1.58 (*)    Total Protein 6.4 (*)    GFR, Estimated 42 (*)    All other components within normal limits  CBC WITH DIFFERENTIAL/PLATELET - Abnormal; Notable for the following components:   RBC 3.66 (*)    Hemoglobin 12.3 (*)    HCT 35.1 (*)    All other components within normal limits  URINALYSIS, COMPLETE (UACMP) WITH MICROSCOPIC - Abnormal; Notable for the following components:   Color, Urine STRAW (*)    APPearance CLEAR (*)    Ketones, ur 5 (*)    All other components within normal limits  RESP PANEL BY RT-PCR (FLU A&B, COVID) ARPGX2  TROPONIN I (HIGH SENSITIVITY)  TROPONIN I (HIGH SENSITIVITY)   ____________________________________________  12 Lead EKG   ____________________________________________  RADIOLOGY  ED MD interpretation:    Official radiology report(s): CT ABDOMEN PELVIS WO CONTRAST  Result Date: 09/15/2020 CLINICAL DATA:  Lower abdominal pain, constipation, weakness, shortness of breath, nausea EXAM: CT ABDOMEN AND PELVIS WITHOUT CONTRAST TECHNIQUE: Multidetector CT imaging of the abdomen and pelvis was performed following the standard protocol without IV contrast. COMPARISON:  03/03/2018 FINDINGS: Lower chest: No pleural or pericardial effusion. Linear scarring or atelectasis at the left lung base stable. Hepatobiliary:  Cholecystectomy clips. No focal liver lesion or biliary ductal dilatation. Pancreas: Unremarkable. No pancreatic ductal dilatation or surrounding inflammatory changes. Spleen: Normal in size without focal abnormality. Adrenals/Urinary Tract: Adrenal glands are unremarkable. Kidneys are normal, without renal calculi, focal lesion, or hydronephrosis. Bladder is unremarkable. Stomach/Bowel: Stomach is nondistended. Small bowel decompressed. Appendix not discretely identified. No pericecal inflammatory/edematous change. The colon is non distended. Multiple descending and sigmoid diverticula with minimal regional inflammatory change. No abscess. Vascular/Lymphatic: Aortoiliac calcifications without aneurysm. No abdominal or pelvic adenopathy. Reproductive: Prostate is unremarkable. Other: No ascites.  No free air. Musculoskeletal: Small paraumbilical hernia containing only mesenteric fat. Stable 1.1 cm right lower quadrant retroperitoneal calcification likely benign. Previous posterior lumbar decompression. Multilevel lumbar spondylitic change. Negative for fracture or worrisome bone lesion. IMPRESSION: 1. No acute findings. 2. Descending and sigmoid diverticulosis  3.  Aortic Atherosclerosis (ICD10-170.0). Electronically Signed   By: Lucrezia Europe M.D.   On: 09/15/2020 09:39   DG Chest 2 View  Result Date: 09/15/2020 CLINICAL DATA:  87 year old male with shortness of breath and increasing weakness for 1 day. Former smoker. EXAM: CHEST - 2 VIEW COMPARISON:  Chest radiograph 10/05/2019 and earlier. FINDINGS: Mildly lower lung volumes. Stable elevation of the left hemidiaphragm. Normal cardiac size and mediastinal contours. Visualized tracheal air column is within normal limits. No pneumothorax, pulmonary edema, pleural effusion or confluent pulmonary opacity. No acute osseous abnormality identified. Stable cholecystectomy clips. Negative visible bowel gas pattern. Calcified aortic atherosclerosis. IMPRESSION: No acute  cardiopulmonary abnormality. Aortic Atherosclerosis (ICD10-I70.0). Electronically Signed   By: Genevie Ann M.D.   On: 09/15/2020 08:31    ____________________________________________   PROCEDURES and INTERVENTIONS  Procedure(s) performed (including Critical Care):  .1-3 Lead EKG Interpretation  Date/Time: 09/15/2020 12:07 PM Performed by: Vladimir Crofts, MD Authorized by: Vladimir Crofts, MD     Interpretation: normal     ECG rate:  70   ECG rate assessment: normal     Rhythm: sinus rhythm     Ectopy: none     Conduction: normal    Medications  sodium chloride 0.9 % bolus 500 mL (0 mLs Intravenous Stopped 09/15/20 1003)  ondansetron (ZOFRAN) injection 4 mg (4 mg Intravenous Given 09/15/20 0836)  acetaminophen (TYLENOL) tablet 1,000 mg (1,000 mg Oral Given 09/15/20 0834)    ____________________________________________   MDM / ED COURSE   87 year old male with depression and anxiety presents to the ED after an episode of generalized weakness and dizziness, possibly related to panic attacks, and without evidence of significant acute medical pathology and ultimately amenable to outpatient management.  He looks clinically well but with a flat affect, but with linear thought processes, and no evidence of psychiatric emergency.  No suicidality.  No signs of trauma, neurologic or vascular deficits.  His work-up is benign without evidence of cardiac dysrhythmia, ACS.  No recurrence of symptoms while in the ED. I see no barriers to outpatient management at this time.  He is suitable for outpatient management.  Discussed return precautions with patient and his daughter.  Clinical Course as of 09/15/20 1207  Sat Sep 15, 2020  1106 Reassessed.  Patient reports feeling a little bit better.  We discussed his largely benign work-up.  We discussed the possibility of depression contributing to his symptoms.  He acknowledges this.  Denies any suicidality or psychiatric emergency.  We discussed me calling his  daughter to talk with her as well as a p.o. and ambulation trial for him.  He is agreeable to the possibility of outpatient management. [DS]  Laurie daughter, Dalene Seltzer.  Pt called her to the other room. Said he didn't feel right, dizzy, sweaty. No syncope. His sugar was fine. He has been having some panic attacks, which is why they started the wellbutrin and lexapro. Poor appetite.  We discussed benign workup. She wants him back home. She can come pick him up. [DS]    Clinical Course User Index [DS] Vladimir Crofts, MD    ____________________________________________   FINAL CLINICAL IMPRESSION(S) / ED DIAGNOSES  Final diagnoses:  Generalized weakness  Dizzinesses     ED Discharge Orders     None        Jasma Seevers Tamala Julian   Note:  This document was prepared using Dragon voice recognition software and may include unintentional dictation errors.    Vladimir Crofts, MD 09/15/20  1209  

## 2020-09-15 NOTE — Discharge Instructions (Addendum)
Continue all of your medications, including the Wellbutrin and Lexapro that you have been recently prescribed.  Please follow-up with your PCP to discuss your continued symptoms.  If you develop any worsening symptoms, passing out all the way, or other acute concerns, please return to the ED.

## 2020-09-25 ENCOUNTER — Other Ambulatory Visit: Payer: Self-pay | Admitting: Family Medicine

## 2020-09-25 DIAGNOSIS — F411 Generalized anxiety disorder: Secondary | ICD-10-CM

## 2020-09-25 DIAGNOSIS — F331 Major depressive disorder, recurrent, moderate: Secondary | ICD-10-CM

## 2020-09-25 NOTE — Telephone Encounter (Signed)
Requested medication (s) are due for refill today:   No  Requested medication (s) are on the active medication list:   Yes  Future visit scheduled:   Yes   Last ordered: 09/03/2020 #30, 2 refills  Returned because pharmacy requesting a 90 day supply and a DX Code.   Requested Prescriptions  Pending Prescriptions Disp Refills   buPROPion (WELLBUTRIN XL) 150 MG 24 hr tablet [Pharmacy Med Name: BUPROPION HCL XL 150 MG TABLET] 90 tablet 1    Sig: TAKE 1 TABLET BY MOUTH EVERY DAY     Psychiatry: Antidepressants - bupropion Passed - 09/25/2020 12:35 PM      Passed - Completed PHQ-2 or PHQ-9 in the last 360 days      Passed - Last BP in normal range    BP Readings from Last 1 Encounters:  09/15/20 135/70          Passed - Valid encounter within last 6 months    Recent Outpatient Visits           3 weeks ago Major depressive disorder, recurrent, moderate ([place])   [place], DO   7 months ago Entrapment of right ulnar nerve   [place], DO   8 months ago Major depressive disorder, recurrent, moderate ([place])   [facility] [name], DO   10 months ago Essential hypertension   [facility] [place], Devonne Doughty, DO   11 months ago Major depressive disorder, recurrent, moderate ([place])   [facility] [place], Devonne Doughty, DO       Future Appointments             In 3 weeks [place], Devonne Doughty, DO [facility], [place]   In 11 months  [facility], [place]             escitalopram ([place]) 10 MG tablet [Pharmacy Med Name: ESCITALOPRAM 10 MG TABLET] 90 tablet 1    Sig: TAKE 1 TABLET BY [place]     Psychiatry:  Antidepressants - SSRI Passed - 09/25/2020 12:35 PM      Passed - Completed PHQ-2 or PHQ-9 in the last 360 days      Passed - Valid encounter within last 6 months    Recent Outpatient  Visits           3 weeks ago Major depressive disorder, recurrent, moderate ([place])   [place], DO   7 months ago Entrapment of right ulnar nerve   [place], DO   8 months ago Major depressive disorder, recurrent, moderate ([place])   [place], DO   10 months ago Essential hypertension   [place], DO   11 months ago Major depressive disorder, recurrent, moderate ([place])   [facility] [place] Ranger, Devonne Doughty, DO       Future Appointments             In 3 weeks [place], Devonne Doughty, DO [facility], [place]   In 11 months  [facility], [place] [facility]

## 2020-09-27 ENCOUNTER — Telehealth: Payer: Medicare Other

## 2020-10-11 ENCOUNTER — Other Ambulatory Visit: Payer: Self-pay

## 2020-10-11 DIAGNOSIS — Z0000 Encounter for general adult medical examination without abnormal findings: Secondary | ICD-10-CM

## 2020-10-11 DIAGNOSIS — E1140 Type 2 diabetes mellitus with diabetic neuropathy, unspecified: Secondary | ICD-10-CM

## 2020-10-11 DIAGNOSIS — I10 Essential (primary) hypertension: Secondary | ICD-10-CM

## 2020-10-11 DIAGNOSIS — E1169 Type 2 diabetes mellitus with other specified complication: Secondary | ICD-10-CM

## 2020-10-11 DIAGNOSIS — E785 Hyperlipidemia, unspecified: Secondary | ICD-10-CM

## 2020-10-11 DIAGNOSIS — N1831 Chronic kidney disease, stage 3a: Secondary | ICD-10-CM

## 2020-10-11 DIAGNOSIS — R351 Nocturia: Secondary | ICD-10-CM

## 2020-10-12 ENCOUNTER — Other Ambulatory Visit: Payer: Self-pay

## 2020-10-12 ENCOUNTER — Other Ambulatory Visit: Payer: Medicare Other

## 2020-10-12 DIAGNOSIS — E1169 Type 2 diabetes mellitus with other specified complication: Secondary | ICD-10-CM | POA: Diagnosis not present

## 2020-10-12 DIAGNOSIS — E1140 Type 2 diabetes mellitus with diabetic neuropathy, unspecified: Secondary | ICD-10-CM | POA: Diagnosis not present

## 2020-10-12 DIAGNOSIS — E785 Hyperlipidemia, unspecified: Secondary | ICD-10-CM | POA: Diagnosis not present

## 2020-10-12 DIAGNOSIS — I10 Essential (primary) hypertension: Secondary | ICD-10-CM | POA: Diagnosis not present

## 2020-10-13 LAB — CBC WITH DIFFERENTIAL/PLATELET
Absolute Monocytes: 589 cells/uL (ref 200–950)
Basophils Absolute: 48 cells/uL (ref 0–200)
Basophils Relative: 0.5 %
Eosinophils Absolute: 181 cells/uL (ref 15–500)
Eosinophils Relative: 1.9 %
HCT: 38.1 % — ABNORMAL LOW (ref 38.5–50.0)
Hemoglobin: 12.4 g/dL — ABNORMAL LOW (ref 13.2–17.1)
Lymphs Abs: 4237 cells/uL — ABNORMAL HIGH (ref 850–3900)
MCH: 32.5 pg (ref 27.0–33.0)
MCHC: 32.5 g/dL (ref 32.0–36.0)
MCV: 100 fL (ref 80.0–100.0)
MPV: 11.2 fL (ref 7.5–12.5)
Monocytes Relative: 6.2 %
Neutro Abs: 4446 cells/uL (ref 1500–7800)
Neutrophils Relative %: 46.8 %
Platelets: 242 10*3/uL (ref 140–400)
RBC: 3.81 10*6/uL — ABNORMAL LOW (ref 4.20–5.80)
RDW: 12.4 % (ref 11.0–15.0)
Total Lymphocyte: 44.6 %
WBC: 9.5 10*3/uL (ref 3.8–10.8)

## 2020-10-13 LAB — COMPLETE METABOLIC PANEL WITH GFR
AG Ratio: 1.7 (calc) (ref 1.0–2.5)
ALT: 16 U/L (ref 9–46)
AST: 12 U/L (ref 10–35)
Albumin: 4 g/dL (ref 3.6–5.1)
Alkaline phosphatase (APISO): 87 U/L (ref 35–144)
BUN/Creatinine Ratio: 19 (calc) (ref 6–22)
BUN: 33 mg/dL — ABNORMAL HIGH (ref 7–25)
CO2: 23 mmol/L (ref 20–32)
Calcium: 9.2 mg/dL (ref 8.6–10.3)
Chloride: 104 mmol/L (ref 98–110)
Creat: 1.71 mg/dL — ABNORMAL HIGH (ref 0.70–1.22)
Globulin: 2.3 g/dL (calc) (ref 1.9–3.7)
Glucose, Bld: 253 mg/dL — ABNORMAL HIGH (ref 65–139)
Potassium: 5.4 mmol/L — ABNORMAL HIGH (ref 3.5–5.3)
Sodium: 135 mmol/L (ref 135–146)
Total Bilirubin: 0.4 mg/dL (ref 0.2–1.2)
Total Protein: 6.3 g/dL (ref 6.1–8.1)
eGFR: 38 mL/min/{1.73_m2} — ABNORMAL LOW (ref >=60)

## 2020-10-13 LAB — LIPID PANEL
Cholesterol: 127 mg/dL (ref <200)
HDL: 46 mg/dL (ref >=40)
LDL Cholesterol (Calc): 59 mg/dL (calc)
Non-HDL Cholesterol (Calc): 81 mg/dL (calc) (ref <130)
Total CHOL/HDL Ratio: 2.8 (calc) (ref <5.0)
Triglycerides: 134 mg/dL (ref <150)

## 2020-10-13 LAB — HEMOGLOBIN A1C
Hgb A1c MFr Bld: 7.2 % of total Hgb — ABNORMAL HIGH (ref <5.7)
Mean Plasma Glucose: 160 mg/dL
eAG (mmol/L): 8.9 mmol/L

## 2020-10-13 LAB — PSA: PSA: 0.85 ng/mL (ref <=4.00)

## 2020-10-13 LAB — TSH: TSH: 3.58 mIU/L (ref 0.40–4.50)

## 2020-10-18 ENCOUNTER — Encounter: Payer: Self-pay | Admitting: Family Medicine

## 2020-10-18 ENCOUNTER — Ambulatory Visit (INDEPENDENT_AMBULATORY_CARE_PROVIDER_SITE_OTHER): Payer: Medicare Other | Admitting: Family Medicine

## 2020-10-18 ENCOUNTER — Other Ambulatory Visit: Payer: Self-pay

## 2020-10-18 VITALS — BP 120/54 | HR 71 | Ht 69.0 in | Wt 183.2 lb

## 2020-10-18 DIAGNOSIS — Z23 Encounter for immunization: Secondary | ICD-10-CM | POA: Diagnosis not present

## 2020-10-18 DIAGNOSIS — E1140 Type 2 diabetes mellitus with diabetic neuropathy, unspecified: Secondary | ICD-10-CM

## 2020-10-18 DIAGNOSIS — I129 Hypertensive chronic kidney disease with stage 1 through stage 4 chronic kidney disease, or unspecified chronic kidney disease: Secondary | ICD-10-CM | POA: Diagnosis not present

## 2020-10-18 DIAGNOSIS — N183 Chronic kidney disease, stage 3 unspecified: Secondary | ICD-10-CM | POA: Diagnosis not present

## 2020-10-18 DIAGNOSIS — Z0000 Encounter for general adult medical examination without abnormal findings: Secondary | ICD-10-CM | POA: Diagnosis not present

## 2020-10-18 DIAGNOSIS — N1831 Chronic kidney disease, stage 3a: Secondary | ICD-10-CM | POA: Diagnosis not present

## 2020-10-18 DIAGNOSIS — F3342 Major depressive disorder, recurrent, in full remission: Secondary | ICD-10-CM

## 2020-10-18 LAB — POCT UA - MICROALBUMIN: Microalbumin Ur, POC: 20 mg/L

## 2020-10-18 NOTE — Assessment & Plan Note (Signed)
Improved major depression, moderate recurrent now in remission See improved PHQ GAD scores Off therapy, no longer on wellbutrin or SSRI

## 2020-10-18 NOTE — Progress Notes (Addendum)
Subjective:    Patient ID: Charles York, male    DOB: 1930/04/17, 87 y.o.   MRN: [id number]  Charles York is a 87 y.o. male presenting on 10/18/2020 for Annual Exam   HPI  Here for Annual Physical and lab Review  CHRONIC HTN CKD-III He was seen by [facility] and they lowered his BP down from 69m to 228mdue to dizziness. He has continued to have dizziness, therefore he stopped med completely came off Losartan 2532mnd now he has 0 or no dizziness and his BP has been controlled. Not on medication Reports good compliance, took meds today. Tolerating well, w/o complaints. Denies CP, dyspnea, HA, edema, dizziness / lightheadedness  CHRONIC DM, Type 2: Reports no concerns A1c improved to 7.2 CBGs: Avg 150, has had high 180s Meds: Lantus 27 units daily and Novolog mix 12 u BID Reports  good compliance. Tolerating well w/o side-effects No longer on ARB Lifestyle: - Diet (improved)  Denies hypoglycemia, polyuria, visual changes, numbness or tingling.  HYPERLIPIDEMIA: - Reports no concerns. Last lipid panel 10/2020, controlled  - Currently taking Atorvastatin, tolerating well without side effects or myalgias  Major Depression, recurrent in remission GAD Anxiety Interval updates - prior history on sertraline (too strong or side effect), lexapro was successful but then came off med not needed, buspar caused dizziness Was on Wellbutrin and Lexapro Came off BP med and felt much better, now his spirits are improved mood is good, he denies any depression. Not taking Wellbutrin or Lexapro   Health Maintenance:  High dose flu shot today.  [facility] 10/18/2020 09/11/2020 09/03/2020  Decreased Interest 0 0 3  Down, Depressed, Hopeless 0 0 3  PHQ - 2 Score 0 0 6  Altered sleeping 0 - 3  Tired, decreased energy 0 - 3  Change in appetite 0 - 3  Feeling bad or failure about yourself  0 - 0  Trouble concentrating 0 - 0  Moving slowly or fidgety/restless 0 - 0  Suicidal thoughts 0  - 0  PHQ-9 Score 0 - 15  Difficult doing work/chores Not difficult at all - Very difficult  Some recent data might be hidden   GAD 7 : Generalized Anxiety Score 09/03/2020 01/16/2020 11/01/2019 10/12/2019  Nervous, Anxious, on Edge 3 1 1 3   Control/stop worrying 3 1 1 2   Worry too much - different things 3 1 1 2   Trouble relaxing 3 0 0 1  Restless 1 0 0 1  Easily annoyed or irritable 3 1 0 3  Afraid - awful might happen 3 1 0 2  Total GAD 7 Score 19 5 3 14   Anxiety Difficulty Somewhat difficult Not difficult at all Not difficult at all Somewhat difficult      Past Medical History:  Diagnosis Date   Anxiety    Arthritis    [place]   Chronic kidney disease    stage 3   Colon polyp    Diabetes mellitus without complication ([facility]  GERD (gastroesophageal reflux disease)    Heartburn    Hyperlipemia    Hypertension    Obesity    Past Surgical History:  Procedure Laterality Date   CHOLECYSTECTOMY N/A 02/04/2018   Procedure: LAPAROSCOPIC CHOLECYSTECTOMY;  Surgeon: MarJohnathan HausenD;  Location: [facility] ORS;  Service: General;  Laterality: N/A;   EYE SURGERY  11/2017   fluid in back of eye/ injection every 3 months. 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    Comment: retired  Tobacco Use   Smoking status: Former    Types: Cigarettes    Quit date: 02/04/1983    Years since quitting: 37.7   Smokeless tobacco: Former  Scientific laboratory technician Use: Never used  Substance and Sexual Activity   Alcohol use: No    Alcohol/week: 0.0 standard drinks   Drug use: No   Sexual activity: Not on file  Other Topics Concern   Not on file  Social History Narrative   Not on file   Social  Determinants of Health   Financial Resource Strain: Low Risk    Difficulty of Paying Living Expenses: Not hard at all  Food Insecurity: No Food Insecurity   Worried About Charity fundraiser in the Last Year: Never true   Arboriculturist in the Last Year: Never true  Transportation Needs: No Transportation Needs   Lack of Transportation (Medical): No   Lack of Transportation (Non-Medical): No  Physical Activity: Insufficiently Active   Days of Exercise per Week: 4 days   Minutes of Exercise per Session: 30 min  Stress: No Stress Concern Present   Feeling of Stress : Not at all  Social Connections: Socially Isolated   Frequency of Communication with Friends and Family: More than three times a week   Frequency of Social Gatherings with Friends and Family: More than three times a week   Attends Religious Services: Never   Marine scientist or Organizations: No   Attends Archivist Meetings: Never   Marital Status: Widowed  Human resources officer Violence: Not At Risk   Fear of Current or Ex-Partner: No   Emotionally Abused: No   Physically Abused: No   Sexually Abused: No   Family History  Problem Relation Age of Onset   Cancer Father        carcinoma   Alzheimer's disease Mother    Current Outpatient Medications on File Prior to Visit  Medication Sig   aspirin 325 MG EC tablet TK 1 T PO QD FOR UP TO 90 DAYS THEN CAN REDUCE TO 81 MG DAILY   atorvastatin (LIPITOR) 40 MG tablet Take 1 tablet (40 mg total) by mouth at bedtime.   ferrous sulfate 325 (65 FE) MG tablet Take 325 mg by mouth every other day.   hydroxypropyl methylcellulose / hypromellose (ISOPTO TEARS / GONIOVISC) 2.5 % ophthalmic solution Place 1 drop into both eyes 4 (four) times daily.   insulin aspart protamine - aspart (NOVOLOG 70/30 MIX) (70-30) 100 UNIT/ML FlexPen Inject 12 Units into the skin 2 (two) times daily. In the morning and evening   ipratropium (ATROVENT) 0.06 % nasal spray USE 2 SPRAYS IN EACH  NOSTRIL FOUR TIMES DAILY FOR UP TO 5 TO 7 DAYS THEN STOP (Patient not taking: Reported on 09/11/2020)   LANTUS 100 UNIT/ML injection INJECT 27 UNITS SUBCUTANEOUSLY BEFORE BREAKFAST   omeprazole (PRILOSEC) 20 MG capsule TAKE 1 CAPSULE BY MOUTH TWICE A DAY BEFORE A MEAL   polyethylene glycol powder (GLYCOLAX/MIRALAX) 17 GM/SCOOP powder Take 17-34 g by mouth 2 (two) times daily as  needed for mild constipation or moderate constipation. (Patient not taking: Reported on 09/11/2020)   sucralfate (CARAFATE) 1 g tablet Take 1 tablet (1 g total) by mouth 4 (four) times daily.   No current facility-administered medications on file prior to visit.    Review of Systems  Constitutional:  Negative for activity change, appetite change, chills, diaphoresis, fatigue and fever.  HENT:  Negative for congestion and hearing loss.   Eyes:  Negative for visual disturbance.  Respiratory:  Negative for cough, chest tightness, shortness of breath and wheezing.   Cardiovascular:  Negative for chest pain, palpitations and leg swelling.  Gastrointestinal:  Negative for abdominal pain, constipation, diarrhea, nausea and vomiting.  Genitourinary:  Negative for dysuria, frequency and hematuria.  Musculoskeletal:  Negative for arthralgias and neck pain.  Skin:  Negative for rash.  Neurological:  Negative for dizziness, weakness, light-headedness, numbness and headaches.  Hematological:  Negative for adenopathy.  Psychiatric/Behavioral:  Negative for behavioral problems, dysphoric mood and sleep disturbance.   Per HPI unless specifically indicated above     Objective:    BP (!) 120/54   Pulse 71   Ht 5' 9"  (1.753 m)   Wt 183 lb 3.2 oz (83.1 kg)   SpO2 100%   BMI 27.05 kg/m   Wt Readings from Last 3 Encounters:  10/18/20 183 lb 3.2 oz (83.1 kg)  09/15/20 185 lb 6.5 oz (84.1 kg)  09/11/20 182 lb (82.6 kg)    Physical Exam Vitals and nursing note reviewed.  Constitutional:      General: He is not in acute  distress.    Appearance: He is well-developed. He is not diaphoretic.     Comments: Well-appearing, comfortable, cooperative  HENT:     Head: Normocephalic and atraumatic.  Eyes:     General:        Right eye: No discharge.        Left eye: No discharge.     Conjunctiva/sclera: Conjunctivae normal.     Pupils: Pupils are equal, round, and reactive to light.  Neck:     Thyroid: No thyromegaly.     Vascular: No carotid bruit.  Cardiovascular:     Rate and Rhythm: Normal rate and regular rhythm.     Pulses: Normal pulses.     Heart sounds: Normal heart sounds. No murmur heard. Pulmonary:     Effort: Pulmonary effort is normal. No respiratory distress.     Breath sounds: Normal breath sounds. No wheezing or rales.  Abdominal:     General: Bowel sounds are normal. There is no distension.     Palpations: Abdomen is soft. There is no mass.     Tenderness: There is no abdominal tenderness.  Musculoskeletal:        General: No tenderness. Normal range of motion.     Cervical back: Normal range of motion and neck supple.     Right lower leg: No edema.     Left lower leg: No edema.     Comments: Upper / Lower Extremities: - Normal muscle tone, strength bilateral upper extremities 5/5, lower extremities 5/5  Lymphadenopathy:     Cervical: No cervical adenopathy.  Skin:    General: Skin is warm and dry.     Findings: No erythema or rash.  Neurological:     Mental Status: He is alert and oriented to person, place, and time.     Comments: Distal sensation intact to light touch all extremities  Psychiatric:  Mood and Affect: Mood normal.        Behavior: Behavior normal.        Thought Content: Thought content normal.     Comments: Well groomed, good eye contact, normal speech and thoughts   Diabetic Foot Exam - Simple   Simple Foot Form Diabetic Foot exam was performed with the following findings: Yes 10/18/2020  8:58 AM  Visual Inspection See comments: Yes Sensation  Testing See comments: Yes Pulse Check Posterior Tibialis and Dorsalis pulse intact bilaterally: Yes Comments Mild reduced monofilament sensation bilateral plantar feet but still intact. Dorsal intact. Some slight toe angle deformity and thicker toenails. Dry skin. Early formation callus. No ulceration.      Results for orders placed or performed in visit on 10/11/20  TSH  Result Value Ref Range   TSH 3.58 0.40 - 4.50 mIU/L  PSA  Result Value Ref Range   PSA 0.85 < OR = 4.00 ng/mL  Hemoglobin A1c  Result Value Ref Range   Hgb A1c MFr Bld 7.2 (H) <5.7 % of total Hgb   Mean Plasma Glucose 160 mg/dL   eAG (mmol/L) 8.9 mmol/L  CBC with Differential/Platelet  Result Value Ref Range   WBC 9.5 3.8 - 10.8 Thousand/uL   RBC 3.81 (L) 4.20 - 5.80 Million/uL   Hemoglobin 12.4 (L) 13.2 - 17.1 g/dL   HCT 38.1 (L) 38.5 - 50.0 %   MCV 100.0 80.0 - 100.0 fL   MCH 32.5 27.0 - 33.0 pg   MCHC 32.5 32.0 - 36.0 g/dL   RDW 12.4 11.0 - 15.0 %   Platelets 242 140 - 400 Thousand/uL   MPV 11.2 7.5 - 12.5 fL   Neutro Abs 4,446 1,500 - 7,800 cells/uL   Lymphs Abs 4,237 (H) 850 - 3,900 cells/uL   Absolute Monocytes 589 200 - 950 cells/uL   Eosinophils Absolute 181 15 - 500 cells/uL   Basophils Absolute 48 0 - 200 cells/uL   Neutrophils Relative % 46.8 %   Total Lymphocyte 44.6 %   Monocytes Relative 6.2 %   Eosinophils Relative 1.9 %   Basophils Relative 0.5 %  Lipid panel  Result Value Ref Range   Cholesterol 127 <200 mg/dL   HDL 46 > OR = 40 mg/dL   Triglycerides 134 <150 mg/dL   LDL Cholesterol (Calc) 59 mg/dL (calc)   Total CHOL/HDL Ratio 2.8 <5.0 (calc)   Non-HDL Cholesterol (Calc) 81 <130 mg/dL (calc)  COMPLETE METABOLIC PANEL WITH GFR  Result Value Ref Range   Glucose, Bld 253 (H) 65 - 139 mg/dL   BUN 33 (H) 7 - 25 mg/dL   Creat 1.71 (H) 0.70 - 1.22 mg/dL   eGFR 38 (L) > OR = 60 mL/min/1.32m   BUN/Creatinine Ratio 19 6 - 22 (calc)   Sodium 135 135 - 146 mmol/L   Potassium 5.4 (H)  3.5 - 5.3 mmol/L   Chloride 104 98 - 110 mmol/L   CO2 23 20 - 32 mmol/L   Calcium 9.2 8.6 - 10.3 mg/dL   Total Protein 6.3 6.1 - 8.1 g/dL   Albumin 4.0 3.6 - 5.1 g/dL   Globulin 2.3 1.9 - 3.7 g/dL (calc)   AG Ratio 1.7 1.0 - 2.5 (calc)   Total Bilirubin 0.4 0.2 - 1.2 mg/dL   Alkaline phosphatase (APISO) 87 35 - 144 U/L   AST 12 10 - 35 U/L   ALT 16 9 - 46 U/L   Results for orders placed or performed in visit  on 10/18/20 (from the past 24 hour(s))  POCT UA - Microalbumin     Status: Abnormal   Collection Time: 10/18/20  9:24 AM  Result Value Ref Range   Microalbumin Ur, POC 20 mg/L   Creatinine, POC     Albumin/Creatinine Ratio, Urine, POC         Assessment & Plan:   Problem List Items Addressed This Visit     Type 2 diabetes, controlled, with neuropathy ([place])    Stable DM A1c 7.2 now at goal Complications - CKD-III, peripheral neuropathy, DM retinopathy No further hypoglycemia OFF Trulicity due to side effect  Plan:  1. Continue Lantus 27u daily, Novolog 12 BID wc Encourage improved lifestyle - low carb, low sugar diet, reduce portion size, continue improving regular exercise Check CBG, bring log to next visit for review Continue ASA, ARB, Statin DM Foot exam      Relevant Orders   POCT UA - Microalbumin (Completed)   Major depression, recurrent, full remission ([place])    Improved major depression, moderate recurrent now in remission See improved PHQ GAD scores Off therapy, no longer on wellbutrin or SSRI      CKD (chronic kidney disease), stage III (HCC)    Elevated Cr - discussed hydration and follow-up lab Secondary complication to DM and HTN Improved water intake Monitor q 6-12 months      Benign hypertension with CKD (chronic kidney disease) stage III (HCC)    Well-controlled HTN - Home BP readings controlled  Complication with CKD-III    Plan:  1. DC Losartan ARB 64m daily due to side effects 2. Encourage improved lifestyle - low sodium diet,  regular exercise 3. Continue monitor BP outside office, bring readings to next visit, if persistently >140/90 or new symptoms notify office sooner. Or if low BP < 100/70 and symptomatic      Other Visit Diagnoses     Annual physical exam    -  Primary   Needs flu shot       Relevant Orders   Flu Vaccine QUAD High Dose(Fluad) (Completed)       Updated Health Maintenance information High dose flu shot today Reviewed recent lab results with patient Encouraged improvement to lifestyle with diet and exercise Goal maintain weight    No orders of the defined types were placed in this encounter.   Follow up plan: Return in about 5 months (around 03/20/2021) for 5 month follow-up non fasting lab only then 1 week later Follow-up DM HTN CKD PHQ.  ANobie Putnam [facility] 10/18/2020, 8:44 AM

## 2020-10-18 NOTE — Assessment & Plan Note (Addendum)
Well-controlled HTN - Home BP readings controlled  Complication with CKD-III    Plan:  1. DC Losartan ARB '25mg'$  daily due to side effects 2. Encourage improved lifestyle - low sodium diet, regular exercise 3. Continue monitor BP outside office, bring readings to next visit, if persistently >140/90 or new symptoms notify office sooner. Or if low BP < 100/70 and symptomatic

## 2020-10-18 NOTE — Assessment & Plan Note (Addendum)
Elevated Cr - discussed hydration and follow-up lab Secondary complication to DM and HTN Improved water intake Monitor q 6-12 months

## 2020-10-18 NOTE — Patient Instructions (Addendum)
Thank you for coming to the office today.  Keep up the great work overall.  Keep an eye on BP  Urine test today  Kidney function slightly reduced, recheck next time  Im glad your mood is improved.  DUE for NON FASTING BLOOD WORK   SCHEDULE "Lab Only" visit in the morning at the clinic for lab draw in 5 MONTHS   - Make sure Lab Only appointment is at about 1 week before your next appointment, so that results will be available  For Lab Results, once available within 2-3 days of blood draw, you can can log in to MyChart online to view your results and a brief explanation. Also, we can discuss results at next follow-up visit.   Please schedule a Follow-up Appointment to: Return in about 5 months (around 03/20/2021) for 5 month follow-up non fasting lab only then 1 week later Follow-up DM HTN CKD PHQ.  If you have any other questions or concerns, please feel free to call the office or send a message through [place]. You may also schedule an earlier appointment if necessary.  Additionally, you may be receiving a survey about your experience at our office within a few days to 1 week by e-mail or mail. We value your feedback.  Nobie Putnam, DO [place]

## 2020-10-18 NOTE — Assessment & Plan Note (Signed)
Stable DM A1c 7.2 now at goal Complications - CKD-III, peripheral neuropathy, DM retinopathy No further hypoglycemia OFF Trulicity due to side effect  Plan:  1. Continue Lantus 27u daily, Novolog 12 BID wc Encourage improved lifestyle - low carb, low sugar diet, reduce portion size, continue improving regular exercise Check CBG, bring log to next visit for review Continue ASA, ARB, Statin DM Foot exam

## 2020-10-29 ENCOUNTER — Ambulatory Visit (INDEPENDENT_AMBULATORY_CARE_PROVIDER_SITE_OTHER): Payer: Medicare Other

## 2020-10-29 ENCOUNTER — Telehealth: Payer: Medicare Other | Admitting: General Practice

## 2020-10-29 DIAGNOSIS — I129 Hypertensive chronic kidney disease with stage 1 through stage 4 chronic kidney disease, or unspecified chronic kidney disease: Secondary | ICD-10-CM

## 2020-10-29 DIAGNOSIS — I10 Essential (primary) hypertension: Secondary | ICD-10-CM

## 2020-10-29 DIAGNOSIS — E1169 Type 2 diabetes mellitus with other specified complication: Secondary | ICD-10-CM

## 2020-10-29 DIAGNOSIS — F419 Anxiety disorder, unspecified: Secondary | ICD-10-CM

## 2020-10-29 DIAGNOSIS — N183 Chronic kidney disease, stage 3 unspecified: Secondary | ICD-10-CM

## 2020-10-29 DIAGNOSIS — E1140 Type 2 diabetes mellitus with diabetic neuropathy, unspecified: Secondary | ICD-10-CM

## 2020-10-29 DIAGNOSIS — F3342 Major depressive disorder, recurrent, in full remission: Secondary | ICD-10-CM

## 2020-10-29 NOTE — Patient Instructions (Signed)
Visit Information  PATIENT GOALS:  Goals Addressed             This Visit's Progress    RNCM: Track and Manage My Blood Pressure-Hypertension       Timeframe:  Long-Range Goal Priority:  Medium Start Date:  08-02-2020                           Expected End Date:      08-02-2021                 Follow Up Date 12-24-2020    - check blood pressure weekly - choose a place to take my blood pressure (home, clinic or office, retail store) - write blood pressure results in a log or diary    Why is this important?   You won't feel high blood pressure, but it can still hurt your blood vessels.  High blood pressure can cause heart or kidney problems. It can also cause a stroke.  Making lifestyle changes like losing a little weight or eating less salt will help.  Checking your blood pressure at home and at different times of the day can help to control blood pressure.  If the doctor prescribes medicine remember to take it the way the doctor ordered.  Call the office if you cannot afford the medicine or if there are questions about it.     Notes: The patient states he checks his blood pressure at least twice a week to see how it is doing. He says it usually runs 803 to 212 systolic and 58 to 68 diastolic. Denies any issues with orthostatic hypotension. 09-06-2020: The patient denies issues with blood pressures at this time. Is stable. BP in the office recently was 127/48. Denies any issues with orthostatic hypotension. Will continue to monitor. 10-29-2020: the patient with good blood pressures. Denies any issues. In office on 10-18-2020 the blood pressure was 120/54. The patient states that he is doing much better now.         Patient verbalizes understanding of instructions provided today and agrees to view in [place].   Telephone follow up appointment with care management team member scheduled for: 12-24-2020 at 1:45 pm  [place], MSN, [place] Mobile: [phone]

## 2020-10-29 NOTE — Chronic Care Management (AMB) (Signed)
Chronic Care Management   CCM RN Visit Note  10/29/2020 Name: Charles York MRN: [id number] DOB: 09-Mar-1930  Subjective: Charles York is a 87 y.o. year old male who is a primary care patient of [name], DO. The care management team was consulted for assistance with disease management and care coordination needs.    Engaged with patient by telephone for follow up visit in response to provider referral for case management and/or care coordination services.   Consent to Services:  The patient was given information about Chronic Care Management services, agreed to services, and gave verbal consent prior to initiation of services.  Please see initial visit note for detailed documentation.   Patient agreed to services and verbal consent obtained.   Assessment: Review of patient past medical history, allergies, medications, health status, including review of consultants reports, laboratory and other test data, was performed as part of comprehensive evaluation and provision of chronic care management services.   SDOH (Social Determinants of Health) assessments and interventions performed:    CCM Care Plan  Allergies  Allergen Reactions   Trulicity [Dulaglutide] Swelling    Swollen lips, shortness of breath,    Buspirone     Dizziness   Penicillins Itching and Rash    Has patient had a PCN reaction causing immediate rash, facial/tongue/throat swelling, SOB or lightheadedness with hypotension: No Has patient had a PCN reaction causing severe rash involving mucus membranes or skin necrosis: No Has patient had a PCN reaction that required hospitalization: No Has patient had a PCN reaction occurring within the last 10 years: No If all of the above answers are "NO", then may proceed with Cephalosporin use.     Outpatient Encounter Medications as of 10/29/2020  Medication Sig Note   aspirin 325 MG EC tablet TK 1 T PO QD FOR UP TO 90 DAYS THEN CAN REDUCE TO 81 MG DAILY     atorvastatin (LIPITOR) 40 MG tablet Take 1 tablet (40 mg total) by mouth at bedtime.    ferrous sulfate 325 (65 FE) MG tablet Take 325 mg by mouth every other day.    hydroxypropyl methylcellulose / hypromellose (ISOPTO TEARS / GONIOVISC) 2.5 % ophthalmic solution Place 1 drop into both eyes 4 (four) times daily.    insulin aspart protamine - aspart (NOVOLOG 70/30 MIX) (70-30) 100 UNIT/ML FlexPen Inject 12 Units into the skin 2 (two) times daily. In the morning and evening    ipratropium (ATROVENT) 0.06 % nasal spray USE 2 SPRAYS IN EACH NOSTRIL FOUR TIMES DAILY FOR UP TO 5 TO 7 DAYS THEN STOP (Patient not taking: Reported on 09/11/2020) 12/29/2018: Dropped to twice daily due to excessive dryness.   LANTUS 100 UNIT/ML injection INJECT 27 UNITS SUBCUTANEOUSLY BEFORE BREAKFAST    omeprazole (PRILOSEC) 20 MG capsule TAKE 1 CAPSULE BY MOUTH TWICE A DAY BEFORE A MEAL    polyethylene glycol powder (GLYCOLAX/MIRALAX) 17 GM/SCOOP powder Take 17-34 g by mouth 2 (two) times daily as needed for mild constipation or moderate constipation. (Patient not taking: Reported on 09/11/2020)    sucralfate (CARAFATE) 1 g tablet Take 1 tablet (1 g total) by mouth 4 (four) times daily.    No facility-administered encounter medications on file as of 10/29/2020.    Patient Active Problem List   Diagnosis Date Noted   Major depression, recurrent, full remission ([place]) 10/12/2019   GAD (generalized anxiety disorder) 10/12/2019   Chronic right shoulder pain 08/17/2018   Absolute anemia 07/30/2018   Primary osteoarthritis  involving multiple joints 06/15/2018   GERD (gastroesophageal reflux disease) 11/18/2016   Diabetic retinopathy ([place]) 11/18/2016   Hyperlipidemia associated with type 2 diabetes mellitus ([place]) 11/18/2016   Daytime sleepiness 07/15/2016   Fatigue 07/15/2016   Presbycusis of both ears 07/15/2016   Tinnitus of right ear 07/15/2016   Allergic rhinitis due to allergen 06/09/2016   CKD (chronic kidney disease),  stage III ([place]) 02/27/2016   BPPV (benign paroxysmal positional vertigo), left 02/27/2016   Chronic pansinusitis 02/27/2016   Arthritis 07/03/2015   Colon polyp 07/03/2015   Benign hypertension with CKD (chronic kidney disease) stage III ([place]) 07/03/2015   High cholesterol 07/03/2015   Type 2 diabetes, controlled, with neuropathy ([place]) 07/03/2015    Conditions to be addressed/monitored:HTN, HLD, DMII, Anxiety, and Depression  Care Plan : RNCM: Diabetes Type 2 (Adult)  Updates made by Vanita Ingles, RN since 10/29/2020 12:00 AM     Problem: RNCM: Glycemic Management (Diabetes, Type 2)   Priority: Medium     Long-Range Goal: RNCM: Glycemic Management Optimized   Start Date: 03/22/2020  Expected End Date: 03/30/2021  This Visit's Progress: On track  Recent Progress: On track  Priority: Medium  Note:   Objective:  Lab Results  Component Value Date   HGBA1C 7.2 (H) 10/12/2020     Lab Results  Component Value Date   CREATININE 1.71 (H) 10/12/2020   CREATININE 1.58 (H) 09/15/2020   CREATININE 1.32 (H) 01/20/2020   No results found for: EGFR Current Barriers:  Knowledge Deficits related to basic Diabetes pathophysiology and self care/management Knowledge Deficits related to medications used for management of diabetes Lacks social connections Does not contact provider office for questions/concerns Case Manager Clinical Goal(s):  patient will demonstrate improved adherence to prescribed treatment plan for diabetes self care/management as evidenced by: daily monitoring and recording of CBG  adherence to ADA/ carb modified diet adherence to prescribed medication regimen contacting provider for new or worsened symptoms or questions Interventions:  Collaboration with Parks Ranger, Devonne Doughty, DO regarding development and update of comprehensive plan of care as evidenced by provider attestation and co-signature Inter-disciplinary care team collaboration (see longitudinal plan of  care) Provided education to patient about basic DM disease process. Discussed fasting  blood sugar goal <130 and post prandial of <180 Reviewed medications with patient and discussed importance of medication adherence. 10-29-2020: The patient is compliant with medications. The patient denies any medication needs. Takes Lantus 27 units daily and Novolog 12 units BID  Discussed plans with patient for ongoing care management follow up and provided patient with direct contact information for care management team Provided patient with written educational materials related to hypo and hyperglycemia and importance of correct treatment. 10-29-2020: Denies any lows at this time. States he is doing well and eating good.  Advised patient, providing education and rationale, to check cbg bid and record, calling pcp for findings outside established parameters.  09-06-2020: The patient states that his blood sugars BID,  fasting are 130-135 consistently. The patient states his blood sugars likely will be elevated tonight as he is cooking steak on the grill and having some cherry pie. The patient will have hemoglobin A1C check in September with additional blood work. 10-29-2020: The patient with stable A1C. The patient denies any acute findings related to DM and management. States he feels good and is eating well. Will continue to monitor for changes.  Review of patient status, including review of consultants reports, relevant laboratory and other test results, and medications completed.  Patient Goals/Self-Care Activities - Self administers oral medications as prescribed Self administers insulin as prescribed Attends all scheduled provider appointments Checks blood sugars as prescribed and utilize hyper and hypoglycemia protocol as needed Adheres to prescribed ADA/carb modified - barriers to adherence to treatment plan identified - blood glucose monitoring encouraged - blood glucose readings reviewed - mutual A1C goal set  or reviewed - resources required to improve adherence to care identified - self-awareness of signs/symptoms of hypo or hyperglycemia encouraged - use of blood glucose monitoring log promoted Follow Up Plan: Telephone follow up appointment with care management team member scheduled for: 12-24-2020 at 145pm    Care Plan : RNCM: Hypertension (Adult)  Updates made by Vanita Ingles, RN since 10/29/2020 12:00 AM     Problem: RNCM: Hypertension (Hypertension)   Priority: Medium     Long-Range Goal: RNCM: Hypertension Monitored   Start Date: 03/22/2020  Expected End Date: 03/30/2021  This Visit's Progress: On track  Recent Progress: On track  Priority: Medium  Note:   Objective:  Last practice recorded BP readings:  BP Readings from Last 3 Encounters:  10/18/20 (!) 120/54  09/15/20 135/70  09/03/20 (!) 127/48    Most recent eGFR/CrCl: No results found for: EGFR  No components found for: CRCL Current Barriers:  Knowledge Deficits related to basic understanding of hypertension pathophysiology and self care management Knowledge Deficits related to understanding of medications prescribed for management of hypertension Unable to independently manage HTN Does not contact provider office for questions/concerns Case Manager Clinical Goal(s):  patient will verbalize understanding of plan for hypertension management patient will demonstrate improved adherence to prescribed treatment plan for hypertension as evidenced by taking all medications as prescribed, monitoring and recording blood pressure as directed, adhering to low sodium/DASH diet patient will demonstrate improved health management independence as evidenced by checking blood pressure as directed and notifying PCP if SBP>150 or DBP > 90, taking all medications as prescribe, and adhering to a low sodium diet as discussed. patient will verbalize basic understanding of hypertension disease process and self health management plan as  evidenced by compliance with heart healthy/ADA diet, compliance with medications and working with the CCM team to optimize health and well being.  Interventions:  Collaboration with [name], DO regarding development and update of comprehensive plan of care as evidenced by provider attestation and co-signature Inter-disciplinary care team collaboration (see longitudinal plan of care) Evaluation of current treatment plan related to hypertension self management and patient's adherence to plan as established by provider. 10-29-2020: The patient is doing well with management of his blood pressures. Denies any issues related to HTN. Will continue to monitor.  Provided education to patient re: stroke prevention, s/s of heart attack and stroke, DASH diet, complications of uncontrolled blood pressure Reviewed medications with patient and discussed importance of compliance. 10-29-2020: The patient is compliant with medications  Discussed plans with patient for ongoing care management follow up and provided patient with direct contact information for care management team Advised patient, providing education and rationale, to monitor blood pressure daily and record, calling PCP for findings outside established parameters.  Self-Care Activities: - Self administers medications as prescribed Attends all scheduled provider appointments Calls provider office for new concerns, questions, or BP outside discussed parameters Checks BP and records as discussed Follows a low sodium diet/DASH diet Patient Goals: - check blood pressure weekly - choose a place to take my blood pressure (home, clinic or office, retail store) - write blood pressure results in  a log or diary - agree on reward when goals are met - agree to work together to make changes - ask questions to understand - have a family meeting to talk about healthy habits - learn about high blood pressure  Follow Up Plan: Telephone follow up  appointment with care management team member scheduled for: 12-24-2020 at 145 pm    Care Plan : RNCM: HLD Management  Updates made by Vanita Ingles, RN since 10/29/2020 12:00 AM     Problem: RNCM: HLD Management   Priority: Medium     Long-Range Goal: RNCM: HLD Management   Start Date: 03/22/2020  Expected End Date: 03/30/2021  This Visit's Progress: On track  Recent Progress: On track  Priority: Medium  Note:   Current Barriers:  Poorly controlled hyperlipidemia, complicated by HTN, DM, shoulder pain, depression and anxiety  Current antihyperlipidemic regimen: Lipitor 40 mg QD Most recent lipid panel:     Component Value Date/Time   CHOL 127 10/12/2020 0819   TRIG 134 10/12/2020 0819   HDL 46 10/12/2020 0819   CHOLHDL 2.8 10/12/2020 0819   LDLCALC 59 10/12/2020 0819   ASCVD risk enhancing conditions: age >73, DM, HTN, CKD,  former smoker Lacks Scientist, product/process development Does not contact provider office for Health visitor Clinical Goal(s):  patient will work with Consulting civil engineer, providers, and care team towards execution of optimized self-health management plan patient will verbalize understanding of plan for HLD patient will work with [place] and pcp to address needs related to effective management of HLD   Interventions: Collaboration with Parks Ranger, Devonne Doughty, DO regarding development and update of comprehensive plan of care as evidenced by provider attestation and co-signature Inter-disciplinary care team collaboration (see longitudinal plan of care) Medication review performed; medication list updated in electronic medical record.  Inter-disciplinary care team collaboration (see longitudinal plan of care) Referred to pharmacy team for assistance with HLD medication management Evaluation of current treatment plan related to HLD  and patient's adherence to plan as established by provider. 09-06-2020: The patient denies any issues at this time. Is compliant  with plan of care. Will have new blood work coming up in September of 2022. 10-29-2020: The patient is doing well. Labwork for HLD stable. Feels he is managing well. Will continue to monitor.  Advised patient to call the office for changes or questions Provided education to patient re: heart healthy/ADA diet, taking medications as prescribed and working with CCM team to optimize health and well being. Education on activity level and safety.  Discussed plans with patient for ongoing care management follow up and provided patient with direct contact information for care management team   Patient Goals/Self-Care Activities:  patient will:   - call for medicine refill 2 or 3 days before it runs out - call if I am sick and can't take my medicine - keep a list of all the medicines I take; vitamins and herbals too - learn to read medicine labels - use a pillbox to sort medicine - use an alarm clock or phone to remind me to take my medicine - change to whole grain breads, cereal, pasta - drink 6 to 8 glasses of water each day - eat 3 to 5 servings of fruits and vegetables each day - eat 5 or 6 small meals each day - fill half the plate with nonstarchy vegetables - limit fast food meals to no more than 1 per week - manage portion size - prepare main meal  at home 3 to 5 days each week - read food labels for fat, fiber, carbohydrates and portion size - be open to making changes - I can manage, know and watch for signs of a heart attack - if I have chest pain, call for help - learn about small changes that will make a big difference - learn my personal risk factors  - barriers to meeting goals identified - change-talk evoked - choices provided - decision-making supported - health risks reviewed - problem-solving facilitated - questions answered - readiness for change evaluated - reassurance provided - self-reflection promoted - self-reliance encouraged  Follow Up Plan: Telephone follow up  appointment with care management team member scheduled for: 12-24-2020 at 145 pm       Care Plan : RNCM: Depression (Adult) and Anxiety  Updates made by Vanita Ingles, RN since 10/29/2020 12:00 AM     Problem: RNCM: Symptoms (Depression) and Anxiety   Priority: High  Onset Date: 09/06/2020     Long-Range Goal: Symptoms Monitored and Managed   Start Date: 09/06/2020  Expected End Date: 09/06/2021  This Visit's Progress: On track  Recent Progress: On track  Priority: High  Note:    Current Barriers:  Ineffective Self Health Maintenance in a patient with Anxiety and Depression Unable to independently manage reoccurrence of depression and anxiety Lacks social connections Clinical Goal(s):  Collaboration with Parks Ranger, Devonne Doughty, DO regarding development and update of comprehensive plan of care as evidenced by provider attestation and co-signature Inter-disciplinary care team collaboration (see longitudinal plan of care) patient will work with care management team to address care coordination and chronic disease management needs related to Disease Management Educational Needs Care Coordination Mental Health Counseling   Interventions:  Evaluation of current treatment plan related to Anxiety and Depression, Limited social support and Mental Health Concerns  self-management and patient's adherence to plan as established by provider. 10-29-2020: The patient states that he is doing a lot better with his depression and anxiety. He says sometimes he gets down and depressed. He is good about letting the pcp know when he is not feeling well or things change. Let the patient express his feelings. Will continue to monitor for changes and needs. The patient knows to call the pcp or RNCM for needs and changes in mood, anxiety, and depression.  Collaboration with [name], DO regarding development and update of comprehensive plan of care as evidenced by provider attestation       and  co-signature Inter-disciplinary care team collaboration (see longitudinal plan of care) Discussed plans with patient for ongoing care management follow up and provided patient with direct contact information for care management team Self Care Activities:  Patient verbalizes understanding of plan to effectively manage depression and anxiety Self administers medications as prescribed Attends all scheduled provider appointments Calls pharmacy for medication refills Attends church or other social activities Performs ADL's independently Performs IADL's independently Calls provider office for new concerns or questions Patient Goals: - activity or exercise based on tolerance encouraged - emotional support provided - healthy lifestyle promoted - medication side effects monitored and managed - pain managed - participation in mental health treatment encouraged - quality of sleep assessed - response to mental health treatment monitored - response to pharmacologic therapy monitored - sleep hygiene techniques encouraged - social activities and relationships encouraged Follow Up Plan: Telephone follow up appointment with care management team member scheduled for: 12-24-2020 at 1:45 pm     Plan:Telephone follow up appointment with  care management team member scheduled for:  12-24-2020 at 1:45 pm  [place], MSN, [place] Mobile: [phone]

## 2020-11-02 DIAGNOSIS — I10 Essential (primary) hypertension: Secondary | ICD-10-CM | POA: Diagnosis not present

## 2020-11-02 DIAGNOSIS — N183 Chronic kidney disease, stage 3 unspecified: Secondary | ICD-10-CM | POA: Diagnosis not present

## 2020-11-02 DIAGNOSIS — E1169 Type 2 diabetes mellitus with other specified complication: Secondary | ICD-10-CM | POA: Diagnosis not present

## 2020-11-02 DIAGNOSIS — I129 Hypertensive chronic kidney disease with stage 1 through stage 4 chronic kidney disease, or unspecified chronic kidney disease: Secondary | ICD-10-CM | POA: Diagnosis not present

## 2020-11-02 DIAGNOSIS — E1140 Type 2 diabetes mellitus with diabetic neuropathy, unspecified: Secondary | ICD-10-CM | POA: Diagnosis not present

## 2020-11-02 DIAGNOSIS — E785 Hyperlipidemia, unspecified: Secondary | ICD-10-CM

## 2020-11-02 DIAGNOSIS — F3342 Major depressive disorder, recurrent, in full remission: Secondary | ICD-10-CM | POA: Diagnosis not present

## 2020-11-13 LAB — HM DIABETES EYE EXAM

## 2020-11-17 ENCOUNTER — Other Ambulatory Visit: Payer: Self-pay | Admitting: Family Medicine

## 2020-11-17 NOTE — Telephone Encounter (Signed)
Called [facility] and spoke with Maudie Mercury Pharmacist- informed that the losartan was dc 'd on 10/18/20 due to side effects. Kim advised to refuse the order. Requested Prescriptions  Pending Prescriptions Disp Refills   losartan (COZAAR) 25 MG tablet [Pharmacy Med Name: LOSARTAN POTASSIUM 25 MG TAB] 90 tablet 2    Sig: TAKE 1 TABLET BY MOUTH EVERY DAY     Cardiovascular:  Angiotensin Receptor Blockers Failed - 11/17/2020 10:23 AM      Failed - Cr in normal range and within 180 days    Creat  Date Value Ref Range Status  10/12/2020 1.71 (H) 0.70 - 1.22 mg/dL Final          Failed - K in normal range and within 180 days    Potassium  Date Value Ref Range Status  10/12/2020 5.4 (H) 3.5 - 5.3 mmol/L Final          Passed - Patient is not pregnant      Passed - Last BP in normal range    BP Readings from Last 1 Encounters:  10/18/20 (!) 120/54          Passed - Valid encounter within last 6 months    Recent Outpatient Visits           1 month ago Annual physical exam   [place], DO   2 months ago Major depressive disorder, recurrent, moderate ([place])   [place], DO   8 months ago Entrapment of right ulnar nerve   [place], DO   10 months ago Major depressive disorder, recurrent, moderate ([place])   [facility] [name], DO   1 year ago Essential hypertension   [place], DO       Future Appointments             In 4 months Parks Ranger, Devonne Doughty, DO [facility], [place]   In 10 months  [facility], [facility]

## 2020-11-24 ENCOUNTER — Other Ambulatory Visit: Payer: Self-pay | Admitting: Family Medicine

## 2020-11-24 DIAGNOSIS — K219 Gastro-esophageal reflux disease without esophagitis: Secondary | ICD-10-CM

## 2020-11-24 DIAGNOSIS — R110 Nausea: Secondary | ICD-10-CM

## 2020-11-24 NOTE — Telephone Encounter (Signed)
Requested Prescriptions  Pending Prescriptions Disp Refills  . omeprazole (PRILOSEC) 20 MG capsule [Pharmacy Med Name: OMEPRAZOLE DR 20 MG CAPSULE] 180 capsule 1    Sig: TAKE 1 CAPSULE BY MOUTH TWICE A DAY BEFORE A MEAL     Gastroenterology: Proton Pump Inhibitors Passed - 11/24/2020  9:06 AM      Passed - Valid encounter within last 12 months    Recent Outpatient Visits          1 month ago Annual physical exam   [place], DO   2 months ago Major depressive disorder, recurrent, moderate ([place])   [place], DO   9 months ago Entrapment of right ulnar nerve   [place], DO   10 months ago Major depressive disorder, recurrent, moderate ([place])   [facility] [name], DO   1 year ago Essential hypertension   [place], DO      Future Appointments            In 3 months Parks Ranger, Devonne Doughty, DO [facility], [place]   In 9 months  [facility], [facility]

## 2020-12-18 ENCOUNTER — Other Ambulatory Visit: Payer: Self-pay | Admitting: Internal Medicine

## 2020-12-18 DIAGNOSIS — F331 Major depressive disorder, recurrent, moderate: Secondary | ICD-10-CM

## 2020-12-18 DIAGNOSIS — F411 Generalized anxiety disorder: Secondary | ICD-10-CM

## 2020-12-24 ENCOUNTER — Ambulatory Visit (INDEPENDENT_AMBULATORY_CARE_PROVIDER_SITE_OTHER): Payer: Medicare Other

## 2020-12-24 ENCOUNTER — Telehealth: Payer: Medicare Other

## 2020-12-24 DIAGNOSIS — I10 Essential (primary) hypertension: Secondary | ICD-10-CM

## 2020-12-24 DIAGNOSIS — I129 Hypertensive chronic kidney disease with stage 1 through stage 4 chronic kidney disease, or unspecified chronic kidney disease: Secondary | ICD-10-CM

## 2020-12-24 DIAGNOSIS — F419 Anxiety disorder, unspecified: Secondary | ICD-10-CM

## 2020-12-24 DIAGNOSIS — E1169 Type 2 diabetes mellitus with other specified complication: Secondary | ICD-10-CM

## 2020-12-24 DIAGNOSIS — F3342 Major depressive disorder, recurrent, in full remission: Secondary | ICD-10-CM

## 2020-12-24 DIAGNOSIS — E1140 Type 2 diabetes mellitus with diabetic neuropathy, unspecified: Secondary | ICD-10-CM

## 2020-12-24 NOTE — Patient Instructions (Signed)
Visit Information  Thank you for taking time to visit with me today. Please don't hesitate to contact me if I can be of assistance to you before our next scheduled telephone appointment.  Following are the goals we discussed today:  Patient will verbalize basic understanding of HTN, HLD, DMII, Anxiety, and Depression disease process and self health management plan as evidenced by following the plan of care, taking medications, monitoring and following dietary restrictions and working with the CCM team to effectively manage health and well being take all medications exactly as prescribed and will call provider for medication related questions as evidenced by compliance and calling for refills before running out of medications     attend all scheduled medical appointments: 03-20-2021 as evidenced by keeping appointments and call to reschedule appointments if needed         continue to work with [place] and/or Social Worker to address care management and care coordination needs related to HTN, HLD, DMII, Anxiety, and Depression as evidenced by adherence to CM Team Scheduled appointments     demonstrate a decrease in HTN, HLD, DMII, Anxiety, and Depression exacerbations  as evidenced by working with the CCM team to optimize plan of care for chronic conditions demonstrate ongoing self health care management ability to effectively manage chronic conditions  as evidenced by working with the CCM team through collaboration with Consulting civil engineer, provider, and care team.    Interventions: 1:1 collaboration with primary care provider regarding development and update of comprehensive plan of care as evidenced by provider attestation and co-signature Inter-disciplinary care team collaboration (see longitudinal plan of care) Evaluation of current treatment plan related to  self management and patient's adherence to plan as established by provider     Diabetes:  (Status: Goal on Track (progressing): YES.)  Long Term Goal         Lab Results  Component Value Date    HGBA1C 7.2 (H) 10/12/2020  Assessed patient's understanding of A1c goal: <7% Provided education to patient about basic DM disease process; Reviewed medications with patient and discussed importance of medication adherence;        Reviewed prescribed diet with patient heart healthy/ADA; Counseled on importance of regular laboratory monitoring as prescribed;        Discussed plans with patient for ongoing care management follow up and provided patient with direct contact information for care management team;      Provided patient with written educational materials related to hypo and hyperglycemia and importance of correct treatment;       Reviewed scheduled/upcoming provider appointments including: 03-20-2021 at 0820 am;         call provider for findings outside established parameters;       Review of patient status, including review of consultants reports, relevant laboratory and other test results, and medications completed;         Anxiety and Depression   (Status: Goal on Track (progressing): YES.) Long Term Goal  Evaluation of current treatment plan related to Anxiety and Depression, Limited social support, Mental Health Concerns , and Social Isolation self-management and patient's adherence to plan as established by provider. Discussed plans with patient for ongoing care management follow up and provided patient with direct contact information for care management team Advised patient to call the office for changes in mood, depression, and anxiety; Provided education to patient re: being around others who make him happy, talking to family and friends on the phone and when feeling down do  activities that help him feel better.  12-24-2020: The patient states he and his daughter will be having Thanksgiving together. He was getting ready to take a nap. He states he is in a good place with his depression and anxiety at this time. Will  continue to monitor for changes; Reviewed medications with patient and discussed compliance ; Reviewed scheduled/upcoming provider appointments including 03-20-2021 at [place] am; Social Work referral for ongoing support and education; Discussed plans with patient for ongoing care management follow up and provided patient with direct contact information for care management team; Screening for signs and symptoms of depression related to chronic disease state;  Assessed social determinant of health barriers;    Hyperlipidemia:  (Status: Goal on Track (progressing): YES.) Long Term Goal       Lab Results  Component Value Date    CHOL 127 10/12/2020    HDL 46 10/12/2020    LDLCALC 59 10/12/2020    TRIG 134 10/12/2020    CHOLHDL 2.8 10/12/2020      Medication review performed; medication list updated in electronic medical record.  Provider established cholesterol goals reviewed; Counseled on importance of regular laboratory monitoring as prescribed; Provided HLD educational materials; Reviewed role and benefits of statin for ASCVD risk reduction; Discussed strategies to manage statin-induced myalgias; Reviewed importance of limiting foods high in cholesterol;   Hypertension: (Status: Goal on Track (progressing): YES.) Last practice recorded BP readings:     BP Readings from Last 3 Encounters:  10/18/20 (!) 120/54  09/15/20 135/70  09/03/20 (!) 127/48  Most recent eGFR/CrCl:       Lab Results  Component Value Date    EGFR 38 (L) 10/12/2020    No components found for: CRCL   Evaluation of current treatment plan related to hypertension self management and patient's adherence to plan as established by provider;   Provided education to patient re: stroke prevention, s/s of heart attack and stroke; Reviewed prescribed diet heart healthy/ADA diet  Reviewed medications with patient and discussed importance of compliance;  Discussed plans with patient for ongoing care management follow up  and provided patient with direct contact information for care management team; Advised patient, providing education and rationale, to monitor blood pressure daily and record, calling PCP for findings outside established parameters;  Advised patient to discuss blood pressure trends, any onset of light headed or dizziness, new concerns with provider; Provided education on prescribed diet heart healthy/ADA diet ;  Discussed complications of poorly controlled blood pressure such as heart disease, stroke, circulatory complications, vision complications, kidney impairment, sexual dysfunction;    Patient Goals/Self-Care Activities: Take medications as prescribed   Attend all scheduled provider appointments Call pharmacy for medication refills 3-7 days in advance of running out of medications Attend church or other social activities Perform all self care activities independently  Perform IADL's (shopping, preparing meals, housekeeping, managing finances) independently Call provider office for new concerns or questions  Work with the social worker to address care coordination needs and will continue to work with the clinical team to address health care and disease management related needs call 911 call the Suicide and Crisis Lifeline: 988 call the [place] National Suicide Prevention Lifeline: 365-778-5283 call 1-800-273-TALK (toll free, 24 hour hotline) if experiencing a Mental Health or [place]  keep appointment with eye doctor check feet daily for cuts, sores or redness trim toenails straight across drink 6 to 8 glasses of water each day eat fish at least once per week fill half of  plate with vegetables limit fast food meals to no more than 1 per week manage portion size prepare main meal at home 3 to 5 days each week read food labels for fat, fiber, carbohydrates and portion size reduce red meat to 2 to 3 times a week keep feet up while sitting wash and dry feet carefully  every day wear comfortable, cotton socks wear comfortable, well-fitting shoes check blood pressure weekly choose a place to take my blood pressure (home, clinic or office, retail store) write blood pressure results in a log or diary learn about high blood pressure keep a blood pressure log take blood pressure log to all doctor appointments call doctor for signs and symptoms of high blood pressure develop an action plan for high blood pressure keep all doctor appointments take medications for blood pressure exactly as prescribed report new symptoms to your doctor eat more whole grains, fruits and vegetables, lean meats and healthy fats - call for medicine refill 2 or 3 days before it runs out - take all medications exactly as prescribed - call doctor with any symptoms you believe are related to your medicine - call doctor when you experience any new symptoms - go to all doctor appointments as scheduled - adhere to prescribed diet: heart healthy/ADA diet   Our next appointment is by telephone on 02-25-2021 at 1 pm  Please call the care guide team at [phone] if you need to cancel or reschedule your appointment.   Please call 911 call the Suicide and Crisis Lifeline: 988 call the [place] National Suicide Prevention Lifeline: 248-290-8150 call 1-800-273-TALK (toll free, 24 hour hotline) if you are experiencing a Mental Health or [place] or need someone to talk to.  Patient verbalizes understanding of instructions provided today and agrees to view in [place].   Noreene Larsson RN, MSN, [facility] [place] Mobile: [phone]

## 2020-12-24 NOTE — Chronic Care Management (AMB) (Signed)
Chronic Care Management   CCM RN Visit Note  12/24/2020 Name: Charles York MRN: [id number] DOB: 1930/07/02  Subjective: Charles York is a 87 y.o. year old male who is a primary care patient of [name], DO. The care management team was consulted for assistance with disease management and care coordination needs.    Engaged with patient by telephone for follow up visit in response to provider referral for case management and/or care coordination services.   Consent to Services:  The patient was given information about Chronic Care Management services, agreed to services, and gave verbal consent prior to initiation of services.  Please see initial visit note for detailed documentation.   Patient agreed to services and verbal consent obtained.   Assessment: Review of patient past medical history, allergies, medications, health status, including review of consultants reports, laboratory and other test data, was performed as part of comprehensive evaluation and provision of chronic care management services.   SDOH (Social Determinants of Health) assessments and interventions performed:    CCM Care Plan  Allergies  Allergen Reactions   Trulicity [Dulaglutide] Swelling    Swollen lips, shortness of breath,    Buspirone     Dizziness   Penicillins Itching and Rash    Has patient had a PCN reaction causing immediate rash, facial/tongue/throat swelling, SOB or lightheadedness with hypotension: No Has patient had a PCN reaction causing severe rash involving mucus membranes or skin necrosis: No Has patient had a PCN reaction that required hospitalization: No Has patient had a PCN reaction occurring within the last 10 years: No If all of the above answers are "NO", then may proceed with Cephalosporin use.     Outpatient Encounter Medications as of 12/24/2020  Medication Sig Note   aspirin 325 MG EC tablet TK 1 T PO QD FOR UP TO 90 DAYS THEN CAN REDUCE TO 81 MG DAILY     atorvastatin (LIPITOR) 40 MG tablet Take 1 tablet (40 mg total) by mouth at bedtime.    ferrous sulfate 325 (65 FE) MG tablet Take 325 mg by mouth every other day.    hydroxypropyl methylcellulose / hypromellose (ISOPTO TEARS / GONIOVISC) 2.5 % ophthalmic solution Place 1 drop into both eyes 4 (four) times daily.    insulin aspart protamine - aspart (NOVOLOG 70/30 MIX) (70-30) 100 UNIT/ML FlexPen Inject 12 Units into the skin 2 (two) times daily. In the morning and evening    ipratropium (ATROVENT) 0.06 % nasal spray USE 2 SPRAYS IN EACH NOSTRIL FOUR TIMES DAILY FOR UP TO 5 TO 7 DAYS THEN STOP (Patient not taking: Reported on 09/11/2020) 12/29/2018: Dropped to twice daily due to excessive dryness.   LANTUS 100 UNIT/ML injection INJECT 27 UNITS SUBCUTANEOUSLY BEFORE BREAKFAST    omeprazole (PRILOSEC) 20 MG capsule TAKE 1 CAPSULE BY MOUTH TWICE A DAY BEFORE A MEAL    polyethylene glycol powder (GLYCOLAX/MIRALAX) 17 GM/SCOOP powder Take 17-34 g by mouth 2 (two) times daily as needed for mild constipation or moderate constipation. (Patient not taking: Reported on 09/11/2020)    sucralfate (CARAFATE) 1 g tablet Take 1 tablet (1 g total) by mouth 4 (four) times daily.    No facility-administered encounter medications on file as of 12/24/2020.    Patient Active Problem List   Diagnosis Date Noted   Major depression, recurrent, full remission ([place]) 10/12/2019   GAD (generalized anxiety disorder) 10/12/2019   Chronic right shoulder pain 08/17/2018   Absolute anemia 07/30/2018   Primary osteoarthritis  involving multiple joints 06/15/2018   GERD (gastroesophageal reflux disease) 11/18/2016   Diabetic retinopathy ([place]) 11/18/2016   Hyperlipidemia associated with type 2 diabetes mellitus ([place]) 11/18/2016   Daytime sleepiness 07/15/2016   Fatigue 07/15/2016   Presbycusis of both ears 07/15/2016   Tinnitus of right ear 07/15/2016   Allergic rhinitis due to allergen 06/09/2016   CKD (chronic kidney disease),  stage III ([place]) 02/27/2016   BPPV (benign paroxysmal positional vertigo), left 02/27/2016   Chronic pansinusitis 02/27/2016   Arthritis 07/03/2015   Colon polyp 07/03/2015   Benign hypertension with CKD (chronic kidney disease) stage III ([facility]) 07/03/2015   High cholesterol 07/03/2015   Type 2 diabetes, controlled, with neuropathy ([place]) 07/03/2015    Conditions to be addressed/monitored:HTN, HLD, DMII, Anxiety, and Depression  Care Plan : RNCM: Diabetes Type 2 (Adult)  Updates made by Vanita Ingles, RN since 12/24/2020 12:00 AM  Completed 12/24/2020   Problem: RNCM: Glycemic Management (Diabetes, Type 2) Resolved 12/24/2020  Priority: Medium     Long-Range Goal: RNCM: Glycemic Management Optimized Completed 12/24/2020  Start Date: 03/22/2020  Expected End Date: 03/30/2021  Recent Progress: On track  Priority: Medium  Note:   Objective: Resolving, duplicate goal  Lab Results  Component Value Date   HGBA1C 7.2 (H) 10/12/2020     Lab Results  Component Value Date   CREATININE 1.71 (H) 10/12/2020   CREATININE 1.58 (H) 09/15/2020   CREATININE 1.32 (H) 01/20/2020   No results found for: EGFR Current Barriers:  Knowledge Deficits related to basic Diabetes pathophysiology and self care/management Knowledge Deficits related to medications used for management of diabetes Lacks social connections Does not contact provider office for questions/concerns Case Manager Clinical Goal(s):  patient will demonstrate improved adherence to prescribed treatment plan for diabetes self care/management as evidenced by: daily monitoring and recording of CBG  adherence to ADA/ carb modified diet adherence to prescribed medication regimen contacting provider for new or worsened symptoms or questions Interventions:  Collaboration with Parks Ranger, Devonne Doughty, DO regarding development and update of comprehensive plan of care as evidenced by provider attestation and co-signature Inter-disciplinary care  team collaboration (see longitudinal plan of care) Provided education to patient about basic DM disease process. Discussed fasting  blood sugar goal <130 and post prandial of <180 Reviewed medications with patient and discussed importance of medication adherence. 10-29-2020: The patient is compliant with medications. The patient denies any medication needs. Takes Lantus 27 units daily and Novolog 12 units BID  Discussed plans with patient for ongoing care management follow up and provided patient with direct contact information for care management team Provided patient with written educational materials related to hypo and hyperglycemia and importance of correct treatment. 10-29-2020: Denies any lows at this time. States he is doing well and eating good.  Advised patient, providing education and rationale, to check cbg bid and record, calling pcp for findings outside established parameters.  09-06-2020: The patient states that his blood sugars BID,  fasting are 130-135 consistently. The patient states his blood sugars likely will be elevated tonight as he is cooking steak on the grill and having some cherry pie. The patient will have hemoglobin A1C check in September with additional blood work. 10-29-2020: The patient with stable A1C. The patient denies any acute findings related to DM and management. States he feels good and is eating well. Will continue to monitor for changes.  Review of patient status, including review of consultants reports, relevant laboratory and other test results, and medications completed.  Patient Goals/Self-Care Activities - Self administers oral medications as prescribed Self administers insulin as prescribed Attends all scheduled provider appointments Checks blood sugars as prescribed and utilize hyper and hypoglycemia protocol as needed Adheres to prescribed ADA/carb modified - barriers to adherence to treatment plan identified - blood glucose monitoring encouraged - blood  glucose readings reviewed - mutual A1C goal set or reviewed - resources required to improve adherence to care identified - self-awareness of signs/symptoms of hypo or hyperglycemia encouraged - use of blood glucose monitoring log promoted Follow Up Plan: Telephone follow up appointment with care management team member scheduled for: 12-24-2020 at 145pm    Care Plan : RNCM: Hypertension (Adult)  Updates made by Vanita Ingles, RN since 12/24/2020 12:00 AM  Completed 12/24/2020   Problem: RNCM: Hypertension (Hypertension) Resolved 12/24/2020  Priority: Medium     Long-Range Goal: RNCM: Hypertension Monitored Completed 12/24/2020  Start Date: 03/22/2020  Expected End Date: 03/30/2021  Recent Progress: On track  Priority: Medium  Note:   Objective: Resolving, duplicate goal  Last practice recorded BP readings:  BP Readings from Last 3 Encounters:  10/18/20 (!) 120/54  09/15/20 135/70  09/03/20 (!) 127/48    Most recent eGFR/CrCl: No results found for: EGFR  No components found for: CRCL Current Barriers:  Knowledge Deficits related to basic understanding of hypertension pathophysiology and self care management Knowledge Deficits related to understanding of medications prescribed for management of hypertension Unable to independently manage HTN Does not contact provider office for questions/concerns Case Manager Clinical Goal(s):  patient will verbalize understanding of plan for hypertension management patient will demonstrate improved adherence to prescribed treatment plan for hypertension as evidenced by taking all medications as prescribed, monitoring and recording blood pressure as directed, adhering to low sodium/DASH diet patient will demonstrate improved health management independence as evidenced by checking blood pressure as directed and notifying PCP if SBP>150 or DBP > 90, taking all medications as prescribe, and adhering to a low sodium diet as discussed. patient will  verbalize basic understanding of hypertension disease process and self health management plan as evidenced by compliance with heart healthy/ADA diet, compliance with medications and working with the CCM team to optimize health and well being.  Interventions:  Collaboration with [name], DO regarding development and update of comprehensive plan of care as evidenced by provider attestation and co-signature Inter-disciplinary care team collaboration (see longitudinal plan of care) Evaluation of current treatment plan related to hypertension self management and patient's adherence to plan as established by provider. 10-29-2020: The patient is doing well with management of his blood pressures. Denies any issues related to HTN. Will continue to monitor.  Provided education to patient re: stroke prevention, s/s of heart attack and stroke, DASH diet, complications of uncontrolled blood pressure Reviewed medications with patient and discussed importance of compliance. 10-29-2020: The patient is compliant with medications  Discussed plans with patient for ongoing care management follow up and provided patient with direct contact information for care management team Advised patient, providing education and rationale, to monitor blood pressure daily and record, calling PCP for findings outside established parameters.  Self-Care Activities: - Self administers medications as prescribed Attends all scheduled provider appointments Calls provider office for new concerns, questions, or BP outside discussed parameters Checks BP and records as discussed Follows a low sodium diet/DASH diet Patient Goals: - check blood pressure weekly - choose a place to take my blood pressure (home, clinic or office, retail store) - write blood pressure results in  a log or diary - agree on reward when goals are met - agree to work together to make changes - ask questions to understand - have a family meeting to talk  about healthy habits - learn about high blood pressure  Follow Up Plan: Telephone follow up appointment with care management team member scheduled for: 12-24-2020 at 145 pm    Care Plan : RNCM: HLD Management  Updates made by Vanita Ingles, RN since 12/24/2020 12:00 AM  Completed 12/24/2020   Problem: RNCM: HLD Management Resolved 12/24/2020  Priority: Medium     Long-Range Goal: RNCM: HLD Management Completed 12/24/2020  Start Date: 03/22/2020  Expected End Date: 03/30/2021  Recent Progress: On track  Priority: Medium  Note:   Current Barriers: Resolving, duplicate goal Poorly controlled hyperlipidemia, complicated by HTN, DM, shoulder pain, depression and anxiety  Current antihyperlipidemic regimen: Lipitor 40 mg QD Most recent lipid panel:     Component Value Date/Time   CHOL 127 10/12/2020 0819   TRIG 134 10/12/2020 0819   HDL 46 10/12/2020 0819   CHOLHDL 2.8 10/12/2020 0819   LDLCALC 59 10/12/2020 0819   ASCVD risk enhancing conditions: age >64, DM, HTN, CKD,  former smoker Lacks Scientist, product/process development Does not contact provider office for Health visitor Clinical Goal(s):  patient will work with Consulting civil engineer, providers, and care team towards execution of optimized self-health management plan patient will verbalize understanding of plan for HLD patient will work with [place] and pcp to address needs related to effective management of HLD   Interventions: Collaboration with Parks Ranger, Devonne Doughty, DO regarding development and update of comprehensive plan of care as evidenced by provider attestation and co-signature Inter-disciplinary care team collaboration (see longitudinal plan of care) Medication review performed; medication list updated in electronic medical record.  Inter-disciplinary care team collaboration (see longitudinal plan of care) Referred to pharmacy team for assistance with HLD medication management Evaluation of current treatment  plan related to HLD  and patient's adherence to plan as established by provider. 09-06-2020: The patient denies any issues at this time. Is compliant with plan of care. Will have new blood work coming up in September of 2022. 10-29-2020: The patient is doing well. Labwork for HLD stable. Feels he is managing well. Will continue to monitor.  Advised patient to call the office for changes or questions Provided education to patient re: heart healthy/ADA diet, taking medications as prescribed and working with CCM team to optimize health and well being. Education on activity level and safety.  Discussed plans with patient for ongoing care management follow up and provided patient with direct contact information for care management team   Patient Goals/Self-Care Activities:  patient will:   - call for medicine refill 2 or 3 days before it runs out - call if I am sick and can't take my medicine - keep a list of all the medicines I take; vitamins and herbals too - learn to read medicine labels - use a pillbox to sort medicine - use an alarm clock or phone to remind me to take my medicine - change to whole grain breads, cereal, pasta - drink 6 to 8 glasses of water each day - eat 3 to 5 servings of fruits and vegetables each day - eat 5 or 6 small meals each day - fill half the plate with nonstarchy vegetables - limit fast food meals to no more than 1 per week - manage portion size - prepare main meal at  home 3 to 5 days each week - read food labels for fat, fiber, carbohydrates and portion size - be open to making changes - I can manage, know and watch for signs of a heart attack - if I have chest pain, call for help - learn about small changes that will make a big difference - learn my personal risk factors  - barriers to meeting goals identified - change-talk evoked - choices provided - decision-making supported - health risks reviewed - problem-solving facilitated - questions answered -  readiness for change evaluated - reassurance provided - self-reflection promoted - self-reliance encouraged  Follow Up Plan: Telephone follow up appointment with care management team member scheduled for: 12-24-2020 at 145 pm       Care Plan : RNCM: Depression (Adult) and Anxiety  Updates made by Vanita Ingles, RN since 12/24/2020 12:00 AM  Completed 12/24/2020   Problem: RNCM: Symptoms (Depression) and Anxiety Resolved 12/24/2020  Priority: High  Onset Date: 09/06/2020     Long-Range Goal: Symptoms Monitored and Managed Completed 12/24/2020  Start Date: 09/06/2020  Expected End Date: 09/06/2021  Recent Progress: On track  Priority: High  Note:    Current Barriers: Resolving, duplicate goal  Ineffective Self Health Maintenance in a patient with Anxiety and Depression Unable to independently manage reoccurrence of depression and anxiety Lacks social connections Clinical Goal(s):  Collaboration with Parks Ranger, Devonne Doughty, DO regarding development and update of comprehensive plan of care as evidenced by provider attestation and co-signature Inter-disciplinary care team collaboration (see longitudinal plan of care) patient will work with care management team to address care coordination and chronic disease management needs related to Disease Management Educational Needs Care Coordination Mental Health Counseling   Interventions:  Evaluation of current treatment plan related to Anxiety and Depression, Limited social support and Mental Health Concerns  self-management and patient's adherence to plan as established by provider. 10-29-2020: The patient states that he is doing a lot better with his depression and anxiety. He says sometimes he gets down and depressed. He is good about letting the pcp know when he is not feeling well or things change. Let the patient express his feelings. Will continue to monitor for changes and needs. The patient knows to call the pcp or RNCM for needs and  changes in mood, anxiety, and depression.  Collaboration with [name], DO regarding development and update of comprehensive plan of care as evidenced by provider attestation       and co-signature Inter-disciplinary care team collaboration (see longitudinal plan of care) Discussed plans with patient for ongoing care management follow up and provided patient with direct contact information for care management team Self Care Activities:  Patient verbalizes understanding of plan to effectively manage depression and anxiety Self administers medications as prescribed Attends all scheduled provider appointments Calls pharmacy for medication refills Attends church or other social activities Performs ADL's independently Performs IADL's independently Calls provider office for new concerns or questions Patient Goals: - activity or exercise based on tolerance encouraged - emotional support provided - healthy lifestyle promoted - medication side effects monitored and managed - pain managed - participation in mental health treatment encouraged - quality of sleep assessed - response to mental health treatment monitored - response to pharmacologic therapy monitored - sleep hygiene techniques encouraged - social activities and relationships encouraged Follow Up Plan: Telephone follow up appointment with care management team member scheduled for: 12-24-2020 at 1:45 pm    Care Plan : RNCM: General Plan of  Care (Adult) for Chronic Disease Management and Care Coordination Needs  Updates made by Vanita Ingles, RN since 12/24/2020 12:00 AM     Problem: RNCM: Development of Plan of Care for Chronic Disease Management (HTN, HLD, DM2, Depression, Anxiety)   Priority: High     Goal: RNCM: Effective Management of Plan of Care for Chronic Disease Management (HTN, HLD, DM, Depression, Anxiety)   Start Date: 12/24/2020  Expected End Date: 12/24/2021  Priority: High  Note:   Current  Barriers:  Knowledge Deficits related to plan of care for management of HTN, HLD, DMII, and Anxiety with Excessive Worry, and Depression: depressed mood anxiety  Care Coordination needs related to Limited social support, Mental Health Concerns , and Social Isolation  Chronic Disease Management support and education needs related to HTN, HLD, DMII, and Anxiety with Excessive Worry, and Depression: depressed mood anxiety  RNCM Clinical Goal(s):  Patient will verbalize basic understanding of HTN, HLD, DMII, Anxiety, and Depression disease process and self health management plan as evidenced by following the plan of care, taking medications, monitoring and following dietary restrictions and working with the CCM team to effectively manage health and well being take all medications exactly as prescribed and will call provider for medication related questions as evidenced by compliance and calling for refills before running out of medications     attend all scheduled medical appointments: 03-20-2021 as evidenced by keeping appointments and call to reschedule appointments if needed         continue to work with RN Care Manager and/or Social Worker to address care management and care coordination needs related to HTN, HLD, DMII, Anxiety, and Depression as evidenced by adherence to CM Team Scheduled appointments     demonstrate a decrease in HTN, HLD, DMII, Anxiety, and Depression exacerbations  as evidenced by working with the CCM team to optimize plan of care for chronic conditions demonstrate ongoing self health care management ability to effectively manage chronic conditions  as evidenced by working with the CCM team through collaboration with Consulting civil engineer, provider, and care team.   Interventions: 1:1 collaboration with primary care provider regarding development and update of comprehensive plan of care as evidenced by provider attestation and co-signature Inter-disciplinary care team collaboration  (see longitudinal plan of care) Evaluation of current treatment plan related to  self management and patient's adherence to plan as established by provider   Diabetes:  (Status: Goal on Track (progressing): YES.) Long Term Goal   Lab Results  Component Value Date   HGBA1C 7.2 (H) 10/12/2020  Assessed patient's understanding of A1c goal: <7% Provided education to patient about basic DM disease process; Reviewed medications with patient and discussed importance of medication adherence;        Reviewed prescribed diet with patient heart healthy/ADA; Counseled on importance of regular laboratory monitoring as prescribed;        Discussed plans with patient for ongoing care management follow up and provided patient with direct contact information for care management team;      Provided patient with written educational materials related to hypo and hyperglycemia and importance of correct treatment;       Reviewed scheduled/upcoming provider appointments including: 03-20-2021 at 0820 am;         call provider for findings outside established parameters;       Review of patient status, including review of consultants reports, relevant laboratory and other test results, and medications completed;        Anxiety and  Depression   (Status: Goal on Track (progressing): YES.) Long Term Goal  Evaluation of current treatment plan related to Anxiety and Depression, Limited social support, Mental Health Concerns , and Social Isolation self-management and patient's adherence to plan as established by provider. Discussed plans with patient for ongoing care management follow up and provided patient with direct contact information for care management team Advised patient to call the office for changes in mood, depression, and anxiety; Provided education to patient re: being around others who make him happy, talking to family and friends on the phone and when feeling down do activities that help him feel better.   12-24-2020: The patient states he and his daughter will be having Thanksgiving together. He was getting ready to take a nap. He states he is in a good place with his depression and anxiety at this time. Will continue to monitor for changes; Reviewed medications with patient and discussed compliance ; Reviewed scheduled/upcoming provider appointments including 03-20-2021 at [place] am; Social Work referral for ongoing support and education; Discussed plans with patient for ongoing care management follow up and provided patient with direct contact information for care management team; Screening for signs and symptoms of depression related to chronic disease state;  Assessed social determinant of health barriers;   Hyperlipidemia:  (Status: Goal on Track (progressing): YES.) Long Term Goal  Lab Results  Component Value Date   CHOL 127 10/12/2020   HDL 46 10/12/2020   LDLCALC 59 10/12/2020   TRIG 134 10/12/2020   CHOLHDL 2.8 10/12/2020     Medication review performed; medication list updated in electronic medical record.  Provider established cholesterol goals reviewed; Counseled on importance of regular laboratory monitoring as prescribed; Provided HLD educational materials; Reviewed role and benefits of statin for ASCVD risk reduction; Discussed strategies to manage statin-induced myalgias; Reviewed importance of limiting foods high in cholesterol;  Hypertension: (Status: Goal on Track (progressing): YES.) Last practice recorded BP readings:  BP Readings from Last 3 Encounters:  10/18/20 (!) 120/54  09/15/20 135/70  09/03/20 (!) 127/48  Most recent eGFR/CrCl:  Lab Results  Component Value Date   EGFR 38 (L) 10/12/2020    No components found for: CRCL  Evaluation of current treatment plan related to hypertension self management and patient's adherence to plan as established by provider;   Provided education to patient re: stroke prevention, s/s of heart attack and stroke; Reviewed  prescribed diet heart healthy/ADA diet  Reviewed medications with patient and discussed importance of compliance;  Discussed plans with patient for ongoing care management follow up and provided patient with direct contact information for care management team; Advised patient, providing education and rationale, to monitor blood pressure daily and record, calling PCP for findings outside established parameters;  Advised patient to discuss blood pressure trends, any onset of light headed or dizziness, new concerns with provider; Provided education on prescribed diet heart healthy/ADA diet ;  Discussed complications of poorly controlled blood pressure such as heart disease, stroke, circulatory complications, vision complications, kidney impairment, sexual dysfunction;   Patient Goals/Self-Care Activities: Take medications as prescribed   Attend all scheduled provider appointments Call pharmacy for medication refills 3-7 days in advance of running out of medications Attend church or other social activities Perform all self care activities independently  Perform IADL's (shopping, preparing meals, housekeeping, managing finances) independently Call provider office for new concerns or questions  Work with the social worker to address care coordination needs and will continue to work with the clinical team to  address health care and disease management related needs call 911 call the Suicide and Crisis Lifeline: 988 call the [place] National Suicide Prevention Lifeline: (406)604-6909 call 1-800-273-TALK (toll free, 24 hour hotline) if experiencing a Mental Health or [place]  keep appointment with eye doctor check feet daily for cuts, sores or redness trim toenails straight across drink 6 to 8 glasses of water each day eat fish at least once per week fill half of plate with vegetables limit fast food meals to no more than 1 per week manage portion size prepare main meal at home 3 to  5 days each week read food labels for fat, fiber, carbohydrates and portion size reduce red meat to 2 to 3 times a week keep feet up while sitting wash and dry feet carefully every day wear comfortable, cotton socks wear comfortable, well-fitting shoes check blood pressure weekly choose a place to take my blood pressure (home, clinic or office, retail store) write blood pressure results in a log or diary learn about high blood pressure keep a blood pressure log take blood pressure log to all doctor appointments call doctor for signs and symptoms of high blood pressure develop an action plan for high blood pressure keep all doctor appointments take medications for blood pressure exactly as prescribed report new symptoms to your doctor eat more whole grains, fruits and vegetables, lean meats and healthy fats - call for medicine refill 2 or 3 days before it runs out - take all medications exactly as prescribed - call doctor with any symptoms you believe are related to your medicine - call doctor when you experience any new symptoms - go to all doctor appointments as scheduled - adhere to prescribed diet: heart healthy/ADA diet        Plan:Telephone follow up appointment with care management team member scheduled for:  02-25-2021 at 1 pm  [place], MSN, [facility] Mobile: [phone]

## 2021-01-02 DIAGNOSIS — F3342 Major depressive disorder, recurrent, in full remission: Secondary | ICD-10-CM

## 2021-01-02 DIAGNOSIS — N183 Chronic kidney disease, stage 3 unspecified: Secondary | ICD-10-CM

## 2021-01-02 DIAGNOSIS — I10 Essential (primary) hypertension: Secondary | ICD-10-CM | POA: Diagnosis not present

## 2021-01-02 DIAGNOSIS — E785 Hyperlipidemia, unspecified: Secondary | ICD-10-CM

## 2021-01-02 DIAGNOSIS — E1140 Type 2 diabetes mellitus with diabetic neuropathy, unspecified: Secondary | ICD-10-CM

## 2021-01-02 DIAGNOSIS — E1169 Type 2 diabetes mellitus with other specified complication: Secondary | ICD-10-CM

## 2021-01-02 DIAGNOSIS — I129 Hypertensive chronic kidney disease with stage 1 through stage 4 chronic kidney disease, or unspecified chronic kidney disease: Secondary | ICD-10-CM

## 2021-01-07 ENCOUNTER — Ambulatory Visit: Payer: Self-pay | Admitting: *Deleted

## 2021-01-07 NOTE — Telephone Encounter (Signed)
Pt reports pain "At sternum" x 1 week. States "Feels like my ulcer pain." States has been taking "Sulfonate" for a while, "Doesn't seem to help much anymore, does help a little."  Also takes Omeprazole. States intermittent, "After eating." Denies pain radiates. Reports decreased appetite, nausea, no vomiting. States is staying hydrated. Appt made with Rollene Fare, first available, for Wednesday.  Care advise given, advised ED for worsening symptoms, fever, vomiting, chest pain. Pt verbalizes understanding. Protocol calls for Appt within 24 hrs. Advised pt would route to practice for PCPs review.

## 2021-01-07 NOTE — Telephone Encounter (Signed)
Reason for Disposition . Age > 60 years  Answer Assessment - Initial Assessment Questions 1. LOCATION: "Where does it hurt?"      Right below sternum 2. RADIATION: "Does the pain shoot anywhere else?" (e.g., chest, back)     No 3. ONSET: "When did the pain begin?" (Minutes, hours or days ago)      1 week ago 4. SUDDEN: "Gradual or sudden onset?"     Gradual 5. PATTERN "Does the pain come and go, or is it constant?"    - If constant: "Is it getting better, staying the same, or worsening?"      (Note: Constant means the pain never goes away completely; most serious pain is constant and it progresses)     - If intermittent: "How long does it last?" "Do you have pain now?"     (Note: Intermittent means the pain goes away completely between bouts)     Intermittent 6. SEVERITY: "How bad is the pain?"  (e.g., Scale 1-10; mild, moderate, or severe)    - MILD (1-3): doesn't interfere with normal activities, abdomen soft and not tender to touch     - MODERATE (4-7): interferes with normal activities or awakens from sleep, abdomen tender to touch     - SEVERE (8-10): excruciating pain, doubled over, unable to do any normal activities       6/10 7. RECURRENT SYMPTOM: "Have you ever had this type of stomach pain before?" If Yes, ask: "When was the last time?" and "What happened that time?"      Yes with ulcer 8. CAUSE: "What do you think is causing the stomach pain?"     Ulcer 9. RELIEVING/AGGRAVATING FACTORS: "What makes it better or worse?" (e.g., movement, antacids, bowel movement)     Taking Omeprazole, and :Sulfonate. Occurs after eating 10. OTHER SYMPTOMS: "Do you have any other symptoms?" (e.g., back pain, diarrhea, fever, urination pain, vomiting)       Nausea, no appetite  Protocols used: Abdominal Pain - Male-A-AH

## 2021-01-09 ENCOUNTER — Ambulatory Visit (INDEPENDENT_AMBULATORY_CARE_PROVIDER_SITE_OTHER): Payer: Medicare Other | Admitting: Internal Medicine

## 2021-01-09 ENCOUNTER — Other Ambulatory Visit: Payer: Self-pay

## 2021-01-09 ENCOUNTER — Encounter: Payer: Self-pay | Admitting: Internal Medicine

## 2021-01-09 VITALS — BP 106/49 | HR 79 | Temp 97.3°F | Resp 17 | Ht 69.0 in | Wt 188.2 lb

## 2021-01-09 DIAGNOSIS — R110 Nausea: Secondary | ICD-10-CM | POA: Diagnosis not present

## 2021-01-09 DIAGNOSIS — E663 Overweight: Secondary | ICD-10-CM | POA: Diagnosis not present

## 2021-01-09 DIAGNOSIS — R1013 Epigastric pain: Secondary | ICD-10-CM | POA: Diagnosis not present

## 2021-01-09 DIAGNOSIS — K2100 Gastro-esophageal reflux disease with esophagitis, without bleeding: Secondary | ICD-10-CM

## 2021-01-09 DIAGNOSIS — Z6827 Body mass index (BMI) 27.0-27.9, adult: Secondary | ICD-10-CM

## 2021-01-09 MED ORDER — SUCRALFATE 1 G PO TABS: 1.0000 g | ORAL_TABLET | Freq: Three times a day (TID) | ORAL | 0 refills | Status: DC

## 2021-01-09 NOTE — Patient Instructions (Signed)

## 2021-01-09 NOTE — Assessment & Plan Note (Signed)
Encouraged weight loss as this can help reduce reflux symptoms

## 2021-01-09 NOTE — Progress Notes (Signed)
Subjective:    Patient ID: Charles York, male    DOB: 07-Jun-1930, 87 y.o.   MRN: [id number]  HPI  Patient presents the clinic today with complaint of epigastric pain.  He reports this started 5 days ago.  He describes the pain as a burning sensation.  He reports associated nausea, which is a chronic issue for him.  He denies vomiting, diarrhea, constipation.  He reports he ran out of his Sulcrafate about 1 week ago.  When he runs out of this medication, he developed this pain.  He found an old bottle of Sulcrafate and started taking it and reports his epigastric pain resolved yesterday.  He is taking Omeprazole as well for reflux.  He reports he had an upper GI at [place] last year.  Review of Systems     Past Medical History:  Diagnosis Date   Anxiety    Arthritis    Cancer ([place])    skin   Chronic kidney disease    stage 3   Colon polyp    Diabetes mellitus without complication (HCC)    GERD (gastroesophageal reflux disease)    Heartburn    Hyperlipemia    Hypertension    Obesity     Current Outpatient Medications  Medication Sig Dispense Refill   aspirin 325 MG EC tablet TK 1 T PO QD FOR UP TO 90 DAYS THEN CAN REDUCE TO 81 MG DAILY     atorvastatin (LIPITOR) 40 MG tablet Take 1 tablet (40 mg total) by mouth at bedtime. 90 tablet 3   ferrous sulfate 325 (65 FE) MG tablet Take 325 mg by mouth every other day.     hydroxypropyl methylcellulose / hypromellose (ISOPTO TEARS / GONIOVISC) 2.5 % ophthalmic solution Place 1 drop into both eyes 4 (four) times daily.     insulin aspart protamine - aspart (NOVOLOG 70/30 MIX) (70-30) 100 UNIT/ML FlexPen Inject 12 Units into the skin 2 (two) times daily. In the morning and evening     ipratropium (ATROVENT) 0.06 % nasal spray USE 2 SPRAYS IN EACH NOSTRIL FOUR TIMES DAILY FOR UP TO 5 TO 7 DAYS THEN STOP 15 mL 2   LANTUS 100 UNIT/ML injection INJECT 27 UNITS SUBCUTANEOUSLY BEFORE BREAKFAST 10 mL 3   omeprazole (PRILOSEC) 20 MG capsule TAKE 1  CAPSULE BY MOUTH TWICE A DAY BEFORE A MEAL 180 capsule 1   senna (SENOKOT) 8.6 MG tablet Take 1 tablet by mouth as needed for constipation.     sucralfate (CARAFATE) 1 g tablet Take 1 tablet (1 g total) by mouth 4 (four) times daily. (Patient taking differently: Take 1 g by mouth 3 (three) times daily.) 60 tablet 0   polyethylene glycol powder (GLYCOLAX/MIRALAX) 17 GM/SCOOP powder Take 17-34 g by mouth 2 (two) times daily as needed for mild constipation or moderate constipation. (Patient not taking: Reported on 01/09/2021) 255 g 1   No current facility-administered medications for this visit.    Allergies  Allergen Reactions   Trulicity [Dulaglutide] Swelling    Swollen lips, shortness of breath,    Buspirone     Dizziness   Penicillins Itching and Rash    Has patient had a PCN reaction causing immediate rash, facial/tongue/throat swelling, SOB or lightheadedness with hypotension: No Has patient had a PCN reaction causing severe rash involving mucus membranes or skin necrosis: No Has patient had a PCN reaction that required hospitalization: No Has patient had a PCN reaction occurring within the last 10 years: No  If all of the above answers are "NO", then may proceed with Cephalosporin use.     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    Comment: retired  Tobacco Use   Smoking status: Former    Types: Cigarettes    Quit date: 02/04/1983    Years since quitting: 37.9   Smokeless tobacco: Former  Scientific laboratory technician Use: Never used  Substance and Sexual Activity   Alcohol use: No    Alcohol/week: 0.0 standard drinks   Drug use: No    Sexual activity: Not on file  Other Topics Concern   Not on file  Social History Narrative   Not on file   Social Determinants of Health   Financial Resource Strain: Low Risk    Difficulty of Paying Living Expenses: Not hard at all  Food Insecurity: No Food Insecurity   Worried About Charity fundraiser in the Last Year: Never true   Arboriculturist in the Last Year: Never true  Transportation Needs: No Transportation Needs   Lack of Transportation (Medical): No   Lack of Transportation (Non-Medical): No  Physical Activity: Insufficiently Active   Days of Exercise per Week: 4 days   Minutes of Exercise per Session: 30 min  Stress: No Stress Concern Present   Feeling of Stress : Not at all  Social Connections: Socially Isolated   Frequency of Communication with Friends and Family: More than three times a week   Frequency of Social Gatherings with Friends and Family: More than three times a week   Attends Religious Services: Never   Marine scientist or Organizations: No   Attends Archivist Meetings: Never   Marital Status: Widowed  Human resources officer Violence: Not At Risk   Fear of Current or Ex-Partner: No   Emotionally Abused: No   Physically Abused: No   Sexually Abused: No     Constitutional: Denies fever, malaise, fatigue, headache or abrupt weight changes.  Respiratory: Denies difficulty breathing, shortness of breath, cough or sputum production.   Cardiovascular: Denies chest pain, chest tightness, palpitations or swelling in the hands or feet.  Gastrointestinal: Patient reports epigastric pain.  Denies bloating, constipation, diarrhea or blood in the stool.  GU: Denies urgency, frequency, pain with urination, burning sensation, blood in urine, odor or discharge.  No other specific complaints in a complete review of systems (except as listed in HPI above).  Objective:   Physical Exam  BP (!) 106/49 (BP Location: Left Arm, Patient Position: Sitting,  Cuff Size: Normal)   Pulse 79   Temp (!) 97.3 F (36.3 C) (Temporal)   Resp 17   Ht 5\' 9"  (1.753 m)   Wt 188 lb 3.2 oz (85.4 kg)   SpO2 99%   BMI 27.79 kg/m   Wt Readings from Last 3 Encounters:  10/18/20 183 lb 3.2 oz (83.1 kg)  09/15/20 185 lb 6.5 oz (84.1 kg)  09/11/20 182 lb (82.6 kg)    General: Appears his stated age, overweight, in NAD. Cardiovascular: Normal  rate and rhythm. S1,S2 noted.  No murmur, rubs or gallops noted. Pulmonary/Chest: Normal effort and positive vesicular breath sounds. No respiratory distress. No wheezes, rales or ronchi noted.  Abdomen: Soft and nontender. Normal bowel sounds. No distention or masses noted.  Neurological: Alert and oriented.  BMET    Component Value Date/Time   NA 135 10/12/2020 0819   NA 139 10/28/2017 1051   K 5.4 (H) 10/12/2020 0819   CL 104 10/12/2020 0819   CO2 23 10/12/2020 0819   GLUCOSE 253 (H) 10/12/2020 0819   BUN 33 (H) 10/12/2020 0819   BUN 24 10/28/2017 1051   CREATININE 1.71 (H) 10/12/2020 0819   CALCIUM 9.2 10/12/2020 0819   GFRNONAA 42 (L) 09/15/2020 0740   GFRNONAA 45 (L) 09/12/2019 0757   GFRAA 57 (L) 10/05/2019 1737   GFRAA 52 (L) 09/12/2019 0757    Lipid Panel     Component Value Date/Time   CHOL 127 10/12/2020 0819   TRIG 134 10/12/2020 0819   HDL 46 10/12/2020 0819   CHOLHDL 2.8 10/12/2020 0819   LDLCALC 59 10/12/2020 0819    CBC    Component Value Date/Time   WBC 9.5 10/12/2020 0819   RBC 3.81 (L) 10/12/2020 0819   HGB 12.4 (L) 10/12/2020 0819   HGB 13.2 10/28/2017 1051   HCT 38.1 (L) 10/12/2020 0819   HCT 39.6 10/28/2017 1051   PLT 242 10/12/2020 0819   PLT 248 10/28/2017 1051   MCV 100.0 10/12/2020 0819   MCV 95 10/28/2017 1051   MCH 32.5 10/12/2020 0819   MCHC 32.5 10/12/2020 0819   RDW 12.4 10/12/2020 0819   RDW 12.1 (L) 10/28/2017 1051   LYMPHSABS 4,237 (H) 10/12/2020 0819   MONOABS 0.5 09/15/2020 0740   EOSABS 181 10/12/2020 0819   BASOSABS 48 10/12/2020 0819    Hgb  A1C Lab Results  Component Value Date   HGBA1C 7.2 (H) 10/12/2020          Assessment & Plan:   Epigastric Pain, GERD:  Symptoms have resolved He will continue Omeprazole as previously prescribed Sulcrafate refilled today  Return precautions discussed  Webb Silversmith, NP This visit occurred during the SARS-CoV-2 public health emergency.  Safety protocols were in place, including screening questions prior to the visit, additional usage of staff PPE, and extensive cleaning of exam room while observing appropriate contact time as indicated for disinfecting solutions.

## 2021-01-30 ENCOUNTER — Ambulatory Visit: Payer: Self-pay | Admitting: *Deleted

## 2021-01-30 NOTE — Telephone Encounter (Signed)
°  Chief Complaint: sharp shooting pains from front of right ear up into his head intermittently 8/10 on pain scale. Symptoms: The pain kept him up all night and is happening now. Frequency: intermittently Pertinent Negatives: Patient denies dizziness, confusion, previous stroke, no migraine hx.  Never had this before.   Disposition: [x] ED /[] Urgent Care (no appt availability in office) / [] Appointment(In office/virtual)/ []  [facility]/ [] Home Care/ [] Refused Recommended Disposition  Additional Notes: I have referred him to the ED per the protocol.   Triage notes sent to Dr. Parks Ranger at [facility].

## 2021-01-30 NOTE — Telephone Encounter (Signed)
Reason for Disposition  [1] SEVERE headache AND [2] sudden-onset (i.e., reaching maximum intensity within seconds to 1 hour)  Answer Assessment - Initial Assessment Questions 1. LOCATION: "Where does it hurt?"      My right area in front of my ear shoots pain up into my head and is keeping me up at night.   It happens every 20 seconds or so and lasted all night.  It kept me up. No confusion or dizziness 2. ONSET: "When did the headache start?" (Minutes, hours or days)      Yesterday about 5-6:00 PM. 3. PATTERN: "Does the pain come and go, or has it been constant since it started?"     Intermittent shooting pains. 4. SEVERITY: "How bad is the pain?" and "What does it keep you from doing?"  (e.g., Scale 1-10; mild, moderate, or severe)   - MILD (1-3): doesn't interfere with normal activities    - MODERATE (4-7): interferes with normal activities or awakens from sleep    - SEVERE (8-10): excruciating pain, unable to do any normal activities        *No Answer* 5. RECURRENT SYMPTOM: "Have you ever had headaches before?" If Yes, ask: "When was the last time?" and "What happened that time?"      No migraine history. 6. CAUSE: "What do you think is causing the headache?"     *No Answer* 7. MIGRAINE: "Have you been diagnosed with migraine headaches?" If Yes, ask: "Is this headache similar?"      No 8. HEAD INJURY: "Has there been any recent injury to the head?"      No injuries or accidents    No recent illnesses 9. OTHER SYMPTOMS: "Do you have any other symptoms?" (fever, stiff neck, eye pain, sore throat, cold symptoms)     No 10. PREGNANCY: "Is there any chance you are pregnant?" "When was your last menstrual period?"       N/A  Protocols used: Headache-A-AH

## 2021-01-30 NOTE — Telephone Encounter (Signed)
Noted   Pt informed to go to ED.  KP

## 2021-02-14 ENCOUNTER — Other Ambulatory Visit: Payer: Self-pay | Admitting: Family Medicine

## 2021-02-14 NOTE — Telephone Encounter (Signed)
Requested medication (s) are due for refill today:   Not sure  Requested medication (s) are on the active medication list:   No  Future visit scheduled:   Yes   Last ordered:  This medication is not listed in his medications.   Dr. Parks Ranger did address continuing the losartan 25 mg in his OV note 10/18/2020 to continue taking it.  Do not see where it has been discontinued.    Requested Prescriptions  Pending Prescriptions Disp Refills   losartan (COZAAR) 25 MG tablet [Pharmacy Med Name: LOSARTAN POTASSIUM 25 MG TAB] 90 tablet 2    Sig: TAKE 1 TABLET BY MOUTH EVERY DAY     Cardiovascular:  Angiotensin Receptor Blockers Failed - 02/14/2021  8:23 AM      Failed - Cr in normal range and within 180 days    Creat  Date Value Ref Range Status  10/12/2020 1.71 (H) 0.70 - 1.22 mg/dL Final          Failed - K in normal range and within 180 days    Potassium  Date Value Ref Range Status  10/12/2020 5.4 (H) 3.5 - 5.3 mmol/L Final          Failed - Last BP in normal range    BP Readings from Last 1 Encounters:  01/09/21 (!) 106/49          Passed - Patient is not pregnant      Passed - Valid encounter within last 6 months    Recent Outpatient Visits           1 month ago Epigastric pain   [facility] [place], Coralie Keens, NP   3 months ago Annual physical exam   [facility] [place], Devonne Doughty, DO   5 months ago Major depressive disorder, recurrent, moderate ([place])   [place], DO   11 months ago Entrapment of right ulnar nerve   [place], DO   1 year ago Major depressive disorder, recurrent, moderate ([place])   [facility] Parks Ranger, Devonne Doughty, DO       Future Appointments             In 1 month Parks Ranger, [facility], [place]   In 7 months  [facility], [facility]

## 2021-02-19 ENCOUNTER — Other Ambulatory Visit: Payer: Self-pay | Admitting: Family Medicine

## 2021-02-19 ENCOUNTER — Ambulatory Visit: Payer: Self-pay | Admitting: *Deleted

## 2021-02-19 NOTE — Telephone Encounter (Signed)
Med discontinued. Not on current med profile.  Requested Prescriptions  Pending Prescriptions Disp Refills   losartan (COZAAR) 25 MG tablet [Pharmacy Med Name: LOSARTAN POTASSIUM 25 MG TAB] 90 tablet 2    Sig: TAKE 1 TABLET BY MOUTH EVERY DAY     Cardiovascular:  Angiotensin Receptor Blockers Failed - 02/19/2021  2:43 PM      Failed - Cr in normal range and within 180 days    Creat  Date Value Ref Range Status  10/12/2020 1.71 (H) 0.70 - 1.22 mg/dL Final         Failed - K in normal range and within 180 days    Potassium  Date Value Ref Range Status  10/12/2020 5.4 (H) 3.5 - 5.3 mmol/L Final         Failed - Last BP in normal range    BP Readings from Last 1 Encounters:  01/09/21 (!) 106/49         Passed - Patient is not pregnant      Passed - Valid encounter within last 6 months    Recent Outpatient Visits          1 month ago Epigastric pain   [facility] [place], Coralie Keens, NP   4 months ago Annual physical exam   [facility] [place], Devonne Doughty, DO   5 months ago Major depressive disorder, recurrent, moderate ([place])   [place], DO   12 months ago Entrapment of right ulnar nerve   [place], DO   1 year ago Major depressive disorder, recurrent, moderate ([place])   [facility] Parks Ranger, Devonne Doughty, DO      Future Appointments            In 4 weeks Parks Ranger, Devonne Doughty, DO [facility], [place]   In 7 months  [facility], [facility]

## 2021-02-19 NOTE — Telephone Encounter (Signed)
Summary: medication question   Andrina from [facility] called in to confirm if patient should stll be taking Losartan and if so can it be sent to the pharmacy, [id number] [place], [address]  Phone: [phone]  Fax: [phone]      Return call to clinical pharmacy representative. Per chart note 10/18/20 - medication discontinued due to SE. They will contact the patient- he may not remember that medication was discontinued. Advised to have patient call the office if he has questions. Reason for Disposition  Pharmacy calling with prescription question and triager answers question  Answer Assessment - Initial Assessment Questions 1. NAME of MEDICATION: "What medicine are you calling about?"     Pharmacy calling about a RF request 2. QUESTION: "What is your question?" (e.g., double dose of medicine, side effect)     Losartan 3. PRESCRIBING HCP: "Who prescribed it?" Reason: if prescribed by specialist, call should be referred to that group.     PCP  Call to pharmacy- advised medication was discontinued per chart notes 10/18/20 due to side effects  Protocols used: Medication Question Call-A-AH

## 2021-02-25 ENCOUNTER — Telehealth: Payer: Medicare Other

## 2021-02-25 ENCOUNTER — Ambulatory Visit (INDEPENDENT_AMBULATORY_CARE_PROVIDER_SITE_OTHER): Payer: Medicare Other

## 2021-02-25 DIAGNOSIS — E1169 Type 2 diabetes mellitus with other specified complication: Secondary | ICD-10-CM

## 2021-02-25 DIAGNOSIS — I129 Hypertensive chronic kidney disease with stage 1 through stage 4 chronic kidney disease, or unspecified chronic kidney disease: Secondary | ICD-10-CM

## 2021-02-25 DIAGNOSIS — F331 Major depressive disorder, recurrent, moderate: Secondary | ICD-10-CM

## 2021-02-25 DIAGNOSIS — E785 Hyperlipidemia, unspecified: Secondary | ICD-10-CM

## 2021-02-25 DIAGNOSIS — E7800 Pure hypercholesterolemia, unspecified: Secondary | ICD-10-CM

## 2021-02-25 DIAGNOSIS — F411 Generalized anxiety disorder: Secondary | ICD-10-CM

## 2021-02-25 DIAGNOSIS — K2100 Gastro-esophageal reflux disease with esophagitis, without bleeding: Secondary | ICD-10-CM

## 2021-02-25 DIAGNOSIS — R1013 Epigastric pain: Secondary | ICD-10-CM

## 2021-02-25 DIAGNOSIS — N183 Chronic kidney disease, stage 3 unspecified: Secondary | ICD-10-CM

## 2021-02-25 DIAGNOSIS — F3342 Major depressive disorder, recurrent, in full remission: Secondary | ICD-10-CM

## 2021-02-25 DIAGNOSIS — E1140 Type 2 diabetes mellitus with diabetic neuropathy, unspecified: Secondary | ICD-10-CM

## 2021-02-25 DIAGNOSIS — I10 Essential (primary) hypertension: Secondary | ICD-10-CM

## 2021-02-25 DIAGNOSIS — M1990 Unspecified osteoarthritis, unspecified site: Secondary | ICD-10-CM

## 2021-02-25 DIAGNOSIS — K219 Gastro-esophageal reflux disease without esophagitis: Secondary | ICD-10-CM

## 2021-02-25 DIAGNOSIS — F419 Anxiety disorder, unspecified: Secondary | ICD-10-CM

## 2021-02-25 NOTE — Chronic Care Management (AMB) (Signed)
Chronic Care Management   CCM RN Visit Note  02/25/2021 Name: Charles York MRN: [id number] DOB: [birth year]  Subjective: Charles York is a 88 y.o. year old male who is a primary care patient of [name], DO. The care management team was consulted for assistance with disease management and care coordination needs.    Engaged with patient by telephone for follow up visit in response to provider referral for case management and/or care coordination services.   Consent to Services:  The patient was given information about Chronic Care Management services, agreed to services, and gave verbal consent prior to initiation of services.  Please see initial visit note for detailed documentation.   Patient agreed to services and verbal consent obtained.   Assessment: Review of patient past medical history, allergies, medications, health status, including review of consultants reports, laboratory and other test data, was performed as part of comprehensive evaluation and provision of chronic care management services.   SDOH (Social Determinants of Health) assessments and interventions performed:    CCM Care Plan  Allergies  Allergen Reactions   Trulicity [Dulaglutide] Swelling    Swollen lips, shortness of breath,    Buspirone     Dizziness   Penicillins Itching and Rash    Has patient had a PCN reaction causing immediate rash, facial/tongue/throat swelling, SOB or lightheadedness with hypotension: No Has patient had a PCN reaction causing severe rash involving mucus membranes or skin necrosis: No Has patient had a PCN reaction that required hospitalization: No Has patient had a PCN reaction occurring within the last 10 years: No If all of the above answers are "NO", then may proceed with Cephalosporin use.     Outpatient Encounter Medications as of 02/25/2021  Medication Sig Note   aspirin 325 MG EC tablet TK 1 T PO QD FOR UP TO 90 DAYS THEN CAN REDUCE TO 81 MG DAILY     atorvastatin (LIPITOR) 40 MG tablet Take 1 tablet (40 mg total) by mouth at bedtime.    ferrous sulfate 325 (65 FE) MG tablet Take 325 mg by mouth every other day.    hydroxypropyl methylcellulose / hypromellose (ISOPTO TEARS / GONIOVISC) 2.5 % ophthalmic solution Place 1 drop into both eyes 4 (four) times daily.    insulin aspart protamine - aspart (NOVOLOG 70/30 MIX) (70-30) 100 UNIT/ML FlexPen Inject 12 Units into the skin 2 (two) times daily. In the morning and evening    ipratropium (ATROVENT) 0.06 % nasal spray USE 2 SPRAYS IN EACH NOSTRIL FOUR TIMES DAILY FOR UP TO 5 TO 7 DAYS THEN STOP 12/29/2018: Dropped to twice daily due to excessive dryness.   LANTUS 100 UNIT/ML injection INJECT 27 UNITS SUBCUTANEOUSLY BEFORE BREAKFAST    omeprazole (PRILOSEC) 20 MG capsule TAKE 1 CAPSULE BY MOUTH TWICE A DAY BEFORE A MEAL    polyethylene glycol powder (GLYCOLAX/MIRALAX) 17 GM/SCOOP powder Take 17-34 g by mouth 2 (two) times daily as needed for mild constipation or moderate constipation. (Patient not taking: Reported on 01/09/2021)    senna (SENOKOT) 8.6 MG tablet Take 1 tablet by mouth as needed for constipation.    sucralfate (CARAFATE) 1 g tablet Take 1 tablet (1 g total) by mouth 3 (three) times daily.    No facility-administered encounter medications on file as of 02/25/2021.    Patient Active Problem List   Diagnosis Date Noted   Overweight with body mass index (BMI) of 27 to 27.9 in adult 01/09/2021   Major depression, recurrent, full  remission ([place]) 10/12/2019   GAD (generalized anxiety disorder) 10/12/2019   Chronic right shoulder pain 08/17/2018   Absolute anemia 07/30/2018   Primary osteoarthritis involving multiple joints 06/15/2018   GERD (gastroesophageal reflux disease) 11/18/2016   Diabetic retinopathy ([place]) 11/18/2016   Hyperlipidemia associated with type 2 diabetes mellitus ([place]) 11/18/2016   Daytime sleepiness 07/15/2016   Fatigue 07/15/2016   Presbycusis of both ears  07/15/2016   Tinnitus of right ear 07/15/2016   Allergic rhinitis due to allergen 06/09/2016   CKD (chronic kidney disease), stage III ([place]) 02/27/2016   BPPV (benign paroxysmal positional vertigo), left 02/27/2016   Chronic pansinusitis 02/27/2016   Arthritis 07/03/2015   Colon polyp 07/03/2015   Benign hypertension with CKD (chronic kidney disease) stage III ([place]) 07/03/2015   High cholesterol 07/03/2015   Type 2 diabetes, controlled, with neuropathy ([place]) 07/03/2015    Conditions to be addressed/monitored:HTN, HLD, DMII, Anxiety, Depression, and GERD  Care Plan : RNCM: General Plan of Care (Adult) for Chronic Disease Management and Care Coordination Needs  Updates made by Vanita Ingles, RN since 02/25/2021 12:00 AM     Problem: RNCM: Development of Plan of Care for Chronic Disease Management (HTN, HLD, DM2, Depression, Anxiety)   Priority: High     Goal: RNCM: Effective Management of Plan of Care for Chronic Disease Management (HTN, HLD, DM, Depression, Anxiety)   Start Date: 12/24/2020  Expected End Date: 12/24/2021  Priority: High  Note:   Current Barriers:  Knowledge Deficits related to plan of care for management of HTN, HLD, DMII, and Anxiety with Excessive Worry, and Depression: depressed mood anxiety  Care Coordination needs related to Limited social support, Mental Health Concerns , and Social Isolation  Chronic Disease Management support and education needs related to HTN, HLD, DMII, and Anxiety with Excessive Worry, and Depression: depressed mood anxiety  RNCM Clinical Goal(s):  Patient will verbalize basic understanding of HTN, HLD, DMII, Anxiety, and Depression disease process and self health management plan as evidenced by following the plan of care, taking medications, monitoring and following dietary restrictions and working with the CCM team to effectively manage health and well being take all medications exactly as prescribed and will call provider for  medication related questions as evidenced by compliance and calling for refills before running out of medications     attend all scheduled medical appointments: 03-20-2021 as evidenced by keeping appointments and call to reschedule appointments if needed         continue to work with RN Care Manager and/or Social Worker to address care management and care coordination needs related to HTN, HLD, DMII, Anxiety, and Depression as evidenced by adherence to CM Team Scheduled appointments     demonstrate a decrease in HTN, HLD, DMII, Anxiety, and Depression exacerbations  as evidenced by working with the CCM team to optimize plan of care for chronic conditions demonstrate ongoing self health care management ability to effectively manage chronic conditions  as evidenced by working with the CCM team through collaboration with Consulting civil engineer, provider, and care team.   Interventions: 1:1 collaboration with primary care provider regarding development and update of comprehensive plan of care as evidenced by provider attestation and co-signature Inter-disciplinary care team collaboration (see longitudinal plan of care) Evaluation of current treatment plan related to  self management and patient's adherence to plan as established by provider   Diabetes:  (Status: Goal on Track (progressing): YES.) Long Term Goal   Lab Results  Component Value Date  HGBA1C 7.2 (H) 10/12/2020  Assessed patient's understanding of A1c goal: <7% Provided education to patient about basic DM disease process; Reviewed medications with patient and discussed importance of medication adherence;        Reviewed prescribed diet with patient heart healthy/ADA. 02-25-2021: The patient is compliant with heart healthy/ADA diet; Counseled on importance of regular laboratory monitoring as prescribe. 02-25-2021: Has regular labwork;        Discussed plans with patient for ongoing care management follow up and provided patient with direct  contact information for care management team;      Provided patient with written educational materials related to hypo and hyperglycemia and importance of correct treatment. 02-25-2021: Denies any sx and sx of hypoglycemia, the patient states he is having better blood sugars since getting his exercise bike. He does it every morning for at least 20 minutes.       Reviewed scheduled/upcoming provider appointments including: 03-20-2021 at 0820 am;         call provider for findings outside established parameters;       Review of patient status, including review of consultants reports, relevant laboratory and other test results, and medications completed;        Anxiety and Depression   (Status: Goal on Track (progressing): YES.) Long Term Goal  Evaluation of current treatment plan related to Anxiety and Depression, Limited social support, Mental Health Concerns , and Social Isolation self-management and patient's adherence to plan as established by provider. 02-25-2021: The patient is doing well. Denies any new concerns. States that his epigastric pain is gone now that he is taking the medications and saw pcp in December for GERD. Denies any acute distress or mental health issues at this time. Will continue to monitor for changes.  Discussed plans with patient for ongoing care management follow up and provided patient with direct contact information for care management team Advised patient to call the office for changes in mood, depression, and anxiety; Provided education to patient re: being around others who make him happy, talking to family and friends on the phone and when feeling down do activities that help him feel better.  12-24-2020: The patient states he and his daughter will be having Thanksgiving together. He was getting ready to take a nap. He states he is in a good place with his depression and anxiety at this time. Will continue to monitor for changes. 02-25-2021: Is doing well and denies any acute  findings. States his conditions are stable.  Reviewed medications with patient and discussed compliance ; Reviewed scheduled/upcoming provider appointments including 03-20-2021 at [place] am; Social Work referral for ongoing support and education; Discussed plans with patient for ongoing care management follow up and provided patient with direct contact information for care management team; Screening for signs and symptoms of depression related to chronic disease state;  Assessed social determinant of health barriers;   Hyperlipidemia:  (Status: Goal on Track (progressing): YES.) Long Term Goal  Lab Results  Component Value Date   CHOL 127 10/12/2020   HDL 46 10/12/2020   LDLCALC 59 10/12/2020   TRIG 134 10/12/2020   CHOLHDL 2.8 10/12/2020     Medication review performed; medication list updated in electronic medical record.  Provider established cholesterol goals reviewed. 02-25-2021: The patient is at goal with his readings. Praised for effective management; Counseled on importance of regular laboratory monitoring as prescribed; Provided HLD educational materials; Reviewed role and benefits of statin for ASCVD risk reduction; Discussed strategies to manage statin-induced  myalgias; Reviewed importance of limiting foods high in cholesterol. 02-25-2021: Is compliant with heart healthy/Ada diet   Hypertension: (Status: Goal on Track (progressing): YES.) Last practice recorded BP readings:  BP Readings from Last 3 Encounters:  01/09/21 (!) 106/49  10/18/20 (!) 120/54  09/15/20 135/70  Most recent eGFR/CrCl:  Lab Results  Component Value Date   EGFR 38 (L) 10/12/2020    No components found for: CRCL  Evaluation of current treatment plan related to hypertension self management and patient's adherence to plan as established by provider. 02-25-2021: Denies any issues with HTN or Heart healthy. Blood pressures are stable. He denies any acute distress. Will continue to monitor for changes. ;    Provided education to patient re: stroke prevention, s/s of heart attack and stroke; Reviewed prescribed diet heart healthy/ADA diet  Reviewed medications with patient and discussed importance of compliance. 02-25-2021: Is compliant with medications. Denies any medication needs.  Discussed plans with patient for ongoing care management follow up and provided patient with direct contact information for care management team; Advised patient, providing education and rationale, to monitor blood pressure daily and record, calling PCP for findings outside established parameters;  Advised patient to discuss blood pressure trends, any onset of light headed or dizziness, new concerns with provider; Provided education on prescribed diet heart healthy/ADA diet ;  Discussed complications of poorly controlled blood pressure such as heart disease, stroke, circulatory complications, vision complications, kidney impairment, sexual dysfunction;   Patient Goals/Self-Care Activities: Take medications as prescribed   Attend all scheduled provider appointments Call pharmacy for medication refills 3-7 days in advance of running out of medications Attend church or other social activities Perform all self care activities independently  Perform IADL's (shopping, preparing meals, housekeeping, managing finances) independently Call provider office for new concerns or questions  Work with the social worker to address care coordination needs and will continue to work with the clinical team to address health care and disease management related needs call 911 call the Suicide and Crisis Lifeline: 988 call the [place] National Suicide Prevention Lifeline: 513-859-6016 call 1-800-273-TALK (toll free, 24 hour hotline) if experiencing a Mental Health or [place]  keep appointment with eye doctor check feet daily for cuts, sores or redness trim toenails straight across drink 6 to 8 glasses of water each day eat  fish at least once per week fill half of plate with vegetables limit fast food meals to no more than 1 per week manage portion size prepare main meal at home 3 to 5 days each week read food labels for fat, fiber, carbohydrates and portion size reduce red meat to 2 to 3 times a week keep feet up while sitting wash and dry feet carefully every day wear comfortable, cotton socks wear comfortable, well-fitting shoes check blood pressure weekly choose a place to take my blood pressure (home, clinic or office, retail store) write blood pressure results in a log or diary learn about high blood pressure keep a blood pressure log take blood pressure log to all doctor appointments call doctor for signs and symptoms of high blood pressure develop an action plan for high blood pressure keep all doctor appointments take medications for blood pressure exactly as prescribed report new symptoms to your doctor eat more whole grains, fruits and vegetables, lean meats and healthy fats - call for medicine refill 2 or 3 days before it runs out - take all medications exactly as prescribed - call doctor with any symptoms you believe are  related to your medicine - call doctor when you experience any new symptoms - go to all doctor appointments as scheduled - adhere to prescribed diet: heart healthy/ADA diet        Plan:Telephone follow up appointment with care management team member scheduled for:  04-22-2021 at 1 pm  [place], MSN, [employer] Mobile: [phone]

## 2021-02-25 NOTE — Patient Instructions (Signed)
Visit Information  Thank you for taking time to visit with me today. Please don't hesitate to contact me if I can be of assistance to you before our next scheduled telephone appointment.  Following are the goals we discussed today:   RNCM Clinical Goal(s):  Patient will verbalize basic understanding of HTN, HLD, DMII, Anxiety, and Depression disease process and self health management plan as evidenced by following the plan of care, taking medications, monitoring and following dietary restrictions and working with the CCM team to effectively manage health and well being take all medications exactly as prescribed and will call provider for medication related questions as evidenced by compliance and calling for refills before running out of medications     attend all scheduled medical appointments: 03-20-2021 as evidenced by keeping appointments and call to reschedule appointments if needed         continue to work with [place] and/or Social Worker to address care management and care coordination needs related to HTN, HLD, DMII, Anxiety, and Depression as evidenced by adherence to CM Team Scheduled appointments     demonstrate a decrease in HTN, HLD, DMII, Anxiety, and Depression exacerbations  as evidenced by working with the CCM team to optimize plan of care for chronic conditions demonstrate ongoing self health care management ability to effectively manage chronic conditions  as evidenced by working with the CCM team through collaboration with Consulting civil engineer, provider, and care team.    Interventions: 1:1 collaboration with primary care provider regarding development and update of comprehensive plan of care as evidenced by provider attestation and co-signature Inter-disciplinary care team collaboration (see longitudinal plan of care) Evaluation of current treatment plan related to  self management and patient's adherence to plan as established by provider     Diabetes:  (Status: Goal on  Track (progressing): YES.) Long Term Goal         Lab Results  Component Value Date    HGBA1C 7.2 (H) 10/12/2020  Assessed patient's understanding of A1c goal: <7% Provided education to patient about basic DM disease process; Reviewed medications with patient and discussed importance of medication adherence;        Reviewed prescribed diet with patient heart healthy/ADA. 02-25-2021: The patient is compliant with heart healthy/ADA diet; Counseled on importance of regular laboratory monitoring as prescribe. 02-25-2021: Has regular labwork;        Discussed plans with patient for ongoing care management follow up and provided patient with direct contact information for care management team;      Provided patient with written educational materials related to hypo and hyperglycemia and importance of correct treatment. 02-25-2021: Denies any sx and sx of hypoglycemia, the patient states he is having better blood sugars since getting his exercise bike. He does it every morning for at least 20 minutes.       Reviewed scheduled/upcoming provider appointments including: 03-20-2021 at 0820 am;         call provider for findings outside established parameters;       Review of patient status, including review of consultants reports, relevant laboratory and other test results, and medications completed;         Anxiety and Depression   (Status: Goal on Track (progressing): YES.) Long Term Goal  Evaluation of current treatment plan related to Anxiety and Depression, Limited social support, Mental Health Concerns , and Social Isolation self-management and patient's adherence to plan as established by provider. 02-25-2021: The patient is doing well. Denies any new  concerns. States that his epigastric pain is gone now that he is taking the medications and saw pcp in December for GERD. Denies any acute distress or mental health issues at this time. Will continue to monitor for changes.  Discussed plans with patient for  ongoing care management follow up and provided patient with direct contact information for care management team Advised patient to call the office for changes in mood, depression, and anxiety; Provided education to patient re: being around others who make him happy, talking to family and friends on the phone and when feeling down do activities that help him feel better.  12-24-2020: The patient states he and his daughter will be having Thanksgiving together. He was getting ready to take a nap. He states he is in a good place with his depression and anxiety at this time. Will continue to monitor for changes. 02-25-2021: Is doing well and denies any acute findings. States his conditions are stable.  Reviewed medications with patient and discussed compliance ; Reviewed scheduled/upcoming provider appointments including 03-20-2021 at [place] am; Social Work referral for ongoing support and education; Discussed plans with patient for ongoing care management follow up and provided patient with direct contact information for care management team; Screening for signs and symptoms of depression related to chronic disease state;  Assessed social determinant of health barriers;    Hyperlipidemia:  (Status: Goal on Track (progressing): YES.) Long Term Goal       Lab Results  Component Value Date    CHOL 127 10/12/2020    HDL 46 10/12/2020    LDLCALC 59 10/12/2020    TRIG 134 10/12/2020    CHOLHDL 2.8 10/12/2020      Medication review performed; medication list updated in electronic medical record.  Provider established cholesterol goals reviewed. 02-25-2021: The patient is at goal with his readings. Praised for effective management; Counseled on importance of regular laboratory monitoring as prescribed; Provided HLD educational materials; Reviewed role and benefits of statin for ASCVD risk reduction; Discussed strategies to manage statin-induced myalgias; Reviewed importance of limiting foods high in  cholesterol. 02-25-2021: Is compliant with heart healthy/Ada diet    Hypertension: (Status: Goal on Track (progressing): YES.) Last practice recorded BP readings:     BP Readings from Last 3 Encounters:  01/09/21 (!) 106/49  10/18/20 (!) 120/54  09/15/20 135/70  Most recent eGFR/CrCl:       Lab Results  Component Value Date    EGFR 38 (L) 10/12/2020    No components found for: CRCL   Evaluation of current treatment plan related to hypertension self management and patient's adherence to plan as established by provider. 02-25-2021: Denies any issues with HTN or Heart healthy. Blood pressures are stable. He denies any acute distress. Will continue to monitor for changes. ;   Provided education to patient re: stroke prevention, s/s of heart attack and stroke; Reviewed prescribed diet heart healthy/ADA diet  Reviewed medications with patient and discussed importance of compliance. 02-25-2021: Is compliant with medications. Denies any medication needs.  Discussed plans with patient for ongoing care management follow up and provided patient with direct contact information for care management team; Advised patient, providing education and rationale, to monitor blood pressure daily and record, calling PCP for findings outside established parameters;  Advised patient to discuss blood pressure trends, any onset of light headed or dizziness, new concerns with provider; Provided education on prescribed diet heart healthy/ADA diet ;  Discussed complications of poorly controlled blood pressure such as heart disease,  stroke, circulatory complications, vision complications, kidney impairment, sexual dysfunction;    Patient Goals/Self-Care Activities: Take medications as prescribed   Attend all scheduled provider appointments Call pharmacy for medication refills 3-7 days in advance of running out of medications Attend church or other social activities Perform all self care activities independently   Perform IADL's (shopping, preparing meals, housekeeping, managing finances) independently Call provider office for new concerns or questions  Work with the social worker to address care coordination needs and will continue to work with the clinical team to address health care and disease management related needs call 911 call the Suicide and Crisis Lifeline: 988 call the [place] National Suicide Prevention Lifeline: 915-354-8602 call 1-800-273-TALK (toll free, 24 hour hotline) if experiencing a Mental Health or [place]  keep appointment with eye doctor check feet daily for cuts, sores or redness trim toenails straight across drink 6 to 8 glasses of water each day eat fish at least once per week fill half of plate with vegetables limit fast food meals to no more than 1 per week manage portion size prepare main meal at home 3 to 5 days each week read food labels for fat, fiber, carbohydrates and portion size reduce red meat to 2 to 3 times a week keep feet up while sitting wash and dry feet carefully every day wear comfortable, cotton socks wear comfortable, well-fitting shoes check blood pressure weekly choose a place to take my blood pressure (home, clinic or office, retail store) write blood pressure results in a log or diary learn about high blood pressure keep a blood pressure log take blood pressure log to all doctor appointments call doctor for signs and symptoms of high blood pressure develop an action plan for high blood pressure keep all doctor appointments take medications for blood pressure exactly as prescribed report new symptoms to your doctor eat more whole grains, fruits and vegetables, lean meats and healthy fats - call for medicine refill 2 or 3 days before it runs out - take all medications exactly as prescribed - call doctor with any symptoms you believe are related to your medicine - call doctor when you experience any new symptoms - go to  all doctor appointments as scheduled - adhere to prescribed diet: heart healthy/ADA diet    Our next appointment is by telephone on 04-22-2021 at 1 pm  Please call the care guide team at [phone] if you need to cancel or reschedule your appointment.   If you are experiencing a Mental Health or [place] or need someone to talk to, please call the Suicide and Crisis Lifeline: 988 call the [place] National Suicide Prevention Lifeline: (442)533-1958 or TTY: 240-808-1225 TTY 607 001 3979) to talk to a trained counselor call 1-800-273-TALK (toll free, 24 hour hotline)   Patient verbalizes understanding of instructions and care plan provided today and agrees to view in [place]. Active MyChart status confirmed with patient.    Noreene Larsson RN, MSN, [place] [facility]: [phone]

## 2021-03-01 DIAGNOSIS — H6501 Acute serous otitis media, right ear: Secondary | ICD-10-CM | POA: Diagnosis not present

## 2021-03-05 DIAGNOSIS — E1140 Type 2 diabetes mellitus with diabetic neuropathy, unspecified: Secondary | ICD-10-CM

## 2021-03-05 DIAGNOSIS — E7800 Pure hypercholesterolemia, unspecified: Secondary | ICD-10-CM | POA: Diagnosis not present

## 2021-03-05 DIAGNOSIS — N183 Chronic kidney disease, stage 3 unspecified: Secondary | ICD-10-CM

## 2021-03-05 DIAGNOSIS — I129 Hypertensive chronic kidney disease with stage 1 through stage 4 chronic kidney disease, or unspecified chronic kidney disease: Secondary | ICD-10-CM | POA: Diagnosis not present

## 2021-03-05 DIAGNOSIS — M1990 Unspecified osteoarthritis, unspecified site: Secondary | ICD-10-CM | POA: Diagnosis not present

## 2021-03-05 DIAGNOSIS — I10 Essential (primary) hypertension: Secondary | ICD-10-CM

## 2021-03-05 DIAGNOSIS — F331 Major depressive disorder, recurrent, moderate: Secondary | ICD-10-CM | POA: Diagnosis not present

## 2021-03-05 DIAGNOSIS — E785 Hyperlipidemia, unspecified: Secondary | ICD-10-CM

## 2021-03-05 DIAGNOSIS — E1169 Type 2 diabetes mellitus with other specified complication: Secondary | ICD-10-CM

## 2021-03-05 DIAGNOSIS — F3342 Major depressive disorder, recurrent, in full remission: Secondary | ICD-10-CM | POA: Diagnosis not present

## 2021-03-13 ENCOUNTER — Other Ambulatory Visit: Payer: Medicare Other

## 2021-03-14 ENCOUNTER — Other Ambulatory Visit: Payer: Self-pay

## 2021-03-14 ENCOUNTER — Other Ambulatory Visit: Payer: Medicare Other

## 2021-03-14 ENCOUNTER — Other Ambulatory Visit: Payer: Self-pay | Admitting: Family Medicine

## 2021-03-14 DIAGNOSIS — I129 Hypertensive chronic kidney disease with stage 1 through stage 4 chronic kidney disease, or unspecified chronic kidney disease: Secondary | ICD-10-CM | POA: Diagnosis not present

## 2021-03-14 DIAGNOSIS — N183 Chronic kidney disease, stage 3 unspecified: Secondary | ICD-10-CM

## 2021-03-14 DIAGNOSIS — E785 Hyperlipidemia, unspecified: Secondary | ICD-10-CM

## 2021-03-14 DIAGNOSIS — E1169 Type 2 diabetes mellitus with other specified complication: Secondary | ICD-10-CM | POA: Diagnosis not present

## 2021-03-14 DIAGNOSIS — Z1329 Encounter for screening for other suspected endocrine disorder: Secondary | ICD-10-CM

## 2021-03-14 DIAGNOSIS — E7800 Pure hypercholesterolemia, unspecified: Secondary | ICD-10-CM

## 2021-03-14 DIAGNOSIS — N1831 Chronic kidney disease, stage 3a: Secondary | ICD-10-CM

## 2021-03-14 DIAGNOSIS — E1140 Type 2 diabetes mellitus with diabetic neuropathy, unspecified: Secondary | ICD-10-CM

## 2021-03-14 DIAGNOSIS — Z125 Encounter for screening for malignant neoplasm of prostate: Secondary | ICD-10-CM

## 2021-03-15 LAB — CBC WITH DIFFERENTIAL/PLATELET
Absolute Monocytes: 522 cells/uL (ref 200–950)
Basophils Absolute: 63 cells/uL (ref 0–200)
Basophils Relative: 0.7 %
Eosinophils Absolute: 171 cells/uL (ref 15–500)
Eosinophils Relative: 1.9 %
HCT: 38 % — ABNORMAL LOW (ref 38.5–50.0)
Hemoglobin: 12.6 g/dL — ABNORMAL LOW (ref 13.2–17.1)
Lymphs Abs: 3600 cells/uL (ref 850–3900)
MCH: 32.6 pg (ref 27.0–33.0)
MCHC: 33.2 g/dL (ref 32.0–36.0)
MCV: 98.4 fL (ref 80.0–100.0)
MPV: 12.4 fL (ref 7.5–12.5)
Monocytes Relative: 5.8 %
Neutro Abs: 4644 cells/uL (ref 1500–7800)
Neutrophils Relative %: 51.6 %
Platelets: 234 10*3/uL (ref 140–400)
RBC: 3.86 10*6/uL — ABNORMAL LOW (ref 4.20–5.80)
RDW: 12 % (ref 11.0–15.0)
Total Lymphocyte: 40 %
WBC: 9 10*3/uL (ref 3.8–10.8)

## 2021-03-15 LAB — BASIC METABOLIC PANEL WITH GFR
BUN/Creatinine Ratio: 20 (calc) (ref 6–22)
BUN: 28 mg/dL — ABNORMAL HIGH (ref 7–25)
CO2: 26 mmol/L (ref 20–32)
Calcium: 9 mg/dL (ref 8.6–10.3)
Chloride: 105 mmol/L (ref 98–110)
Creat: 1.41 mg/dL — ABNORMAL HIGH (ref 0.70–1.22)
Glucose, Bld: 213 mg/dL — ABNORMAL HIGH (ref 65–99)
Potassium: 4.3 mmol/L (ref 3.5–5.3)
Sodium: 140 mmol/L (ref 135–146)
eGFR: 48 mL/min/{1.73_m2} — ABNORMAL LOW (ref >=60)

## 2021-03-15 LAB — TEST AUTHORIZATION: TEST CODE:: 10165639916802

## 2021-03-15 LAB — HEMOGLOBIN A1C
Hgb A1c MFr Bld: 7.5 % of total Hgb — ABNORMAL HIGH (ref <5.7)
Mean Plasma Glucose: 169 mg/dL
eAG (mmol/L): 9.3 mmol/L

## 2021-03-20 ENCOUNTER — Ambulatory Visit (INDEPENDENT_AMBULATORY_CARE_PROVIDER_SITE_OTHER): Payer: Medicare Other | Admitting: Family Medicine

## 2021-03-20 ENCOUNTER — Other Ambulatory Visit: Payer: Self-pay

## 2021-03-20 VITALS — BP 112/49 | HR 74 | Ht 69.0 in | Wt 185.8 lb

## 2021-03-20 DIAGNOSIS — E11319 Type 2 diabetes mellitus with unspecified diabetic retinopathy without macular edema: Secondary | ICD-10-CM | POA: Diagnosis not present

## 2021-03-20 DIAGNOSIS — I129 Hypertensive chronic kidney disease with stage 1 through stage 4 chronic kidney disease, or unspecified chronic kidney disease: Secondary | ICD-10-CM

## 2021-03-20 DIAGNOSIS — F3342 Major depressive disorder, recurrent, in full remission: Secondary | ICD-10-CM | POA: Diagnosis not present

## 2021-03-20 DIAGNOSIS — E1140 Type 2 diabetes mellitus with diabetic neuropathy, unspecified: Secondary | ICD-10-CM | POA: Diagnosis not present

## 2021-03-20 DIAGNOSIS — N183 Chronic kidney disease, stage 3 unspecified: Secondary | ICD-10-CM | POA: Diagnosis not present

## 2021-03-20 NOTE — Progress Notes (Signed)
Subjective:    Patient ID: Charles York, male    DOB: March 24, 1930, 88 y.o.   MRN: [id number]  Charles York is a 88 y.o. male presenting on 03/20/2021 for Diabetes and Hypertension   HPI  CHRONIC HTN CKD-III Improved w/ less med Home BP and in office avg DBP high 40s low 50s up to 60s He feels well On Losartan 25mg  daily Reports good compliance, took meds today. Tolerating well, w/o complaints. Denies CP, dyspnea, HA, edema, dizziness / lightheadedness   CHRONIC DM, Type 2: Reports no concerns A1c stable at 7.5% previously 7.2 to 7.6 over past 3 years. CBGs: Avg 150, has had high 180s Meds: Lantus 27 units daily and Novolog mix 12 u BID Reports  good compliance. Tolerating well w/o side-effects No longer on ARB Lifestyle: - Diet (improved)  Admits 1 episode of hypoglycemia predictable with skipped meal and he felt classic symptoms he identified promptly and ate to fix it. Last DM Eye exam 11/13/20  Nov 13, 2020 01:06 PM SECONDARY Type 2 diab with severe nonp rtnop with macular edema, r eye [place]   Denies hypoglycemia, polyuria, visual changes, numbness or tingling.     Major Depression, recurrent in remission GAD Anxiety Interval updates - prior history on sertraline (too strong or side effect), lexapro was successful but then came off med not needed, buspar caused dizziness Was on Wellbutrin and Lexapro Currently doing well  Osteoarthritis Bilateral Shoulders and Knee, Pain Reports chronic problem with joint pain Takes Osteobiflex with good results He uses stationary bicycle    Depression screen [facility] 2/9 03/20/2021 10/18/2020 09/11/2020  Decreased Interest 0 0 0  Down, Depressed, Hopeless 0 0 0  PHQ - 2 Score 0 0 0  Altered sleeping 0 0 -  Tired, decreased energy 0 0 -  Change in appetite 0 0 -  Feeling bad or failure about yourself  0 0 -  Trouble concentrating 0 0 -  Moving slowly or fidgety/restless 0 0 -  Suicidal thoughts 0  0 -  PHQ-9 Score 0 0 -  Difficult doing work/chores Not difficult at all Not difficult at all -  Some recent data might be hidden    Social History   Tobacco Use   Smoking status: Former    Types: Cigarettes    Quit date: 02/04/1983    Years since quitting: 38.1   Smokeless tobacco: Former  Scientific laboratory technician Use: Never used  Substance Use Topics   Alcohol use: No    Alcohol/week: 0.0 standard drinks   Drug use: No    Review of Systems Per HPI unless specifically indicated above     Objective:    BP (!) 112/49    Pulse 74    Ht 5\' 9"  (1.753 m)    Wt 185 lb 12.8 oz (84.3 kg)    SpO2 97%    BMI 27.44 kg/m   Wt Readings from Last 3 Encounters:  03/20/21 185 lb 12.8 oz (84.3 kg)  01/09/21 188 lb 3.2 oz (85.4 kg)  10/18/20 183 lb 3.2 oz (83.1 kg)    Physical Exam Vitals and nursing note reviewed.  Constitutional:      General: He is not in acute distress.    Appearance: He is well-developed. He is not diaphoretic.     Comments: Well-appearing, comfortable, cooperative  HENT:     Head: Normocephalic and atraumatic.  Eyes:     General:  Right eye: No discharge.        Left eye: No discharge.     Conjunctiva/sclera: Conjunctivae normal.  Neck:     Thyroid: No thyromegaly.  Cardiovascular:     Rate and Rhythm: Normal rate and regular rhythm.     Pulses: Normal pulses.     Heart sounds: Normal heart sounds. No murmur heard. Pulmonary:     Effort: Pulmonary effort is normal. No respiratory distress.     Breath sounds: Normal breath sounds. No wheezing or rales.  Musculoskeletal:        General: Normal range of motion.     Cervical back: Normal range of motion and neck supple.  Lymphadenopathy:     Cervical: No cervical adenopathy.  Skin:    General: Skin is warm and dry.     Findings: No erythema or rash.  Neurological:     Mental Status: He is alert and oriented to person, place, and time. Mental status is at baseline.  Psychiatric:        Behavior:  Behavior normal.     Comments: Well groomed, good eye contact, normal speech and thoughts   Recent Labs    10/12/20 0819 03/14/21 0820  HGBA1C 7.2* 7.5*     Results for orders placed or performed in visit on 03/20/21  HM DIABETES EYE EXAM  Result Value Ref Range   HM Diabetic Eye Exam Retinopathy (A) No Retinopathy      Assessment & Plan:   Problem List Items Addressed This Visit     Type 2 diabetes, controlled, with neuropathy ([place]) - Primary    Stable DM A1c 7.5 at goal Complications - CKD-III, peripheral neuropathy, DM retinopathy No further hypoglycemia OFF Trulicity due to side effect  Plan:  1. Continue Lantus 27u daily, Novolog 12 BID wc Encourage improved lifestyle - low carb, low sugar diet, reduce portion size, continue improving regular exercise Check CBG, bring log to next visit for review Continue ASA, ARB, Statin      Relevant Medications   losartan (COZAAR) 25 MG tablet   Major depression, recurrent, full remission (HCC)    Improved major depression, moderate recurrent now in remission See improved PHQ GAD scores Off therapy, no longer on wellbutrin or SSRI      Diabetic retinopathy ([place])    Followed by Ophthalmology      Relevant Medications   losartan (COZAAR) 25 MG tablet   Benign hypertension with CKD (chronic kidney disease) stage III (HCC)    Well-controlled HTN - Home BP readings controlled  Complication with CKD-III    Plan:  1. Continue Losartan ARB 25mg  daily due to side effects 2. Encourage improved lifestyle - low sodium diet, regular exercise 3. Continue monitor BP outside office, bring readings to next visit, if persistently >140/90 or new symptoms notify office sooner. Or if low BP < 100/70 and symptomatic      Relevant Medications   losartan (COZAAR) 25 MG tablet    No orders of the defined types were placed in this encounter.     Follow up plan: Return in about 7 months (around 10/18/2021) for 7 month fasting lab only  then 1 week later Annual Physical.   Nobie Putnam, DO [facility] 03/20/2021, 8:44 AM

## 2021-03-20 NOTE — Patient Instructions (Addendum)
Thank you for coming to the office today.  Keep up the great work with your exercise!  Recent Labs    10/12/20 0819 03/14/21 0820  HGBA1C 7.2* 7.5*    No change to medications today.  DUE for FASTING BLOOD WORK (no food or drink after midnight before the lab appointment, only water or coffee without cream/sugar on the morning of)  SCHEDULE "Lab Only" visit in the morning at the clinic for lab draw in 7 MONTHS   - Make sure Lab Only appointment is at about 1 week before your next appointment, so that results will be available  For Lab Results, once available within 2-3 days of blood draw, you can can log in to MyChart online to view your results and a brief explanation. Also, we can discuss results at next follow-up visit.   Please schedule a Follow-up Appointment to: Return in about 7 months (around 10/18/2021) for 7 month fasting lab only then 1 week later Annual Physical.  If you have any other questions or concerns, please feel free to call the office or send a message through [place]. You may also schedule an earlier appointment if necessary.  Additionally, you may be receiving a survey about your experience at our office within a few days to 1 week by e-mail or mail. We value your feedback.  Nobie Putnam, DO [place]

## 2021-03-21 ENCOUNTER — Other Ambulatory Visit: Payer: Self-pay | Admitting: Family Medicine

## 2021-03-21 DIAGNOSIS — E785 Hyperlipidemia, unspecified: Secondary | ICD-10-CM

## 2021-03-21 DIAGNOSIS — E1169 Type 2 diabetes mellitus with other specified complication: Secondary | ICD-10-CM

## 2021-03-21 DIAGNOSIS — E1140 Type 2 diabetes mellitus with diabetic neuropathy, unspecified: Secondary | ICD-10-CM

## 2021-03-21 DIAGNOSIS — I129 Hypertensive chronic kidney disease with stage 1 through stage 4 chronic kidney disease, or unspecified chronic kidney disease: Secondary | ICD-10-CM

## 2021-03-21 DIAGNOSIS — Z0000 Encounter for general adult medical examination without abnormal findings: Secondary | ICD-10-CM

## 2021-03-21 DIAGNOSIS — R351 Nocturia: Secondary | ICD-10-CM

## 2021-03-21 NOTE — Assessment & Plan Note (Signed)
Improved major depression, moderate recurrent now in remission See improved PHQ GAD scores Off therapy, no longer on wellbutrin or SSRI

## 2021-03-21 NOTE — Assessment & Plan Note (Signed)
Well-controlled HTN - Home BP readings controlled  Complication with CKD-III    Plan:  1. Continue Losartan ARB 25mg  daily due to side effects 2. Encourage improved lifestyle - low sodium diet, regular exercise 3. Continue monitor BP outside office, bring readings to next visit, if persistently >140/90 or new symptoms notify office sooner. Or if low BP < 100/70 and symptomatic

## 2021-03-21 NOTE — Assessment & Plan Note (Signed)
Stable DM A1c 7.5 at goal Complications - CKD-III, peripheral neuropathy, DM retinopathy No further hypoglycemia OFF Trulicity due to side effect  Plan:  1. Continue Lantus 27u daily, Novolog 12 BID wc Encourage improved lifestyle - low carb, low sugar diet, reduce portion size, continue improving regular exercise Check CBG, bring log to next visit for review Continue ASA, ARB, Statin

## 2021-03-21 NOTE — Assessment & Plan Note (Signed)
Followed by Ophthalmology

## 2021-03-29 DIAGNOSIS — J310 Chronic rhinitis: Secondary | ICD-10-CM | POA: Diagnosis not present

## 2021-03-29 DIAGNOSIS — H6501 Acute serous otitis media, right ear: Secondary | ICD-10-CM | POA: Diagnosis not present

## 2021-04-22 ENCOUNTER — Telehealth: Payer: Medicare Other

## 2021-04-22 ENCOUNTER — Ambulatory Visit (INDEPENDENT_AMBULATORY_CARE_PROVIDER_SITE_OTHER): Payer: Medicare Other

## 2021-04-22 DIAGNOSIS — F3342 Major depressive disorder, recurrent, in full remission: Secondary | ICD-10-CM

## 2021-04-22 DIAGNOSIS — E1169 Type 2 diabetes mellitus with other specified complication: Secondary | ICD-10-CM

## 2021-04-22 DIAGNOSIS — F419 Anxiety disorder, unspecified: Secondary | ICD-10-CM

## 2021-04-22 DIAGNOSIS — E1140 Type 2 diabetes mellitus with diabetic neuropathy, unspecified: Secondary | ICD-10-CM

## 2021-04-22 DIAGNOSIS — I129 Hypertensive chronic kidney disease with stage 1 through stage 4 chronic kidney disease, or unspecified chronic kidney disease: Secondary | ICD-10-CM

## 2021-04-22 NOTE — Chronic Care Management (AMB) (Signed)
?Chronic Care Management  ? ?CCM RN Visit Note ? ?04/22/2021 ?Name: Charles York MRN: [id number] DOB: 05/25/1930 ? ?Subjective: ?Charles York is a 88 y.o. year old male who is a primary care patient of [name], DO. The care management team was consulted for assistance with disease management and care coordination needs.   ? ?Engaged with patient by telephone for follow up visit in response to provider referral for case management and/or care coordination services.  ? ?Consent to Services:  ?The patient was given information about Chronic Care Management services, agreed to services, and gave verbal consent prior to initiation of services.  Please see initial visit note for detailed documentation.  ? ?Patient agreed to services and verbal consent obtained.  ? ?Assessment: Review of patient past medical history, allergies, medications, health status, including review of consultants reports, laboratory and other test data, was performed as part of comprehensive evaluation and provision of chronic care management services.  ? ?SDOH (Social Determinants of Health) assessments and interventions performed:   ? ?CCM Care Plan ? ?Allergies  ?Allergen Reactions  ? Trulicity [Dulaglutide] Swelling  ?  Swollen lips, shortness of breath,   ? Buspirone   ?  Dizziness  ? Penicillins Itching and Rash  ?  Has patient had a PCN reaction causing immediate rash, facial/tongue/throat swelling, SOB or lightheadedness with hypotension: No ?Has patient had a PCN reaction causing severe rash involving mucus membranes or skin necrosis: No ?Has patient had a PCN reaction that required hospitalization: No ?Has patient had a PCN reaction occurring within the last 10 years: No ?If all of the above answers are "NO", then may proceed with Cephalosporin use. ?  ? ? ?Outpatient Encounter Medications as of 04/22/2021  ?Medication Sig Note  ? aspirin 325 MG EC tablet TK 1 T PO QD FOR UP TO 90 DAYS THEN CAN REDUCE TO 81 MG DAILY   ?  atorvastatin (LIPITOR) 40 MG tablet Take 1 tablet (40 mg total) by mouth at bedtime.   ? atropine 1 % ophthalmic solution INSTILL 1 DROP IN RIGHT EYE TWO TIMES A DAY SURGERY 07/18/19   ? Cholecalciferol 25 MCG (1000 UT) tablet TAKE TWO TABLETS BY MOUTH EVERY DAY AS NEEDED   ? ferrous sulfate 325 (65 FE) MG tablet Take 325 mg by mouth every other day.   ? hydroxypropyl methylcellulose / hypromellose (ISOPTO TEARS / GONIOVISC) 2.5 % ophthalmic solution Place 1 drop into both eyes 4 (four) times daily.   ? insulin aspart protamine - aspart (NOVOLOG 70/30 MIX) (70-30) 100 UNIT/ML FlexPen Inject 12 Units into the skin 2 (two) times daily. In the morning and evening   ? ipratropium (ATROVENT) 0.06 % nasal spray USE 2 SPRAYS IN EACH NOSTRIL FOUR TIMES DAILY FOR UP TO 5 TO 7 DAYS THEN STOP 12/29/2018: Dropped to twice daily due to excessive dryness.  ? LANTUS 100 UNIT/ML injection INJECT 27 UNITS SUBCUTANEOUSLY BEFORE BREAKFAST   ? losartan (COZAAR) 25 MG tablet Take 25 mg by mouth daily.   ? omeprazole (PRILOSEC) 20 MG capsule TAKE 1 CAPSULE BY MOUTH TWICE A DAY BEFORE A MEAL   ? polyethylene glycol powder (GLYCOLAX/MIRALAX) 17 GM/SCOOP powder Take 17-34 g by mouth 2 (two) times daily as needed for mild constipation or moderate constipation. (Patient not taking: Reported on 01/09/2021)   ? senna (SENOKOT) 8.6 MG tablet Take 1 tablet by mouth as needed for constipation.   ? sodium chloride (MURO 128) 5 % ophthalmic solution INSTILL 1  DROP IN [facility] EYE FOUR TIMES A DAY   ? ?No facility-administered encounter medications on file as of 04/22/2021.  ? ? ?Patient Active Problem List  ? Diagnosis Date Noted  ? Overweight with body mass index (BMI) of 27 to 27.9 in adult 01/09/2021  ? Major depression, recurrent, full remission ([place]) 10/12/2019  ? GAD (generalized anxiety disorder) 10/12/2019  ? Chronic right shoulder pain 08/17/2018  ? Absolute anemia 07/30/2018  ? Primary osteoarthritis involving multiple joints 06/15/2018  ? GERD  (gastroesophageal reflux disease) 11/18/2016  ? Diabetic retinopathy ([place]) 11/18/2016  ? Hyperlipidemia associated with type 2 diabetes mellitus ([place]) 11/18/2016  ? Daytime sleepiness 07/15/2016  ? Fatigue 07/15/2016  ? Presbycusis of both ears 07/15/2016  ? Tinnitus of right ear 07/15/2016  ? Allergic rhinitis due to allergen 06/09/2016  ? CKD (chronic kidney disease), stage III ([place]) 02/27/2016  ? BPPV (benign paroxysmal positional vertigo), left 02/27/2016  ? Chronic pansinusitis 02/27/2016  ? Arthritis 07/03/2015  ? Colon polyp 07/03/2015  ? Benign hypertension with CKD (chronic kidney disease) stage III ([place]) 07/03/2015  ? High cholesterol 07/03/2015  ? Type 2 diabetes, controlled, with neuropathy ([place]) 07/03/2015  ? ? ?Conditions to be addressed/monitored:HTN, HLD, DMII, Anxiety, and Depression ? ?Care Plan : RNCM: General Plan of Care (Adult) for Chronic Disease Management and Care Coordination Needs  ?Updates made by Vanita Ingles, RN since 04/22/2021 12:00 AM  ?  ? ?Problem: RNCM: Development of Plan of Care for Chronic Disease Management (HTN, HLD, DM2, Depression, Anxiety)   ?Priority: High  ?  ? ?Goal: RNCM: Effective Management of Plan of Care for Chronic Disease Management (HTN, HLD, DM, Depression, Anxiety)   ?Start Date: 12/24/2020  ?Expected End Date: 12/24/2021  ?Priority: High  ?Note:   ?Current Barriers:  ?Knowledge Deficits related to plan of care for management of HTN, HLD, DMII, and Anxiety with Excessive Worry, and Depression: depressed mood ?anxiety  ?Care Coordination needs related to Limited social support, Mental Health Concerns , and Social Isolation  ?Chronic Disease Management support and education needs related to HTN, HLD, DMII, and Anxiety with Excessive Worry, and Depression: depressed mood ?anxiety ? ?RNCM Clinical Goal(s):  ?Patient will verbalize basic understanding of HTN, HLD, DMII, Anxiety, and Depression disease process and self health management plan as evidenced by  following the plan of care, taking medications, monitoring and following dietary restrictions and working with the CCM team to effectively manage health and well being ?take all medications exactly as prescribed and will call provider for medication related questions as evidenced by compliance and calling for refills before running out of medications     ?attend all scheduled medical appointments: 10-18-2021 as evidenced by keeping appointments and call to reschedule appointments if needed         ?continue to work with [place] and/or Social Worker to address care management and care coordination needs related to HTN, HLD, DMII, Anxiety, and Depression as evidenced by adherence to CM Team Scheduled appointments     ?demonstrate a decrease in HTN, HLD, DMII, Anxiety, and Depression exacerbations  as evidenced by working with the CCM team to optimize plan of care for chronic conditions ?demonstrate ongoing self health care management ability to effectively manage chronic conditions  as evidenced by working with the CCM team through collaboration with Consulting civil engineer, provider, and care team.  ? ?Interventions: ?1:1 collaboration with primary care provider regarding development and update of comprehensive plan of care as evidenced by provider attestation  and co-signature ?Inter-disciplinary care team collaboration (see longitudinal plan of care) ?Evaluation of current treatment plan related to  self management and patient's adherence to plan as established by provider ? ? ?Diabetes:  (Status: Goal on Track (progressing): YES.) Long Term Goal  ? ?Lab Results  ?Component Value Date  ? HGBA1C 7.5 (H) 03/14/2021  ?Previous on 10-12-2020 was 7.2% ?Assessed patient's understanding of A1c goal: <7% ?Provided education to patient about basic DM disease process; ?Reviewed medications with patient and discussed importance of medication adherence. 04-22-2021:Is compliant with medications;        ?Reviewed prescribed diet with  patient heart healthy/ADA. 04-22-2021: The patient is compliant with heart healthy/ADA diet; ?Counseled on importance of regular laboratory monitoring as prescribe. 04-22-2021: Has regular labwork;        ?Discuss

## 2021-04-22 NOTE — Patient Instructions (Signed)
Visit Information ? ?Thank you for taking time to visit with me today. Please don't hesitate to contact me if I can be of assistance to you before our next scheduled telephone appointment. ? ?Following are the goals we discussed today:  ?RNCM Clinical Goal(s):  ?Patient will verbalize basic understanding of HTN, HLD, DMII, Anxiety, and Depression disease process and self health management plan as evidenced by following the plan of care, taking medications, monitoring and following dietary restrictions and working with the CCM team to effectively manage health and well being ?take all medications exactly as prescribed and will call provider for medication related questions as evidenced by compliance and calling for refills before running out of medications     ?attend all scheduled medical appointments: 10-18-2021 as evidenced by keeping appointments and call to reschedule appointments if needed         ?continue to work with [place] and/or Social Worker to address care management and care coordination needs related to HTN, HLD, DMII, Anxiety, and Depression as evidenced by adherence to CM Team Scheduled appointments     ?demonstrate a decrease in HTN, HLD, DMII, Anxiety, and Depression exacerbations  as evidenced by working with the CCM team to optimize plan of care for chronic conditions ?demonstrate ongoing self health care management ability to effectively manage chronic conditions  as evidenced by working with the CCM team through collaboration with Consulting civil engineer, provider, and care team.  ?  ?Interventions: ?1:1 collaboration with primary care provider regarding development and update of comprehensive plan of care as evidenced by provider attestation and co-signature ?Inter-disciplinary care team collaboration (see longitudinal plan of care) ?Evaluation of current treatment plan related to  self management and patient's adherence to plan as established by provider ?  ?  ?Diabetes:  (Status: Goal on  Track (progressing): YES.) Long Term Goal  ?  ?     ?Lab Results  ?Component Value Date  ?  HGBA1C 7.5 (H) 03/14/2021  ?Previous on 10-12-2020 was 7.2% ?Assessed patient's understanding of A1c goal: <7% ?Provided education to patient about basic DM disease process; ?Reviewed medications with patient and discussed importance of medication adherence. 04-22-2021:Is compliant with medications;        ?Reviewed prescribed diet with patient heart healthy/ADA. 04-22-2021: The patient is compliant with heart healthy/ADA diet; ?Counseled on importance of regular laboratory monitoring as prescribe. 04-22-2021: Has regular labwork;        ?Discussed plans with patient for ongoing care management follow up and provided patient with direct contact information for care management team;      ?Provided patient with written educational materials related to hypo and hyperglycemia and importance of correct treatment. 04-22-2021: Denies any sx and sx of hypoglycemia, the patient states he is having better blood sugars since getting his exercise bike. He does it every morning for at least 20 minutes.       ?Reviewed scheduled/upcoming provider appointments including: 10-18-2021 at 0900 am;         ?call provider for findings outside established parameters;       ?Review of patient status, including review of consultants reports, relevant laboratory and other test results, and medications completed;       ?  ?Anxiety and Depression   (Status: Goal on Track (progressing): YES.) Long Term Goal  ?Evaluation of current treatment plan related to Anxiety and Depression, Limited social support, Mental Health Concerns , and Social Isolation self-management and patient's adherence to plan as established by provider. 02-25-2021:  The patient is doing well. Denies any new concerns. States that his epigastric pain is gone now that he is taking the medications and saw pcp in December for GERD. Denies any acute distress or mental health issues at this time.  Will continue to monitor for changes. 04-22-2021: The patient is doing well. Denies any new issues with anxiety or depression. Saw the pcp in February and went to the [place] on 04-17-2021. The patient states he is stable. Will continue to monitor for changes or new needs.  ?Discussed plans with patient for ongoing care management follow up and provided patient with direct contact information for care management team ?Advised patient to call the office for changes in mood, depression, and anxiety; ?Provided education to patient re: being around others who make him happy, talking to family and friends on the phone and when feeling down do activities that help him feel better.  12-24-2020: The patient states he and his daughter will be having Thanksgiving together. He was getting ready to take a nap. He states he is in a good place with his depression and anxiety at this time. Will continue to monitor for changes. 04-22-2021: Is doing well and denies any acute findings. States his conditions are stable.  ?Reviewed medications with patient and discussed compliance ; ?Reviewed scheduled/upcoming provider appointments including 10-18-2021 at 0900 am; ?Social Work referral for ongoing support and education; ?Discussed plans with patient for ongoing care management follow up and provided patient with direct contact information for care management team; ?Screening for signs and symptoms of depression related to chronic disease state;  ?Assessed social determinant of health barriers;  ?  ?Hyperlipidemia:  (Status: Goal on Track (progressing): YES.) Long Term Goal  ?     ?Lab Results  ?Component Value Date  ?  CHOL 127 10/12/2020  ?  HDL 46 10/12/2020  ?  [place] 59 10/12/2020  ?  TRIG 134 10/12/2020  ?  CHOLHDL 2.8 10/12/2020  ?  ?  ?Medication review performed; medication list updated in electronic medical record. 04-22-2021: the patient takes Lipitor 40 mg QD. The patient is compliant with medications ?Provider established  cholesterol goals reviewed. 02-25-2021: The patient is at goal with his readings. Praised for effective management; ?Counseled on importance of regular laboratory monitoring as prescribed. 04-22-2021: The patient has regular lab work done. Next lipid panel for September 2023; ?Provided HLD educational materials; ?Reviewed role and benefits of statin for ASCVD risk reduction; ?Discussed strategies to manage statin-induced myalgias; ?Reviewed importance of limiting foods high in cholesterol. 04-22-2021: Is compliant with heart healthy/Ada diet  ?  ?Hypertension: (Status: Goal on Track (progressing): YES.) ?Last practice recorded BP readings:  ?   ?BP Readings from Last 3 Encounters:  ?03/20/21 (!) 112/49  ?01/09/21 (!) 106/49  ?10/18/20 (!) 120/54  ?Most recent eGFR/CrCl:  ?     ?Lab Results  ?Component Value Date  ?  EGFR 48 (L) 03/14/2021  ?  No components found for: CRCL ?  ?Evaluation of current treatment plan related to hypertension self management and patient's adherence to plan as established by provider. 04-22-2021: Denies any issues with HTN or Heart healthy. The patient has always had low diastolic readings. States this is nothing usual for him. Denies any light headedness, dizziness, or headaches. Blood pressures are stable. He denies any acute distress. Will continue to monitor for changes. ;   ?Provided education to patient re: stroke prevention, s/s of heart attack and stroke; ?Reviewed prescribed diet heart healthy/ADA diet. 04-22-2021: The  patient is compliant with heart healthy/ADA diet  ?Reviewed medications with patient and discussed importance of compliance. 04-22-2021: Is compliant with medications. Denies any medication needs.  ?Discussed plans with patient for ongoing care management follow up and provided patient with direct contact information for care management team; ?Advised patient, providing education and rationale, to monitor blood pressure daily and record, calling PCP for findings outside  established parameters;  ?Advised patient to discuss blood pressure trends, any onset of light headed or dizziness, new concerns with provider; ?Provided education on prescribed diet heart healthy/ADA diet ;  ?Discus

## 2021-05-02 DIAGNOSIS — L820 Inflamed seborrheic keratosis: Secondary | ICD-10-CM | POA: Diagnosis not present

## 2021-05-02 DIAGNOSIS — L821 Other seborrheic keratosis: Secondary | ICD-10-CM | POA: Diagnosis not present

## 2021-05-02 DIAGNOSIS — L298 Other pruritus: Secondary | ICD-10-CM | POA: Diagnosis not present

## 2021-05-03 DIAGNOSIS — I129 Hypertensive chronic kidney disease with stage 1 through stage 4 chronic kidney disease, or unspecified chronic kidney disease: Secondary | ICD-10-CM

## 2021-05-03 DIAGNOSIS — E1169 Type 2 diabetes mellitus with other specified complication: Secondary | ICD-10-CM | POA: Diagnosis not present

## 2021-05-03 DIAGNOSIS — E785 Hyperlipidemia, unspecified: Secondary | ICD-10-CM

## 2021-05-03 DIAGNOSIS — F3342 Major depressive disorder, recurrent, in full remission: Secondary | ICD-10-CM

## 2021-05-03 DIAGNOSIS — N183 Chronic kidney disease, stage 3 unspecified: Secondary | ICD-10-CM

## 2021-05-03 DIAGNOSIS — E1140 Type 2 diabetes mellitus with diabetic neuropathy, unspecified: Secondary | ICD-10-CM

## 2021-05-20 ENCOUNTER — Other Ambulatory Visit: Payer: Self-pay | Admitting: Family Medicine

## 2021-05-20 DIAGNOSIS — K219 Gastro-esophageal reflux disease without esophagitis: Secondary | ICD-10-CM

## 2021-05-20 DIAGNOSIS — R110 Nausea: Secondary | ICD-10-CM

## 2021-05-20 NOTE — Telephone Encounter (Signed)
Requested Prescriptions  ?Pending Prescriptions Disp Refills  ?? omeprazole (PRILOSEC) 20 MG capsule [Pharmacy Med Name: OMEPRAZOLE DR 20 MG CAPSULE] 180 capsule 1  ?  Sig: TAKE 1 CAPSULE BY MOUTH TWICE A DAY BEFORE A MEAL  ?  ? Gastroenterology: Proton Pump Inhibitors Passed - 05/20/2021  2:33 AM  ?  ?  Passed - Valid encounter within last 12 months  ?  Recent Outpatient Visits   ?      ? 2 months ago Type 2 diabetes, controlled, with neuropathy ([place])  ? [place], DO  ? 4 months ago Epigastric pain  ? [facility] [place], Coralie Keens, NP  ? 7 months ago Annual physical exam  ? [place], DO  ? 8 months ago Major depressive disorder, recurrent, moderate ([place])  ? [place], DO  ? 1 year ago Entrapment of right ulnar nerve  ? [place], DO  ?  ?  ?Future Appointments   ?        ? In 4 months  [facility] [place], [place]  ? In 5 months [place], Devonne Doughty, DO [facility], [place]  ?  ? ?  ?  ?  ? ? ?

## 2021-06-11 ENCOUNTER — Ambulatory Visit: Payer: Self-pay | Admitting: *Deleted

## 2021-06-11 NOTE — Telephone Encounter (Signed)
?  Chief Complaint: Upper abdominal Pain ?Symptoms: "Gnawing" pain at mid upper abdomen, loss of appetite, nausea in mornings "Passes" States has "Ulcer" Feels like that type of discomfort. ?Frequency: 2 weeks ?Pertinent Negatives: Patient denies Pain does not radiate, no CP or tightness, no diarrhea, vomiting. ?Disposition: '[]'$ ED /'[]'$ Urgent Care (no appt availability in office) / '[x]'$ Appointment(In office/virtual)/ '[]'$  [facility]/ '[]'$ Home Care/ '[]'$ Refused Recommended Disposition /'[]'$ [facility]/ '[]'$  Follow-up with PCP ?Additional Notes: Appt secured, per protocol within 24 hours. Care advise given, pt verbalizes understanding.  ?Reason for Disposition ? Age > 60 years ? ?Answer Assessment - Initial Assessment Questions ?1. LOCATION: "Where does it hurt?"  ?    Upper abdomen, below ribs ?2. RADIATION: "Does the pain shoot anywhere else?" (e.g., chest, back) ?    no ?3. ONSET: "When did the pain begin?" (Minutes, hours or days ago)  ?    2 weeks ?4. SUDDEN: "Gradual or sudden onset?" ?    Gradually ?5. PATTERN "Does the pain come and go, or is it constant?" ?   - If constant: "Is it getting better, staying the same, or worsening?"  ?    (Note: Constant means the pain never goes away completely; most serious pain is constant and it progresses)  ?   - If intermittent: "How long does it last?" "Do you have pain now?" ?    (Note: Intermittent means the pain goes away completely between bouts) ?    Constant ?6. SEVERITY: "How bad is the pain?"  (e.g., Scale 1-10; mild, moderate, or severe) ?   - MILD (1-3): doesn't interfere with normal activities, abdomen soft and not tender to touch  ?   - MODERATE (4-7): interferes with normal activities or awakens from sleep, abdomen tender to touch  ?   - SEVERE (8-10): excruciating pain, doubled over, unable to do any normal activities   ?    5/10, "Aching" ?7. RECURRENT SYMPTOM: "Have you ever had this type of stomach pain before?" If Yes, ask: "When was the last  time?" and "What happened that time?"  ?    Yes , "I have an ulcer" ?8. CAUSE: "What do you think is causing the stomach pain?" ?    Ulcer ?9. RELIEVING/AGGRAVATING FACTORS: "What makes it better or worse?" (e.g., movement, antacids, bowel movement) ?    Stays the same. ?10. OTHER SYMPTOMS: "Do you have any other symptoms?" (e.g., back pain, diarrhea, fever, urination pain, vomiting) ?      Nausea in mornings, passes. No appetite ? ?Protocols used: Abdominal Pain - Male-A-AH ? ?

## 2021-06-12 ENCOUNTER — Ambulatory Visit
Admission: RE | Admit: 2021-06-12 | Discharge: 2021-06-12 | Disposition: A | Discharge status: Home / Self Care | Payer: Medicare Other | Source: Ambulatory Visit | Attending: Internal Medicine | Admitting: Internal Medicine

## 2021-06-12 ENCOUNTER — Ambulatory Visit (INDEPENDENT_AMBULATORY_CARE_PROVIDER_SITE_OTHER): Payer: Medicare Other | Admitting: Internal Medicine

## 2021-06-12 ENCOUNTER — Ambulatory Visit
Admission: RE | Admit: 2021-06-12 | Discharge: 2021-06-12 | Disposition: A | Discharge status: Home / Self Care | Payer: Medicare Other | Attending: Internal Medicine | Admitting: Internal Medicine

## 2021-06-12 ENCOUNTER — Encounter: Payer: Self-pay | Admitting: Internal Medicine

## 2021-06-12 VITALS — BP 122/60 | HR 77 | Temp 96.9°F | Wt 180.0 lb

## 2021-06-12 DIAGNOSIS — R0789 Other chest pain: Secondary | ICD-10-CM | POA: Diagnosis not present

## 2021-06-12 DIAGNOSIS — R072 Precordial pain: Secondary | ICD-10-CM | POA: Diagnosis not present

## 2021-06-12 NOTE — Progress Notes (Signed)
? ?Subjective:  ? ? Patient ID: Charles York, male    DOB: 1930-02-27, 88 y.o.   MRN: [id number] ? ?HPI ? ?Patient presents to clinic today with complaint of sternal pain.  He reports this started 2 weeks ago.  He describes the pain as achy. The pain does not radiate. The pain seems worse when he bends forwards or lays flat on his back. The pain is not worse with taking a deep breath, or worse when he eats. He has tried Tylenol OTC with minimal relief of symptoms. He does have a history of GERD, managed on Omeprazole twice daily and Sulcrafate.  There is no upper GI on file. ? ?Review of Systems ? ?   ?Past Medical History:  ?Diagnosis Date  ? Anxiety   ? Arthritis   ? Cancer [facility])   ? skin  ? Chronic kidney disease   ? stage 3  ? Colon polyp   ? Diabetes mellitus without complication ([place])   ? GERD (gastroesophageal reflux disease)   ? Heartburn   ? Hyperlipemia   ? Hypertension   ? Obesity   ? ? ?Current Outpatient Medications  ?Medication Sig Dispense Refill  ? aspirin 325 MG EC tablet TK 1 T PO QD FOR UP TO 90 DAYS THEN CAN REDUCE TO 81 MG DAILY    ? atorvastatin (LIPITOR) 40 MG tablet Take 1 tablet (40 mg total) by mouth at bedtime. 90 tablet 3  ? atropine 1 % ophthalmic solution INSTILL 1 DROP IN RIGHT EYE TWO TIMES A DAY SURGERY 07/18/19    ? Cholecalciferol 25 MCG (1000 UT) tablet TAKE TWO TABLETS BY MOUTH EVERY DAY AS NEEDED    ? ferrous sulfate 325 (65 FE) MG tablet Take 325 mg by mouth every other day.    ? hydroxypropyl methylcellulose / hypromellose (ISOPTO TEARS / GONIOVISC) 2.5 % ophthalmic solution Place 1 drop into both eyes 4 (four) times daily.    ? insulin aspart protamine - aspart (NOVOLOG 70/30 MIX) (70-30) 100 UNIT/ML FlexPen Inject 12 Units into the skin 2 (two) times daily. In the morning and evening    ? ipratropium (ATROVENT) 0.06 % nasal spray USE 2 SPRAYS IN EACH NOSTRIL FOUR TIMES DAILY FOR UP TO 5 TO 7 DAYS THEN STOP 15 mL 2  ? LANTUS 100 UNIT/ML injection INJECT 27 UNITS SUBCUTANEOUSLY  BEFORE BREAKFAST 10 mL 3  ? losartan (COZAAR) 25 MG tablet Take 25 mg by mouth daily.    ? omeprazole (PRILOSEC) 20 MG capsule TAKE 1 CAPSULE BY MOUTH TWICE A DAY BEFORE A MEAL 180 capsule 1  ? polyethylene glycol powder (GLYCOLAX/MIRALAX) 17 GM/SCOOP powder Take 17-34 g by mouth 2 (two) times daily as needed for mild constipation or moderate constipation. (Patient not taking: Reported on 01/09/2021) 255 g 1  ? senna (SENOKOT) 8.6 MG tablet Take 1 tablet by mouth as needed for constipation.    ? sodium chloride (MURO 128) 5 % ophthalmic solution INSTILL 1 DROP IN [facility] EYE FOUR TIMES A DAY    ? ?No current facility-administered medications for this visit.  ? ? ?Allergies  ?Allergen Reactions  ? Trulicity [Dulaglutide] Swelling  ?  Swollen lips, shortness of breath,   ? Buspirone   ?  Dizziness  ? Penicillins Itching and Rash  ?  Has patient had a PCN reaction causing immediate rash, facial/tongue/throat swelling, SOB or lightheadedness with hypotension: No ?Has patient had a PCN reaction causing severe rash involving mucus membranes or skin necrosis:  No ?Has patient had a PCN reaction that required hospitalization: No ?Has patient had a PCN reaction occurring within the last 10 years: No ?If all of the above answers are "NO", then may proceed with Cephalosporin use. ?  ? ? ?Family History  ?Problem Relation Age of Onset  ? Cancer Father   ?     carcinoma  ? Alzheimer's disease Mother   ? ? ?Social History  ? ?Socioeconomic History  ? Marital status: Widowed  ?  Spouse name: Not on file  ? Number of children: Not on file  ? Years of education: Not on file  ? Highest education level: High school graduate  ?Occupational History  ? 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  Comment: retired  ?Tobacco Use  ? Smoking status: Former  ?  Types: Cigarettes  ?  Quit date: 02/04/1983  ?  Years since quitting: 38.3  ? Smokeless tobacco: Former  ?Vaping Use  ? Vaping Use: Never used  ?Substance and Sexual Activity  ? Alcohol use: No   ?  Alcohol/week: 0.0 standard drinks  ? Drug use: No  ? Sexual activity: Not on file  ?Other Topics Concern  ? Not on file  ?Social History Narrative  ? Not on file  ? ?Social Determinants of Health  ? ?Financial Resource Strain: Low Risk   ? Difficulty of Paying Living Expenses: Not hard at all  ?Food Insecurity: No Food Insecurity  ? Worried About Charity fundraiser in the Last Year: Never true  ? Ran Out of Food in the Last Year: Never true  ?Transportation Needs: No Transportation Needs  ? Lack of Transportation (Medical): No  ? Lack of Transportation (Non-Medical): No  ?Physical Activity: Insufficiently Active  ? Days of Exercise per Week: 4 days  ? Minutes of Exercise per Session: 30 min  ?Stress: No Stress Concern Present  ? Feeling of Stress : Not at all  ?Social Connections: Socially Isolated  ? Frequency of Communication with Friends and Family: More than three times a week  ? Frequency of Social Gatherings with Friends and Family: More than three times a week  ? Attends Religious Services: Never  ? Active Member of Clubs or Organizations: No  ? Attends Archivist Meetings: Never  ? Marital Status: Widowed  ?Intimate Partner Violence: Not At Risk  ? Fear of Current or Ex-Partner: No  ? Emotionally Abused: No  ? Physically Abused: No  ? Sexually Abused: No  ? ? ? ?Constitutional: Denies fever, malaise, fatigue, headache or abrupt weight changes.  ?Respiratory: Denies difficulty breathing, shortness of breath, cough or sputum production.   ?Cardiovascular: Denies chest pain, chest tightness, palpitations or swelling in the hands or feet.  ?Gastrointestinal: Patient reports chronic reflux and intermittent constipation.  Denies abdominal pain bloating, diarrhea or blood in the stool.  ?GU: Denies urgency, frequency, pain with urination, burning sensation, blood in urine, odor or discharge. ?Musculoskeletal: Patient reports sternal pain.  Denies decrease in range of motion, difficulty with gait,  muscle pain or joint pain and swelling.  ?Skin: Denies redness, rashes, lesions or ulcercations.  ? ? ?No other specific complaints in a complete review of systems (except as listed in HPI above). ? ?Objective:  ? Physical Exam ? ?BP 122/60 (BP Location: Left Arm, Patient Position: Sitting, Cuff Size: Large)   Pulse 77   Temp (!) 96.9 ?F (36.1 ?C) (Temporal)   Wt 180 lb (81.6 kg)   SpO2 99%  BMI 26.58 kg/m?  ? ?Wt Readings from Last 3 Encounters:  ?03/20/21 185 lb 12.8 oz (84.3 kg)  ?01/09/21 188 lb 3.2 oz (85.4 kg)  ?10/18/20 183 lb 3.2 oz (83.1 kg)  ? ? ?General: Appears his stated age, overweight, in NAD. ?Skin: Warm, dry and intact. No rashes noted. ?Cardiovascular: Normal rate and rhythm. S1,S2 noted.  No murmur, rubs or gallops noted.  ?Pulmonary/Chest: Normal effort and positive vesicular breath sounds. No respiratory distress. No wheezes, rales or ronchi noted.  ?Abdomen: Soft and nontender. Normal bowel sounds. No distention or masses noted. ?Musculoskeletal: Pain with palpation directly over the xiphoid process.  ?Neurological: Alert and oriented.  ? ?BMET ?   ?Component Value Date/Time  ? NA 140 03/14/2021 0820  ? NA 139 10/28/2017 1051  ? K 4.3 03/14/2021 0820  ? CL 105 03/14/2021 0820  ? CO2 26 03/14/2021 0820  ? GLUCOSE 213 (H) 03/14/2021 0820  ? BUN 28 (H) 03/14/2021 0820  ? BUN 24 10/28/2017 1051  ? CREATININE 1.41 (H) 03/14/2021 0820  ? CALCIUM 9.0 03/14/2021 0820  ? GFRNONAA 42 (L) 09/15/2020 0740  ? GFRNONAA 45 (L) 09/12/2019 0757  ? GFRAA 57 (L) 10/05/2019 1737  ? GFRAA 52 (L) 09/12/2019 0757  ? ? ?Lipid Panel  ?   ?Component Value Date/Time  ? CHOL 127 10/12/2020 0819  ? TRIG 134 10/12/2020 0819  ? HDL 46 10/12/2020 0819  ? CHOLHDL 2.8 10/12/2020 0819  ? LDLCALC 59 10/12/2020 0819  ? ? ?CBC ?   ?Component Value Date/Time  ? WBC 9.0 03/14/2021 0820  ? RBC 3.86 (L) 03/14/2021 0820  ? HGB 12.6 (L) 03/14/2021 0820  ? HGB 13.2 10/28/2017 1051  ? HCT 38.0 (L) 03/14/2021 0820  ? HCT 39.6  10/28/2017 1051  ? PLT 234 03/14/2021 0820  ? PLT 248 10/28/2017 1051  ? MCV 98.4 03/14/2021 0820  ? MCV 95 10/28/2017 1051  ? MCH 32.6 03/14/2021 0820  ? MCHC 33.2 03/14/2021 0820  ? RDW 12.0 03/14/2021 0820  ? RDW

## 2021-06-12 NOTE — Patient Instructions (Signed)
Costochondritis  Costochondritis is irritation and swelling (inflammation) of the tissue that connects the ribs to the breastbone (sternum). This tissue is called cartilage. Costochondritis causes pain in the front of the chest. Usually, the pain: Starts slowly. Is in more than one rib. What are the causes? The exact cause of this condition is not always known. It results from stress on the tissue in the affected area. The cause of this stress could be: Chest injury. Exercise or activity, such as lifting. Very bad coughing. What increases the risk? You are more likely to develop this condition if you: Are male. Are 30-40 years old. Recently started a new exercise or work activity. Have low levels of vitamin D. Have a condition that makes you cough often. What are the signs or symptoms? The main symptom of this condition is chest pain. The pain: Usually starts slowly and can be sharp or dull. Gets worse with deep breathing, coughing, or exercise. Gets better with rest. May be worse when you press on the affected area of your ribs and breastbone. How is this treated? This condition usually goes away on its own over time. Your doctor may prescribe an NSAID, such as ibuprofen. This can help reduce pain and inflammation. Treatment may also include: Resting and avoiding activities that make pain worse. Putting heat or ice on the painful area. Doing exercises to stretch your chest muscles. If these treatments do not help, your doctor may inject a numbing medicine to help relieve the pain. Follow these instructions at home: Managing pain, stiffness, and swelling     If told, put ice on the painful area. To do this: Put ice in a plastic bag. Place a towel between your skin and the bag. Leave the ice on for 20 minutes, 2-3 times a day. If told, put heat on the affected area. Do this as often as told by your doctor. Use the heat source that your doctor recommends, such as a moist heat  pack or a heating pad. Place a towel between your skin and the heat source. Leave the heat on for 20-30 minutes. Take off the heat if your skin turns bright red. This is very important if you cannot feel pain, heat, or cold. You may have a greater risk of getting burned. Activity Rest as told by your doctor. Do not do anything that makes your pain worse. This includes any activities that use chest, belly (abdomen), and side muscles. Do not lift anything that is heavier than 10 lb (4.5 kg), or the limit that you are told, until your doctor says that it is safe. Return to your normal activities as told by your doctor. Ask your doctor what activities are safe for you. General instructions Take over-the-counter and prescription medicines only as told by your doctor. Keep all follow-up visits as told by your doctor. This is important. Contact a doctor if: You have chills or a fever. Your pain does not go away or it gets worse. You have a cough that does not go away. Get help right away if: You are short of breath. You have very bad chest pain that is not helped by medicines, heat, or ice. These symptoms may be an emergency. Do not wait to see if the symptoms will go away. Get medical help right away. Call your local emergency services (911 in the [place] Do not drive yourself to the hospital. Summary Costochondritis is irritation and swelling (inflammation) of the tissue that connects the ribs to the breastbone (  sternum). This condition causes pain in the front of the chest. Treatment may include medicines, rest, heat or ice, and exercises. This information is not intended to replace advice given to you by your health care provider. Make sure you discuss any questions you have with your health care provider. Document Revised: 12/03/2018 Document Reviewed: 12/03/2018 Elsevier Patient Education  2023 Elsevier Inc[employer]  

## 2021-06-14 ENCOUNTER — Telehealth: Payer: Self-pay

## 2021-06-14 NOTE — Telephone Encounter (Signed)
LMTCB 06/14/2021.  PEC Please advise pt when he calls back.  ? ?Thanks,  ? ?-Mickel Baas  ?

## 2021-06-14 NOTE — Telephone Encounter (Signed)
-----   Message from Jearld Fenton, NP sent at 06/14/2021 11:08 AM EDT ----- ?X-ray of the sternum does not show any acute fractures or findings of the sternum or xiphoid process.  If pain persist, would recommend he follow-up with his PCP. ?

## 2021-06-17 ENCOUNTER — Telehealth: Payer: Medicare Other

## 2021-06-17 ENCOUNTER — Ambulatory Visit (INDEPENDENT_AMBULATORY_CARE_PROVIDER_SITE_OTHER): Payer: Medicare Other

## 2021-06-17 DIAGNOSIS — F419 Anxiety disorder, unspecified: Secondary | ICD-10-CM

## 2021-06-17 DIAGNOSIS — I129 Hypertensive chronic kidney disease with stage 1 through stage 4 chronic kidney disease, or unspecified chronic kidney disease: Secondary | ICD-10-CM

## 2021-06-17 DIAGNOSIS — E1140 Type 2 diabetes mellitus with diabetic neuropathy, unspecified: Secondary | ICD-10-CM

## 2021-06-17 DIAGNOSIS — F3342 Major depressive disorder, recurrent, in full remission: Secondary | ICD-10-CM

## 2021-06-17 DIAGNOSIS — E1169 Type 2 diabetes mellitus with other specified complication: Secondary | ICD-10-CM

## 2021-06-17 DIAGNOSIS — R0789 Other chest pain: Secondary | ICD-10-CM

## 2021-06-17 NOTE — Patient Instructions (Signed)
Visit Information ? ?Thank you for taking time to visit with me today. Please don't hesitate to contact me if I can be of assistance to you before our next scheduled telephone appointment. ? ?Following are the goals we discussed today:  ?Diabetes:  (Status: Goal on Track (progressing): YES.) Long Term Goal  ?  ?     ?Lab Results  ?Component Value Date  ?  HGBA1C 7.5 (H) 03/14/2021  ?Previous on 10-12-2020 was 7.2% ?Assessed patient's understanding of A1c goal: <7% ?Provided education to patient about basic DM disease process; ?Reviewed medications with patient and discussed importance of medication adherence. 06-17-2021:Is compliant with medications. Denies any new medication needs today;        ?Reviewed prescribed diet with patient heart healthy/ADA. 06-17-2021: The patient is compliant with heart healthy/ADA diet; ?Counseled on importance of regular laboratory monitoring as prescribe. 06-17-2021: Has regular labwork;        ?Discussed plans with patient for ongoing care management follow up and provided patient with direct contact information for care management team;      ?Provided patient with written educational materials related to hypo and hyperglycemia and importance of correct treatment. 04-22-2021: Denies any sx and sx of hypoglycemia, the patient states he is having better blood sugars since getting his exercise bike. He does it every morning for at least 20 minutes.       ?Reviewed scheduled/upcoming provider appointments including: 10-18-2021 at 0900 am;         ?call provider for findings outside established parameters;       ?Review of patient status, including review of consultants reports, relevant laboratory and other test results, and medications completed;       ?  ?Anxiety and Depression   (Status: Goal on Track (progressing): YES.) Long Term Goal  ?Evaluation of current treatment plan related to Anxiety and Depression, Limited social support, Mental Health Concerns , and Social Isolation  self-management and patient's adherence to plan as established by provider. 02-25-2021: The patient is doing well. Denies any new concerns. States that his epigastric pain is gone now that he is taking the medications and saw pcp in December for GERD. Denies any acute distress or mental health issues at this time. Will continue to monitor for changes. 04-22-2021: The patient is doing well. Denies any new issues with anxiety or depression. Saw the pcp in February and went to the [place] on 06-17-2021. The patient states he is stable. Will continue to monitor for changes or new needs. States that he is not having any new concerns with his depression. Overall feels like he is doing well. Will continue to monitor for changes.  ?Discussed plans with patient for ongoing care management follow up and provided patient with direct contact information for care management team ?Advised patient to call the office for changes in mood, depression, and anxiety; ?Provided education to patient re: being around others who make him happy, talking to family and friends on the phone and when feeling down do activities that help him feel better.  12-24-2020: The patient states he and his daughter will be having Thanksgiving together. He was getting ready to take a nap. He states he is in a good place with his depression and anxiety at this time. Will continue to monitor for changes. 06-17-2021: Is doing well and denies any acute findings. States his conditions are stable.  ?Reviewed medications with patient and discussed compliance ; ?Reviewed scheduled/upcoming provider appointments including 10-18-2021 at 0900 am; ?Social Work referral  for ongoing support and education; ?Discussed plans with patient for ongoing care management follow up and provided patient with direct contact information for care management team; ?Screening for signs and symptoms of depression related to chronic disease state;  ?Assessed social determinant of health barriers;   ?  ?Hyperlipidemia:  (Status: Goal on Track (progressing): YES.) Long Term Goal  ?     ?Lab Results  ?Component Value Date  ?  CHOL 127 10/12/2020  ?  HDL 46 10/12/2020  ?  [place] 59 10/12/2020  ?  TRIG 134 10/12/2020  ?  CHOLHDL 2.8 10/12/2020  ?Will have new blood work in September   ?  ?Medication review performed; medication list updated in electronic medical record. 06-17-2021: the patient takes Lipitor 40 mg QD. The patient is compliant with medications. Denies any changes in his cholesterol medications.  ?Provider established cholesterol goals reviewed. 02-25-2021: The patient is at goal with his readings. Praised for effective management; ?Counseled on importance of regular laboratory monitoring as prescribed. 04-22-2021: The patient has regular lab work done. Next lipid panel for September 2023; ?Provided HLD educational materials; ?Reviewed role and benefits of statin for ASCVD risk reduction; ?Discussed strategies to manage statin-induced myalgias; ?Reviewed importance of limiting foods high in cholesterol. 06-17-2021: Is compliant with heart healthy/ADA diet  ?  ?Hypertension: (Status: Goal on Track (progressing): YES.) ?Last practice recorded BP readings:  ?   ?BP Readings from Last 3 Encounters:  ?06/12/21 122/60  ?03/20/21 (!) 112/49  ?01/09/21 (!) 106/49  ?Most recent eGFR/CrCl:  ?     ?Lab Results  ?Component Value Date  ?  EGFR 48 (L) 03/14/2021  ?  No components found for: CRCL ?  ?Evaluation of current treatment plan related to hypertension self management and patient's adherence to plan as established by provider. 06-17-2021: Denies any issues with HTN or Heart healthy. The patient has always had low diastolic readings. States this is nothing usual for him. Denies any light headedness, dizziness, or headaches. Blood pressures are stable. He denies any acute distress. Will continue to monitor for changes. Was in to see the provider recently due to sternal pain. Denies any concerns with blood  pressures;   ?Provided education to patient re: stroke prevention, s/s of heart attack and stroke; ?Reviewed prescribed diet heart healthy/ADA diet. 06-17-2021: The patient is compliant with heart healthy/ADA diet  ?Reviewed medications with patient and discussed importance of compliance. 06-17-2021: Is compliant with medications. Denies any medication needs.  ?Discussed plans with patient for ongoing care management follow up and provided patient with direct contact information for care management team; ?Advised patient, providing education and rationale, to monitor blood pressure daily and record, calling PCP for findings outside established parameters;  ?Advised patient to discuss blood pressure trends, any onset of light headed or dizziness, new concerns with provider; ?Provided education on prescribed diet heart healthy/ADA diet ;  ?Discussed complications of poorly controlled blood pressure such as heart disease, stroke, circulatory complications, vision complications, kidney impairment, sexual dysfunction;  ?  ?  ?Pain:  (Status: New goal. Goal on Track (progressing): YES.) Long Term Goal  ?Pain assessment performed. 06-17-2021: The patient denies sternal pain today. States it comes and goes and when it is there it is about a 5 or 6. He states they told him there were no acute findings and states he has Chondritis.  ?Medications reviewed ?Reviewed provider established plan for pain management. 06-17-2021: Education provided to the patient that if he has changes in level or intensity  of pain or the pain is worse and not better to call the office. The patient verbalized understanding; ?Discussed importance of adherence to all scheduled medical appointments; ?Counseled on the importance of reporting any/all new or changed pain symptoms or management strategies to pain management provider; ?Advised patient to report to care team affect of pain on daily activities; ?Discussed use of relaxation techniques and/or  diversional activities to assist with pain reduction (distraction, imagery, relaxation, massage, acupressure, TENS, heat, and cold application; ?Reviewed with patient prescribed pharmacological and nonpharmacological pain reli

## 2021-06-17 NOTE — Chronic Care Management (AMB) (Signed)
?Chronic Care Management  ? ?CCM RN Visit Note ? ?06/17/2021 ?Name: Charles York MRN: [id number] DOB: 1930-12-30 ? ?Subjective: ?Charles York is a 88 y.o. year old male who is a primary care patient of [name], DO. The care management team was consulted for assistance with disease management and care coordination needs.   ? ?Engaged with patient by telephone for follow up visit in response to provider referral for case management and/or care coordination services.  ? ?Consent to Services:  ?The patient was given information about Chronic Care Management services, agreed to services, and gave verbal consent prior to initiation of services.  Please see initial visit note for detailed documentation.  ? ?Patient agreed to services and verbal consent obtained.  ? ?Assessment: Review of patient past medical history, allergies, medications, health status, including review of consultants reports, laboratory and other test data, was performed as part of comprehensive evaluation and provision of chronic care management services.  ? ?SDOH (Social Determinants of Health) assessments and interventions performed:   ? ?CCM Care Plan ? ?Allergies  ?Allergen Reactions  ? Trulicity [Dulaglutide] Swelling  ?  Swollen lips, shortness of breath,   ? Buspirone   ?  Dizziness  ? Penicillins Itching and Rash  ?  Has patient had a PCN reaction causing immediate rash, facial/tongue/throat swelling, SOB or lightheadedness with hypotension: No ?Has patient had a PCN reaction causing severe rash involving mucus membranes or skin necrosis: No ?Has patient had a PCN reaction that required hospitalization: No ?Has patient had a PCN reaction occurring within the last 10 years: No ?If all of the above answers are "NO", then may proceed with Cephalosporin use. ?  ? ? ?Outpatient Encounter Medications as of 06/17/2021  ?Medication Sig Note  ? aspirin 325 MG EC tablet TK 1 T PO QD FOR UP TO 90 DAYS THEN CAN REDUCE TO 81 MG DAILY   ?  atorvastatin (LIPITOR) 40 MG tablet Take 1 tablet (40 mg total) by mouth at bedtime.   ? atropine 1 % ophthalmic solution INSTILL 1 DROP IN RIGHT EYE TWO TIMES A DAY SURGERY 07/18/19 (Patient not taking: Reported on 06/12/2021)   ? Cholecalciferol 25 MCG (1000 UT) tablet TAKE TWO TABLETS BY MOUTH EVERY DAY AS NEEDED   ? ferrous sulfate 325 (65 FE) MG tablet Take 325 mg by mouth every other day.   ? hydroxypropyl methylcellulose / hypromellose (ISOPTO TEARS / GONIOVISC) 2.5 % ophthalmic solution Place 1 drop into both eyes 4 (four) times daily. (Patient not taking: Reported on 06/12/2021)   ? insulin aspart protamine - aspart (NOVOLOG 70/30 MIX) (70-30) 100 UNIT/ML FlexPen Inject 12 Units into the skin 2 (two) times daily. In the morning and evening   ? ipratropium (ATROVENT) 0.06 % nasal spray USE 2 SPRAYS IN EACH NOSTRIL FOUR TIMES DAILY FOR UP TO 5 TO 7 DAYS THEN STOP 12/29/2018: Dropped to twice daily due to excessive dryness.  ? LANTUS 100 UNIT/ML injection INJECT 27 UNITS SUBCUTANEOUSLY BEFORE BREAKFAST   ? losartan (COZAAR) 25 MG tablet Take 25 mg by mouth daily.   ? omeprazole (PRILOSEC) 20 MG capsule TAKE 1 CAPSULE BY MOUTH TWICE A DAY BEFORE A MEAL   ? polyethylene glycol powder (GLYCOLAX/MIRALAX) 17 GM/SCOOP powder Take 17-34 g by mouth 2 (two) times daily as needed for mild constipation or moderate constipation. (Patient not taking: Reported on 06/12/2021)   ? senna (SENOKOT) 8.6 MG tablet Take 1 tablet by mouth as needed for constipation.   ?  sodium chloride (MURO 128) 5 % ophthalmic solution INSTILL 1 DROP IN [facility] EYE FOUR TIMES A DAY   ? ?No facility-administered encounter medications on file as of 06/17/2021.  ? ? ?Patient Active Problem List  ? Diagnosis Date Noted  ? Overweight with body mass index (BMI) of 27 to 27.9 in adult 01/09/2021  ? Major depression, recurrent, full remission ([place]) 10/12/2019  ? GAD (generalized anxiety disorder) 10/12/2019  ? Chronic right shoulder pain 08/17/2018  ? Absolute  anemia 07/30/2018  ? Primary osteoarthritis involving multiple joints 06/15/2018  ? GERD (gastroesophageal reflux disease) 11/18/2016  ? Diabetic retinopathy ([place]) 11/18/2016  ? Hyperlipidemia associated with type 2 diabetes mellitus ([place]) 11/18/2016  ? Daytime sleepiness 07/15/2016  ? Fatigue 07/15/2016  ? Presbycusis of both ears 07/15/2016  ? Tinnitus of right ear 07/15/2016  ? Allergic rhinitis due to allergen 06/09/2016  ? CKD (chronic kidney disease), stage III ([place]) 02/27/2016  ? BPPV (benign paroxysmal positional vertigo), left 02/27/2016  ? Chronic pansinusitis 02/27/2016  ? Arthritis 07/03/2015  ? Colon polyp 07/03/2015  ? Benign hypertension with CKD (chronic kidney disease) stage III ([place]) 07/03/2015  ? High cholesterol 07/03/2015  ? Type 2 diabetes, controlled, with neuropathy ([place]) 07/03/2015  ? ? ?Conditions to be addressed/monitored:HTN, HLD, DMII, Anxiety, Depression, and pain ? ?Care Plan : RNCM: General Plan of Care (Adult) for Chronic Disease Management and Care Coordination Needs  ?Updates made by Vanita Ingles, RN since 06/17/2021 12:00 AM  ?  ? ?Problem: RNCM: Development of Plan of Care for Chronic Disease Management (HTN, HLD, DM2, Depression, Anxiety)   ?Priority: High  ?  ? ?Goal: RNCM: Effective Management of Plan of Care for Chronic Disease Management (HTN, HLD, DM, Depression, Anxiety)   ?Start Date: 12/24/2020  ?Expected End Date: 12/24/2021  ?Priority: High  ?Note:   ?Current Barriers:  ?Knowledge Deficits related to plan of care for management of HTN, HLD, DMII, and Anxiety with Excessive Worry, and Depression: depressed mood ?anxiety  ?Care Coordination needs related to Limited social support, Mental Health Concerns , and Social Isolation  ?Chronic Disease Management support and education needs related to HTN, HLD, DMII, and Anxiety with Excessive Worry, and Depression: depressed mood ?anxiety ? ?RNCM Clinical Goal(s):  ?Patient will verbalize basic understanding of HTN, HLD, DMII,  Anxiety, and Depression disease process and self health management plan as evidenced by following the plan of care, taking medications, monitoring and following dietary restrictions and working with the CCM team to effectively manage health and well being ?take all medications exactly as prescribed and will call provider for medication related questions as evidenced by compliance and calling for refills before running out of medications     ?attend all scheduled medical appointments: 10-18-2021 as evidenced by keeping appointments and call to reschedule appointments if needed         ?continue to work with [facility] and/or Social Worker to address care management and care coordination needs related to HTN, HLD, DMII, Anxiety, and Depression as evidenced by adherence to CM Team Scheduled appointments     ?demonstrate a decrease in HTN, HLD, DMII, Anxiety, and Depression exacerbations  as evidenced by working with the CCM team to optimize plan of care for chronic conditions ?demonstrate ongoing self health care management ability to effectively manage chronic conditions  as evidenced by working with the CCM team through collaboration with Consulting civil engineer, provider, and care team.  ? ?Interventions: ?1:1 collaboration with primary care provider regarding development and  update of comprehensive plan of care as evidenced by provider attestation and co-signature ?Inter-disciplinary care team collaboration (see longitudinal plan of care) ?Evaluation of current treatment plan related to  self management and patient's adherence to plan as established by provider ? ? ?Diabetes:  (Status: Goal on Track (progressing): YES.) Long Term Goal  ? ?Lab Results  ?Component Value Date  ? HGBA1C 7.5 (H) 03/14/2021  ?Previous on 10-12-2020 was 7.2% ?Assessed patient's understanding of A1c goal: <7% ?Provided education to patient about basic DM disease process; ?Reviewed medications with patient and discussed importance of medication  adherence. 06-17-2021:Is compliant with medications. Denies any new medication needs today;        ?Reviewed prescribed diet with patient heart healthy/ADA. 06-17-2021: The patient is compliant with heart heal

## 2021-06-18 ENCOUNTER — Ambulatory Visit: Payer: Self-pay | Admitting: *Deleted

## 2021-06-18 NOTE — Telephone Encounter (Signed)
?  Chief Complaint: blood with stool ?Symptoms: blood when wipes and mixed stool x2 ?Frequency: started yesterday evening ?Pertinent Negatives: Patient denies abdomen pain, vomiting, dizziness, fever ?Disposition: '[]'$ ED /'[]'$ Urgent Care (no appt availability in office) / '[x]'$ Appointment(In office/virtual)/ '[]'$  [facility]/ '[]'$ Home Care/ '[]'$ Refused Recommended Disposition /'[]'$ [place]/ '[]'$  Follow-up with PCP ?Additional Notes:  Patient advised call back with symptoms- gets worse  ?

## 2021-06-18 NOTE — Telephone Encounter (Signed)
Reason for Disposition ? MILD rectal bleeding (more than just a few drops or streaks) ? ?Answer Assessment - Initial Assessment Questions ?1. APPEARANCE of BLOOD: "What color is it?" "Is it passed separately, on the surface of the stool, or mixed in with the stool?"  ?    Last night and this morning-bright red to deep red-mixed ?2. AMOUNT: "How much blood was passed?"  ?    When wipes and blood in stool ?3. FREQUENCY: "How many times has blood been passed with the stools?"  ?    twice ?4. ONSET: "When was the blood first seen in the stools?" (Days or weeks)  ?    yesterday ?5. DIARRHEA: "Is there also some diarrhea?" If Yes, ask: "How many diarrhea stools in the past 24 hours?"  ?    no ?6. CONSTIPATION: "Do you have constipation?" If Yes, ask: "How bad is it?" ?    Off/on- no ?7. RECURRENT SYMPTOMS: "Have you had blood in your stools before?" If Yes, ask: "When was the last time?" and "What happened that time?"  ?    no ?8. BLOOD THINNERS: "Do you take any blood thinners?" (e.g., Coumadin/warfarin, Pradaxa/dabigatran, aspirin) ?    Aspirin 325 ?9. OTHER SYMPTOMS: "Do you have any other symptoms?"  (e.g., abdomen pain, vomiting, dizziness, fever) ?    Sternal pain ?10. PREGNANCY: "Is there any chance you are pregnant?" "When was your last menstrual period?" ? ?Protocols used: Rectal Bleeding-A-AH ? ?

## 2021-06-19 ENCOUNTER — Ambulatory Visit: Payer: Medicare Other | Admitting: Family Medicine

## 2021-07-03 DIAGNOSIS — Z87891 Personal history of nicotine dependence: Secondary | ICD-10-CM | POA: Diagnosis not present

## 2021-07-03 DIAGNOSIS — E785 Hyperlipidemia, unspecified: Secondary | ICD-10-CM

## 2021-07-03 DIAGNOSIS — F32A Depression, unspecified: Secondary | ICD-10-CM | POA: Diagnosis not present

## 2021-07-03 DIAGNOSIS — I10 Essential (primary) hypertension: Secondary | ICD-10-CM | POA: Diagnosis not present

## 2021-07-03 DIAGNOSIS — Z794 Long term (current) use of insulin: Secondary | ICD-10-CM

## 2021-07-03 DIAGNOSIS — E1169 Type 2 diabetes mellitus with other specified complication: Secondary | ICD-10-CM | POA: Diagnosis not present

## 2021-07-11 ENCOUNTER — Other Ambulatory Visit: Payer: Self-pay

## 2021-07-11 ENCOUNTER — Emergency Department: Payer: Medicare Other

## 2021-07-11 ENCOUNTER — Observation Stay
Admission: EM | Admit: 2021-07-11 | Discharge: 2021-07-12 | Disposition: A | Discharge status: Home / Self Care | Payer: Medicare Other | Attending: Osteopathic Medicine | Admitting: Osteopathic Medicine

## 2021-07-11 ENCOUNTER — Ambulatory Visit: Payer: Self-pay | Admitting: *Deleted

## 2021-07-11 ENCOUNTER — Observation Stay
Admit: 2021-07-11 | Discharge: 2021-07-11 | Disposition: A | Discharge status: Home / Self Care | Payer: Medicare Other | Attending: Neurology | Admitting: Neurology

## 2021-07-11 ENCOUNTER — Observation Stay: Payer: Medicare Other

## 2021-07-11 DIAGNOSIS — E1140 Type 2 diabetes mellitus with diabetic neuropathy, unspecified: Secondary | ICD-10-CM | POA: Diagnosis not present

## 2021-07-11 DIAGNOSIS — Z7982 Long term (current) use of aspirin: Secondary | ICD-10-CM | POA: Insufficient documentation

## 2021-07-11 DIAGNOSIS — R202 Paresthesia of skin: Secondary | ICD-10-CM

## 2021-07-11 DIAGNOSIS — K219 Gastro-esophageal reflux disease without esophagitis: Secondary | ICD-10-CM | POA: Diagnosis present

## 2021-07-11 DIAGNOSIS — I639 Cerebral infarction, unspecified: Secondary | ICD-10-CM

## 2021-07-11 DIAGNOSIS — I10 Essential (primary) hypertension: Secondary | ICD-10-CM | POA: Diagnosis present

## 2021-07-11 DIAGNOSIS — M6281 Muscle weakness (generalized): Secondary | ICD-10-CM | POA: Diagnosis not present

## 2021-07-11 DIAGNOSIS — Z79899 Other long term (current) drug therapy: Secondary | ICD-10-CM | POA: Diagnosis not present

## 2021-07-11 DIAGNOSIS — R531 Weakness: Secondary | ICD-10-CM | POA: Diagnosis not present

## 2021-07-11 DIAGNOSIS — I129 Hypertensive chronic kidney disease with stage 1 through stage 4 chronic kidney disease, or unspecified chronic kidney disease: Secondary | ICD-10-CM | POA: Diagnosis not present

## 2021-07-11 DIAGNOSIS — I6359 Cerebral infarction due to unspecified occlusion or stenosis of other cerebral artery: Secondary | ICD-10-CM | POA: Diagnosis not present

## 2021-07-11 DIAGNOSIS — Z794 Long term (current) use of insulin: Secondary | ICD-10-CM | POA: Insufficient documentation

## 2021-07-11 DIAGNOSIS — N183 Chronic kidney disease, stage 3 unspecified: Secondary | ICD-10-CM | POA: Diagnosis not present

## 2021-07-11 DIAGNOSIS — Z87891 Personal history of nicotine dependence: Secondary | ICD-10-CM | POA: Diagnosis not present

## 2021-07-11 DIAGNOSIS — Z85828 Personal history of other malignant neoplasm of skin: Secondary | ICD-10-CM | POA: Diagnosis not present

## 2021-07-11 DIAGNOSIS — R29810 Facial weakness: Secondary | ICD-10-CM | POA: Diagnosis not present

## 2021-07-11 DIAGNOSIS — Z8673 Personal history of transient ischemic attack (TIA), and cerebral infarction without residual deficits: Secondary | ICD-10-CM | POA: Insufficient documentation

## 2021-07-11 DIAGNOSIS — R29818 Other symptoms and signs involving the nervous system: Secondary | ICD-10-CM | POA: Diagnosis not present

## 2021-07-11 DIAGNOSIS — R200 Anesthesia of skin: Secondary | ICD-10-CM | POA: Diagnosis not present

## 2021-07-11 DIAGNOSIS — I6522 Occlusion and stenosis of left carotid artery: Secondary | ICD-10-CM | POA: Diagnosis not present

## 2021-07-11 DIAGNOSIS — G459 Transient cerebral ischemic attack, unspecified: Secondary | ICD-10-CM | POA: Diagnosis not present

## 2021-07-11 DIAGNOSIS — I6502 Occlusion and stenosis of left vertebral artery: Secondary | ICD-10-CM | POA: Diagnosis not present

## 2021-07-11 DIAGNOSIS — R29898 Other symptoms and signs involving the musculoskeletal system: Secondary | ICD-10-CM | POA: Diagnosis not present

## 2021-07-11 LAB — CBC
HCT: 37.3 % — ABNORMAL LOW (ref 39.0–52.0)
Hemoglobin: 12.2 g/dL — ABNORMAL LOW (ref 13.0–17.0)
MCH: 32 pg (ref 26.0–34.0)
MCHC: 32.7 g/dL (ref 30.0–36.0)
MCV: 97.9 fL (ref 80.0–100.0)
Platelets: 226 10*3/uL (ref 150–400)
RBC: 3.81 MIL/uL — ABNORMAL LOW (ref 4.22–5.81)
RDW: 13 % (ref 11.5–15.5)
WBC: 8.3 10*3/uL (ref 4.0–10.5)
nRBC: 0 % (ref 0.0–0.2)

## 2021-07-11 LAB — COMPREHENSIVE METABOLIC PANEL WITH GFR
ALT: 20 U/L (ref 0–44)
AST: 17 U/L (ref 15–41)
Albumin: 4 g/dL (ref 3.5–5.0)
Alkaline Phosphatase: 90 U/L (ref 38–126)
Anion gap: 5 (ref 5–15)
BUN: 35 mg/dL — ABNORMAL HIGH (ref 8–23)
CO2: 24 mmol/L (ref 22–32)
Calcium: 8.6 mg/dL — ABNORMAL LOW (ref 8.9–10.3)
Chloride: 112 mmol/L — ABNORMAL HIGH (ref 98–111)
Creatinine, Ser: 1.42 mg/dL — ABNORMAL HIGH (ref 0.61–1.24)
GFR, Estimated: 47 mL/min — ABNORMAL LOW (ref >60)
Glucose, Bld: 265 mg/dL — ABNORMAL HIGH (ref 70–99)
Potassium: 4.7 mmol/L (ref 3.5–5.1)
Sodium: 141 mmol/L (ref 135–145)
Total Bilirubin: 0.6 mg/dL (ref 0.3–1.2)
Total Protein: 7 g/dL (ref 6.5–8.1)

## 2021-07-11 LAB — HEMOGLOBIN A1C
Hgb A1c MFr Bld: 7.3 % — ABNORMAL HIGH (ref 4.8–5.6)
Mean Plasma Glucose: 162.81 mg/dL

## 2021-07-11 LAB — ECHOCARDIOGRAM COMPLETE BUBBLE STUDY
AR max vel: 2.23 cm2
AV Area VTI: 2.61 cm2
AV Area mean vel: 2.22 cm2
AV Mean grad: 2 mmHg
AV Peak grad: 3.5 mmHg
Ao pk vel: 0.94 m/s
Area-P 1/2: 4.17 cm2
MV VTI: 1.93 cm2
S' Lateral: 2.4 cm

## 2021-07-11 LAB — DIFFERENTIAL
Abs Immature Granulocytes: 0.02 10*3/uL (ref 0.00–0.07)
Basophils Absolute: 0.1 10*3/uL (ref 0.0–0.1)
Basophils Relative: 1 %
Eosinophils Absolute: 0.1 10*3/uL (ref 0.0–0.5)
Eosinophils Relative: 1 %
Immature Granulocytes: 0 %
Lymphocytes Relative: 37 %
Lymphs Abs: 3.1 10*3/uL (ref 0.7–4.0)
Monocytes Absolute: 0.5 10*3/uL (ref 0.1–1.0)
Monocytes Relative: 6 %
Neutro Abs: 4.5 10*3/uL (ref 1.7–7.7)
Neutrophils Relative %: 55 %

## 2021-07-11 LAB — APTT: aPTT: 32 seconds (ref 24–36)

## 2021-07-11 LAB — LDL CHOLESTEROL, DIRECT: Direct LDL: 60.9 mg/dL (ref 0–99)

## 2021-07-11 LAB — GLUCOSE, CAPILLARY
Glucose-Capillary: 157 mg/dL — ABNORMAL HIGH (ref 70–99)
Glucose-Capillary: 128 mg/dL — ABNORMAL HIGH (ref 70–99)

## 2021-07-11 LAB — PROTIME-INR
INR: 1 (ref 0.8–1.2)
Prothrombin Time: 13.2 seconds (ref 11.4–15.2)

## 2021-07-11 MED ORDER — ASPIRIN 81 MG PO CHEW
81.0000 mg | CHEWABLE_TABLET | Freq: Every day | ORAL | Status: DC
  Administered 2021-07-12: 81 mg via ORAL
  Filled 2021-07-11: qty 1

## 2021-07-11 MED ORDER — PANTOPRAZOLE SODIUM 40 MG PO TBEC
40.0000 mg | DELAYED_RELEASE_TABLET | Freq: Every day | ORAL | Status: DC
  Administered 2021-07-11 – 2021-07-12 (×2): 40 mg via ORAL
  Filled 2021-07-11 (×2): qty 1

## 2021-07-11 MED ORDER — INSULIN GLARGINE-YFGN 100 UNIT/ML ~~LOC~~ SOLN
20.0000 [IU] | Freq: Every day | SUBCUTANEOUS | Status: DC
  Administered 2021-07-12: 20 [IU] via SUBCUTANEOUS
  Filled 2021-07-11: qty 0.2

## 2021-07-11 MED ORDER — INSULIN ASPART 100 UNIT/ML IJ SOLN
0.0000 [IU] | Freq: Three times a day (TID) | INTRAMUSCULAR | Status: DC
  Administered 2021-07-11 – 2021-07-12 (×2): 3 [IU] via SUBCUTANEOUS
  Administered 2021-07-12: 2 [IU] via SUBCUTANEOUS
  Administered 2021-07-12: 3 [IU] via SUBCUTANEOUS
  Filled 2021-07-11 (×4): qty 1

## 2021-07-11 MED ORDER — ACETAMINOPHEN 650 MG RE SUPP: 650.0000 mg | RECTAL | Status: DC | PRN

## 2021-07-11 MED ORDER — STROKE: EARLY STAGES OF RECOVERY BOOK: Freq: Once | Status: AC

## 2021-07-11 MED ORDER — FERROUS SULFATE 325 (65 FE) MG PO TABS
325.0000 mg | ORAL_TABLET | ORAL | Status: DC
  Administered 2021-07-11: 325 mg via ORAL
  Filled 2021-07-11: qty 1

## 2021-07-11 MED ORDER — SODIUM CHLORIDE 0.9 % IV SOLN: INTRAVENOUS | Status: DC

## 2021-07-11 MED ORDER — SUCRALFATE 1 G PO TABS
1.0000 g | ORAL_TABLET | Freq: Three times a day (TID) | ORAL | Status: DC
  Administered 2021-07-11 – 2021-07-12 (×6): 1 g via ORAL
  Filled 2021-07-11 (×5): qty 1

## 2021-07-11 MED ORDER — CLOPIDOGREL BISULFATE 75 MG PO TABS
75.0000 mg | ORAL_TABLET | Freq: Every day | ORAL | Status: DC
  Administered 2021-07-11 – 2021-07-12 (×2): 75 mg via ORAL
  Filled 2021-07-11 (×2): qty 1

## 2021-07-11 MED ORDER — SODIUM CHLORIDE 0.9% FLUSH: 3.0000 mL | Freq: Once | INTRAVENOUS | Status: DC

## 2021-07-11 MED ORDER — ACETAMINOPHEN 160 MG/5ML PO SOLN: 650.0000 mg | ORAL | Status: DC | PRN

## 2021-07-11 MED ORDER — ACETAMINOPHEN 325 MG PO TABS: 650.0000 mg | ORAL_TABLET | ORAL | Status: DC | PRN

## 2021-07-11 MED ORDER — SENNOSIDES-DOCUSATE SODIUM 8.6-50 MG PO TABS: 1.0000 | ORAL_TABLET | Freq: Every evening | ORAL | Status: DC | PRN

## 2021-07-11 MED ORDER — ATORVASTATIN CALCIUM 20 MG PO TABS
40.0000 mg | ORAL_TABLET | Freq: Every day | ORAL | Status: DC
  Administered 2021-07-11: 40 mg via ORAL
  Filled 2021-07-11: qty 2

## 2021-07-11 MED ORDER — POLYVINYL ALCOHOL 1.4 % OP SOLN
1.0000 [drp] | Freq: Four times a day (QID) | OPHTHALMIC | Status: DC | PRN
Start: 2021-07-11 — End: 2021-07-12

## 2021-07-11 NOTE — Assessment & Plan Note (Addendum)
Patient presents for evaluation of left facial numbness as well as numbness involving the left upper extremity with weakness. Symptoms were improved at time of admission but not completely resolved  MRI brain and MRA head/neck --> no concerns  Echocardiogram --> no concerns  ST --> no concerns  PT --> home health PT, TOC consulted   OT pending  Allow for permissive hypertension until an acute stroke is ruled out --> can resume home antihypertensives  ASA, high potency statin, Plavix per neuro recs --> ASA '81mg'$  daily + plavix '75mg'$  daily x21 days f/b plavix '75mg'$  daily monotherapy after that  Neurology to follow outpatient  A1C: 7.3  LDL: 45

## 2021-07-11 NOTE — Telephone Encounter (Signed)
Reason for Disposition  Unable to walk, or can only walk with assistance (e.g., requires support)    History of mini stroke a year ago.   Very dizzy  Answer Assessment - Initial Assessment Questions 1. LOCATION: "Where does it hurt?"      Pt having a bad headache, stomach upset, diarrhea, BP 142/80. 2. ONSET: "When did the headache start?" (Minutes, hours or days)      Yesterday started.   I was walking and my legs got weak and I thought I was going to pass out.   I was able to walk back home.    No chest pain or shortness of breath.   No fever.     Yesterday the diarrhea started and having it this morning.  Having headache in my forehead.   My nose runs all the time.   I have allergies.   I take medicine for it.   Not running a fever.   No sore throat. 3. PATTERN: "Does the pain come and go, or has it been constant since it started?"     Constantly 4. SEVERITY: "How bad is the pain?" and "What does it keep you from doing?"  (e.g., Scale 1-10; mild, moderate, or severe)   - MILD (1-3): doesn't interfere with normal activities    - MODERATE (4-7): interferes with normal activities or awakens from sleep    - SEVERE (8-10): excruciating pain, unable to do any normal activities        No cardiac history Last 6 months I don't eat much.   My appetite is poor.   I use to be a big eater.  Dr. Parks Ranger.     5. RECURRENT SYMPTOM: "Have you ever had headaches before?" If Yes, ask: "When was the last time?" and "What happened that time?"      Not like this.   I took Tylenol and it has not helped. 6. CAUSE: "What do you think is causing the headache?"     I'm very dizzy.   I thought I was going to pass out yesterday.   I'm walking slow this morning.   I had to sit back down this morning because I was so dizzy.   7. MIGRAINE: "Have you been diagnosed with migraine headaches?" If Yes, ask: "Is this headache similar?"      No No stroke history 8. HEAD INJURY: "Has there been any recent injury to the  head?"      No injuries or accidents. I went to ED a year ago and they said I had a mini stroke.   Left side of face was numb and left arm too.   I was started on aspirin every morning.    9. OTHER SYMPTOMS: "Do you have any other symptoms?" (fever, stiff neck, eye pain, sore throat, cold symptoms)     See above 10. PREGNANCY: "Is there any chance you are pregnant?" "When was your last menstrual period?"       N/A  Protocols used: Headache-A-AH

## 2021-07-11 NOTE — Consult Note (Signed)
NEUROLOGY CONSULTATION NOTE   Date of service: July 11, 2021 Patient Name: Charles York MRN:  [id number] DOB:  1930-05-15 Reason for consult: stroke like sx Requesting physician: Dr. Royce Macadamia Agbata _ _ _   _ __   _ __ _ _  __ __   _ __   __ _  History of Present Illness   This is a 88 yo man with hx DM2, HTN, HL, hx TIA who presented to ED for evaluation of L face and arm numbness and subtle weakness since 1030 the day before. Yesterday the sx began suddenly when walking to the mailbox and he also developed a headache. Sx improved but did not completely resolve so he came to ED today. He had same sx last year which completely resolved and he was diagnosed with a TIA at that time. He took ASA '81mg'$  daily PTA.    ROS   Per HPI: all other systems reviewed and are negative  Past History   I have reviewed the following:  Past Medical History:  Diagnosis Date   Anxiety    Arthritis    Cancer ([place])    skin   Chronic kidney disease    stage 3   Colon polyp    Diabetes mellitus without complication ([place])    GERD (gastroesophageal reflux disease)    Heartburn    Hyperlipemia    Hypertension    Obesity    Past Surgical History:  Procedure Laterality Date   CHOLECYSTECTOMY N/A 02/04/2018   Procedure: LAPAROSCOPIC CHOLECYSTECTOMY;  Surgeon: Johnathan Hausen, MD;  Location: [facility] ORS;  Service: General;  Laterality: N/A;   EYE SURGERY  11/2017   fluid in back of eye/ injection every 3 months. 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    Comment: retired  Tobacco Use   Smoking  status: Former    Types: Cigarettes    Quit date: 02/04/1983    Years since quitting: 38.4   Smokeless tobacco: Former  Scientific laboratory technician Use: Never used  Substance and Sexual Activity   Alcohol use: No    Alcohol/week: 0.0 standard drinks of alcohol   Drug use: No   Sexual activity: Not on file  Other Topics Concern   Not on file  Social History Narrative   Not on file   Social Determinants of Health   Financial Resource Strain: [place]  (09/11/2020)   Overall Financial Resource Strain (CARDIA)    Difficulty of Paying Living Expenses: Not hard at all  Food Insecurity: No [place] (09/11/2020)   Hunger Vital Sign    Worried About Running Out of Food in the Last Year: Never true    [place] in the Last Year: Never true  Transportation Needs: No Transportation Needs (09/11/2020)   PRAPARE - Hydrologist (Medical): No    Lack of Transportation (Non-Medical): No  Physical Activity: Insufficiently Active (09/11/2020)   Exercise Vital  Sign    Days of Exercise per Week: 4 days    Minutes of Exercise per Session: 30 min  Stress: No Stress Concern Present (09/11/2020)   [place]    Feeling of Stress : Not at all  Social Connections: Socially Isolated (09/06/2020)   Social Connection and Isolation Panel [NHANES]    Frequency of Communication with Friends and Family: More than three times a week    Frequency of Social Gatherings with Friends and Family: More than three times a week    Attends Religious Services: Never    Marine scientist or Organizations: No    Attends Archivist Meetings: Never    Marital Status: Widowed   Allergies  Allergen Reactions   Trulicity [Dulaglutide] Swelling    Swollen lips, shortness of breath,    Buspirone     Dizziness   Penicillins Itching and Rash    Has patient had a PCN reaction causing immediate rash, facial/tongue/throat  swelling, SOB or lightheadedness with hypotension: No Has patient had a PCN reaction causing severe rash involving mucus membranes or skin necrosis: No Has patient had a PCN reaction that required hospitalization: No Has patient had a PCN reaction occurring within the last 10 years: No If all of the above answers are "NO", then may proceed with Cephalosporin use.     Medications   Medications Prior to Admission  Medication Sig Dispense Refill Last Dose   aspirin 325 MG EC tablet TK 1 T PO QD FOR UP TO 90 DAYS THEN CAN REDUCE TO 81 MG DAILY   07/10/2021   atorvastatin (LIPITOR) 40 MG tablet Take 1 tablet (40 mg total) by mouth at bedtime. 90 tablet 3 07/10/2021   Cholecalciferol 25 MCG (1000 UT) tablet TAKE TWO TABLETS BY MOUTH EVERY DAY AS NEEDED   Past Month   ferrous sulfate 325 (65 FE) MG tablet Take 325 mg by mouth every other day.   Past Month   fluticasone (FLONASE) 50 MCG/ACT nasal spray Place into both nostrils daily.   PRN   hydroxypropyl methylcellulose / hypromellose (ISOPTO TEARS / GONIOVISC) 2.5 % ophthalmic solution Place 1 drop into both eyes 4 (four) times daily.   07/10/2021   insulin aspart (NOVOLOG) 100 UNIT/ML injection Inject 12 Units into the skin in the morning and at bedtime. Inject 12 units in the morning, Inject 12 units in the evening   07/10/2021 at 1700   LANTUS 100 UNIT/ML injection INJECT 27 UNITS SUBCUTANEOUSLY BEFORE BREAKFAST (Patient taking differently: 28 Units in the morning.) 10 mL 3 07/10/2021   losartan (COZAAR) 25 MG tablet Take 12.5 mg by mouth daily.   07/10/2021   omeprazole (PRILOSEC) 20 MG capsule TAKE 1 CAPSULE BY MOUTH TWICE A DAY BEFORE A MEAL 180 capsule 1 07/10/2021   senna (SENOKOT) 8.6 MG tablet Take 1 tablet by mouth as needed for constipation.   PRN   sucralfate (CARAFATE) 1 g tablet Take 1 g by mouth 4 (four) times daily -  with meals and at bedtime.   07/10/2021   atropine 1 % ophthalmic solution INSTILL 1 DROP IN RIGHT EYE TWO TIMES A DAY SURGERY  07/18/19 (Patient not taking: Reported on 06/12/2021)      ipratropium (ATROVENT) 0.06 % nasal spray USE 2 SPRAYS IN EACH NOSTRIL FOUR TIMES DAILY FOR UP TO 5 TO 7 DAYS THEN STOP 15 mL 2    polyethylene glycol powder (GLYCOLAX/MIRALAX) 17 GM/SCOOP powder Take  17-34 g by mouth 2 (two) times daily as needed for mild constipation or moderate constipation. (Patient not taking: Reported on 06/12/2021) 255 g 1    sodium chloride (MURO 128) 5 % ophthalmic solution INSTILL 1 DROP IN EACH EYE FOUR TIMES A DAY         Current Facility-Administered Medications:    0.9 %  sodium chloride infusion, , Intravenous, Continuous, Agbata, Tochukwu, MD   acetaminophen (TYLENOL) tablet 650 mg, 650 mg, Oral, Q4H PRN **OR** acetaminophen (TYLENOL) 160 MG/5ML solution 650 mg, 650 mg, Per Tube, Q4H PRN **OR** acetaminophen (TYLENOL) suppository 650 mg, 650 mg, Rectal, Q4H PRN, Agbata, Tochukwu, MD   [START ON 07/12/2021] aspirin chewable tablet 81 mg, 81 mg, Oral, Daily, Derek Jack, MD   atorvastatin (LIPITOR) tablet 40 mg, 40 mg, Oral, QHS, Agbata, Tochukwu, MD   clopidogrel (PLAVIX) tablet 75 mg, 75 mg, Oral, Daily, Derek Jack, MD, 75 mg at 07/11/21 1134   ferrous sulfate tablet 325 mg, 325 mg, Oral, QODAY, Agbata, Tochukwu, MD, 325 mg at 07/11/21 1252   insulin aspart (novoLOG) injection 0-15 Units, 0-15 Units, Subcutaneous, TID WC, Agbata, Tochukwu, MD, 3 Units at 07/11/21 1747   [START ON 07/12/2021] insulin glargine-yfgn (SEMGLEE) injection 20 Units, 20 Units, Subcutaneous, Daily, Agbata, Tochukwu, MD   pantoprazole (PROTONIX) EC tablet 40 mg, 40 mg, Oral, Daily, Agbata, Tochukwu, MD, 40 mg at 07/11/21 1252   polyvinyl alcohol (LIQUIFILM TEARS) 1.4 % ophthalmic solution 1 drop, 1 drop, Both Eyes, QID PRN, Agbata, Tochukwu, MD   senna-docusate (Senokot-S) tablet 1 tablet, 1 tablet, Oral, QHS PRN, Agbata, Tochukwu, MD   sodium chloride flush (NS) 0.9 % injection 3 mL, 3 mL, Intravenous, Once, Harvest Dark,  MD   sucralfate (CARAFATE) tablet 1 g, 1 g, Oral, TID WC & HS, Agbata, Tochukwu, MD, 1 g at 07/11/21 1747  Vitals   Vitals:   07/11/21 1130 07/11/21 1230 07/11/21 1255 07/11/21 1556  BP: (!) 151/65 137/62 (!) 142/47 (!) 141/60  Pulse: 66 61 68 62  Resp: (!) '21 12 18 18  '$ Temp:   97.7 F (36.5 C) 97.8 F (36.6 C)  TempSrc:   Oral   SpO2: 96% 99% 97% 99%  Height:         Body mass index is 26.58 kg/m.  Physical Exam   Physical Exam Gen: A&O x4, NAD HEENT: Atraumatic, normocephalic;mucous membranes moist; oropharynx clear, tongue without atrophy or fasciculations. Neck: Supple, trachea midline. Resp: CTAB, no w/r/r CV: RRR, no m/g/r; nml S1 and S2. 2+ symmetric peripheral pulses. Abd: soft/NT/ND; nabs x 4 quad Extrem: Nml bulk; no cyanosis, clubbing, or edema.  Neuro: *MS: A&O x4. Follows multi-step commands.  *Speech: fluid, nondysarthric, able to name and repeat *CN:    I: Deferred   II,III: PERRLA, VFF by confrontation, optic discs unable to be visualized 2/2 pupillary constriction   III,IV,VI: EOMI w/o nystagmus, no ptosis   V: Sensation intact from V1 to V3 to LT   VII: Eyelid closure was full.  L NLF flattening   VIII: Hearing intact to voice   IX,X: Voice normal, palate elevates symmetrically    XI: SCM/trap 5/5 bilat   XII: Tongue protrudes midline, no atrophy or fasciculations  *Motor:   Normal bulk.  No tremor, rigidity or bradykinesia. Subtle drift in LUE with 4+/5 delt, 4/5 tricep, 4+/5 grip and otherwise 5/5 throughout. Full strength throughout in RUE and BLE. *Sensory: Impaired to PP LUE, otherwise intact. Propioception intact bilat.  No  double-simultaneous extinction.  *Coordination:  FNF intact bilat *Reflexes:  2+ and symmetric throughout without clonus; toes down-going bilat *Gait: deferred  NIHSS = 3 (facial droop, sensory, LUE drift)   Premorbid mRS = 0   Labs   CBC:  Recent Labs  Lab 07/11/21 0924  WBC 8.3  NEUTROABS 4.5  HGB 12.2*   HCT 37.3*  MCV 97.9  PLT 774    Basic Metabolic Panel:  Lab Results  Component Value Date   NA 141 07/11/2021   K 4.7 07/11/2021   CO2 24 07/11/2021   GLUCOSE 265 (H) 07/11/2021   BUN 35 (H) 07/11/2021   CREATININE 1.42 (H) 07/11/2021   CALCIUM 8.6 (L) 07/11/2021   GFRNONAA 47 (L) 07/11/2021   GFRAA 57 (L) 10/05/2019   Lipid Panel:  Lab Results  Component Value Date   LDLCALC 59 10/12/2020   HgbA1c:  Lab Results  Component Value Date   HGBA1C 7.5 (H) 03/14/2021   Urine Drug Screen: No results found for: "LABOPIA", "COCAINSCRNUR", "LABBENZ", "AMPHETMU", "THCU", "LABBARB"  Alcohol Level No results found for: "ETH"  MRI brain wo contrast: radiology interpreted study as normal, however on repeat personal review he has a punctate region of restricted diffusion in the R pons   Impression   This is a 88 yo man with hx DM2, HTN, HL, hx TIA who presented to ED for evaluation of L face and arm numbness and subtle weakness since 1030 the day before. On brain MRI he has a punctate area of restricted diffusion in the R pons which is favored to represent tiny acute ischemic infarct. His sx localize to this region.  Recommendations   - Permissive HTN x48 hrs from sx onset goal BP <220/110. PRN labetalol or hydralazine if BP above these parameters. Avoid oral antihypertensives. - MRA H&N, evaluate posterior circulation - TTE w/ bubble - Check A1c and LDL + add statin per guidelines - ASA '81mg'$  daily + plavix '75mg'$  daily x21 days f/b plavix '75mg'$  daily monotherapy after that - q4 hr neuro checks - STAT head CT for any change in neuro exam - Tele - PT/OT/SLP - Stroke education - Amb referral to neurology upon discharge   Will continue to follow.  ______________________________________________________________________   Thank you for the opportunity to take part in the care of this patient. If you have any further questions, please contact the neurology consultation  attending.  Signed,  Su Monks, MD [facility] [phone]  If 7pm- 7am, please page neurology on call as listed in [place].

## 2021-07-11 NOTE — H&P (Addendum)
History and Physical    Patient: Charles York:[id number] DOB: Jan 04, 1931 DOA: 07/11/2021 DOS: the patient was seen and examined on 07/11/2021 PCP: [name], DO  Patient coming from: Home  Chief Complaint:  Chief Complaint  Patient presents with   Numbness   Weakness   HPI: Charles York is a 88 y.o. male with medical history significant for diabetes mellitus, GERD, hypertension, dyslipidemia, history of TIA who presents to the ER via private vehicle for evaluation of headache, left arm numbness and weakness as well as left face numbness which started 1 day prior to his admission at about 10:30 AM.  Patient states he was walking to the mailbox and suddenly developed numbness involving the left side of his face and left arm associated with a frontal headache.  He also had lower extremity weakness but denies any falls.  His symptoms have improved but not completely resolved and so he decided to come to the ER for further evaluation. Symptoms are associated with blurred vision but no difficulty swallowing or double vision.  He denies having any chest pain, no shortness of breath, no nausea, no vomiting, no dizziness or lightheadedness, no fever, no chills, no cough, no focal deficit. He had a similar episode about a year ago and was diagnosed with a mini stroke. He has not had any falls but states that his gait has been abnormal and that he will most likely need an assist device for ambulation. He states his appetite has been poor and that he has had a significant amount of weight loss over the last 3 months.  Review of Systems: As mentioned in the history of present illness. All other systems reviewed and are negative. Past Medical History:  Diagnosis Date   Anxiety    Arthritis    Cancer ([place])    skin   Chronic kidney disease    stage 3   Colon polyp    Diabetes mellitus without complication ([place])    GERD (gastroesophageal reflux disease)    Heartburn    Hyperlipemia     Hypertension    Obesity    Past Surgical History:  Procedure Laterality Date   CHOLECYSTECTOMY N/A 02/04/2018   Procedure: LAPAROSCOPIC CHOLECYSTECTOMY;  Surgeon: Johnathan Hausen, MD;  Location: [facility] ORS;  Service: General;  Laterality: N/A;   EYE SURGERY  11/2017   fluid in back of eye/ injection every 3 months. done at [place] Bilateral    cataracts extracted   Social History:  reports that he quit smoking about 38 years ago. His smoking use included cigarettes. He has quit using smokeless tobacco. He reports that he does not drink alcohol and does not use drugs.  Allergies  Allergen Reactions   Trulicity [Dulaglutide] Swelling    Swollen lips, shortness of breath,    Buspirone     Dizziness   Penicillins Itching and Rash    Has patient had a PCN reaction causing immediate rash, facial/tongue/throat swelling, SOB or lightheadedness with hypotension: No Has patient had a PCN reaction causing severe rash involving mucus membranes or skin necrosis: No Has patient had a PCN reaction that required hospitalization: No Has patient had a PCN reaction occurring within the last 10 years: No If all of the above answers are "NO", then may proceed with Cephalosporin use.     Family History  Problem Relation Age of Onset   Cancer Father        carcinoma   Alzheimer's disease Mother  Prior to Admission medications   Medication Sig Start Date End Date Taking? Authorizing Provider  aspirin 325 MG EC tablet TK 1 T PO QD FOR UP TO 90 DAYS THEN CAN REDUCE TO 81 MG DAILY 01/07/19  Yes [provider]  atorvastatin (LIPITOR) 40 MG tablet Take 1 tablet (40 mg total) by mouth at bedtime. 03/21/19  Yes Karamalegos, Devonne Doughty, DO  Cholecalciferol 25 MCG (1000 UT) tablet TAKE TWO TABLETS BY MOUTH EVERY DAY AS NEEDED 08/24/19  Yes [provider]  ferrous sulfate 325 (65 FE) MG tablet Take 325 mg by mouth every other day.   Yes [provider]  fluticasone  (FLONASE) 50 MCG/ACT nasal spray Place into both nostrils daily.   Yes [provider]  hydroxypropyl methylcellulose / hypromellose (ISOPTO TEARS / GONIOVISC) 2.5 % ophthalmic solution Place 1 drop into both eyes 4 (four) times daily.   Yes [provider]  insulin aspart (NOVOLOG) 100 UNIT/ML injection Inject 12 Units into the skin in the morning and at bedtime. Inject 12 units in the morning, Inject 12 units in the evening   Yes [provider]  LANTUS 100 UNIT/ML injection INJECT [place] BREAKFAST Patient taking differently: 28 Units in the morning. 07/19/19  Yes Karamalegos, Devonne Doughty, DO  losartan (COZAAR) 25 MG tablet Take 12.5 mg by mouth daily. 11/13/20  Yes [provider]  omeprazole (PRILOSEC) 20 MG capsule TAKE 1 CAPSULE BY MOUTH TWICE A DAY BEFORE A MEAL 05/20/21  Yes Karamalegos, Devonne Doughty, DO  senna (SENOKOT) 8.6 MG tablet Take 1 tablet by mouth as needed for constipation.   Yes [provider]  sucralfate (CARAFATE) 1 g tablet Take 1 g by mouth 4 (four) times daily -  with meals and at bedtime.   Yes [provider]  atropine 1 % ophthalmic solution INSTILL 1 DROP IN RIGHT EYE TWO TIMES A DAY SURGERY 07/18/19 Patient not taking: Reported on 06/12/2021 07/14/19   [provider]  ipratropium (ATROVENT) 0.06 % nasal spray USE 2 SPRAYS IN EACH NOSTRIL FOUR TIMES DAILY FOR UP TO 5 TO 7 DAYS THEN STOP 09/07/18   Karamalegos, Devonne Doughty, DO  polyethylene glycol powder (GLYCOLAX/MIRALAX) 17 GM/SCOOP powder Take 17-34 g by mouth 2 (two) times daily as needed for mild constipation or moderate constipation. Patient not taking: Reported on 06/12/2021 04/08/19   [name], DO  sodium chloride (MURO 128) 5 % ophthalmic solution INSTILL 1 DROP IN [facility] EYE FOUR TIMES A DAY 11/11/19   [provider]    Physical Exam: Vitals:   07/11/21 1130 07/11/21 1230 07/11/21 1255 07/11/21 1556  BP: (!)  151/65 137/62 (!) 142/47 (!) 141/60  Pulse: 66 61 68 62  Resp: (!) '21 12 18 18  '$ Temp:   97.7 F (36.5 C) 97.8 F (36.6 C)  TempSrc:   Oral   SpO2: 96% 99% 97% 99%  Height:       Physical Exam Vitals and nursing note reviewed.  Constitutional:      Appearance: Normal appearance.  HENT:     Head: Normocephalic and atraumatic.     Nose: Nose normal.     Mouth/Throat:     Mouth: Mucous membranes are moist.  Eyes:     Pupils: Pupils are equal, round, and reactive to light.  Cardiovascular:     Rate and Rhythm: Normal rate and regular rhythm.  Pulmonary:     Effort: Pulmonary effort is normal.  Breath sounds: Normal breath sounds.  Abdominal:     General: Abdomen is flat. Bowel sounds are normal.     Palpations: Abdomen is soft.  Musculoskeletal:        General: Normal range of motion.     Cervical back: Normal range of motion and neck supple.  Skin:    General: Skin is warm and dry.  Neurological:     General: No focal deficit present.     Mental Status: He is alert and oriented to person, place, and time.     Comments: Able to move all extremities.  Decreased handgrip in the left hand  Psychiatric:        Mood and Affect: Mood normal.        Behavior: Behavior normal.     Data Reviewed: Relevant notes from primary care and specialist visits, past discharge summaries as available in EHR, including Care Everywhere. Prior diagnostic testing as pertinent to current admission diagnoses Updated medications and problem lists for reconciliation ED course, including vitals, labs, imaging, treatment and response to treatment Triage notes, nursing and pharmacy notes and ED provider's notes Notable results as noted in HPI Labs reviewed.  Sodium 141, potassium 4.7, chloride 112, bicarb 24, glucose 265, BUN 35, creatinine 1.42, calcium 8.6, white count 8.3, hemoglobin 12.2, hematocrit 37.3, platelet count 226 CT scan of the head without contrast shows no acute intracranial  abnormality. Mild chronic microvascular ischemic changes of the white matter, unchanged. 12-lead EKG reviewed by me shows normal sinus rhythm. There are no new results to review at this time.  Assessment and Plan: * TIA (transient ischemic attack) Patient presents for evaluation of left facial numbness as well as numbness involving the left upper extremity with weakness. Symptoms have improved but not completely resolved We will obtain MRI of the brain to rule out an acute stroke Obtain 2D echocardiogram to rule out cardiac thrombus We will consult ST/PT/OT Allow for permissive hypertension until an acute stroke is ruled out Continue aspirin, Plavix and high intensity statin Consult neurology  Hypertension Hold Cozaar until acute stroke is ruled out  GERD (gastroesophageal reflux disease) Stable Continue Protonix  Type 2 diabetes, controlled, with neuropathy ([place]) We will keep patient n.p.o. until swallow function has been evaluated Continue Levemir but at a decreased dose Blood sugar checks every 4 hours while n.p.o.      Advance Care Planning:   Code Status: DNR   Consults: Neurology  Family Communication: Greater than 50% of time was spent discussing patient's condition and plan of care with him and his daughter at the bedside.  All questions and concerns have been addressed.  They verbalized understanding agree with the plan.  CODE STATUS was discussed and he wishes to be placed originally resuscitate status  Severity of Illness: The appropriate patient status for this patient is OBSERVATION. Observation status is judged to be reasonable and necessary in order to provide the required intensity of service to ensure the patient's safety. The patient's presenting symptoms, physical exam findings, and initial radiographic and laboratory data in the context of their medical condition is felt to place them at decreased risk for further clinical deterioration. Furthermore, it is  anticipated that the patient will be medically stable for discharge from the hospital within 2 midnights of admission.   Author: Collier Bullock, MD 07/11/2021 4:10 PM  For on call review [email or web address].

## 2021-07-11 NOTE — ED Provider Notes (Signed)
[facility] Provider Note    Event Date/Time   First MD Initiated Contact with Patient 07/11/21 [phone]     (approximate)  History   Chief Complaint: Numbness and Weakness  HPI  Charles York is a 88 y.o. male with a past medical history of anxiety, CKD, diabetes, hypertension, hyperlipidemia, presents to the emergency department for headache left face and left arm weakness/numbness.  According to the patient he was feeling well until approximately 11 AM yesterday when he was walking to get the mail and developed a headache and a sense of generalized weakness.  States he felt somewhat better throughout the day yesterday however upon awakening this morning he felt that his left face and his left arm were numb and somewhat weak.  He states his symptoms have been improving but continued to be present.  Patient states approximately 1 year ago he was diagnosed with a mini stroke involving the same symptoms.  Physical Exam   Triage Vital Signs: ED Triage Vitals [07/11/21 0922]  Enc Vitals Group     BP 138/64     Pulse Rate (!) 118     Resp 17     Temp 98.4 F (36.9 C)     Temp Source Oral     SpO2 100 %     Weight      Height '5\' 9"'$  (1.753 m)     Head Circumference      Peak Flow      Pain Score 6     Pain Loc      Pain Edu?      Excl. in [place]?     Most recent vital signs: Vitals:   07/11/21 0922  BP: 138/64  Pulse: (!) 118  Resp: 17  Temp: 98.4 F (36.9 C)  SpO2: 100%    General: Awake, no distress.  CV:  Good peripheral perfusion.  Regular rate and rhythm  Resp:  Normal effort.  Equal breath sounds bilaterally.  Abd:  No distention.  Soft, nontender.  No rebound or guarding. Other:  Patient does have a somewhat diminished grip strength in left upper extremity 4+/5, states subjective decreased sensation left upper extremity and left face.   ED Results / Procedures / Treatments   EKG  EKG viewed and interpreted by myself shows a normal sinus  rhythm at 76 bpm with a narrow QRS, normal axis, normal intervals, no concerning ST changes.   MEDICATIONS ORDERED IN ED: Medications  sodium chloride flush (NS) 0.9 % injection 3 mL (3 mLs Intravenous Not Given 07/11/21 1014)     IMPRESSION / MDM / ASSESSMENT AND PLAN / ED COURSE  I reviewed the triage vital signs and the nursing notes.  Patient's presentation is most consistent with acute presentation with potential threat to life or bodily function.  Patient presents emergency department for cute onset of weakness and numbness in left face left upper extremity as well as a headache and lightheadedness yesterday.  Patient has a history of a mini stroke last year with similar symptoms.  Overall the patient appears well, no distress.  Patient does continue to have diminished grip strength left upper extremity and subjective sensory deficit in the left face and left arm.  We will check labs, EKG, CT scan of the head and continue to closely monitor.  If CT is normal anticipate patient will require an MRI to further evaluate.  Patient agreeable to plan.  Patient's lab work is overall reassuring.  CBC is  normal.  Chemistry is normal.  INR is normal.  CT scan head does not appear to show any acute abnormality.  I spoke to Dr. Quinn Axe of neurology.  Symptoms are concerning for TIA we will admit to the hospitalist service and have neurology consult.  FINAL CLINICAL IMPRESSION(S) / ED DIAGNOSES   Neurological deficit Transient ischemic attack   Note:  This document was prepared using Dragon voice recognition software and may include unintentional dictation errors.   Harvest Dark, MD 07/11/21 [phone]

## 2021-07-11 NOTE — Progress Notes (Signed)
*  PRELIMINARY RESULTS* Echocardiogram 2D Echocardiogram has been performed.  Sherrie Sport 07/11/2021, 1:23 PM

## 2021-07-11 NOTE — ED Notes (Signed)
Pt presents to ED with c/o of not feeling well and also reports left sided face numbness as well as L arm numbness also accompanied with generalized weakness that started yesterday. Pt denies any injury or trauma, pt denies taking blood thinners and reports taking a 324 ASA daily. Pt is A&Ox4 at this time.  Pt reports having no appetite for the past 3-4 months and states he has lost about 20 lbs unintentionally over 6 months due to no appetite, pt does state he drinks more soda than water.   Pt denies any pain, pt denies fevers, chills, pt denies V/D but does report some nausea intermittently.   Pt lives home with his daughter.

## 2021-07-11 NOTE — Progress Notes (Signed)
SLP Cancellation Note  Patient Details Name: OTHNIEL MARET MRN: [id number] DOB: 1930/07/21   Cancelled treatment:       Reason Eval/Treat Not Completed: SLP screened, no needs identified, will sign off (chart reviewed; consulted NSG then met w/ pt) Pt denied any difficulty swallowing and is currently on a regular diet; tolerates swallowing pills w/ water per NSG. Pt fed himself sips and bites during this visit. Discussed general aspiration precautions including sitting Fully upright for all oral intake. Pt conversed in conversation w/out expressive/receptive deficits noted; pt denied any speech-language deficits. Transient slight left labial decreased tone noted x1. Speech clear. No further skilled ST services indicated as pt appears at his baseline. Pt agreed. NSG to reconsult if any change in status while admitted.         Orinda Kenner, [place], [place] Speech Language Pathologist Rehab Services; [place] [phone] (ascom) Ricci Dirocco 07/11/2021, 4:06 PM

## 2021-07-11 NOTE — Evaluation (Signed)
Physical Therapy Evaluation Patient Details Name: ALECXANDER York MRN: [id number] DOB: 1930/12/11 Today's Date: 07/11/2021  History of Present Illness  Charles York is a 88 y.o. male with medical history significant for diabetes mellitus, GERD, hypertension, dyslipidemia, history of TIA who presents to the ER via private vehicle for evaluation of headache, left arm numbness and weakness as well as left face numbness which started 1 day prior to his admission at about 10:30 AM.  Patient states he was walking to the mailbox and suddenly developed numbness involving the left side of his face and left arm associated with a frontal headache.  He also had lower extremity weakness but denies any falls.  His symptoms have improved but not completely resolved and so he decided to come to the ER for further evaluation. Imaging negative.    Clinical Impression  Patient received in bed, he is agreeable to PT assessment. Very pleasant. He needs min guard for bed mobility, transfers and ambulation in room. Some mild unsteadiness and would benefit from RW for occasional home use. Patient will continue to benefit from skilled PT while here to improve safety, strength and independence.         Recommendations for follow up therapy are one component of a multi-disciplinary discharge planning process, led by the attending physician.  Recommendations may be updated based on patient status, additional functional criteria and insurance authorization.  Follow Up Recommendations Home health PT    Assistance Recommended at Discharge Intermittent Supervision/Assistance  Patient can return home with the following  Assist for transportation;Help with stairs or ramp for entrance;Assistance with cooking/housework;A little help with walking and/or transfers;A little help with bathing/dressing/bathroom    Equipment Recommendations Rolling walker (2 wheels)  Recommendations for Other Services       Functional Status Assessment  Patient has had a recent decline in their functional status and demonstrates the ability to make significant improvements in function in a reasonable and predictable amount of time.     Precautions / Restrictions Precautions Precaution Comments: low fall Restrictions Weight Bearing Restrictions: No      Mobility  Bed Mobility Overal bed mobility: Modified Independent             General bed mobility comments: increased time    Transfers Overall transfer level: Needs assistance Equipment used: None Transfers: Sit to/from Stand Sit to Stand: Min guard                Ambulation/Gait Ambulation/Gait assistance: Min guard Gait Distance (Feet): 25 Feet Assistive device: None Gait Pattern/deviations: Step-to pattern, Decreased step length - right, Decreased step length - left, Shuffle, Trunk flexed Gait velocity: decreased     General Gait Details: patient ambulates slow, shuffle steps. Decreased step length especially on Right. Had some mild dizziness reported with this.  Stairs            Wheelchair Mobility    Modified Rankin (Stroke Patients Only)       Balance Overall balance assessment: Mild deficits observed, not formally tested, Needs assistance Sitting-balance support: Feet supported Sitting balance-Leahy Scale: Good     Standing balance support: No upper extremity supported, During functional activity Standing balance-Leahy Scale: Fair Standing balance comment: would likely benefit from AD, he is reluctant and does not want to have to rely on device.                             Pertinent Vitals/Pain Pain  Assessment Pain Assessment: No/denies pain    Home Living Family/patient expects to be discharged to:: Private residence Living Arrangements: Children Available Help at Discharge: Family;Available 24 hours/day Type of Home: House Home Access: Stairs to enter   [employer] of Steps: 3   Home Layout: One  level Home Equipment: None      Prior Function Prior Level of Function : Independent/Modified Independent;Driving             Mobility Comments: drives occasionally, but usually is with daughter. Has been very independent until now. ADLs Comments: independent     Hand Dominance        Extremity/Trunk Assessment   Upper Extremity Assessment Upper Extremity Assessment: Defer to OT evaluation    Lower Extremity Assessment Lower Extremity Assessment: Generalized weakness    Cervical / Trunk Assessment Cervical / Trunk Assessment: Normal  Communication   Communication: No difficulties  Cognition Arousal/Alertness: Awake/alert Behavior During Therapy: WFL for tasks assessed/performed Overall Cognitive Status: Within Functional Limits for tasks assessed                                          General Comments      Exercises     Assessment/Plan    PT Assessment Patient needs continued PT services  PT Problem List Decreased strength;Decreased mobility;Decreased activity tolerance;Decreased balance;Decreased knowledge of use of DME       PT Treatment Interventions DME instruction;Therapeutic exercise;Gait training;Balance training;Stair training;Functional mobility training;Therapeutic activities;Patient/family education    PT Goals (Current goals can be found in the Care Plan section)  Acute Rehab PT Goals Patient Stated Goal: to return home with daughter PT Goal Formulation: With patient Time For Goal Achievement: 07/25/21 Potential to Achieve Goals: Good    Frequency Min 2X/week     Co-evaluation               AM-PAC PT "6 Clicks" Mobility  Outcome Measure Help needed turning from your back to your side while in a flat bed without using bedrails?: A Little Help needed moving from lying on your back to sitting on the side of a flat bed without using bedrails?: A Little Help needed moving to and from a bed to a chair (including a  wheelchair)?: A Little Help needed standing up from a chair using your arms (e.g., wheelchair or bedside chair)?: A Little Help needed to walk in hospital room?: A Little Help needed climbing 3-5 steps with a railing? : A Little 6 Click Score: 18    End of Session Equipment Utilized During Treatment: Gait belt Activity Tolerance: Patient tolerated treatment well Patient left: in bed;with call bell/phone within reach Nurse Communication: Mobility status PT Visit Diagnosis: Unsteadiness on feet (R26.81);Difficulty in walking, not elsewhere classified (R26.2);Muscle weakness (generalized) (M62.81)    Time: 5643-3295 PT Time Calculation (min) (ACUTE ONLY): 14 min   Charges:   PT Evaluation $PT Eval Moderate Complexity: 1 Mod          Aylan Bayona, PT, GCS 07/11/21,3:24 PM

## 2021-07-11 NOTE — Telephone Encounter (Addendum)
  Chief Complaint: Very dizzy, bad headache, diarrhea, unbalanced, felt like passing out yesterday, still very weak today Symptoms: Very dizzy, BP elevated for him, diarrhea, headache in forehead  Frequency: Since yesterday while out taking a walk it hit him suddenly.   History of mini stroke a yr. ago Pertinent Negatives: Patient denies visual changes, runny nose or sore throat or fever. Disposition: '[x]'$ ED /'[]'$ Urgent Care (no appt availability in office) / '[]'$ Appointment(In office/virtual)/ '[]'$  [facility]/ '[]'$ Home Care/ '[]'$ Refused Recommended Disposition /'[]'$ [facility]/ '[]'$  Follow-up with PCP Additional Notes: Referred to ED.  Daughter taking him to [facility].   Information sent to [facility].   I called into [facility] and spoke with Apolonio Schneiders.  She will let Dr.Karamalegos know of referral.

## 2021-07-11 NOTE — Assessment & Plan Note (Addendum)
   Held Cozaar to resume once acute CVA r/o

## 2021-07-11 NOTE — Assessment & Plan Note (Addendum)
Stable ?Continue Protonix ?

## 2021-07-11 NOTE — ED Triage Notes (Signed)
Patient to ER via P[facility] with complaints of generalized weakness and left sided numbness. Reports that yesterday afternoon he was walking, suddenly felt weak and dizzy. Reports that he lay down and went to bed. States that when he woke up this morning he had a bad headache, also experiencing left arm and facial numbness.   Takes daily aspirin. Denies blood thinner usage. Denies fall/ injury.

## 2021-07-11 NOTE — Assessment & Plan Note (Addendum)
No concerns on swallow eval  Continue Levemir but at a decreased dose while npo

## 2021-07-12 ENCOUNTER — Other Ambulatory Visit (HOSPITAL_BASED_OUTPATIENT_CLINIC_OR_DEPARTMENT_OTHER): Payer: Self-pay | Admitting: Osteopathic Medicine

## 2021-07-12 ENCOUNTER — Observation Stay: Payer: Medicare Other

## 2021-07-12 DIAGNOSIS — R29818 Other symptoms and signs involving the nervous system: Secondary | ICD-10-CM | POA: Diagnosis not present

## 2021-07-12 DIAGNOSIS — I6502 Occlusion and stenosis of left vertebral artery: Secondary | ICD-10-CM | POA: Diagnosis not present

## 2021-07-12 DIAGNOSIS — G459 Transient cerebral ischemic attack, unspecified: Secondary | ICD-10-CM | POA: Diagnosis not present

## 2021-07-12 LAB — LIPID PANEL
Cholesterol: 99 mg/dL (ref 0–200)
HDL: 37 mg/dL — ABNORMAL LOW (ref >40)
LDL Cholesterol: 45 mg/dL (ref 0–99)
Total CHOL/HDL Ratio: 2.7 RATIO
Triglycerides: 86 mg/dL (ref <150)
VLDL: 17 mg/dL (ref 0–40)

## 2021-07-12 LAB — GLUCOSE, CAPILLARY
Glucose-Capillary: 140 mg/dL — ABNORMAL HIGH (ref 70–99)
Glucose-Capillary: 175 mg/dL — ABNORMAL HIGH (ref 70–99)
Glucose-Capillary: 160 mg/dL — ABNORMAL HIGH (ref 70–99)

## 2021-07-12 MED ORDER — ADULT MULTIVITAMIN W/MINERALS CH
1.0000 | ORAL_TABLET | Freq: Every day | ORAL | Status: DC
Start: 2021-07-12 — End: 2021-07-12
  Administered 2021-07-12: 1 via ORAL
  Filled 2021-07-12: qty 1

## 2021-07-12 MED ORDER — INSULIN ASPART 100 UNIT/ML IJ SOLN: 12.0000 [IU] | Freq: Two times a day (BID) | INTRAMUSCULAR | 11 refills | Status: DC

## 2021-07-12 MED ORDER — CLOPIDOGREL BISULFATE 75 MG PO TABS: 75.0000 mg | ORAL_TABLET | Freq: Every day | ORAL | 0 refills | Status: DC

## 2021-07-12 MED ORDER — GLUCERNA SHAKE PO LIQD
237.0000 mL | Freq: Three times a day (TID) | ORAL | Status: DC
  Administered 2021-07-12 (×2): 237 mL via ORAL

## 2021-07-12 MED ORDER — ASPIRIN 81 MG PO CHEW: 81.0000 mg | CHEWABLE_TABLET | Freq: Every day | ORAL | Status: AC

## 2021-07-12 NOTE — Progress Notes (Addendum)
PT Cancellation Note  Patient Details Name: QUANDARIUS NILL MRN: [id number] DOB: 06/27/1930   Cancelled Treatment:    Reason Eval/Treat Not Completed: OT screened, no needs identified, will sign off. Pt seen by OT this date. OT states pt is fully independent with ambulation no AD and with navigation of stairs. Pt has been navigating within his room and bathroom independently. Pt no longer has PT needs; safe for d/c home with no PT follow up. Will sign-off at this time; please re-consult if status changes.    Patrina Levering PT, DPT

## 2021-07-12 NOTE — Progress Notes (Signed)
Pt A/Ox4 upon reivew of AVS. Reviewed the need for him to call to make a referral appt for neurologist. PIV removed. Patients daughter will be driving home .

## 2021-07-12 NOTE — TOC Initial Note (Signed)
Transition of Care [facility]) - Initial/Assessment Note    Patient Details  Name: CORIE VAVRA MRN: [id number] Date of Birth: [birth year]  Transition of Care [facility]) CM/SW Contact:    Pete Pelt, RN Phone Number: 07/12/2021, 3:08 PM  Clinical Narrative:    Patient states he lives at home with daughter.  Patient still drives himself to appointments, but states that daughter would assist if needed.  Patient has no medication concerns at this time.  PT initially recommended home health, but later signed off with no needs.  Patient declines any toc needs at this time.                Barriers to Discharge: Continued Medical Work up   Patient Goals and CMS Choice        Expected Discharge Plan and Services         Living arrangements for the past 2 months: Single Family Home                                      Prior Living Arrangements/Services Living arrangements for the past 2 months: Single Family Home Lives with:: Self, Adult Children Patient language and need for interpreter reviewed:: Yes Do you feel safe going back to the place where you live?: Yes      Need for Family Participation in Patient Care: Yes (Comment) Care giver support system in place?: Yes (comment)   Criminal Activity/Legal Involvement Pertinent to Current Situation/Hospitalization: No - Comment as needed  Activities of Daily Living Home Assistive Devices/Equipment: Cane (specify quad or straight) ADL Screening (condition at time of admission) Patient's cognitive ability adequate to safely complete daily activities?: Yes Is the patient deaf or have difficulty hearing?: Yes Does the patient have difficulty seeing, even when wearing glasses/contacts?: Yes Does the patient have difficulty concentrating, remembering, or making decisions?: No Patient able to express need for assistance with ADLs?: Yes Does the patient have difficulty dressing or bathing?: No Independently performs ADLs?: Yes  (appropriate for developmental age) Does the patient have difficulty walking or climbing stairs?: No Weakness of Legs: Both Weakness of Arms/Hands: Left  Permission Sought/Granted Permission sought to share information with : Case Manager Permission granted to share information with : Yes, Verbal Permission Granted              Emotional Assessment Appearance:: Appears stated age Attitude/Demeanor/Rapport: Gracious, Engaged Affect (typically observed): Pleasant, Appropriate Orientation: : Oriented to Self, Oriented to Place, Oriented to  Time, Oriented to Situation Alcohol / Substance Use: Not Applicable Psych Involvement: No (comment)  Admission diagnosis:  TIA (transient ischemic attack) [G45.9] Patient Active Problem List   Diagnosis Date Noted   TIA (transient ischemic attack) 07/11/2021   Hypertension    Overweight with body mass index (BMI) of 27 to 27.9 in adult 01/09/2021   Major depression, recurrent, full remission ([place]) 10/12/2019   GAD (generalized anxiety disorder) 10/12/2019   Chronic right shoulder pain 08/17/2018   Absolute anemia 07/30/2018   Primary osteoarthritis involving multiple joints 06/15/2018   GERD (gastroesophageal reflux disease) 11/18/2016   Diabetic retinopathy ([place]) 11/18/2016   Hyperlipidemia associated with type 2 diabetes mellitus ([place]) 11/18/2016   Daytime sleepiness 07/15/2016   Fatigue 07/15/2016   Presbycusis of both ears 07/15/2016   Tinnitus of right ear 07/15/2016   Allergic rhinitis due to allergen 06/09/2016   CKD (chronic kidney disease), stage III ([place]) 02/27/2016  BPPV (benign paroxysmal positional vertigo), left 02/27/2016   Chronic pansinusitis 02/27/2016   Arthritis 07/03/2015   Colon polyp 07/03/2015   Benign hypertension with CKD (chronic kidney disease) stage III ([place]) 07/03/2015   High cholesterol 07/03/2015   Type 2 diabetes, controlled, with neuropathy ([place]) 07/03/2015   PCP:  [name],  DO Pharmacy:   [id number] [place] [place] [address]Phone: [phone]: [address]Phone: [phone]: [phone]    Social Determinants of Health (SDOH) Interventions    Readmission Risk Interventions     No data to display

## 2021-07-12 NOTE — Discharge Instructions (Signed)

## 2021-07-12 NOTE — Progress Notes (Signed)
Initial Nutrition Assessment  DOCUMENTATION CODES:   Not applicable  INTERVENTION:   -Liberalize diet to carb modified for wider variety of meal selections -Glucerna Shake po TID, each supplement provides 220 kcal and 10 grams of protein  -MVI with minerals daily  NUTRITION DIAGNOSIS:   Inadequate oral intake related to nausea, decreased appetite as evidenced by per patient/family report.  GOAL:   Patient will meet greater than or equal to 90% of their needs  MONITOR:   PO intake, Supplement acceptance  REASON FOR ASSESSMENT:   Malnutrition Screening Tool    ASSESSMENT:   Pt with medical history significant for diabetes mellitus, GERD, hypertension, dyslipidemia, history of TIA who presents for evaluation of headache, left arm numbness, weakness andleft face numbness  Pt admitted with TIA.   Spoke with pt at bedside, who reports feeling better today. He reports a decreased appetite over the past 2 months, which he attributes to having nausea in the morning, which prevents him from eating. He generally consumes 2 meals per day (Lunch: Glucerna shake and Dinner: meat, starch, and vegetable prepared by daughter whom he lives with). Observed meal tray; pt consumed 90% of of breakfast meal. He denies any difficulty chewing or swallowing foods.   Pt shares that his UBW is around 185# and endorses a 15-20# wt loss over the past 2 months. Reviewed wt hx; pt has experienced a 3.2% wt loss over the past 3 months, which is not significant for time frame.   Pt is very active, rides a stationery bike for 30 minutes daily. He has generalized mild muscle and fat depletions, which are likely consistent with advanced age.   Discussed importance of good meal and supplement intake to promote healing. Pt amenable to continue Glucerna supplements.   Medications reviewed and include ferrous sulfate.    Lab Results  Component Value Date   HGBA1C 7.3 (H) 07/11/2021   PTA DM medications are 27  units insulin glargine daily and 12 units insulin aspart BID. Per ADA's Standards of Medical Care for Diabetes, glycemic targets for elderly patients who are otherwise healthy (few medical impairments) and cognitively intact should be less stringent (Hgb A1c <7.5).    Labs reviewed: CBGS: 128-157 (inpatient orders for glycemic control are 0-15 units insulin aspart TID with meals and 20 units insulin glargine-yfgn daily).     NUTRITION - FOCUSED PHYSICAL EXAM:  Flowsheet Row Most Recent Value  Orbital Region Mild depletion  Upper Arm Region Mild depletion  Thoracic and Lumbar Region No depletion  Buccal Region No depletion  Temple Region Mild depletion  Clavicle Bone Region Mild depletion  Clavicle and Acromion Bone Region No depletion  Scapular Bone Region No depletion  Dorsal Hand Mild depletion  Patellar Region Moderate depletion  Anterior Thigh Region Moderate depletion  Posterior Calf Region Moderate depletion  Edema (RD Assessment) None  Hair Reviewed  Eyes Reviewed  Mouth Reviewed  Skin Reviewed  Nails Reviewed       Diet Order:   Diet Order             Diet Carb Modified Fluid consistency: Thin; Room service appropriate? Yes  Diet effective now                   EDUCATION NEEDS:   Education needs have been addressed  Skin:  Skin Assessment: Reviewed RN Assessment  Last BM:  07/10/21  Height:   Ht Readings from Last 1 Encounters:  07/11/21 '5\' 9"'$  (1.753 m)  Weight:   Wt Readings from Last 1 Encounters:  06/12/21 81.6 kg    Ideal Body Weight:  72.7 kg  BMI:  Body mass index is 26.58 kg/m.  Estimated Nutritional Needs:   Kcal:  1800-2000  Protein:  90-105 grams  Fluid:  > 1.8 L    Loistine Chance, RD, LDN, [place] Registered Dietitian II Certified Diabetes Care and Education Specialist Please refer to [facility] for RD and/or RD on-call/weekend/after hours pager

## 2021-07-12 NOTE — Evaluation (Signed)
Occupational Therapy Evaluation Patient Details Name: Charles York MRN: [id number] DOB: 01-Jun-1930 Today's Date: 07/12/2021   History of Present Illness Charles York is a 88 y.o. male with medical history significant for diabetes mellitus, GERD, hypertension, dyslipidemia, history of TIA who presents to the ER via private vehicle for evaluation of headache, left arm numbness and weakness as well as left face numbness which started 1 day prior to his admission at about 10:30 AM.  Patient states he was walking to the mailbox and suddenly developed numbness involving the left side of his face and left arm associated with a frontal headache.  He also had lower extremity weakness but denies any falls.  His symptoms have improved but not completely resolved and so he decided to come to the ER for further evaluation.   Clinical Impression   Upon entering the room, pt supine in bed and agreeable to OT intervention. Pt reports living at home with family and being independent at baseline. Pt reports no numbness or tingling at this time. B UE strength and coordination testing is WNLs for age. Pt dons B socks with figure four position seated on EOB. Pt stands and ambulates without use of AD independently. Pt demonstrating ability to ascend and descend flight of stairs with use of 1 hand rails without assistance and no LOB. Pt has been going back and forth to bathroom without assistance. Pt does not need skilled OT intervention. OT to sign off and pt agrees.      Recommendations for follow up therapy are one component of a multi-disciplinary discharge planning process, led by the attending physician.  Recommendations may be updated based on patient status, additional functional criteria and insurance authorization.   Follow Up Recommendations  No OT follow up    Assistance Recommended at Discharge None     Functional Status Assessment  Patient has not had a recent decline in their functional status   Equipment Recommendations  None recommended by OT       Precautions / Restrictions Precautions Precaution Comments: low fall Restrictions Weight Bearing Restrictions: No      Mobility Bed Mobility Overal bed mobility: Independent                  Transfers Overall transfer level: Independent                            ADL either performed or assessed with clinical judgement      Vision Patient Visual Report: No change from baseline              Pertinent Vitals/Pain Pain Assessment Pain Assessment: No/denies pain     Hand Dominance     Extremity/Trunk Assessment Upper Extremity Assessment Upper Extremity Assessment: Defer to OT evaluation   Lower Extremity Assessment Lower Extremity Assessment: Generalized weakness       Communication Communication Communication: No difficulties   Cognition Arousal/Alertness: Awake/alert Behavior During Therapy: WFL for tasks assessed/performed Overall Cognitive Status: Within Functional Limits for tasks assessed                                                  Home Living Family/patient expects to be discharged to:: Private residence Living Arrangements: Children Available Help at Discharge: Family;Available 24 hours/day Type of Home: House Home Access:  Stairs to enter [employer] of Steps: 3   Home Layout: One level               Home Equipment: None          Prior Functioning/Environment Prior Level of Function : Independent/Modified Independent;Driving             Mobility Comments: drives occasionally, but usually is with daughter. ADLs Comments: independent in all aspects of care, drives, and is active                 OT Goals(Current goals can be found in the care plan section) Acute Rehab OT Goals Patient Stated Goal: to go home OT Goal Formulation: With patient Time For Goal Achievement: 07/12/21 Potential to Achieve Goals: Good   OT Frequency:         AM-PAC OT "6 Clicks" Daily Activity     Outcome Measure Help from another person eating meals?: None Help from another person taking care of personal grooming?: None Help from another person toileting, which includes using toliet, bedpan, or urinal?: None Help from another person bathing (including washing, rinsing, drying)?: None Help from another person to put on and taking off regular upper body clothing?: None Help from another person to put on and taking off regular lower body clothing?: None 6 Click Score: 24   End of Session    Activity Tolerance: Patient tolerated treatment well Patient left: in bed;with call bell/phone within reach                   Time: 8341-9622 OT Time Calculation (min): 12 min Charges:  OT General Charges $OT Visit: 1 Visit OT Evaluation $OT Eval Low Complexity: 1 Low  Darleen Crocker, MS, OTR/L , CBIS ascom [phone]  07/12/21, 10:11 AM

## 2021-07-12 NOTE — Discharge Summary (Signed)
Physician Discharge Summary   Patient: Charles York MRN: [id number]  DOB: [birth year]   Admit:     Date of Admission: 07/11/2021 Admitted from: home   Discharge: Date of discharge: 07/12/21 Disposition: Home Condition at discharge: good  CODE STATUS: DNR   Diet recommendation: Cardiac and Carb modified diet   Discharge Physician: Emeterio Reeve, DO [facility]     PCP: [name], DO  Recommendations for Outpatient Follow-up:  Follow up with PCP [name], DO in 1-2 weeks Please obtain labs/tests: CBC, BMP Please follow up on the following pending results: none Neurology follow-up as directed   Brief/Interim Summary: Charles York is an 88 year old gentleman with history DM 2, HTN, HL, TIA presented to ED 07/11/2021 for evaluation of left face/arm numbness, subtle weakness, since 1030-day prior.  Reported sudden onset symptoms when walking to the mailbox, associated with headache, symptoms improved but did not completely resolve.  Same symptoms last year which completely resolved and diagnosed with TIA at that time.  Admitted for further work-up, neurology was consulted.  Imaging as below with MRI brain, MRA head/neck, echocardiogram.  MRA demonstrated approximately 40% stenosis of left vertebral artery proximally, neurology okay for medical management and patient stable for discharge home on aspirin/Plavix as below  Consultants:  Neurology  Procedures:  Echocardiogram       Discharge Diagnoses: Principal Problem:   TIA (transient ischemic attack) Active Problems:   Hypertension   GERD (gastroesophageal reflux disease)   Type 2 diabetes, controlled, with neuropathy ([place])    Assessment & Plan: Type 2 diabetes, controlled, with neuropathy (HCC) No concerns on swallow eval Continue Levemir but at a decreased dose while npo  TIA (transient ischemic attack) Patient presents for evaluation of left facial numbness as well as  numbness involving the left upper extremity with weakness. Symptoms were improved at time of admission but not completely resolved MRI brain and MRA head/neck --> no concerns Echocardiogram --> no concerns ST --> no concerns PT --> home health PT, TOC consulted  OT pending Allow for permissive hypertension until an acute stroke is ruled out --> can resume home antihypertensives ASA, high potency statin, Plavix per neuro recs --> ASA '81mg'$  daily + plavix '75mg'$  daily x21 days f/b plavix '75mg'$  daily monotherapy after that Neurology to follow outpatient A1C: 7.3 LDL: 45  Hypertension Held Cozaar to resume once acute CVA r/o  GERD (gastroesophageal reflux disease) Stable Continue Protonix      Discharge Instructions  Discharge Instructions     Ambulatory referral to Neurology   Complete by: As directed    An appointment is requested in approximately: 6 wks   Diet - low sodium heart healthy   Complete by: As directed    Diet Carb Modified   Complete by: As directed    Increase activity slowly   Complete by: As directed        Allergies as of 07/12/2021       Reactions   Trulicity [dulaglutide] Swelling   Swollen lips, shortness of breath,    Buspirone    Dizziness   Penicillins Itching, Rash   Has patient had a PCN reaction causing immediate rash, facial/tongue/throat swelling, SOB or lightheadedness with hypotension: No Has patient had a PCN reaction causing severe rash involving mucus membranes or skin necrosis: No Has patient had a PCN reaction that required hospitalization: No Has patient had a PCN reaction occurring within the last 10 years: No If all of the above  answers are "NO", then may proceed with Cephalosporin use.        Medication List     STOP taking these medications    aspirin EC 325 MG tablet Replaced by: aspirin 81 MG chewable tablet   atropine 1 % ophthalmic solution   ipratropium 0.06 % nasal spray Commonly known as: ATROVENT    polyethylene glycol powder 17 GM/SCOOP powder Commonly known as: GLYCOLAX/MIRALAX       TAKE these medications    aspirin 81 MG chewable tablet Chew 1 tablet (81 mg total) by mouth daily for 21 days. TAKE WITH PLAVIX FOR 21 DAYS (LAST DOSE 08/02/2021) THEN ONLY TAKE PLAVIX/CLOPIDOGREL Replaces: aspirin EC 325 MG tablet   atorvastatin 40 MG tablet Commonly known as: LIPITOR Take 1 tablet (40 mg total) by mouth at bedtime.   Cholecalciferol 25 MCG (1000 UT) tablet TAKE TWO TABLETS BY MOUTH EVERY DAY AS NEEDED   clopidogrel 75 MG tablet Commonly known as: PLAVIX Take 1 tablet (75 mg total) by mouth daily. REFILLS PER PCP/NEUROLOGIST   ferrous sulfate 325 (65 FE) MG tablet Take 325 mg by mouth every other day.   fluticasone 50 MCG/ACT nasal spray Commonly known as: FLONASE Place into both nostrils daily.   hydroxypropyl methylcellulose / hypromellose 2.5 % ophthalmic solution Commonly known as: ISOPTO TEARS / GONIOVISC Place 1 drop into both eyes 4 (four) times daily.   insulin aspart 100 UNIT/ML injection Commonly known as: novoLOG Inject 12 Units into the skin in the morning and at bedtime. Inject 12 units in the morning, Inject 12 units in the evening, TAKE BEFORE MEALS What changed: additional instructions   Lantus 100 UNIT/ML injection Generic drug: insulin glargine INJECT 27 UNITS SUBCUTANEOUSLY BEFORE BREAKFAST What changed: See the new instructions.   losartan 25 MG tablet Commonly known as: COZAAR Take 12.5 mg by mouth daily.   omeprazole 20 MG capsule Commonly known as: PRILOSEC TAKE 1 CAPSULE BY MOUTH TWICE A DAY BEFORE A MEAL   senna 8.6 MG tablet Commonly known as: SENOKOT Take 1 tablet by mouth as needed for constipation.   sodium chloride 5 % ophthalmic solution Commonly known as: MURO 128 INSTILL 1 DROP IN EACH EYE FOUR TIMES A DAY   sucralfate 1 g tablet Commonly known as: CARAFATE Take 1 g by mouth 4 (four) times daily -  with meals and at  bedtime.        Diet Orders (From admission, onward)     Start     Ordered   07/12/21 1025  Diet Carb Modified Fluid consistency: Thin; Room service appropriate? Yes  Diet effective now       Question Answer Comment  Diet-HS Snack? Nothing   Calorie Level Medium 1600-2000   Fluid consistency: Thin   Room service appropriate? Yes      07/12/21 1025   07/12/21 0000  Diet - low sodium heart healthy        07/12/21 1728   07/12/21 0000  Diet Carb Modified        07/12/21 1728               Allergies  Allergen Reactions   Trulicity [Dulaglutide] Swelling    Swollen lips, shortness of breath,    Buspirone     Dizziness   Penicillins Itching and Rash    Has patient had a PCN reaction causing immediate rash, facial/tongue/throat swelling, SOB or lightheadedness with hypotension: No Has patient had a PCN reaction causing severe rash involving  mucus membranes or skin necrosis: No Has patient had a PCN reaction that required hospitalization: No Has patient had a PCN reaction occurring within the last 10 years: No If all of the above answers are "NO", then may proceed with Cephalosporin use.      Subjective: Patient seen and examined resting in bed on general medical floor, alert/awake, no apparent distress.  He is very pleasant, states that his symptoms have completely resolved as of today, feels ready for discharge home when cleared by neurology.   Discharge Exam: Vitals:   07/12/21 0348 07/12/21 0554 07/12/21 0847 07/12/21 1713  BP: (!) 106/58 112/62 (!) 126/58 118/66  Pulse: 63 63 63 65  Resp: '16 16 14 18  '$ Temp: 98.3 F (36.8 C) 97.8 F (36.6 C) 97.8 F (36.6 C) 97.9 F (36.6 C)  TempSrc: Oral  Oral Oral  SpO2: 98% 97% 99% 98%  Height:      General: Pt is alert, awake, not in acute distress Cardiovascular: RRR, S1/S2 +, no rubs, no gallops Respiratory: CTA bilaterally, no wheezing, no rhonchi Abdominal: Soft, NT, ND, bowel sounds + Extremities: no edema, no  cyanosis     The results of significant diagnostics from this hospitalization (including imaging, microbiology, ancillary and laboratory) are listed below for reference.     Microbiology: No results found for this or any previous visit (from the past 240 hour(s)).   Labs: BNP (last 3 results) No results for input(s): "BNP" in the last 8760 hours. Basic Metabolic Panel: Recent Labs  Lab 07/11/21 0924  NA 141  K 4.7  CL 112*  CO2 24  GLUCOSE 265*  BUN 35*  CREATININE 1.42*  CALCIUM 8.6*   Liver Function Tests: Recent Labs  Lab 07/11/21 0924  AST 17  ALT 20  ALKPHOS 90  BILITOT 0.6  PROT 7.0  ALBUMIN 4.0   No results for input(s): "LIPASE", "AMYLASE" in the last 168 hours. No results for input(s): "AMMONIA" in the last 168 hours. CBC: Recent Labs  Lab 07/11/21 0924  WBC 8.3  NEUTROABS 4.5  HGB 12.2*  HCT 37.3*  MCV 97.9  PLT 226   Cardiac Enzymes: No results for input(s): "CKTOTAL", "CKMB", "CKMBINDEX", "TROPONINI" in the last 168 hours. BNP: Invalid input(s): "POCBNP" CBG: Recent Labs  Lab 07/11/21 1626 07/11/21 2038 07/12/21 0806 07/12/21 1233 07/12/21 1711  GLUCAP 157* 128* 140* 175* 160*   D-Dimer No results for input(s): "DDIMER" in the last 72 hours. Hgb A1c Recent Labs    07/11/21 0924  HGBA1C 7.3*   Lipid Profile Recent Labs    07/11/21 0924 07/12/21 0626  CHOL  --  99  HDL  --  37*  LDLCALC  --  45  TRIG  --  86  CHOLHDL  --  2.7  LDLDIRECT 60.9  --    Thyroid function studies No results for input(s): "TSH", "T4TOTAL", "T3FREE", "THYROIDAB" in the last 72 hours.  Invalid input(s): "FREET3" Anemia work up No results for input(s): "VITAMINB12", "FOLATE", "FERRITIN", "TIBC", "IRON", "RETICCTPCT" in the last 72 hours. Urinalysis    Component Value Date/Time   COLORURINE STRAW (A) 09/15/2020 1002   APPEARANCEUR CLEAR (A) 09/15/2020 1002   LABSPEC 1.010 09/15/2020 1002   PHURINE 5.0 09/15/2020 1002   GLUCOSEU NEGATIVE  09/15/2020 1002   [place] 09/15/2020 1002   BILIRUBINUR NEGATIVE 09/15/2020 1002   KETONESUR 5 (A) 09/15/2020 1002   PROTEINUR NEGATIVE 09/15/2020 1002   NITRITE NEGATIVE 09/15/2020 [place] 09/15/2020 1002  Sepsis Labs Recent Labs  Lab 07/11/21 0924  WBC 8.3   Microbiology No results found for this or any previous visit (from the past 240 hour(s)). Imaging MR ANGIO NECK WO CONTRAST  Result Date: 07/12/2021 CLINICAL DATA:  Neuro deficit, acute, stroke suspected punctate acute pontine infarct, eval posterior circulation EXAM: MRA NECK WITHOUT CONTRAST TECHNIQUE: Angiographic images of the neck were acquired using MRA technique without intravenous contrast. Carotid stenosis measurements (when applicable) are obtained utilizing NASCET criteria, using the distal internal carotid diameter as the denominator. COMPARISON:  None Available. FINDINGS: Aortic arch: Vessel origins are patent. Right carotid system: Common carotid artery and internal carotid are patent without greater than 50% stenosis. Left carotid system: Common carotid artery and internal carotid are patent without greater than 50% stenosis. Vertebral arteries: Left dominant. The right vertebral artery is small throughout its course. Approximately 40% stenosis of the proximal left vertebral artery. Other: None. IMPRESSION: 1. Approximately 40% stenosis of the left vertebral artery proximally. 2. Otherwise, no significant stenosis in the neck. Electronically Signed   By: Margaretha Sheffield M.D.   On: 07/12/2021 16:27   MR BRAIN WO CONTRAST  Result Date: 07/11/2021 CLINICAL DATA:  Neuro deficit, acute, stroke suspected L arm and face weakness and numbness; Neuro deficit, acute, stroke suspected EXAM: MRI HEAD WITHOUT CONTRAST MRA HEAD WITHOUT CONTRAST TECHNIQUE: Multiplanar, multi-echo pulse sequences of the brain and surrounding structures were acquired without intravenous contrast. Angiographic images of the  Circle of Willis were acquired using MRA technique without intravenous contrast. COMPARISON:  CT head from the same day. FINDINGS: MRI HEAD FINDINGS Brain: No acute infarction, hemorrhage, hydrocephalus, extra-axial collection or mass lesion. Mild for age patchy T2/FLAIR hyperintensities within the white matter, nonspecific but compatible with chronic microvascular ischemic disease. Vascular: Major arterial flow voids are maintained skull base. Skull and upper cervical spine: Normal marrow signal. Sinuses/Orbits: Posterior right ethmoid air cell opacification. Other: Moderate right mastoid effusion. MRA HEAD FINDINGS Anterior circulation: Bilateral intracranial ICAs, MCAs, and ACAs are patent without proximal hemodynamically significant stenosis. Partially Azygos ACA, anatomic variant. No aneurysm identified. Posterior circulation: Left dominant intradural vertebral artery. The small/nondominant right intradural vertebral artery largely terminates as PICA with minimal contribution to the basilar artery, anatomic variant. The left intradural vertebral artery, basilar artery, and bilateral posterior cerebral arteries are patent without proximal hemodynamically significant stenosis Anatomic variants: Detailed above. IMPRESSION: 1. No evidence of acute intracranial abnormality. 2. No emergent large vessel occlusion or proximal hemodynamically significant stenosis. Electronically Signed   By: Margaretha Sheffield M.D.   On: 07/11/2021 14:29   MR ANGIO HEAD WO CONTRAST  Result Date: 07/11/2021 CLINICAL DATA:  Neuro deficit, acute, stroke suspected L arm and face weakness and numbness; Neuro deficit, acute, stroke suspected EXAM: MRI HEAD WITHOUT CONTRAST MRA HEAD WITHOUT CONTRAST TECHNIQUE: Multiplanar, multi-echo pulse sequences of the brain and surrounding structures were acquired without intravenous contrast. Angiographic images of the Circle of Willis were acquired using MRA technique without intravenous contrast.  COMPARISON:  CT head from the same day. FINDINGS: MRI HEAD FINDINGS Brain: No acute infarction, hemorrhage, hydrocephalus, extra-axial collection or mass lesion. Mild for age patchy T2/FLAIR hyperintensities within the white matter, nonspecific but compatible with chronic microvascular ischemic disease. Vascular: Major arterial flow voids are maintained skull base. Skull and upper cervical spine: Normal marrow signal. Sinuses/Orbits: Posterior right ethmoid air cell opacification. Other: Moderate right mastoid effusion. MRA HEAD FINDINGS Anterior circulation: Bilateral intracranial ICAs, MCAs, and ACAs are patent without proximal hemodynamically significant stenosis.  Partially Azygos ACA, anatomic variant. No aneurysm identified. Posterior circulation: Left dominant intradural vertebral artery. The small/nondominant right intradural vertebral artery largely terminates as PICA with minimal contribution to the basilar artery, anatomic variant. The left intradural vertebral artery, basilar artery, and bilateral posterior cerebral arteries are patent without proximal hemodynamically significant stenosis Anatomic variants: Detailed above. IMPRESSION: 1. No evidence of acute intracranial abnormality. 2. No emergent large vessel occlusion or proximal hemodynamically significant stenosis. Electronically Signed   By: Margaretha Sheffield M.D.   On: 07/11/2021 14:29   ECHOCARDIOGRAM COMPLETE BUBBLE STUDY  Result Date: 07/11/2021    ECHOCARDIOGRAM REPORT   Patient Name:   JHACE FENNELL Eisler Date of Exam: 07/11/2021 Medical Rec #:  [id number]     Height:       69.0 in Accession #:    [id number]    Weight:       180.0 lb Date of Birth:  July 13, 1930      BSA:          1.976 m Patient Age:    7 years      BP:           128/67 mmHg Patient Gender: M             HR:           70 bpm. Exam Location:  [facility] Procedure: 2D Echo, Cardiac Doppler, Color Doppler and Saline Contrast Bubble            Study Indications:     Stroke 434.91 / I63.9   History:         Patient has prior history of Echocardiogram examinations, most                  recent 01/20/2019. Risk Factors:Diabetes, Hypertension and                  Dyslipidemia. CKD.  Sonographer:     Sherrie Sport Referring Phys:  [place] Diagnosing Phys: Serafina Royals MD IMPRESSIONS  1. Left ventricular ejection fraction, by estimation, is 60 to 65%. The left ventricle has normal function. The left ventricle has no regional wall motion abnormalities. Left ventricular diastolic parameters were normal.  2. Right ventricular systolic function is normal. The right ventricular size is normal.  3. The mitral valve is normal in structure. Mild mitral valve regurgitation.  4. The aortic valve is normal in structure. Aortic valve regurgitation is not visualized. FINDINGS  Left Ventricle: Left ventricular ejection fraction, by estimation, is 60 to 65%. The left ventricle has normal function. The left ventricle has no regional wall motion abnormalities. The left ventricular internal cavity size was normal in size. There is  no left ventricular hypertrophy. Left ventricular diastolic parameters were normal. Right Ventricle: The right ventricular size is normal. No increase in right ventricular wall thickness. Right ventricular systolic function is normal. Left Atrium: Left atrial size was normal in size. Right Atrium: Right atrial size was normal in size. Pericardium: There is no evidence of pericardial effusion. Mitral Valve: The mitral valve is normal in structure. Mild mitral valve regurgitation. MV peak gradient, 4.5 mmHg. The mean mitral valve gradient is 2.0 mmHg. Tricuspid Valve: The tricuspid valve is normal in structure. Tricuspid valve regurgitation is mild. Aortic Valve: The aortic valve is normal in structure. Aortic valve regurgitation is not visualized. Aortic valve mean gradient measures 2.0 mmHg. Aortic valve peak gradient measures 3.5 mmHg. Aortic valve area, by VTI measures 2.61 cm.  Pulmonic  Valve: The pulmonic valve was normal in structure. Pulmonic valve regurgitation is not visualized. Aorta: The aortic root and ascending aorta are structurally normal, with no evidence of dilitation. IAS/Shunts: No atrial level shunt detected by color flow Doppler. Agitated saline contrast was given intravenously to evaluate for intracardiac shunting.  LEFT VENTRICLE PLAX 2D LVIDd:         3.50 cm   Diastology LVIDs:         2.40 cm   LV e' medial:    7.18 cm/s LV PW:         1.00 cm   LV E/e' medial:  10.3 LV IVS:        0.85 cm   LV e' lateral:   6.64 cm/s LVOT diam:     2.00 cm   LV E/e' lateral: 11.2 LV SV:         57 LV SV Index:   29 LVOT Area:     3.14 cm  RIGHT VENTRICLE RV Basal diam:  2.90 cm RV S prime:     12.50 cm/s TAPSE (M-mode): 2.4 cm LEFT ATRIUM             Index        RIGHT ATRIUM           Index LA diam:        2.80 cm 1.42 cm/m   RA Area:     17.90 cm LA Vol (A2C):   60.8 ml 30.78 ml/m  RA Volume:   49.40 ml  25.01 ml/m LA Vol (A4C):   51.4 ml 26.02 ml/m LA Biplane Vol: 59.6 ml 30.17 ml/m  AORTIC VALVE                    PULMONIC VALVE AV Area (Vmax):    2.23 cm     PV Vmax:          0.67 m/s AV Area (Vmean):   2.22 cm     PV Vmean:         48.300 cm/s AV Area (VTI):     2.61 cm     PV VTI:           0.170 m AV Vmax:           94.00 cm/s   PV Peak grad:     1.8 mmHg AV Vmean:          65.600 cm/s  PV Mean grad:     1.0 mmHg AV VTI:            0.218 m      PR End Diast Vel: 1.70 msec AV Peak Grad:      3.5 mmHg     RVOT Peak grad:   3 mmHg AV Mean Grad:      2.0 mmHg LVOT Vmax:         66.60 cm/s LVOT Vmean:        46.400 cm/s LVOT VTI:          0.181 m LVOT/AV VTI ratio: 0.83  AORTA Ao Root diam: 4.00 cm MITRAL VALVE                TRICUSPID VALVE MV Area (PHT): 4.17 cm     TR Peak grad:   22.7 mmHg MV Area VTI:   1.93 cm     TR Vmax:        238.00 cm/s MV Peak grad:  4.5 mmHg MV Mean grad:  2.0 mmHg     SHUNTS MV Vmax:       1.06 m/s     Systemic VTI:  0.18 m MV Vmean:       72.5 cm/s    Systemic Diam: 2.00 cm MV Decel Time: 182 msec     Pulmonic VTI:  0.223 m MV E velocity: 74.10 cm/s MV A velocity: 105.00 cm/s MV E/A ratio:  0.71 Serafina Royals MD Electronically signed by Serafina Royals MD Signature Date/Time: 07/11/2021/1:32:22 PM    Final    US Carotid Bilateral  Result Date: 07/11/2021 CLINICAL DATA:  88 year old male with history of stroke. EXAM: BILATERAL CAROTID DUPLEX ULTRASOUND TECHNIQUE: Pearline Cables scale imaging, color Doppler and duplex ultrasound were performed of bilateral carotid and vertebral arteries in the neck. COMPARISON:  [facility] FINDINGS: Criteria: Quantification of carotid stenosis is based on velocity parameters that correlate the residual internal carotid diameter with NASCET-based stenosis levels, using the diameter of the distal internal carotid lumen as the denominator for stenosis measurement. The following velocity measurements were obtained: RIGHT ICA: Peak systolic velocity 82 cm/sec, End diastolic velocity 19 cm/sec CCA: Peak systolic velocity 80 cm/sec SYSTOLIC ICA/CCA RATIO:  1.0 ECA: Peak systolic velocity 85 cm/sec LEFT ICA: Peak systolic velocity 500 cm/sec, End diastolic velocity 27 cm/sec CCA: 94 cm/sec SYSTOLIC ICA/CCA RATIO:  1.5 ECA: 57 cm/sec RIGHT CAROTID ARTERY: No significant atherosclerotic plaque formation. No significant tortuosity. Pulsus bisferiens. Normal low resistance waveforms. RIGHT VERTEBRAL ARTERY:  Antegrade flow. LEFT CAROTID ARTERY: Moderate multifocal atherosclerotic plaque formation, most prominent about the carotid bulb. No significant tortuosity. Pulsus bisferiens. Normal low resistance waveforms. LEFT VERTEBRAL ARTERY:  Antegrade flow. Upper extremity non-invasive blood pressures: Not obtained. IMPRESSION: 1. Right carotid artery system: Patent without significant atherosclerotic plaque formation. 2. Left carotid artery system: 50-69% stenosis secondary to moderate multifocal atherosclerotic plaque formation, most  prominent the carotid bulb. 3.  Vertebral artery system: Patent with antegrade flow bilaterally. 4. Pulsus bisferiens throughout, as could be seen with aortic valvular disorder or hypertrophic cardiomyopathy. Ruthann Cancer, MD [facility] Radiology Electronically Signed   By: Ruthann Cancer M.D.   On: 07/11/2021 13:14   CT HEAD WO CONTRAST  Result Date: 07/11/2021 CLINICAL DATA:  Generalized weakness and left-sided numbness. EXAM: CT HEAD WITHOUT CONTRAST TECHNIQUE: Contiguous axial images were obtained from the base of the skull through the vertex without intravenous contrast. RADIATION DOSE REDUCTION: This exam was performed according to the departmental dose-optimization program which includes automated exposure control, adjustment of the mA and/or kV according to patient size and/or use of iterative reconstruction technique. COMPARISON:  CT head dated January 20, 2020 FINDINGS: Brain: No evidence of acute infarction, hemorrhage, hydrocephalus, extra-axial collection or mass lesion/mass effect. Mild chronic microvascular ischemic changes of the white matter. Vascular: No hyperdense vessel or unexpected calcification. Skull: Normal. Negative for fracture or focal lesion. Sinuses/Orbits: Patchy opacification of the right ethmoid air cells. Right scleral band unchanged. Other: None IMPRESSION: 1.  No acute intracranial abnormality. 2. Mild chronic microvascular ischemic changes of the white matter, unchanged. Electronically Signed   By: Keane Police D.O.   On: 07/11/2021 10:25      Time coordinating discharge: Over 30 minutes  SIGNED:  Emeterio Reeve DO [facility]

## 2021-07-15 ENCOUNTER — Ambulatory Visit: Payer: Self-pay | Admitting: *Deleted

## 2021-07-15 DIAGNOSIS — K2100 Gastro-esophageal reflux disease with esophagitis, without bleeding: Secondary | ICD-10-CM

## 2021-07-15 MED ORDER — PANTOPRAZOLE SODIUM 20 MG PO TBEC: 20.0000 mg | DELAYED_RELEASE_TABLET | Freq: Every day | ORAL | 3 refills | Status: DC

## 2021-07-15 NOTE — Telephone Encounter (Signed)
Please notify patient  He should be taking Plavix. Do not stop this.  CONTINUE Plavix.  The Omeprazole should be switched to Pantoprazole.  I have sent new rx to pharmacy.  Stop Omeprazole Start pantoprazole.  Same dose, same type of med.  Nobie Putnam, [facility] 07/15/2021, 11:34 AM

## 2021-07-15 NOTE — Telephone Encounter (Signed)
Summary: medication issue   Pt was in the hospital this weekend due to a stroke and was prescribed clopidogrel (PLAVIX) 75 MG tablet / when he went to pick this up from the pharmacy the advised him to call his Dr. since he was also taking omeprazole (PRILOSEC) 20 MG capsule and that DR. Raliegh Ip may want to change one of them     Medication question for provider:  Does patient need to stop or change Rx : omeprazole due to new Rx Plavix? Please let patient know. Call to patient- he will expect instructions on medications- he has scheduled an appointment for tomorrow am for hospital follow up.  Reason for Disposition  [1] Caller has URGENT medicine question about med that PCP or specialist prescribed AND [2] triager unable to answer question  Answer Assessment - Initial Assessment Questions 1. NAME of MEDICATION: "What medicine are you calling about?"     Plavix and omeprazole- can he take both? 2. QUESTION: "What is your question?" (e.g., double dose of medicine, side effect)     Concurrent use of CLOPIDOGREL and OMEPRAZOLE may result in reduced clopidogrel's active metabolite exposure and reduced antiplatelet activity.   3. PRESCRIBING HCP: "Who prescribed it?" Reason: if prescribed by specialist, call should be referred to that group.     PCP/Hospital MD Patient has appointment tomorrow  Protocols used: Medication Question Call-A-AH

## 2021-07-16 ENCOUNTER — Ambulatory Visit (INDEPENDENT_AMBULATORY_CARE_PROVIDER_SITE_OTHER): Payer: Medicare Other | Admitting: Family Medicine

## 2021-07-16 ENCOUNTER — Encounter: Payer: Self-pay | Admitting: Family Medicine

## 2021-07-16 VITALS — BP 120/60 | HR 81 | Ht 69.0 in | Wt 177.0 lb

## 2021-07-16 DIAGNOSIS — N183 Chronic kidney disease, stage 3 unspecified: Secondary | ICD-10-CM

## 2021-07-16 DIAGNOSIS — G459 Transient cerebral ischemic attack, unspecified: Secondary | ICD-10-CM

## 2021-07-16 DIAGNOSIS — K2100 Gastro-esophageal reflux disease with esophagitis, without bleeding: Secondary | ICD-10-CM | POA: Diagnosis not present

## 2021-07-16 DIAGNOSIS — I129 Hypertensive chronic kidney disease with stage 1 through stage 4 chronic kidney disease, or unspecified chronic kidney disease: Secondary | ICD-10-CM | POA: Diagnosis not present

## 2021-07-16 DIAGNOSIS — E1140 Type 2 diabetes mellitus with diabetic neuropathy, unspecified: Secondary | ICD-10-CM

## 2021-07-16 MED ORDER — CLOPIDOGREL BISULFATE 75 MG PO TABS: 75.0000 mg | ORAL_TABLET | Freq: Every day | ORAL | 3 refills | Status: DC

## 2021-07-16 NOTE — Patient Instructions (Addendum)
Thank you for coming to the office today.  Stop Omeprazole Start Pantoprazole  Re ordered Plavix '75mg'$   Take Plavix every day PLUS Aspirin '81mg'$  for 21 days  After 21 days you can stop Aspirin and take Plavix only.  Stop Aspirin 81 on July 4th this year. (3 weeks)   Please schedule a Follow-up Appointment to: Return if symptoms worsen or fail to improve.  If you have any other questions or concerns, please feel free to call the office or send a message through [facility]. You may also schedule an earlier appointment if necessary.  Additionally, you may be receiving a survey about your experience at our office within a few days to 1 week by e-mail or mail. We value your feedback.  Nobie Putnam, DO [facility]

## 2021-07-16 NOTE — Progress Notes (Signed)
Subjective:    Patient ID: Charles York, male    DOB: [birth year], 88 y.o.   MRN: [id number]  Charles York is a 88 y.o. male presenting on 07/16/2021 for Hospitalization Follow-up   HPI  HOSPITAL FOLLOW-UP VISIT  Hospital/Location: [facility] Date of Admission: 07/11/21 Date of Discharge: 07/12/21 Transitions of care telephone call: Not completed  Reason for Admission: TIA  - Hospital H&P and Discharge Summary have been reviewed - Patient presents today 4-5 days after recent hospitalization. Brief summary of recent course, patient had symptoms of weakness concern for stroke, hospitalized, imaging done no identified CVA, treated with Aspirin plavix discharged w/ neurology.  He was taking Aspirin '325mg'$  daily prior to hospitalization. Now he was placed on Plavix _ Baby Aspirin 81 mg daily x 21 days then Plavix monotherapy  - Today reports overall has done well after discharge. Symptoms of weakness mostly generalized but he does not have focal Left vs Right weakness. He is doing daily exercises walking, cardio, weights and still active. He does not feel need for home health PT at this time.  Upcoming Neurology apt [facility] 08/27/21   I have reviewed the discharge medication list, and have reconciled the current and discharge medications today.   Current Outpatient Medications:    aspirin 81 MG chewable tablet, Chew 1 tablet (81 mg total) by mouth daily for 21 days. TAKE WITH PLAVIX FOR 21 DAYS (LAST DOSE 08/02/2021) THEN ONLY TAKE PLAVIX/CLOPIDOGREL, Disp: , Rfl:    atorvastatin (LIPITOR) 40 MG tablet, Take 1 tablet (40 mg total) by mouth at bedtime., Disp: 90 tablet, Rfl: 3   Cholecalciferol 25 MCG (1000 UT) tablet, TAKE TWO TABLETS BY MOUTH EVERY DAY AS NEEDED, Disp: , Rfl:    ferrous sulfate 325 (65 FE) MG tablet, Take 325 mg by mouth every other day., Disp: , Rfl:    fluticasone (FLONASE) 50 MCG/ACT nasal spray, Place into both nostrils daily., Disp: , Rfl:    hydroxypropyl methylcellulose /  hypromellose (ISOPTO TEARS / GONIOVISC) 2.5 % ophthalmic solution, Place 1 drop into both eyes 4 (four) times daily., Disp: , Rfl:    insulin aspart (NOVOLOG) 100 UNIT/ML injection, Inject 12 Units into the skin in the morning and at bedtime. Inject 12 units in the morning, Inject 12 units in the evening, TAKE BEFORE MEALS, Disp: 10 mL, Rfl: 11   LANTUS 100 UNIT/ML injection, INJECT 27 UNITS SUBCUTANEOUSLY BEFORE BREAKFAST (Patient taking differently: 28 Units in the morning.), Disp: 10 mL, Rfl: 3   losartan (COZAAR) 25 MG tablet, Take 12.5 mg by mouth daily., Disp: , Rfl:    pantoprazole (PROTONIX) 20 MG tablet, Take 1 tablet (20 mg total) by mouth daily before breakfast., Disp: 90 tablet, Rfl: 3   senna (SENOKOT) 8.6 MG tablet, Take 1 tablet by mouth as needed for constipation., Disp: , Rfl:    sodium chloride (MURO 128) 5 % ophthalmic solution, INSTILL 1 DROP IN EACH EYE FOUR TIMES A DAY, Disp: , Rfl:    sucralfate (CARAFATE) 1 g tablet, Take 1 g by mouth 4 (four) times daily -  with meals and at bedtime., Disp: , Rfl:    clopidogrel (PLAVIX) 75 MG tablet, Take 1 tablet (75 mg total) by mouth daily., Disp: 90 tablet, Rfl: 3  ------------------------------------------------------------------------- Social History   Tobacco Use   Smoking status: Former    Types: Cigarettes    Quit date: 02/04/1983    Years since quitting: 38.4   Smokeless tobacco: Former  Media planner  Vaping Use: Never used  Substance Use Topics   Alcohol use: No    Alcohol/week: 0.0 standard drinks of alcohol   Drug use: No    Review of Systems Per HPI unless specifically indicated above     Objective:    BP 120/60   Pulse 81   Ht '5\' 9"'$  (1.753 m)   Wt 177 lb (80.3 kg)   SpO2 98%   BMI 26.14 kg/m   Wt Readings from Last 3 Encounters:  07/16/21 177 lb (80.3 kg)  06/12/21 180 lb (81.6 kg)  03/20/21 185 lb 12.8 oz (84.3 kg)    Physical Exam Vitals and nursing note reviewed.  Constitutional:      General:  He is not in acute distress.    Appearance: He is well-developed. He is not diaphoretic.     Comments: Well-appearing, comfortable, cooperative  HENT:     Head: Normocephalic and atraumatic.  Eyes:     General:        Right eye: No discharge.        Left eye: No discharge.     Conjunctiva/sclera: Conjunctivae normal.  Neck:     Thyroid: No thyromegaly.  Cardiovascular:     Rate and Rhythm: Normal rate and regular rhythm.     Pulses: Normal pulses.     Heart sounds: Normal heart sounds. No murmur heard. Pulmonary:     Effort: Pulmonary effort is normal. No respiratory distress.     Breath sounds: Normal breath sounds. No wheezing or rales.  Musculoskeletal:        General: Normal range of motion.     Cervical back: Normal range of motion and neck supple.     Comments: Strength 4 out of 5 bilateral upper extremities, equal, nearly 5 out of 5  Lymphadenopathy:     Cervical: No cervical adenopathy.  Skin:    General: Skin is warm and dry.     Findings: No erythema or rash.  Neurological:     Mental Status: He is alert and oriented to person, place, and time. Mental status is at baseline.  Psychiatric:        Behavior: Behavior normal.     Comments: Well groomed, good eye contact, normal speech and thoughts       Results for orders placed or performed during the hospital encounter of 07/11/21  Protime-INR  Result Value Ref Range   Prothrombin Time 13.2 11.4 - 15.2 seconds   INR 1.0 0.8 - 1.2  APTT  Result Value Ref Range   aPTT 32 24 - 36 seconds  CBC  Result Value Ref Range   WBC 8.3 4.0 - 10.5 K/uL   RBC 3.81 (L) 4.22 - 5.81 MIL/uL   Hemoglobin 12.2 (L) 13.0 - 17.0 g/dL   HCT 37.3 (L) 39.0 - 52.0 %   MCV 97.9 80.0 - 100.0 fL   MCH 32.0 26.0 - 34.0 pg   MCHC 32.7 30.0 - 36.0 g/dL   RDW 13.0 11.5 - 15.5 %   Platelets 226 150 - 400 K/uL   nRBC 0.0 0.0 - 0.2 %  Differential  Result Value Ref Range   Neutrophils Relative % 55 %   Neutro Abs 4.5 1.7 - 7.7 K/uL    Lymphocytes Relative 37 %   Lymphs Abs 3.1 0.7 - 4.0 K/uL   Monocytes Relative 6 %   Monocytes Absolute 0.5 0.1 - 1.0 K/uL   Eosinophils Relative 1 %   Eosinophils Absolute 0.1 0.0 -  0.5 K/uL   Basophils Relative 1 %   Basophils Absolute 0.1 0.0 - 0.1 K/uL   Immature Granulocytes 0 %   Abs Immature Granulocytes 0.02 0.00 - 0.07 K/uL  Comprehensive metabolic panel  Result Value Ref Range   Sodium 141 135 - 145 mmol/L   Potassium 4.7 3.5 - 5.1 mmol/L   Chloride 112 (H) 98 - 111 mmol/L   CO2 24 22 - 32 mmol/L   Glucose, Bld 265 (H) 70 - 99 mg/dL   BUN 35 (H) 8 - 23 mg/dL   Creatinine, Ser 1.42 (H) 0.61 - 1.24 mg/dL   Calcium 8.6 (L) 8.9 - 10.3 mg/dL   Total Protein 7.0 6.5 - 8.1 g/dL   Albumin 4.0 3.5 - 5.0 g/dL   AST 17 15 - 41 U/L   ALT 20 0 - 44 U/L   Alkaline Phosphatase 90 38 - 126 U/L   Total Bilirubin 0.6 0.3 - 1.2 mg/dL   GFR, Estimated 47 (L) >60 mL/min   Anion gap 5 5 - 15  LDL cholesterol, direct  Result Value Ref Range   Direct LDL 60.9 0 - 99 mg/dL  Hemoglobin A1c  Result Value Ref Range   Hgb A1c MFr Bld 7.3 (H) 4.8 - 5.6 %   Mean Plasma Glucose 162.81 mg/dL  Glucose, capillary  Result Value Ref Range   Glucose-Capillary 157 (H) 70 - 99 mg/dL  Lipid panel  Result Value Ref Range   Cholesterol 99 0 - 200 mg/dL   Triglycerides 86 <150 mg/dL   HDL 37 (L) >40 mg/dL   Total CHOL/HDL Ratio 2.7 RATIO   VLDL 17 0 - 40 mg/dL   LDL Cholesterol 45 0 - 99 mg/dL  Glucose, capillary  Result Value Ref Range   Glucose-Capillary 128 (H) 70 - 99 mg/dL  Glucose, capillary  Result Value Ref Range   Glucose-Capillary 140 (H) 70 - 99 mg/dL  Glucose, capillary  Result Value Ref Range   Glucose-Capillary 175 (H) 70 - 99 mg/dL  Glucose, capillary  Result Value Ref Range   Glucose-Capillary 160 (H) 70 - 99 mg/dL  ECHOCARDIOGRAM COMPLETE BUBBLE STUDY  Result Value Ref Range   Ao pk vel 0.94 m/s   AV Area VTI 2.61 cm2   AR max vel 2.23 cm2   AV Mean grad 2.0 mmHg   AV  Peak grad 3.5 mmHg   S' Lateral 2.40 cm   AV Area mean vel 2.22 cm2   Area-P 1/2 4.17 cm2   MV VTI 1.93 cm2      Assessment & Plan:   Problem List Items Addressed This Visit     Type 2 diabetes, controlled, with neuropathy (HCC)   TIA (transient ischemic attack) - Primary   Relevant Medications   clopidogrel (PLAVIX) 75 MG tablet   GERD (gastroesophageal reflux disease)   Benign hypertension with CKD (chronic kidney disease) stage III (HCC)    T2DM Reviewed last A1c 7.3, controlled  TIA No focal deficit He has generalized weakness from hospitalization but no focal neuro deficit. He has declined [place] therapy. He will notify if need assistance. He is still exercising and doing better. Re ordered Plavix '75mg'$  Take Plavix every day PLUS Aspirin '81mg'$  for 21 days After 21 days you can stop Aspirin and take Plavix only. Stop Aspirin 81 on July 4th this year. (3 weeks)  GERD Stop Omeprazole due to interaction w Plavix Start Pantoprazole  HTN Controlled  Meds ordered this encounter  Medications  clopidogrel (PLAVIX) 75 MG tablet    Sig: Take 1 tablet (75 mg total) by mouth daily.    Dispense:  90 tablet    Refill:  3    Follow up plan: Return if symptoms worsen or fail to improve.   Nobie Putnam, [facility] 07/16/2021, 8:11 AM

## 2021-07-18 ENCOUNTER — Ambulatory Visit: Payer: Self-pay

## 2021-07-18 NOTE — Telephone Encounter (Signed)
Pt called, advised him it is ok to take Tylenol with Plavix and Protonix. Pt states he has arthritis in his shoulders and usually takes it. I just advised following directions on bottle and not taking more than what's recommended. Pt verbalized understanding. No further assistance needed.   Summary: wanting to make sure ok to take tylenol   Pt is having questions about wither he can take Tylenol with meds recently prescribed? [phone]      Reason for Disposition  Caller has medicine question only, adult not sick, AND triager answers question  Answer Assessment - Initial Assessment Questions 1. NAME of MEDICATION: "What medicine are you calling about?"     Tylenol 2. QUESTION: "What is your question?" (e.g., double dose of medicine, side effect)     Safe to take with newly prescribed medications 3. PRESCRIBING HCP: "Who prescribed it?" Reason: if prescribed by specialist, call should be referred to that group.     Dr. Raliegh Ip 4. SYMPTOMS: "Do you have any symptoms?"     Arthritis in shoulders  Protocols used: Medication Question Call-A-AH

## 2021-08-27 ENCOUNTER — Ambulatory Visit (INDEPENDENT_AMBULATORY_CARE_PROVIDER_SITE_OTHER): Payer: Medicare Other | Admitting: Diagnostic Neuroimaging

## 2021-08-27 ENCOUNTER — Encounter: Payer: Self-pay | Admitting: Diagnostic Neuroimaging

## 2021-08-27 VITALS — BP 115/64 | HR 69 | Ht 65.0 in | Wt 178.0 lb

## 2021-08-27 DIAGNOSIS — G459 Transient cerebral ischemic attack, unspecified: Secondary | ICD-10-CM | POA: Diagnosis not present

## 2021-08-27 IMAGING — CT CT ANGIO NECK
2 of 7 series · 9 of 36 positions shown · IV contrast (APPLIED)
Comparison: None.

CLINICAL DATA: Left-sided weakness. Right hand numbness, left mouth
numbness, and left shoulder pain with symptoms now resolved.

EXAM:
CT ANGIOGRAPHY NECK
TECHNIQUE: Multidetector CT imaging of the neck was performed using the
standard protocol during bolus administration of intravenous
contrast. Multiplanar CT image reconstructions and MIPs were
obtained to evaluate the vascular anatomy. Carotid stenosis
measurements (when applicable) are obtained utilizing NASCET
criteria, using the distal internal carotid diameter as the
denominator.
CONTRAST:  75mL OMNIPAQUE IOHEXOL 350 MG/ML SOLN

[Series 6: ax thin · axial · 0.44mm/px · z∈[-306,-102]mm · 7 of 285 slices shown]
[im 36/285  soft-tissue]
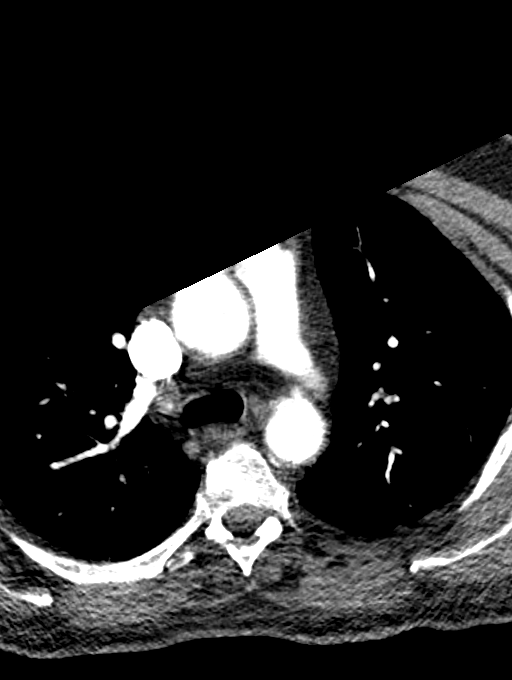
[im 72/285  bone]
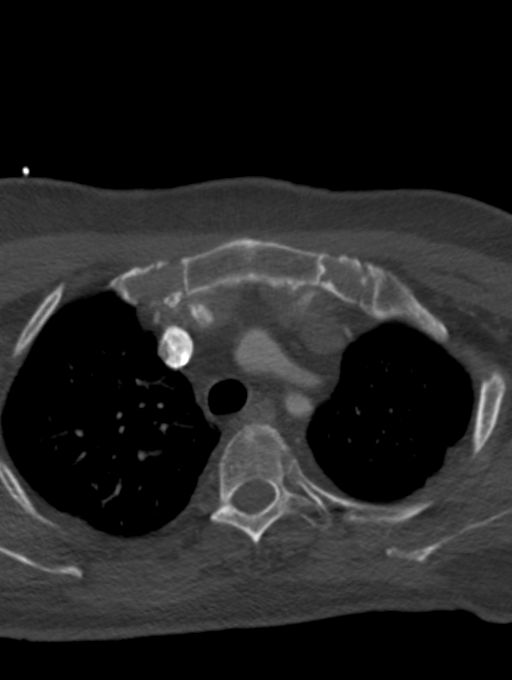
[im 107/285  soft-tissue]
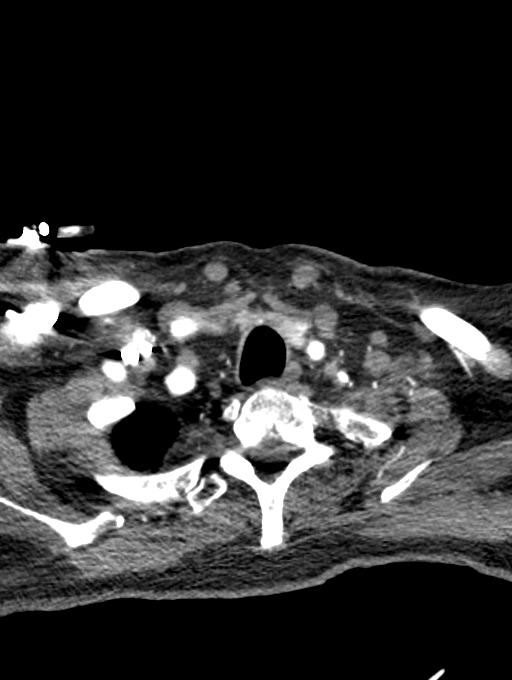
[im 143/285  bone]
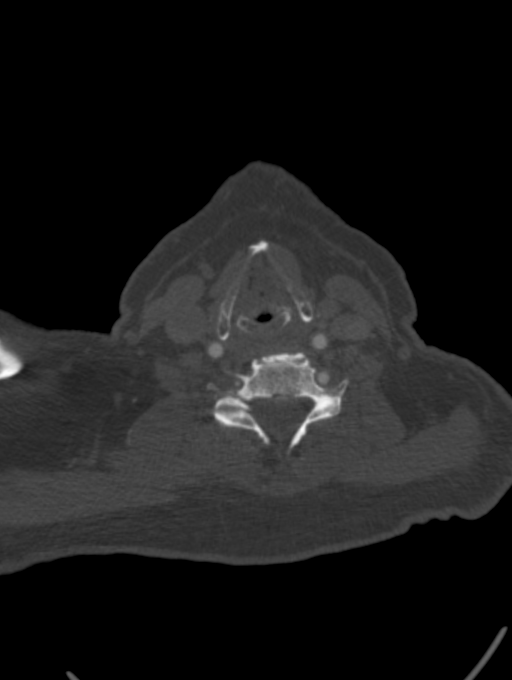
[im 178/285  soft-tissue]
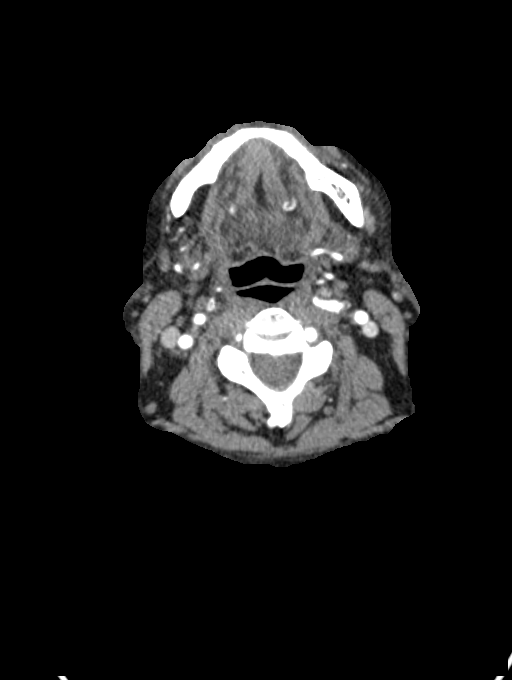
[im 214/285  bone]
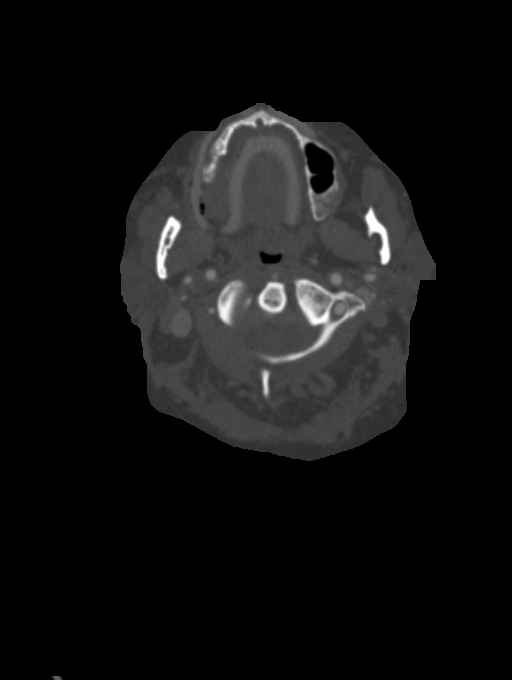
[im 249/285  soft-tissue]
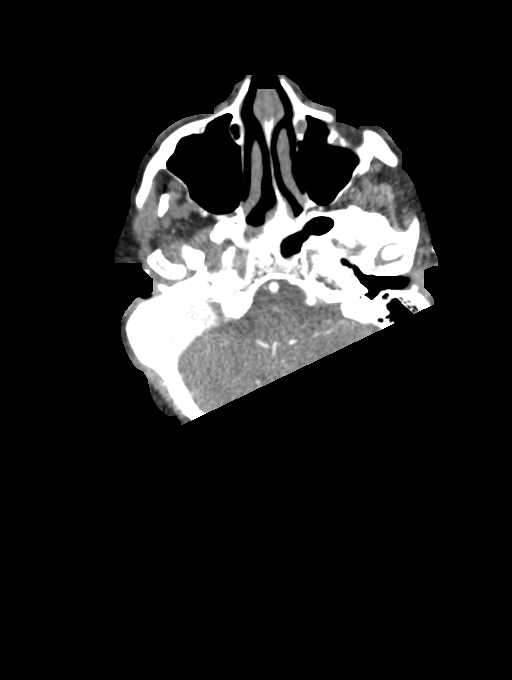

[Series 10: sagittal thin · sagittal · 0.54mm/px · 2 of 168 slices shown]
[im 41/168  soft-tissue]
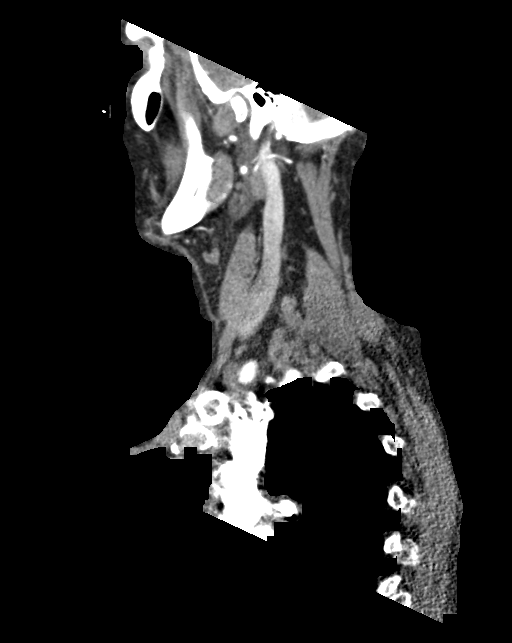
[im 128/168  soft-tissue]
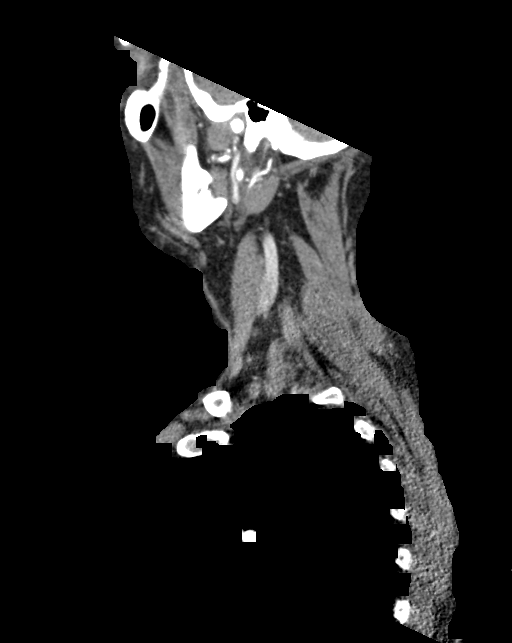

[9 of 36 positions shown; findings below may reference images not displayed]

FINDINGS: Aortic arch: Normal variant aortic arch branching pattern with
common origin of the brachiocephalic and left common carotid
arteries. Mild calcified plaque in the aortic arch without
significant arch vessel origin stenosis.

Right carotid system: Patent without evidence of stenosis or
dissection.

Left carotid system: Patent with mixed calcified and soft plaque at
the carotid bifurcation resulting in 25% ICA origin stenosis.

Vertebral arteries: 60% stenosis of the right subclavian artery at
its origin. Patent vertebral arteries without evidence of
significant stenosis or dissection. Strongly dominant left vertebral
artery.

Skeleton: Advanced disc space narrowing at C5-6 greater than C6-7.

Other neck: No evidence of cervical lymphadenopathy or mass.

Upper chest: Clear lung apices.
IMPRESSION: 1. 60% proximal right subclavian artery stenosis.
2. Patent cervical carotid and vertebral arteries without
significant stenosis.
3. Aortic Atherosclerosis ([id number]).

## 2021-08-27 NOTE — Patient Instructions (Signed)
-   continue clopidogrel '75mg'$  daily - continue atorvastatin, BP control, DM control

## 2021-08-27 NOTE — Progress Notes (Signed)
[facility]  PATIENT: Charles York DOB: [birth year]  REFERRING CLINICIAN: Derek Jack, MD HISTORY FROM: patient REASON FOR VISIT: new consult   HISTORICAL  CHIEF COMPLAINT:  Chief Complaint  Patient presents with   Follow-up    Rm 6 with daughter Dalene Seltzer Pt is well and stable, states no concerns been feeling good    HISTORY OF PRESENT ILLNESS:   88 year old male here for evaluation of TIA.  History of diabetes, hypertension, hyperlipidemia.  Presented to ER on 07/11/2021 for left-sided numbness and weakness lasting about 1 day.  Patient was admitted for work-up.  MRI brain, MRA head and neck were performed, unremarkable except for left vertebral stenosis of 40%.  Echocardiogram unremarkable.  Patient was treated with aspirin Plavix for 21 days and then Plavix alone.  Tolerating statin.  Overall doing well.   REVIEW OF SYSTEMS: Full 14 system review of systems performed and negative with exception of: as per HPI.  ALLERGIES: Allergies  Allergen Reactions   Trulicity [Dulaglutide] Swelling    Swollen lips, shortness of breath,    Buspirone     Dizziness   Penicillins Itching and Rash    Has patient had a PCN reaction causing immediate rash, facial/tongue/throat swelling, SOB or lightheadedness with hypotension: No Has patient had a PCN reaction causing severe rash involving mucus membranes or skin necrosis: No Has patient had a PCN reaction that required hospitalization: No Has patient had a PCN reaction occurring within the last 10 years: No If all of the above answers are "NO", then may proceed with Cephalosporin use.     HOME MEDICATIONS: Outpatient Medications Prior to Visit  Medication Sig Dispense Refill   atorvastatin (LIPITOR) 40 MG tablet Take 1 tablet (40 mg total) by mouth at bedtime. 90 tablet 3   Cholecalciferol 25 MCG (1000 UT) tablet TAKE TWO TABLETS BY MOUTH EVERY DAY AS NEEDED     clopidogrel (PLAVIX) 75 MG tablet Take 1 tablet (75  mg total) by mouth daily. 90 tablet 3   ferrous sulfate 325 (65 FE) MG tablet Take 325 mg by mouth every other day.     fluticasone (FLONASE) 50 MCG/ACT nasal spray Place into both nostrils daily.     hydroxypropyl methylcellulose / hypromellose (ISOPTO TEARS / GONIOVISC) 2.5 % ophthalmic solution Place 1 drop into both eyes 4 (four) times daily.     insulin aspart (NOVOLOG) 100 UNIT/ML injection Inject 12 Units into the skin in the morning and at bedtime. Inject 12 units in the morning, Inject 12 units in the evening, TAKE BEFORE MEALS 10 mL 11   LANTUS 100 UNIT/ML injection INJECT 27 UNITS SUBCUTANEOUSLY BEFORE BREAKFAST (Patient taking differently: 28 Units in the morning.) 10 mL 3   losartan (COZAAR) 25 MG tablet Take 12.5 mg by mouth daily.     pantoprazole (PROTONIX) 20 MG tablet Take 1 tablet (20 mg total) by mouth daily before breakfast. 90 tablet 3   senna (SENOKOT) 8.6 MG tablet Take 1 tablet by mouth as needed for constipation.     sodium chloride (MURO 128) 5 % ophthalmic solution INSTILL 1 DROP IN EACH EYE FOUR TIMES A DAY     sucralfate (CARAFATE) 1 g tablet Take 1 g by mouth 4 (four) times daily -  with meals and at bedtime.     No facility-administered medications prior to visit.    PAST MEDICAL HISTORY: Past Medical History:  Diagnosis Date   Anxiety    Arthritis    Cancer (  [place])    skin   Chronic kidney disease    stage 3   Colon polyp    Diabetes mellitus without complication ([place])    GERD (gastroesophageal reflux disease)    Heartburn    Hyperlipemia    Hypertension    Obesity     PAST SURGICAL HISTORY: Past Surgical History:  Procedure Laterality Date   CHOLECYSTECTOMY N/A 02/04/2018   Procedure: LAPAROSCOPIC CHOLECYSTECTOMY;  Surgeon: Johnathan Hausen, MD;  Location: [facility] ORS;  Service: General;  Laterality: N/A;   EYE SURGERY  11/2017   fluid in back of eye/ injection every 3 months. 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    Comment: retired  Tobacco Use   Smoking status: Former    Types: Cigarettes    Quit date: 02/04/1983    Years since quitting: 38.5   Smokeless tobacco: Former  Scientific laboratory technician Use: Never used  Substance and Sexual Activity   Alcohol use: No    Alcohol/week: 0.0 standard drinks of alcohol   Drug use: No   Sexual activity: Not on file  Other Topics Concern   Not on file  Social History Narrative   Not on file   Social Determinants of Health   Financial Resource Strain: [place]  (09/11/2020)   Overall Financial Resource Strain (CARDIA)    Difficulty of Paying Living Expenses: Not hard at all  Food Insecurity: No [place] (09/11/2020)   Hunger Vital Sign    Worried About Running Out of Food in the Last Year: Never true    [place] in the Last Year: Never true  Transportation Needs: No Transportation Needs (09/11/2020)   PRAPARE - Hydrologist (Medical): No    Lack of Transportation (Non-Medical): No  Physical Activity: Insufficiently Active (09/11/2020)   Exercise Vital Sign    Days of Exercise per Week: 4 days    Minutes of Exercise per Session: 30 min  Stress: No Stress Concern Present (09/11/2020)   [place]    Feeling of Stress : Not at all  Social Connections: Socially Isolated (09/06/2020)   Social Connection and Isolation Panel [NHANES]    Frequency of Communication with Friends and Family: More than three times a week    Frequency of Social  Gatherings with Friends and Family: More than three times a week    Attends Religious Services: Never    Marine scientist or Organizations: No    Attends Archivist Meetings: Never    Marital Status: Widowed  Intimate Partner Violence: Not At Risk (09/06/2020)   Humiliation, Afraid, Rape, and Kick questionnaire    Fear of Current or Ex-Partner: No    Emotionally Abused: No    Physically Abused: No    Sexually Abused: No     PHYSICAL EXAM  GENERAL EXAM/CONSTITUTIONAL: Vitals:  Vitals:  08/27/21 1143  BP: 115/64  Pulse: 69  Weight: 178 lb (80.7 kg)  Height: '5\' 5"'$  (1.651 m)   Body mass index is 29.62 kg/m. Wt Readings from Last 3 Encounters:  08/27/21 178 lb (80.7 kg)  07/16/21 177 lb (80.3 kg)  06/12/21 180 lb (81.6 kg)   Patient is in no distress; well developed, nourished and groomed; neck is supple  CARDIOVASCULAR: Examination of carotid arteries is normal; no carotid bruits Regular rate and rhythm, no murmurs Examination of peripheral vascular system by observation and palpation is normal  EYES: Ophthalmoscopic exam of optic discs and posterior segments is normal; no papilledema or hemorrhages No results found.  MUSCULOSKELETAL: Gait, strength, tone, movements noted in Neurologic exam below  NEUROLOGIC: MENTAL STATUS:      No data to display         awake, alert, oriented to person, place and time recent and remote memory intact normal attention and concentration language fluent, comprehension intact, naming intact fund of knowledge appropriate  CRANIAL NERVE:  2nd - no papilledema on fundoscopic exam 2nd, 3rd, 4th, 6th - pupils equal and reactive to light, visual fields full to confrontation, extraocular muscles intact, no nystagmus 5th - facial sensation symmetric 7th - facial strength symmetric 8th - hearing intact 9th - palate elevates symmetrically, uvula midline 11th - shoulder shrug symmetric 12th - tongue protrusion  midline  MOTOR:  normal bulk and tone, full strength in the BUE, BLE  SENSORY:  normal and symmetric to light touch, temperature, vibration  COORDINATION:  finger-nose-finger, fine finger movements normal  REFLEXES:  deep tendon reflexes TRACE and symmetric  GAIT/STATION:  narrow based gait     DIAGNOSTIC DATA (LABS, IMAGING, TESTING) - I reviewed patient records, labs, notes, testing and imaging myself where available.  Lab Results  Component Value Date   WBC 8.3 07/11/2021   HGB 12.2 (L) 07/11/2021   HCT 37.3 (L) 07/11/2021   MCV 97.9 07/11/2021   PLT 226 07/11/2021      Component Value Date/Time   NA 141 07/11/2021 0924   NA 139 10/28/2017 1051   K 4.7 07/11/2021 0924   CL 112 (H) 07/11/2021 0924   CO2 24 07/11/2021 0924   GLUCOSE 265 (H) 07/11/2021 0924   BUN 35 (H) 07/11/2021 0924   BUN 24 10/28/2017 1051   CREATININE 1.42 (H) 07/11/2021 0924   CREATININE 1.41 (H) 03/14/2021 0820   CALCIUM 8.6 (L) 07/11/2021 0924   PROT 7.0 07/11/2021 0924   PROT 7.0 10/28/2017 1051   ALBUMIN 4.0 07/11/2021 0924   ALBUMIN 4.5 10/28/2017 1051   AST 17 07/11/2021 0924   ALT 20 07/11/2021 0924   ALKPHOS 90 07/11/2021 0924   BILITOT 0.6 07/11/2021 0924   BILITOT 0.4 10/28/2017 1051   GFRNONAA 47 (L) 07/11/2021 0924   GFRNONAA 45 (L) 09/12/2019 0757   GFRAA 57 (L) 10/05/2019 1737   GFRAA 52 (L) 09/12/2019 0757   Lab Results  Component Value Date   CHOL 99 07/12/2021   HDL 37 (L) 07/12/2021   LDLCALC 45 07/12/2021   LDLDIRECT 60.9 07/11/2021   TRIG 86 07/12/2021   CHOLHDL 2.7 07/12/2021   Lab Results  Component Value Date   HGBA1C 7.3 (H) 07/11/2021   Lab Results  Component Value Date   FBPZWCHE52 778 07/15/2016   Lab Results  Component Value Date   TSH 3.58 10/12/2020    07/11/2021 echocardiogram 1. Left ventricular ejection fraction, by estimation, is 60 to 65%. The  left  ventricle has normal function. The left ventricle has no regional  wall motion  abnormalities. Left ventricular diastolic parameters were  normal.   2. Right ventricular systolic function is normal. The right ventricular  size is normal.   3. The mitral valve is normal in structure. Mild mitral valve  regurgitation.   4. The aortic valve is normal in structure. Aortic valve regurgitation is  not visualized.   07/11/21 MRI brain / MRA head 1. No evidence of acute intracranial abnormality. 2. No emergent large vessel occlusion or proximal hemodynamically significant stenosis.  07/12/2021 MRA neck 1. Approximately 40% stenosis of the left vertebral artery proximally. 2. Otherwise, no significant stenosis in the neck.   07/11/2021 carotid ultrasound 1. Right carotid artery system: Patent without significant atherosclerotic plaque formation. 2. Left carotid artery system: 50-69% stenosis secondary to moderate multifocal atherosclerotic plaque formation, most prominent the carotid bulb. 3.  Vertebral artery system: Patent with antegrade flow bilaterally. 4. Pulsus bisferiens throughout, as could be seen with aortic valvular disorder or hypertrophic cardiomyopathy.     ASSESSMENT AND PLAN  88 y.o. year old male here with:   Dx:  1. TIA (transient ischemic attack)       PLAN:  TIA (transient ischemic attack) --> left face / arm weakness, numbness - continue clopidogrel '75mg'$  daily - continue atorvastatin, BP control, DM control  ASYMPTOMATIC LEFT ICA STENOSIS (50-69% on u/s; no evident on MRA neck) - recommend med mgmt; follow up with PCP; consider vascular surgery consult / follow up for monitoring  Return for return to PCP, pending if symptoms worsen or fail to improve.    Penni Bombard, MD 0/53/9767, 3:41 PM Certified in Neurology, Neurophysiology and Neuroimaging  [facility] [address] [place], [place] [address] [phone]

## 2021-09-16 ENCOUNTER — Telehealth: Payer: Medicare Other

## 2021-09-16 ENCOUNTER — Telehealth: Payer: Self-pay

## 2021-09-16 ENCOUNTER — Ambulatory Visit (INDEPENDENT_AMBULATORY_CARE_PROVIDER_SITE_OTHER): Payer: Medicare Other

## 2021-09-16 VITALS — BP 128/55 | HR 73 | Temp 97.6°F | Resp 20 | Ht 66.5 in | Wt 181.8 lb

## 2021-09-16 DIAGNOSIS — Z0000 Encounter for general adult medical examination without abnormal findings: Secondary | ICD-10-CM

## 2021-09-16 NOTE — Progress Notes (Signed)
Subjective:   Charles York is a 88 y.o. male who presents for Medicare Annual/Subsequent preventive examination.  Review of Systems    Per HPI unless specifically indicated below Cardiac Risk Factors include: advanced age (>16mn, >>70women);male gender, hypertension, diabetes mellitus, and hyperlipidemia.           Objective:    Today's Vitals   09/16/21 1316 09/16/21 1323  BP: (!) 128/55   Pulse: 73   Resp: 20   Temp: 97.6 F (36.4 C)   TempSrc: Oral   SpO2: 98%   Weight: 181 lb 12.8 oz (82.5 kg)   Height: 5' 6.5" (1.689 m)   PainSc: 0-No pain 0-No pain   Body mass index is 28.9 kg/m.     07/11/2021    4:00 PM 07/11/2021    9:23 AM 09/15/2020    7:37 AM 09/11/2020   11:04 AM 01/20/2020    1:59 PM 10/05/2019    5:33 PM 09/06/2019   10:26 AM  Advanced Directives  Does Patient Have a Medical Advance Directive? Yes Yes Yes Yes No No No  Type of Advance Directive Living will Living [place]     Does patient want to make changes to medical advance directive? No - Patient declined        Copy of [place]?    No - copy requested       Current Medications (verified) Outpatient Encounter Medications as of 09/16/2021  Medication Sig   atorvastatin (LIPITOR) 40 MG tablet Take 1 tablet (40 mg total) by mouth at bedtime.   Cholecalciferol 25 MCG (1000 UT) tablet TAKE TWO TABLETS BY MOUTH EVERY York AS NEEDED   clopidogrel (PLAVIX) 75 MG tablet Take 1 tablet (75 mg total) by mouth daily.   ferrous sulfate 325 (65 FE) MG tablet Take 325 mg by mouth every other York.   fluticasone (FLONASE) 50 MCG/ACT nasal spray Place into both nostrils daily.   hydroxypropyl methylcellulose / hypromellose (ISOPTO TEARS / GONIOVISC) 2.5 % ophthalmic solution Place 1 drop into both eyes 4 (four) times daily.   insulin aspart (NOVOLOG) 100 UNIT/ML injection Inject 12 Units into the skin in the  morning and at bedtime. Inject 12 units in the morning, Inject 12 units in the evening, TAKE BEFORE MEALS   LANTUS 100 UNIT/ML injection INJECT 27 UNITS SUBCUTANEOUSLY BEFORE BREAKFAST (Patient taking differently: 28 Units in the morning.)   losartan (COZAAR) 25 MG tablet Take 12.5 mg by mouth daily.   pantoprazole (PROTONIX) 20 MG tablet Take 1 tablet (20 mg total) by mouth daily before breakfast.   senna (SENOKOT) 8.6 MG tablet Take 1 tablet by mouth as needed for constipation.   sodium chloride (MURO 128) 5 % ophthalmic solution INSTILL 1 DROP IN EACH EYE FOUR TIMES A York   sucralfate (CARAFATE) 1 g tablet Take 1 g by mouth 4 (four) times daily -  with meals and at bedtime.   No facility-administered encounter medications on file as of 09/16/2021.    Allergies (verified) Trulicity [dulaglutide], Buspirone, and Penicillins   History: Past Medical History:  Diagnosis Date   Anxiety    Arthritis    [place]cer [place]    skin   Chronic kidney disease    stage 3   Colon polyp    Diabetes mellitus without complication (HCC)    GERD (gastroesophageal reflux disease)    Heartburn    Hyperlipemia  Hypertension    Obesity    Past Surgical History:  Procedure Laterality Date   CHOLECYSTECTOMY N/A 02/04/2018   Procedure: LAPAROSCOPIC CHOLECYSTECTOMY;  Surgeon: Johnathan Hausen, MD;  Location: [facility] ORS;  Service: General;  Laterality: N/A;   EYE SURGERY  11/2017   fluid in back of eye/ injection every 3 months. 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    Comment: retired  Tobacco Use   Smoking  status: Former    Types: Cigarettes    Quit date: 02/04/1983    Years since quitting: 38.6   Smokeless tobacco: Former  Scientific laboratory technician Use: Never used  Substance and Sexual Activity   Alcohol use: No    Alcohol/week: 0.0 standard drinks of alcohol   Drug use: No   Sexual activity: Not on file  Other Topics Concern   Not on file  Social History Narrative   Not on file   Social Determinants of Health   Financial Resource Strain: Low Risk  (09/16/2021)   Overall Financial Resource Strain (CARDIA)    Difficulty of Paying Living Expenses: Not hard at all  Food Insecurity: No [place] (09/16/2021)   Hunger Vital Sign    Worried About Running Out of Food in the Last Year: Never true    [place] in the Last Year: Never true  Transportation Needs: No Transportation Needs (09/16/2021)   PRAPARE - Hydrologist (Medical): No    Lack of Transportation (Non-Medical): No  Physical Activity: Sufficiently Active (09/16/2021)   Exercise Vital Sign    Days of Exercise per Week: 4 days    Minutes of Exercise per Session: 40 min  Stress: No Stress Concern Present (09/16/2021)   [place]    Feeling of Stress : Only a little  Social Connections: Socially Isolated (09/16/2021)   Social Connection and Isolation Panel [NHANES]    Frequency of Communication with Friends and Family: Never    Frequency of Social Gatherings with Friends and Family: Never    Attends Religious Services: Never    Marine scientist or Organizations: No    Attends Archivist Meetings: Never    Marital Status: Widowed    Tobacco Counseling Counseling given: Not Answered   Clinical Intake:  Pre-visit preparation completed: No  Pain : No/denies pain Pain Score: 0-No pain     Nutritional Status: BMI 25 -29 Overweight Nutritional Risks: Nausea/ vomitting/ diarrhea (chronic nausea first thing  in the morning) Diabetes: Yes CBG done?: No CBG resulted in Enter/ Edit results?: Yes (137 Non fasting blood sugar checked at home) Did pt. bring in CBG monitor from home?: No  How often do you need to have someone help you when you read instructions, pamphlets, or other written materials from your doctor or pharmacy?: 1 - Never  Diabetic? Marland Kitchen Nutrition Risk Assessment:  Has the patient had any N/V/D within the last  2 months?  Yes  Does the patient have any non-healing wounds?  No  Has the patient had any unintentional weight loss or weight gain?  No   Diabetes:  Is the patient diabetic?  Yes  If diabetic, was a CBG obtained today?  No  Did the patient bring in their glucometer from home?  No  How often do you monitor your CBG's? Twice daily .   Financial Strains and Diabetes Management:  Are you having any financial strains with the device, your supplies or your medication? No .  Does the patient want to be seen by Chronic Care Management for management of their diabetes?  No  Would the patient like to be referred to a Nutritionist or for Diabetic Management?  No   Diabetic Exams:  Diabetic Eye Exam: Completed 11/13/2020 Diabetic Foot Exam: Completed 10/18/2020     Interpreter Needed?: No  Information entered by :: Donnie Mesa, cMA   Activities of Daily Living    09/16/2021    1:27 PM 07/11/2021    4:00 PM  In your present state of health, do you have any difficulty performing the following activities:  Hearing? 1 1  Vision? 1 1  Difficulty concentrating or making decisions? 0 0  Walking or climbing stairs? 1 0  Dressing or bathing? 0 0  Doing errands, shopping? 0 0    Patient Care Team: [name], DO as PCP - General (Family Medicine) Vanita Ingles, RN as Case Manager (General Practice) Rebekah Chesterfield, LCSW as Social Worker (Licensed Clinical Social Worker)  Indicate any recent [employer] you may have received from other than [facility]  providers in the past year (date may be approximate).  Pt [facility] hospital on 07/11/2021 for a TIA.     Assessment:   This is a routine wellness examination for [place].  Hearing/Vision screen Hearing loss. Pt currently wear a hearing aid. Bi-annual Eye Exam done at [facility].  [place]    Dietary issues and exercise activities discussed: Current Exercise Habits: Home exercise routine;Structured exercise class, Type of exercise: strength training/weights;Other - see comments (stationary bike), Time (Minutes): 40, Frequency (Times/Week): 4, Weekly Exercise (Minutes/Week): 160, Intensity: Moderate   Goals Addressed             This Visit's Progress    Stay Active and Independent       Why is this important?   Regular activity or exercise is important to managing back pain.  Activity helps to keep your muscles strong.  You will sleep better and feel more relaxed.  You will have more energy and feel less stressed.  If you are not active now, start slowly. Little changes make a big difference.  Rest, but not too much.  Stay as active as you can and listen to your body's signals.           Depression Screen    09/16/2021    1:19 PM 07/16/2021    8:20 AM 03/20/2021    8:11 AM 10/18/2020    8:52 AM 09/11/2020   11:05 AM 09/03/2020   10:47 AM 01/16/2020    8:59 AM  PHQ 2/9 Scores  PHQ - 2 Score 0 0 0 0 0 6 1  PHQ- 9 Score  0 0 0  15 2    Fall Risk    09/16/2021    1:27 PM 07/16/2021    8:19 AM 03/20/2021    8:11 AM  09/11/2020   11:05 AM 01/16/2020    8:50 AM  Fall Risk   Falls in the past year? 0 0 0 0 0  Number falls in past yr: 0 0 0  0  Injury with Fall? 0 0 0  0  Risk for fall due to : No Fall Risks Impaired balance/gait;History of fall(s) Impaired balance/gait;History of fall(s) Impaired balance/gait   Follow up Falls evaluation completed Falls evaluation completed Falls evaluation completed Falls evaluation  completed;Education provided;Falls prevention discussed Falls evaluation completed    FALL RISK PREVENTION PERTAINING TO THE HOME:  Any stairs in or around the home? No  If so, are there any without handrails? Yes  Home free of loose throw rugs in walkways, pet beds, electrical cords, etc? Yes  Adequate lighting in your home to reduce risk of falls? Yes   ASSISTIVE DEVICES UTILIZED TO PREVENT FALLS:  Life alert? No  Use of a cane, walker or w/c? No  Grab bars in the bathroom? No  Shower chair or bench in shower? No  Elevated toilet seat or a handicapped toilet? No   TIMED UP AND GO:  Was the test performed? Yes .  Length of time to ambulate 10 feet: 10 sec.   Gait slow and steady without use of assistive device  Cognitive Function:        09/16/2021    1:28 PM 09/11/2020   11:07 AM 09/06/2019   10:28 AM 03/30/2018    9:26 AM 11/04/2016   10:59 AM  6CIT Screen  What Year? 0 points 0 points 0 points 0 points 0 points  What month? 0 points 0 points 0 points 0 points 0 points  What time? 0 points 0 points 0 points 0 points 3 points  Count back from 20 0 points 0 points 0 points 0 points 0 points  Months in reverse 0 points 0 points 0 points 0 points 0 points  Repeat phrase 0 points 6 points 0 points 0 points 2 points  Total Score 0 points 6 points 0 points 0 points 5 points    Immunizations Immunization History  Administered Date(s) Administered   Fluad Quad(high Dose 65+) 10/14/2018, 10/12/2019, 10/18/2020, 11/06/2020   Influenza-Unspecified 02/04/1995, 01/06/1997, 11/03/1997, 02/16/1998, 12/14/1999, 12/21/2000, 01/13/2002, 12/12/2002, 12/14/2003, 11/18/2004, 12/04/2005, 10/08/2007, 11/12/2008, 11/17/2009, 10/09/2010, 10/29/2011, 10/04/2012, 12/04/2012, 11/03/2013, 11/08/2014, 10/27/2015, 10/29/2015, 10/17/2016, 10/25/2016, 11/12/2017, 11/24/2017, 11/04/2018, 10/14/2019   PFIZER(Purple Top)SARS-COV-2 Vaccination 03/03/2019, 03/28/2019, 11/22/2019   Pfizer Covid-19 Vaccine  Bivalent Booster 68yr & up 11/06/2020   Pneumococcal Conjugate-13 11/17/2012, 05/07/2016   Pneumococcal Polysaccharide-23 02/04/1996, 08/24/1996, 11/03/2007, 11/24/2017   Tdap 11/03/2007, 08/07/2017   Tetanus Immune Globulin 09/21/1996   Zoster Recombinat (Shingrix) 05/07/2016, 10/27/2016, 07/05/2018, 09/04/2018   Zoster, Live 07/20/2013    TDAP status: Up to date  Flu Vaccine status: Up to date  Pneumococcal vaccine status: Up to date  Covid-19 vaccine status: Completed vaccines  Qualifies for Shingles Vaccine? Yes   Zostavax completed Yes   Shingrix Completed?: Yes  Screening Tests Health Maintenance  Topic Date Due   COVID-19 Vaccine (5 - Pfizer risk series) 01/01/2021   INFLUENZA VACCINE  09/03/2021   FOOT EXAM  10/18/2021   OPHTHALMOLOGY EXAM  11/13/2021   HEMOGLOBIN A1C  01/10/2022   TETANUS/TDAP  08/08/2027   Pneumonia Vaccine 88 Years old  Completed   Zoster Vaccines- Shingrix  Completed   HPV VACCINES  Aged Out    Health Maintenance  Health Maintenance Due  Topic Date Due   COVID-19 Vaccine (5 -  Pfizer risk series) 01/01/2021   INFLUENZA VACCINE  09/03/2021   Colorectal Cancer Screening: No longer required   Lung Cancer Screening: (Low Dose CT Chest recommended if Age 22-80 years, 30 pack-year currently smoking OR have quit w/in 15years.) does not qualify.   Lung Cancer Screening Referral: not required   Additional Screening:  Hepatitis C Screening: does not qualify; no longer required   Vision Screening: Recommended annual ophthalmology exams for early detection of glaucoma and other disorders of the eye. Is the patient up to date with their annual eye exam?  Yes  Who is the provider or what is the name of the office in which the patient attends annual eye exams? [facility]  If pt is not established with a provider, would they like to be referred to a provider to establish care? No .   Dental Screening: Recommended annual dental exams for  proper oral hygiene  Community Resource Referral / Chronic Care Management: CRR required this visit?  No   CCM required this visit?  No      Plan:     I have personally reviewed and noted the following in the patient's chart:   Medical and social history Use of alcohol, tobacco or illicit drugs  Current medications and supplements including opioid prescriptions. Patient is not currently taking opioid prescriptions. Functional ability and status Nutritional status Physical activity Advanced directives List of other physicians Hospitalizations, surgeries, and ER visits in previous 12 months Vitals Screenings to include cognitive, depression, and falls Referrals and appointments  In addition, I have reviewed and discussed with patient certain preventive protocols, quality metrics, and best practice recommendations. A written personalized care plan for preventive services as well as general preventive health recommendations were provided to patient.    Mr. Larrabee , Thank you for taking time to come for your Medicare Wellness Visit. I appreciate your ongoing commitment to your health goals. Please review the following plan we discussed and let me know if I can assist you in the future.   These are the goals we discussed:  Goals       Patient Stated      09/11/2020, no goals      Stay Active and Independent      Why is this important?   Regular activity or exercise is important to managing back pain.  Activity helps to keep your muscles strong.  You will sleep better and feel more relaxed.  You will have more energy and feel less stressed.  If you are not active now, start slowly. Little changes make a big difference.  Rest, but not too much.  Stay as active as you can and listen to your body's signals.           SW-Manage My Emotions (pt-stated)      Follow Up Date- 08/15/20   Patient Goals: Attend all scheduled appointments with providers Contact office with any  questions or concerns Continue to utilize healthy coping skills to assist in management of symptoms      SW-Matintain My Quality of Life (pt-stated)      Follow Up Date-10/16/20  Patient Goals: Attend all scheduled appointments with providers Contact office with any questions or concerns Continue to utilize healthy coping skills to assist in management of symptoms      SW-Track and Manage My Symptoms-Depression      Timeframe:  Long-Range Goal Priority:  Medium Start Date:  04/03/20  Expected End Date:  12/03/20                   Follow Up Date- 10/16/20   Patient Goals: Attend all scheduled appointments with providers Contact office with any questions or concerns Continue to utilize healthy coping skills to assist in management of symptoms        This is a list of the screening recommended for you and due dates:  Health Maintenance  Topic Date Due   COVID-19 Vaccine (5 - Pfizer risk series) 01/01/2021   Flu Shot  09/03/2021   Complete foot exam   10/18/2021   Eye exam for diabetics  11/13/2021   Hemoglobin A1C  01/10/2022   Tetanus Vaccine  08/08/2027   Pneumonia Vaccine  Completed   Zoster (Shingles) Vaccine  Completed   HPV Vaccine  Aged [address], [place]   09/16/2021   Nurse Notes: Face-to-face 40 minute medicare wellness visit

## 2021-09-16 NOTE — Patient Instructions (Signed)
Screening for Type 2 Diabetes  A screening test for type 2 diabetes (type 2 diabetes mellitus) is a blood test to measure your blood sugar (glucose) level. This test is done to check for early signs of diabetes, before you develop symptoms.  Type 2 diabetes is a long-term (chronic) disease. In type 2 diabetes, one or both of these problems may be present: The pancreas does not make enough of a hormone called insulin. Cells in the body do not respond properly to insulin that the body makes (insulin resistance). Normally, insulin allows blood sugar (glucose) to enter cells in the body. The cells use glucose for energy. Insulin resistance or lack of insulin causes excess glucose to build up in the blood instead of going into cells. This results in high blood glucose levels (hyperglycemia), which can cause many complications. You may be screened for type 2 diabetes as part of your regular health care, especially if you have a high risk for diabetes. Screening can help to identify type 2 diabetes at its early stage (prediabetes). Identifying and treating prediabetes may delay or prevent the development of type 2 diabetes. Tell a health care provider about: All medicines you are taking, including vitamins, herbs, eye drops, creams, and over-the-counter medicines. Any bleeding problems you have. Any medical conditions you have. Whether you are pregnant or may be pregnant. Who should be screened for type 2 diabetes? Adults Adults age 81 and older. These adults should be screened once every three years. Adults who are any age, are overweight, and have one other risk factor. These adults should be screened once every three years. Adults who have normal blood glucose levels and two or more risk factors. These adults may be screened once every year (annually). Women who have had gestational diabetes in the past. These women should be screened once every three years. Pregnant women who have risk factors. These  women should be screened at their first prenatal visit and again between weeks 24 and 28 of pregnancy. Children and adolescents Children and adolescents should be screened for type 2 diabetes if they are overweight and have any of the following risk factors: A family history of type 2 diabetes. Being a member of a high-risk ethnic group. Signs of insulin resistance or conditions that are associated with insulin resistance. A mother who had gestational diabetes while pregnant. Screening should be done at least once every three years, starting at age 88 or at the onset of puberty, whichever comes first. Your health care provider or your child's health care provider may recommend having a screening more or less often. What are the risk factors for type 2 diabetes? The following are factors that may make you more likely to develop type 2 diabetes and can be modified: Not getting enough exercise. Having high blood pressure. Having low levels of good cholesterol (HDL-C) or high levels of blood fats (triglycerides). Having high blood glucose in a previous blood test. Being overweight or obese. The following are factors that may make you more likely to develop type 2 diabetes and can not be modified: Having a parent or sibling (first-degree relative) who has diabetes. Being of American-Indian, African-American, Hispanic/Latino, Asian, or [place] descent. Being older than age 78. Having a history of diabetes during pregnancy (gestational diabetes). Having certain diseases or conditions that may be caused by insulin resistance, including: Acanthosis nigricans. This is a condition that causes dark skin on the neck, armpits, and groin. Polycystic ovary syndrome (PCOS). Cardiovascular heart disease. What happens  during screening? During screening, your health care provider may ask questions about: Your health and your risk factors, including your activity level and any medical conditions that  you have. The health of your first-degree relatives. Past pregnancies, if this applies. Your health care provider will also do a physical exam, including a blood pressure measurement and blood tests. There are four blood tests that can be used to screen for type 2 diabetes. You may have one or more of the following: A fasting blood glucose (FBG) test. You will not be allowed to eat (you will fast) for 8 hours or more before a blood sample is taken. A random blood glucose test. This test checks your blood glucose at any time of the day regardless of when you ate. An oral glucose tolerance test (OGTT). This test measures your blood glucose at two times: After you have not eaten (have fasted) overnight. This is your baseline glucose level. Two hours after you drink a glucose-containing beverage. An A1C (hemoglobin A1C) blood test. This test provides information about blood glucose control over the previous 2-3 months. What do the results mean? Your test results are a measurement of how much glucose is in your blood. Normal blood glucose levels mean that you do not have diabetes or prediabetes. High blood glucose levels may mean that you have prediabetes or diabetes. Depending on the results, other tests may be needed to confirm the diagnosis. You may be diagnosed with type 2 diabetes if: Your FBG level is 126 mg/dL (7.0 mmol/L) or higher. Your random blood glucose level is 200 mg/dL (11.1 mmol/L) or higher. Your A1C level is 6.5% or higher. Your OGTT result is higher than 200 mg/dL (11.1 mmol/L). These blood tests may be repeated to confirm your diagnosis. Talk with your health care provider about what your results mean. Summary A screening test for type 2 diabetes (type 2 diabetes mellitus) is a blood test to measure your blood sugar (glucose) level. Know what your risk factors are for developing type 2 diabetes. If you are at risk, get screening tests as often as told by your health care  provider. Screening may help you identify type 2 diabetes at its early stage (prediabetes). Identifying and treating prediabetes may delay or prevent the development of type 2 diabetes. This information is not intended to replace advice given to you by your health care provider. Make sure you discuss any questions you have with your health care provider. Document Revised: 04/16/2020 Document Reviewed: 04/16/2020 Elsevier Patient Education  [place] Maintenance, Male Adopting a healthy lifestyle and getting preventive care are important in promoting health and wellness. Ask your health care provider about: The right schedule for you to have regular tests and exams. Things you can do on your own to prevent diseases and keep yourself healthy. What should I know about diet, weight, and exercise? Eat a healthy diet  Eat a diet that includes plenty of vegetables, fruits, low-fat dairy products, and lean protein. Do not eat a lot of foods that are high in solid fats, added sugars, or sodium. Maintain a healthy weight Body mass index (BMI) is a measurement that can be used to identify possible weight problems. It estimates body fat based on height and weight. Your health care provider can help determine your BMI and help you achieve or maintain a healthy weight. Get regular exercise Get regular exercise. This is one of the most important things you can do for your health. Most adults  should: Exercise for at least 150 minutes each week. The exercise should increase your heart rate and make you sweat (moderate-intensity exercise). Do strengthening exercises at least twice a week. This is in addition to the moderate-intensity exercise. Spend less time sitting. Even light physical activity can be beneficial. Watch cholesterol and blood lipids Have your blood tested for lipids and cholesterol at 86 years of age, then have this test every 5 years. You may need to have your cholesterol  levels checked more often if: Your lipid or cholesterol levels are high. You are older than 86 years of age. You are at high risk for heart disease. What should I know about cancer screening? Many types of cancers can be detected early and may often be prevented. Depending on your health history and family history, you may need to have cancer screening at various ages. This may include screening for: Colorectal cancer. Prostate cancer. Skin cancer. Lung cancer. What should I know about heart disease, diabetes, and high blood pressure? Blood pressure and heart disease High blood pressure causes heart disease and increases the risk of stroke. This is more likely to develop in people who have high blood pressure readings or are overweight. Talk with your health care provider about your target blood pressure readings. Have your blood pressure checked: Every 3-5 years if you are 18-33 years of age. Every year if you are 27 years old or older. If you are between the ages of 60 and 6 and are a current or former smoker, ask your health care provider if you should have a one-time screening for abdominal aortic aneurysm (AAA). Diabetes Have regular diabetes screenings. This checks your fasting blood sugar level. Have the screening done: Once every three years after age 52 if you are at a normal weight and have a low risk for diabetes. More often and at a younger age if you are overweight or have a high risk for diabetes. What should I know about preventing infection? Hepatitis B If you have a higher risk for hepatitis B, you should be screened for this virus. Talk with your health care provider to find out if you are at risk for hepatitis B infection. Hepatitis C Blood testing is recommended for: Everyone born from [birth year] through 1963. Anyone with known risk factors for hepatitis C. Sexually transmitted infections (STIs) You should be screened each year for STIs, including gonorrhea and chlamydia,  if: You are sexually active and are younger than 86 years of age. You are older than 86 years of age and your health care provider tells you that you are at risk for this type of infection. Your sexual activity has changed since you were last screened, and you are at increased risk for chlamydia or gonorrhea. Ask your health care provider if you are at risk. Ask your health care provider about whether you are at high risk for HIV. Your health care provider may recommend a prescription medicine to help prevent HIV infection. If you choose to take medicine to prevent HIV, you should first get tested for HIV. You should then be tested every 3 months for as long as you are taking the medicine. Follow these instructions at home: Alcohol use Do not drink alcohol if your health care provider tells you not to drink. If you drink alcohol: Limit how much you have to 0-2 drinks a day. Know how much alcohol is in your drink. In the [place] one drink equals one 12 oz bottle of beer (355 mL),  one 5 oz glass of wine (148 mL), or one 1 oz glass of hard liquor (44 mL). Lifestyle Do not use any products that contain nicotine or tobacco. These products include cigarettes, chewing tobacco, and vaping devices, such as e-cigarettes. If you need help quitting, ask your health care provider. Do not use street drugs. Do not share needles. Ask your health care provider for help if you need support or information about quitting drugs. General instructions Schedule regular health, dental, and eye exams. Stay current with your vaccines. Tell your health care provider if: You often feel depressed. You have ever been abused or do not feel safe at home. Summary Adopting a healthy lifestyle and getting preventive care are important in promoting health and wellness. Follow your health care provider's instructions about healthy diet, exercising, and getting tested or screened for diseases. Follow your health care provider's  instructions on monitoring your cholesterol and blood pressure. This information is not intended to replace advice given to you by your health care provider. Make sure you discuss any questions you have with your health care provider. Document Revised: 06/11/2020 Document Reviewed: 06/11/2020 Elsevier Patient Education  [place].

## 2021-09-16 NOTE — Telephone Encounter (Signed)
  Care Management   Follow Up Note   09/16/2021 Name: Charles York MRN: [id number] DOB: [birth year]   Referred by: [name], DO Reason for referral : Chronic Care Management (RNCM: Follow up for Chronic Disease Management and Care Coordination Needs)   An unsuccessful telephone outreach was attempted today. The patient was referred to the case management team for assistance with care management and care coordination.   Follow Up Plan: No further follow up required: the patient has met the goals of care and no further outreach needed at this time.   Noreene Larsson RN, MSN, [place] Mobile: [phone]

## 2021-09-17 ENCOUNTER — Ambulatory Visit: Payer: Medicare Other

## 2021-10-10 ENCOUNTER — Other Ambulatory Visit: Payer: Self-pay

## 2021-10-10 DIAGNOSIS — R351 Nocturia: Secondary | ICD-10-CM

## 2021-10-10 DIAGNOSIS — Z0000 Encounter for general adult medical examination without abnormal findings: Secondary | ICD-10-CM

## 2021-10-10 DIAGNOSIS — E1169 Type 2 diabetes mellitus with other specified complication: Secondary | ICD-10-CM

## 2021-10-10 DIAGNOSIS — E1140 Type 2 diabetes mellitus with diabetic neuropathy, unspecified: Secondary | ICD-10-CM

## 2021-10-10 DIAGNOSIS — I129 Hypertensive chronic kidney disease with stage 1 through stage 4 chronic kidney disease, or unspecified chronic kidney disease: Secondary | ICD-10-CM

## 2021-10-11 ENCOUNTER — Other Ambulatory Visit: Payer: Medicare Other

## 2021-10-11 DIAGNOSIS — Z0000 Encounter for general adult medical examination without abnormal findings: Secondary | ICD-10-CM | POA: Diagnosis not present

## 2021-10-11 DIAGNOSIS — E785 Hyperlipidemia, unspecified: Secondary | ICD-10-CM | POA: Diagnosis not present

## 2021-10-11 DIAGNOSIS — I10 Essential (primary) hypertension: Secondary | ICD-10-CM | POA: Diagnosis not present

## 2021-10-11 DIAGNOSIS — E1169 Type 2 diabetes mellitus with other specified complication: Secondary | ICD-10-CM | POA: Diagnosis not present

## 2021-10-12 LAB — COMPLETE METABOLIC PANEL WITH GFR
AG Ratio: 1.6 (calc) (ref 1.0–2.5)
ALT: 17 U/L (ref 9–46)
AST: 11 U/L (ref 10–35)
Albumin: 3.9 g/dL (ref 3.6–5.1)
Alkaline phosphatase (APISO): 80 U/L (ref 35–144)
BUN/Creatinine Ratio: 22 (calc) (ref 6–22)
BUN: 32 mg/dL — ABNORMAL HIGH (ref 7–25)
CO2: 23 mmol/L (ref 20–32)
Calcium: 8.6 mg/dL (ref 8.6–10.3)
Chloride: 107 mmol/L (ref 98–110)
Creat: 1.45 mg/dL — ABNORMAL HIGH (ref 0.70–1.22)
Globulin: 2.4 g/dL (calc) (ref 1.9–3.7)
Glucose, Bld: 247 mg/dL — ABNORMAL HIGH (ref 65–99)
Potassium: 4.3 mmol/L (ref 3.5–5.3)
Sodium: 139 mmol/L (ref 135–146)
Total Bilirubin: 0.4 mg/dL (ref 0.2–1.2)
Total Protein: 6.3 g/dL (ref 6.1–8.1)
eGFR: 46 mL/min/{1.73_m2} — ABNORMAL LOW (ref >=60)

## 2021-10-12 LAB — LIPID PANEL
Cholesterol: 109 mg/dL (ref <200)
HDL: 41 mg/dL (ref >=40)
LDL Cholesterol (Calc): 49 mg/dL (calc)
Non-HDL Cholesterol (Calc): 68 mg/dL (calc) (ref <130)
Total CHOL/HDL Ratio: 2.7 (calc) (ref <5.0)
Triglycerides: 102 mg/dL (ref <150)

## 2021-10-12 LAB — CBC WITH DIFFERENTIAL/PLATELET
Absolute Monocytes: 554 cells/uL (ref 200–950)
Basophils Absolute: 39 cells/uL (ref 0–200)
Basophils Relative: 0.5 %
Eosinophils Absolute: 117 cells/uL (ref 15–500)
Eosinophils Relative: 1.5 %
HCT: 35.8 % — ABNORMAL LOW (ref 38.5–50.0)
Hemoglobin: 12.2 g/dL — ABNORMAL LOW (ref 13.2–17.1)
Lymphs Abs: 3276 cells/uL (ref 850–3900)
MCH: 33.6 pg — ABNORMAL HIGH (ref 27.0–33.0)
MCHC: 34.1 g/dL (ref 32.0–36.0)
MCV: 98.6 fL (ref 80.0–100.0)
MPV: 11.3 fL (ref 7.5–12.5)
Monocytes Relative: 7.1 %
Neutro Abs: 3814 cells/uL (ref 1500–7800)
Neutrophils Relative %: 48.9 %
Platelets: 209 10*3/uL (ref 140–400)
RBC: 3.63 10*6/uL — ABNORMAL LOW (ref 4.20–5.80)
RDW: 11.5 % (ref 11.0–15.0)
Total Lymphocyte: 42 %
WBC: 7.8 10*3/uL (ref 3.8–10.8)

## 2021-10-12 LAB — HEMOGLOBIN A1C
Hgb A1c MFr Bld: 7 % of total Hgb — ABNORMAL HIGH (ref <5.7)
Mean Plasma Glucose: 154 mg/dL
eAG (mmol/L): 8.5 mmol/L

## 2021-10-12 LAB — PSA: PSA: 0.5 ng/mL (ref <=4.00)

## 2021-10-18 ENCOUNTER — Encounter: Payer: Self-pay | Admitting: Family Medicine

## 2021-10-18 ENCOUNTER — Ambulatory Visit (INDEPENDENT_AMBULATORY_CARE_PROVIDER_SITE_OTHER): Payer: Medicare Other | Admitting: Family Medicine

## 2021-10-18 VITALS — BP 102/43 | HR 102 | Ht 66.5 in | Wt 180.6 lb

## 2021-10-18 DIAGNOSIS — E1169 Type 2 diabetes mellitus with other specified complication: Secondary | ICD-10-CM

## 2021-10-18 DIAGNOSIS — N183 Chronic kidney disease, stage 3 unspecified: Secondary | ICD-10-CM

## 2021-10-18 DIAGNOSIS — E1140 Type 2 diabetes mellitus with diabetic neuropathy, unspecified: Secondary | ICD-10-CM

## 2021-10-18 DIAGNOSIS — E785 Hyperlipidemia, unspecified: Secondary | ICD-10-CM

## 2021-10-18 DIAGNOSIS — I129 Hypertensive chronic kidney disease with stage 1 through stage 4 chronic kidney disease, or unspecified chronic kidney disease: Secondary | ICD-10-CM | POA: Diagnosis not present

## 2021-10-18 DIAGNOSIS — Z0000 Encounter for general adult medical examination without abnormal findings: Secondary | ICD-10-CM

## 2021-10-18 DIAGNOSIS — K2100 Gastro-esophageal reflux disease with esophagitis, without bleeding: Secondary | ICD-10-CM

## 2021-10-18 MED ORDER — PANTOPRAZOLE SODIUM 40 MG PO TBEC: 40.0000 mg | DELAYED_RELEASE_TABLET | Freq: Every day | ORAL | 3 refills | Status: DC

## 2021-10-18 NOTE — Patient Instructions (Addendum)
Thank you for coming to the office today.  Stomach acid For the Pantoprazole (protonix) '20mg'$  take TWO now of the old pills, can take 2 at once in morning. If that does not work you can try taking 1 in morning and 1 in evening. I have ordered a new stronger dose with Pantoprazole '40mg'$  daily to your [facility].   Keep on the Plavix  Okay to take the Lantus insulin even if have smaller meal.  Skip the mealtime insulin if not eating.  Recent Labs    03/14/21 0820 07/11/21 0924 10/11/21 0824  HGBA1C 7.5* 7.3* 7.0*   Due for COVID Booster when available.  Consider the RSV vaccine if you like. It is optional age 15+ but usually higher risk lung and or heart patients required.   Please schedule a Follow-up Appointment to: Return in about 6 months (around 04/18/2022) for 6 month follow-up DM A1c, HTN.  If you have any other questions or concerns, please feel free to call the office or send a message through [place]. You may also schedule an earlier appointment if necessary.  Additionally, you may be receiving a survey about your experience at our office within a few days to 1 week by e-mail or mail. We value your feedback.  Nobie Putnam, DO [place]

## 2021-10-18 NOTE — Progress Notes (Unsigned)
Subjective:    Patient ID: Charles York, male    DOB: May 22, 1930, 88 y.o.   MRN: [id number]  Charles York is a 88 y.o. male presenting on 10/18/2021 for Annual Exam   HPI  Here for Annual Physical and lab Review  History of TIA Last seen [facility] 08/2021. Finished DAPT ASA Plavix 21 days then now Plavix Monotherapy. He feels better overall with the plavix.   CHRONIC HTN CKD-III Improved w/ less med Home BP and in office avg DBP high 40s low 50s up to 60s He feels well Creatinine 1.45 stable from past 1 year. Past history 1.58, now improved On Losartan 34m daily Drinking plenty of water. Reports good compliance, took meds today. Tolerating well, w/o complaints. Denies CP, dyspnea, HA, edema, dizziness / lightheadedness   CHRONIC DM, Type 2: Reports no concerns A1c stable at 7.0% previously 7.2 to 7.6 over past 3 years. CBGs: Avg 150 and under Meds: Lantus 27 units daily and Novolog mix 12 u BID Reports  good compliance. Tolerating well w/o side-effects No longer on ARB Lifestyle: - Diet (improved)  In past 1 year - Admits 2 episode of hypoglycemia predictable with skipped meal and he felt classic symptoms he identified promptly and ate to fix it. Last DM Eye exam 11/13/20   HYPERLIPIDEMIA: - Reports no concerns. Last lipid panel 10/2021, controlled  - Currently taking Atorvastatin, tolerating well without side effects or myalgias   Major Depression, recurrent in remission GAD Anxiety Interval updates - prior history on sertraline (too strong or side effect), lexapro was successful but then came off med not needed, buspar caused dizziness Was on Wellbutrin and Lexapro Currently doing well   Osteoarthritis Bilateral Shoulders and Knee, Pain Reports chronic problem with joint pain Takes Osteobiflex with good results He uses stationary bicycle      Health Maintenance:   High dose flu shot today.  PSA 0.5 10/2021     10/18/2021    9:46 AM 09/16/2021    1:19  PM 07/16/2021    8:20 AM  Depression screen PHQ 2/9  Decreased Interest 0 0 0  Down, Depressed, Hopeless 0 0 0  PHQ - 2 Score 0 0 0  Altered sleeping 0  0  Tired, decreased energy 0  0  Change in appetite 0  0  Feeling bad or failure about yourself  0  0  Trouble concentrating 0  0  Moving slowly or fidgety/restless 0  0  Suicidal thoughts 0  0  PHQ-9 Score 0  0  Difficult doing work/chores Not difficult at all  Not difficult at all      10/18/2021    9:47 AM 07/16/2021    8:20 AM 03/20/2021    8:11 AM 09/03/2020   10:47 AM  GAD 7 : Generalized Anxiety Score  Nervous, Anxious, on Edge 0 0 3 3  Control/stop worrying 0 0 3 3  Worry too much - different things 0 0 3 3  Trouble relaxing 0 0 3 3  Restless 0 0 1 1  Easily annoyed or irritable 0 0 3 3  Afraid - awful might happen 0 0 3 3  Total GAD 7 Score 0 0 19 19  Anxiety Difficulty Not difficult at all Not difficult at all Somewhat difficult Somewhat difficult      Past Medical History:  Diagnosis Date   Anxiety    Arthritis    [place]   Chronic kidney disease  stage 3   Colon polyp    Diabetes mellitus without complication (HCC)    GERD (gastroesophageal reflux disease)    Heartburn    Hyperlipemia    Hypertension    Obesity    Past Surgical History:  Procedure Laterality Date   CHOLECYSTECTOMY N/A 02/04/2018   Procedure: LAPAROSCOPIC CHOLECYSTECTOMY;  Surgeon: Johnathan Hausen, MD;  Location: [facility] ORS;  Service: General;  Laterality: N/A;   EYE SURGERY  11/2017   fluid in back of eye/ injection every 3 months. 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    Comment: retired  Tobacco Use   Smoking status: Former     Types: Cigarettes    Quit date: 02/04/1983    Years since quitting: 38.7   Smokeless tobacco: Former  Scientific laboratory technician Use: Never used  Substance and Sexual Activity   Alcohol use: No    Alcohol/week: 0.0 standard drinks of alcohol   Drug use: No   Sexual activity: Not on file  Other Topics Concern   Not on file  Social History Narrative   Not on file   Social Determinants of Health   Financial Resource Strain: Low Risk  (09/16/2021)   Overall Financial Resource Strain (CARDIA)    Difficulty of Paying Living Expenses: Not hard at all  Food Insecurity: No Food Insecurity (09/16/2021)   Hunger Vital Sign    Worried About Running Out of Food in the Last Year: Never true    [place] in the Last Year: Never true  Transportation Needs: No Transportation Needs (09/16/2021)   PRAPARE - Hydrologist (Medical): No    Lack of Transportation (Non-Medical): No  Physical Activity: Sufficiently Active (09/16/2021)   Exercise Vital Sign    Days of Exercise per Week: 4 days    Minutes of Exercise per Session: 40 min  Stress: No Stress Concern Present (09/16/2021)   Stow    Feeling of Stress : Only a little  Social Connections: Socially Isolated (09/16/2021)   Social Connection and Isolation Panel [NHANES]    Frequency of Communication with Friends and Family: Never    Frequency of Social Gatherings with Friends and Family: Never    Attends Religious Services: Never    Marine scientist or Organizations: No    Attends Archivist Meetings: Never    Marital Status: Widowed  Intimate Partner Violence: Not At Risk (09/16/2021)   Humiliation, Afraid, Rape, and Kick questionnaire    Fear of Current or Ex-Partner: No    Emotionally Abused: No    Physically Abused: No    Sexually Abused: No   Family History  Problem Relation Age of Onset   Cancer Father        carcinoma    Alzheimer's disease Mother    Current Outpatient Medications on File Prior to Visit  Medication Sig   atorvastatin (LIPITOR) 40 MG tablet Take 1 tablet (40 mg total) by mouth at bedtime.   Cholecalciferol 25 MCG (1000 UT) tablet TAKE TWO TABLETS BY MOUTH EVERY DAY AS NEEDED  clopidogrel (PLAVIX) 75 MG tablet Take 1 tablet (75 mg total) by mouth daily.   ferrous sulfate 325 (65 FE) MG tablet Take 325 mg by mouth every other day.   fluticasone (FLONASE) 50 MCG/ACT nasal spray Place into both nostrils daily.   hydroxypropyl methylcellulose / hypromellose (ISOPTO TEARS / GONIOVISC) 2.5 % ophthalmic solution Place 1 drop into both eyes 4 (four) times daily.   insulin aspart (NOVOLOG) 100 UNIT/ML injection Inject 12 Units into the skin in the morning and at bedtime. Inject 12 units in the morning, Inject 12 units in the evening, TAKE BEFORE MEALS   LANTUS 100 UNIT/ML injection INJECT 27 UNITS SUBCUTANEOUSLY BEFORE BREAKFAST (Patient taking differently: 28 Units in the morning.)   losartan (COZAAR) 25 MG tablet Take 12.5 mg by mouth daily.   senna (SENOKOT) 8.6 MG tablet Take 1 tablet by mouth as needed for constipation.   sodium chloride (MURO 128) 5 % ophthalmic solution INSTILL 1 DROP IN EACH EYE FOUR TIMES A DAY   sucralfate (CARAFATE) 1 g tablet Take 1 g by mouth 4 (four) times daily -  with meals and at bedtime.   No current facility-administered medications on file prior to visit.    Review of Systems  Constitutional:  Negative for activity change, appetite change, chills, diaphoresis, fatigue and fever.  HENT:  Negative for congestion and hearing loss.   Eyes:  Negative for visual disturbance.  Respiratory:  Negative for cough, chest tightness, shortness of breath and wheezing.   Cardiovascular:  Negative for chest pain, palpitations and leg swelling.  Gastrointestinal:  Negative for abdominal pain, constipation, diarrhea, nausea and vomiting.  Genitourinary:  Negative for dysuria,  frequency and hematuria.  Musculoskeletal:  Negative for arthralgias and neck pain.  Skin:  Negative for rash.  Neurological:  Negative for dizziness, weakness, light-headedness, numbness and headaches.  Hematological:  Negative for adenopathy.  Psychiatric/Behavioral:  Negative for behavioral problems, dysphoric mood and sleep disturbance.    Per HPI unless specifically indicated above      Objective:    BP (!) 102/43   Pulse (!) 102   Ht 5' 6.5" (1.689 m)   Wt 180 lb 9.6 oz (81.9 kg)   SpO2 98%   BMI 28.71 kg/m   Wt Readings from Last 3 Encounters:  10/18/21 180 lb 9.6 oz (81.9 kg)  09/16/21 181 lb 12.8 oz (82.5 kg)  08/27/21 178 lb (80.7 kg)    Physical Exam Vitals and nursing note reviewed.  Constitutional:      General: He is not in acute distress.    Appearance: He is well-developed. He is not diaphoretic.     Comments: Well-appearing, comfortable, cooperative  HENT:     Head: Normocephalic and atraumatic.  Eyes:     General:        Right eye: No discharge.        Left eye: No discharge.     Conjunctiva/sclera: Conjunctivae normal.     Pupils: Pupils are equal, round, and reactive to light.  Neck:     Thyroid: No thyromegaly.  Cardiovascular:     Rate and Rhythm: Normal rate and regular rhythm.     Pulses: Normal pulses.     Heart sounds: Normal heart sounds. No murmur heard. Pulmonary:     Effort: Pulmonary effort is normal. No respiratory distress.     Breath sounds: Normal breath sounds. No wheezing or rales.  Abdominal:     General: Bowel sounds are normal. There is no distension.  Palpations: Abdomen is soft. There is no mass.     Tenderness: There is no abdominal tenderness.  Musculoskeletal:        General: No tenderness. Normal range of motion.     Cervical back: Normal range of motion and neck supple.     Comments: Upper / Lower Extremities: - Normal muscle tone, strength bilateral upper extremities 5/5, lower extremities 5/5   Lymphadenopathy:     Cervical: No cervical adenopathy.  Skin:    General: Skin is warm and dry.     Findings: No erythema or rash.  Neurological:     Mental Status: He is alert and oriented to person, place, and time.     Comments: Distal sensation intact to light touch all extremities  Psychiatric:        Mood and Affect: Mood normal.        Behavior: Behavior normal.        Thought Content: Thought content normal.     Comments: Well groomed, good eye contact, normal speech and thoughts       Results for orders placed or performed in visit on 10/10/21  PSA  Result Value Ref Range   PSA 0.50 < OR = 4.00 ng/mL  Lipid panel  Result Value Ref Range   Cholesterol 109 <200 mg/dL   HDL 41 > OR = 40 mg/dL   Triglycerides 102 <150 mg/dL   LDL Cholesterol (Calc) 49 mg/dL (calc)   Total CHOL/HDL Ratio 2.7 <5.0 (calc)   Non-HDL Cholesterol (Calc) 68 <130 mg/dL (calc)  COMPLETE METABOLIC PANEL WITH GFR  Result Value Ref Range   Glucose, Bld 247 (H) 65 - 99 mg/dL   BUN 32 (H) 7 - 25 mg/dL   Creat 1.45 (H) 0.70 - 1.22 mg/dL   eGFR 46 (L) > OR = 60 mL/min/1.69m   BUN/Creatinine Ratio 22 6 - 22 (calc)   Sodium 139 135 - 146 mmol/L   Potassium 4.3 3.5 - 5.3 mmol/L   Chloride 107 98 - 110 mmol/L   CO2 23 20 - 32 mmol/L   Calcium 8.6 8.6 - 10.3 mg/dL   Total Protein 6.3 6.1 - 8.1 g/dL   Albumin 3.9 3.6 - 5.1 g/dL   Globulin 2.4 1.9 - 3.7 g/dL (calc)   AG Ratio 1.6 1.0 - 2.5 (calc)   Total Bilirubin 0.4 0.2 - 1.2 mg/dL   Alkaline phosphatase (APISO) 80 35 - 144 U/L   AST 11 10 - 35 U/L   ALT 17 9 - 46 U/L  Hemoglobin A1c  Result Value Ref Range   Hgb A1c MFr Bld 7.0 (H) <5.7 % of total Hgb   Mean Plasma Glucose 154 mg/dL   eAG (mmol/L) 8.5 mmol/L  CBC with Differential/Platelet  Result Value Ref Range   WBC 7.8 3.8 - 10.8 Thousand/uL   RBC 3.63 (L) 4.20 - 5.80 Million/uL   Hemoglobin 12.2 (L) 13.2 - 17.1 g/dL   HCT 35.8 (L) 38.5 - 50.0 %   MCV 98.6 80.0 - 100.0 fL   MCH  33.6 (H) 27.0 - 33.0 pg   MCHC 34.1 32.0 - 36.0 g/dL   RDW 11.5 11.0 - 15.0 %   Platelets 209 140 - 400 Thousand/uL   MPV 11.3 7.5 - 12.5 fL   Neutro Abs 3,814 1,500 - 7,800 cells/uL   Lymphs Abs 3,276 850 - 3,900 cells/uL   Absolute Monocytes 554 200 - 950 cells/uL   Eosinophils Absolute 117 15 - 500 cells/uL  Basophils Absolute 39 0 - 200 cells/uL   Neutrophils Relative % 48.9 %   Total Lymphocyte 42.0 %   Monocytes Relative 7.1 %   Eosinophils Relative 1.5 %   Basophils Relative 0.5 %      Assessment & Plan:   Problem List Items Addressed This Visit     Benign hypertension with CKD (chronic kidney disease) stage III (HCC)    Well-controlled HTN - Home BP readings controlled  Complication with CKD-III    Plan:  1. Continue Losartan ARB 12.67m daily half of 267m2. Encourage improved lifestyle - low sodium diet, regular exercise 3. Continue monitor BP outside office, bring readings to next visit, if persistently >140/90 or new symptoms notify office sooner. Or if low BP < 100/70 and symptomatic      GERD (gastroesophageal reflux disease)    Continue Pantoprazole On this due to plavix      Relevant Medications   pantoprazole (PROTONIX) 40 MG tablet   Hyperlipidemia associated with type 2 diabetes mellitus (HCC)    On Statin Calculated ASCVD 10 yr risk score elevated due to DM, age  Plan: 1. Continue current meds - Atorvastatin 4051maily 2. Continue ASA 52m26mr primary ASCVD risk reduction 3. Encourage improved lifestyle - low carb/cholesterol, reduce portion size, continue improving regular exercise      Type 2 diabetes, controlled, with neuropathy (HCC)    Stable DM A1c 7.0 at goal Complications - CKD-III, peripheral neuropathy, DM retinopathy OFF Trulicity due to side effect Very rare hypoglycemia  Plan:  1. Continue Lantus 27u daily, Novolog 12 BID wc Encourage improved lifestyle - low carb, low sugar diet, reduce portion size, continue improving  regular exercise Check CBG, bring log to next visit for review Continue ASA, ARB, Statin      Other Visit Diagnoses     Annual physical exam    -  Primary       Updated Health Maintenance information Reviewed recent lab results with patient Encouraged improvement to lifestyle with diet and exercise Goal of weight loss   Meds ordered this encounter  Medications   pantoprazole (PROTONIX) 40 MG tablet    Sig: Take 1 tablet (40 mg total) by mouth daily before breakfast.    Dispense:  90 tablet    Refill:  3    Dose increase     Follow up plan: Return in about 6 months (around 04/18/2022) for 6 month follow-up DM A1c, HTN.  AlexNobie Putnam [facility] 10/18/2021, 9:42 AM

## 2021-10-19 NOTE — Assessment & Plan Note (Signed)
Well-controlled HTN - Home BP readings controlled  Complication with CKD-III    Plan:  1. Continue Losartan ARB 12.'5mg'$  daily half of '25mg'$  2. Encourage improved lifestyle - low sodium diet, regular exercise 3. Continue monitor BP outside office, bring readings to next visit, if persistently >140/90 or new symptoms notify office sooner. Or if low BP < 100/70 and symptomatic

## 2021-10-19 NOTE — Assessment & Plan Note (Signed)
Continue Pantoprazole On this due to plavix

## 2021-10-19 NOTE — Assessment & Plan Note (Signed)
On Statin Calculated ASCVD 10 yr risk score elevated due to DM, age  Plan: 1. Continue current meds - Atorvastatin '40mg'$  daily 2. Continue ASA '81mg'$  for primary ASCVD risk reduction 3. Encourage improved lifestyle - low carb/cholesterol, reduce portion size, continue improving regular exercise

## 2021-10-19 NOTE — Assessment & Plan Note (Addendum)
Stable DM A1c 7.0 at goal Complications - CKD-III, peripheral neuropathy, DM retinopathy OFF Trulicity due to side effect Very rare hypoglycemia  Plan:  1. Continue Lantus 27u daily, Novolog 12 BID wc Encourage improved lifestyle - low carb, low sugar diet, reduce portion size, continue improving regular exercise Check CBG, bring log to next visit for review Continue ASA, ARB, Statin

## 2021-11-28 ENCOUNTER — Emergency Department: Payer: Medicare Other

## 2021-11-28 ENCOUNTER — Encounter: Payer: Self-pay | Admitting: Emergency Medicine

## 2021-11-28 ENCOUNTER — Other Ambulatory Visit: Payer: Self-pay

## 2021-11-28 ENCOUNTER — Emergency Department
Admission: EM | Admit: 2021-11-28 | Discharge: 2021-11-28 | Disposition: A | Discharge status: Home / Self Care | Payer: Medicare Other | Attending: Emergency Medicine | Admitting: Emergency Medicine

## 2021-11-28 DIAGNOSIS — E1122 Type 2 diabetes mellitus with diabetic chronic kidney disease: Secondary | ICD-10-CM | POA: Diagnosis not present

## 2021-11-28 DIAGNOSIS — N183 Chronic kidney disease, stage 3 unspecified: Secondary | ICD-10-CM | POA: Insufficient documentation

## 2021-11-28 DIAGNOSIS — I129 Hypertensive chronic kidney disease with stage 1 through stage 4 chronic kidney disease, or unspecified chronic kidney disease: Secondary | ICD-10-CM | POA: Insufficient documentation

## 2021-11-28 DIAGNOSIS — E1140 Type 2 diabetes mellitus with diabetic neuropathy, unspecified: Secondary | ICD-10-CM | POA: Insufficient documentation

## 2021-11-28 DIAGNOSIS — R0789 Other chest pain: Secondary | ICD-10-CM | POA: Insufficient documentation

## 2021-11-28 DIAGNOSIS — R079 Chest pain, unspecified: Secondary | ICD-10-CM | POA: Diagnosis not present

## 2021-11-28 HISTORY — DX: Cerebral infarction, unspecified: I63.9

## 2021-11-28 LAB — CBC
HCT: 38.7 % — ABNORMAL LOW (ref 39.0–52.0)
Hemoglobin: 12.7 g/dL — ABNORMAL LOW (ref 13.0–17.0)
MCH: 31.8 pg (ref 26.0–34.0)
MCHC: 32.8 g/dL (ref 30.0–36.0)
MCV: 96.8 fL (ref 80.0–100.0)
Platelets: 219 10*3/uL (ref 150–400)
RBC: 4 MIL/uL — ABNORMAL LOW (ref 4.22–5.81)
RDW: 12.4 % (ref 11.5–15.5)
WBC: 8.2 10*3/uL (ref 4.0–10.5)
nRBC: 0 % (ref 0.0–0.2)

## 2021-11-28 LAB — BASIC METABOLIC PANEL
Anion gap: 7 (ref 5–15)
BUN: 25 mg/dL — ABNORMAL HIGH (ref 8–23)
CO2: 23 mmol/L (ref 22–32)
Calcium: 9 mg/dL (ref 8.9–10.3)
Chloride: 111 mmol/L (ref 98–111)
Creatinine, Ser: 1.23 mg/dL (ref 0.61–1.24)
GFR, Estimated: 56 mL/min — ABNORMAL LOW (ref >60)
Glucose, Bld: 141 mg/dL — ABNORMAL HIGH (ref 70–99)
Potassium: 3.9 mmol/L (ref 3.5–5.1)
Sodium: 141 mmol/L (ref 135–145)

## 2021-11-28 LAB — TROPONIN I (HIGH SENSITIVITY)
Troponin I (High Sensitivity): 7 ng/L (ref <18)
Troponin I (High Sensitivity): 9 ng/L (ref <18)

## 2021-11-28 NOTE — ED Provider Notes (Signed)
[facility] Provider Note    Event Date/Time   First MD Initiated Contact with Patient 11/28/21 1314     (approximate)   History   Chest Pain   HPI  Charles York is a 88 y.o. male with a past medical history of TIA, hypertension, diabetes, hyperlipidemia, CKD who presents today for evaluation of chest pain.  Patient reports that he has had intermittent chest pain for the past month, but it became more pressure-like this morning.  He denies feeling short of breath or becoming diaphoretic or nauseous.  He reports that it mostly feels like a pressure and also tightness, but it has largely subsided.  He does not feel short of breath currently, and reports that he does not have pain anymore.  Patient reports that he gets most of his care through the [place], and recently had a Holter monitor.  He reports that he was told that he has "skipped beats."  He is scheduled to see a cardiologist this month.  He reports that he walks 2 miles every morning, and recently has been biking every day, and has no chest pain with this exertion.  Patient also notes that he has been very anxious recently and he is wondering if this is contributing to his symptoms.  Patient Active Problem List   Diagnosis Date Noted   TIA (transient ischemic attack) 07/11/2021   Hypertension    Overweight with body mass index (BMI) of 27 to 27.9 in adult 01/09/2021   Major depression, recurrent, full remission ([place]) 10/12/2019   GAD (generalized anxiety disorder) 10/12/2019   Chronic right shoulder pain 08/17/2018   Absolute anemia 07/30/2018   Primary osteoarthritis involving multiple joints 06/15/2018   GERD (gastroesophageal reflux disease) 11/18/2016   Diabetic retinopathy ([place]) 11/18/2016   Hyperlipidemia associated with type 2 diabetes mellitus ([place]) 11/18/2016   Daytime sleepiness 07/15/2016   Fatigue 07/15/2016   Presbycusis of both ears 07/15/2016   Tinnitus of right ear 07/15/2016   Allergic  rhinitis due to allergen 06/09/2016   CKD (chronic kidney disease), stage III ([place]) 02/27/2016   BPPV (benign paroxysmal positional vertigo), left 02/27/2016   Chronic pansinusitis 02/27/2016   Arthritis 07/03/2015   Colon polyp 07/03/2015   Benign hypertension with CKD (chronic kidney disease) stage III ([place]) 07/03/2015   High cholesterol 07/03/2015   Type 2 diabetes, controlled, with neuropathy ([place]) 07/03/2015          Physical Exam   Triage Vital Signs: ED Triage Vitals  Enc Vitals Group     BP 11/28/21 1308 (!) 169/67     Pulse Rate 11/28/21 1308 79     Resp 11/28/21 1308 18     Temp 11/28/21 1308 97.6 F (36.4 C)     Temp Source 11/28/21 1308 Oral     SpO2 11/28/21 1308 96 %     Weight 11/28/21 1307 185 lb (83.9 kg)     Height 11/28/21 1307 '5\' 8"'$  (1.727 m)     Head Circumference --      Peak Flow --      Pain Score 11/28/21 1307 5     Pain Loc --      Pain Edu? --      Excl. in [place]? --     Most recent vital signs: Vitals:   11/28/21 1500 11/28/21 1600  BP:  120/64  Pulse: 65 63  Resp: 20 (!) 21  Temp:    SpO2: 96% 96%    Physical  Exam Vitals and nursing note reviewed.  Constitutional:      General: Awake and alert. No acute distress.    Appearance: Normal appearance. The patient is normal weight.  HENT:     Head: Normocephalic and atraumatic.     Mouth: Mucous membranes are moist.  Eyes:     General: PERRL. Normal EOMs        Right eye: No discharge.        Left eye: No discharge.     Conjunctiva/sclera: Conjunctivae normal.  Cardiovascular:     Rate and Rhythm: Normal rate and regular rhythm.     Pulses: Normal pulses.  Equal in all 4 extremities    Heart sounds: Normal heart sounds Pulmonary:     Effort: Pulmonary effort is normal. No respiratory distress.     Breath sounds: Normal breath sounds.  Abdominal:     Abdomen is soft. There is no abdominal tenderness. No rebound or guarding. No distention. Musculoskeletal:        General: No  swelling. Normal range of motion.     Cervical back: Normal range of motion and neck supple.  Skin:    General: Skin is warm and dry.     Capillary Refill: Capillary refill takes less than 2 seconds.     Findings: No rash.  Neurological:     Mental Status: The patient is awake and alert.      ED Results / Procedures / Treatments   Labs (all labs ordered are listed, but only abnormal results are displayed) Labs Reviewed  BASIC METABOLIC PANEL - Abnormal; Notable for the following components:      Result Value   Glucose, Bld 141 (*)    BUN 25 (*)    GFR, Estimated 56 (*)    All other components within normal limits  CBC - Abnormal; Notable for the following components:   RBC 4.00 (*)    Hemoglobin 12.7 (*)    HCT 38.7 (*)    All other components within normal limits  TROPONIN I (HIGH SENSITIVITY)  TROPONIN I (HIGH SENSITIVITY)     EKG     RADIOLOGY I independently reviewed and interpreted imaging and agree with radiologists findings.     PROCEDURES:  Critical Care performed:   Procedures   MEDICATIONS ORDERED IN ED: Medications - No data to display   IMPRESSION / MDM / [place] / ED COURSE  I reviewed the triage vital signs and the nursing notes.   Differential diagnosis includes, but is not limited to, acute coronary syndrome, arrhythmia, electrolyte disarray.  Pulses equal in all 4 extremities, no chest pain currently, or back pain to suggest aortic dissection.  EKG obtained at triage demonstrates normal sinus rhythm with a first-degree AV block, no change from prior EKG from July 11, 2021.  Blood work including troponin x2 is normal.  Patient was reassessed multiple times and had no recurrence of his pain.  He reports that he currently feels his normal self.  I am reassured by the fact that he does not have any chest pain with exertion.  He has a scheduled follow-up appointment with the cardiologist and they recommended that he keep this  appointment.  We discussed return precautions in the meantime.  Patient understands and agrees with plan.  He was discharged with his daughter in stable condition.  Patient was discussed with Dr. Cinda Quest who agrees with assessment and plan.   Patient's presentation is most consistent with acute complicated illness /  injury requiring diagnostic workup.   Clinical Course as of 11/28/21 1732  Thu Nov 28, 2021  1415 Patient reevaluated, no pain currently and feels back to normal [JP]    Clinical Course User Index [JP] Rowland Ericsson, Clarnce Flock, PA-C     FINAL CLINICAL IMPRESSION(S) / ED DIAGNOSES   Final diagnoses:  Atypical chest pain     Rx / DC Orders   ED Discharge Orders     None        Note:  This document was prepared using Dragon voice recognition software and may include unintentional dictation errors.   Marquette Old, PA-C 11/28/21 1732    Nena Polio, MD 11/29/21 [phone]

## 2021-11-28 NOTE — Discharge Instructions (Signed)
Your EKG, chest x-ray, and blood tests are all normal.  Please follow-up with your cardiologist as you have scheduled.  Please return the emergency department immediately if you develop worsening chest pain, trouble breathing, vomiting or nausea, sweatiness, or any other new, worsening, or change in symptoms or other concerns.  It was a pleasure caring for you today.

## 2021-11-28 NOTE — ED Notes (Signed)
Pt comes in with complaints of chest pain the past couple of days. Patient states that he feels like a weight is on his chest, along with chest tightness. Pt also states that the pain comes and goes, and is sharp at time.

## 2021-11-28 NOTE — ED Triage Notes (Signed)
Patient to ED for left sided chest pain that started this AM. Patient states the pain has progressively gotten worse throughout the day. Hx of afib. Suppose to see cardiology next month. Patient also endorsing SOB.

## 2022-02-06 DIAGNOSIS — Z87891 Personal history of nicotine dependence: Secondary | ICD-10-CM | POA: Diagnosis not present

## 2022-02-06 DIAGNOSIS — E785 Hyperlipidemia, unspecified: Secondary | ICD-10-CM | POA: Diagnosis not present

## 2022-02-06 DIAGNOSIS — I700 Atherosclerosis of aorta: Secondary | ICD-10-CM | POA: Diagnosis not present

## 2022-02-06 DIAGNOSIS — Z79899 Other long term (current) drug therapy: Secondary | ICD-10-CM | POA: Diagnosis not present

## 2022-02-06 DIAGNOSIS — I440 Atrioventricular block, first degree: Secondary | ICD-10-CM | POA: Diagnosis not present

## 2022-02-06 DIAGNOSIS — I255 Ischemic cardiomyopathy: Secondary | ICD-10-CM | POA: Diagnosis not present

## 2022-02-06 DIAGNOSIS — R42 Dizziness and giddiness: Secondary | ICD-10-CM | POA: Diagnosis not present

## 2022-02-06 DIAGNOSIS — Z955 Presence of coronary angioplasty implant and graft: Secondary | ICD-10-CM | POA: Diagnosis not present

## 2022-02-06 DIAGNOSIS — Z9049 Acquired absence of other specified parts of digestive tract: Secondary | ICD-10-CM | POA: Diagnosis not present

## 2022-02-06 DIAGNOSIS — E1122 Type 2 diabetes mellitus with diabetic chronic kidney disease: Secondary | ICD-10-CM | POA: Diagnosis not present

## 2022-02-06 DIAGNOSIS — I5A Non-ischemic myocardial injury (non-traumatic): Secondary | ICD-10-CM | POA: Diagnosis not present

## 2022-02-06 DIAGNOSIS — I5022 Chronic systolic (congestive) heart failure: Secondary | ICD-10-CM | POA: Diagnosis not present

## 2022-02-06 DIAGNOSIS — R6889 Other general symptoms and signs: Secondary | ICD-10-CM | POA: Diagnosis not present

## 2022-02-06 DIAGNOSIS — I2510 Atherosclerotic heart disease of native coronary artery without angina pectoris: Secondary | ICD-10-CM | POA: Diagnosis not present

## 2022-02-06 DIAGNOSIS — R079 Chest pain, unspecified: Secondary | ICD-10-CM | POA: Diagnosis not present

## 2022-02-06 DIAGNOSIS — R404 Transient alteration of awareness: Secondary | ICD-10-CM | POA: Diagnosis not present

## 2022-02-06 DIAGNOSIS — K219 Gastro-esophageal reflux disease without esophagitis: Secondary | ICD-10-CM | POA: Diagnosis not present

## 2022-02-06 DIAGNOSIS — Z743 Need for continuous supervision: Secondary | ICD-10-CM | POA: Diagnosis not present

## 2022-02-06 DIAGNOSIS — I214 Non-ST elevation (NSTEMI) myocardial infarction: Secondary | ICD-10-CM | POA: Diagnosis not present

## 2022-02-06 DIAGNOSIS — Z886 Allergy status to analgesic agent status: Secondary | ICD-10-CM | POA: Diagnosis not present

## 2022-02-06 DIAGNOSIS — E782 Mixed hyperlipidemia: Secondary | ICD-10-CM | POA: Diagnosis not present

## 2022-02-06 DIAGNOSIS — Z8673 Personal history of transient ischemic attack (TIA), and cerebral infarction without residual deficits: Secondary | ICD-10-CM | POA: Diagnosis not present

## 2022-02-06 DIAGNOSIS — Z880 Allergy status to penicillin: Secondary | ICD-10-CM | POA: Diagnosis not present

## 2022-02-06 DIAGNOSIS — M79603 Pain in arm, unspecified: Secondary | ICD-10-CM | POA: Diagnosis not present

## 2022-02-06 DIAGNOSIS — I10 Essential (primary) hypertension: Secondary | ICD-10-CM | POA: Diagnosis not present

## 2022-02-06 DIAGNOSIS — H04129 Dry eye syndrome of unspecified lacrimal gland: Secondary | ICD-10-CM | POA: Diagnosis not present

## 2022-02-06 DIAGNOSIS — R7989 Other specified abnormal findings of blood chemistry: Secondary | ICD-10-CM | POA: Diagnosis not present

## 2022-02-06 DIAGNOSIS — Z7902 Long term (current) use of antithrombotics/antiplatelets: Secondary | ICD-10-CM | POA: Diagnosis not present

## 2022-02-06 DIAGNOSIS — R0789 Other chest pain: Secondary | ICD-10-CM | POA: Diagnosis not present

## 2022-02-06 DIAGNOSIS — N1832 Chronic kidney disease, stage 3b: Secondary | ICD-10-CM | POA: Diagnosis not present

## 2022-02-06 DIAGNOSIS — N183 Chronic kidney disease, stage 3 unspecified: Secondary | ICD-10-CM | POA: Diagnosis not present

## 2022-02-06 DIAGNOSIS — Z7982 Long term (current) use of aspirin: Secondary | ICD-10-CM | POA: Diagnosis not present

## 2022-02-06 DIAGNOSIS — I130 Hypertensive heart and chronic kidney disease with heart failure and stage 1 through stage 4 chronic kidney disease, or unspecified chronic kidney disease: Secondary | ICD-10-CM | POA: Diagnosis not present

## 2022-02-06 DIAGNOSIS — Z66 Do not resuscitate: Secondary | ICD-10-CM | POA: Diagnosis not present

## 2022-02-06 DIAGNOSIS — I5021 Acute systolic (congestive) heart failure: Secondary | ICD-10-CM | POA: Diagnosis not present

## 2022-02-06 DIAGNOSIS — Z794 Long term (current) use of insulin: Secondary | ICD-10-CM | POA: Diagnosis not present

## 2022-02-10 ENCOUNTER — Telehealth: Payer: Self-pay | Admitting: *Deleted

## 2022-02-10 NOTE — Patient Outreach (Signed)
  Care Coordination [facility] Note Transition Care Management Unsuccessful Follow-up Telephone Call  Date of discharge and from where:  [facility] [phone] NSTEMI  Attempts:  1st Attempt  Reason for unsuccessful TCM follow-up call:  No answer/busy  [place] Management [phone]

## 2022-02-12 ENCOUNTER — Ambulatory Visit (INDEPENDENT_AMBULATORY_CARE_PROVIDER_SITE_OTHER): Payer: 59 | Admitting: Family Medicine

## 2022-02-12 ENCOUNTER — Encounter: Payer: Self-pay | Admitting: Family Medicine

## 2022-02-12 VITALS — BP 130/68 | HR 78 | Ht 68.0 in | Wt 171.8 lb

## 2022-02-12 DIAGNOSIS — I252 Old myocardial infarction: Secondary | ICD-10-CM | POA: Diagnosis not present

## 2022-02-12 DIAGNOSIS — R1013 Epigastric pain: Secondary | ICD-10-CM | POA: Diagnosis not present

## 2022-02-12 DIAGNOSIS — K2100 Gastro-esophageal reflux disease with esophagitis, without bleeding: Secondary | ICD-10-CM | POA: Diagnosis not present

## 2022-02-12 MED ORDER — PANTOPRAZOLE SODIUM 40 MG PO TBEC: 40.0000 mg | DELAYED_RELEASE_TABLET | Freq: Two times a day (BID) | ORAL | 1 refills | Status: DC

## 2022-02-12 NOTE — Progress Notes (Signed)
Subjective:    Patient ID: Charles York, male    DOB: 12-27-1930, 89 y.o.   MRN: [id number]  Charles York is a 89 y.o. male presenting on 02/12/2022 for Hospitalization Follow-up   HPI  HOSPITAL FOLLOW-UP VISIT  Hospital/Location: [facility] Date of Admission: 02/06/22 Date of Discharge: 02/08/22 Transitions of care telephone call: Completed on 02/10/22 by Johny Shock RN  Reason for Admission: Chest Pain  [facility] H&P and Discharge Summary have been reviewed - Patient presents today 4 days after recent hospitalization. Brief summary of recent course, patient had symptoms of chest pain, hospitalized at [facility] and had Cardiac Cath, stent placement to treat NSTEMI, had issue with stent placement but resolved. Had ECHO and further.  Advised by [facility] Cardiology to pursue EGD endoscopy due to concern for GERD gastric ulcer Next. Charles York is not on NSAID  Charles York takes Tylenol, Sucralfate  His Pantoprazole '40mg'$  was increase from daily now to TWICE A DAY dosing.  Charles York currently has some epigastric lower sternal pain worse if leaning forward, and at night sometimes, worse with meals after eating.  - Today reports overall has done well after discharge. Symptoms of chest pain have resolved   - New medications on discharge: None - Changes to current meds on discharge: pantoprazole inc from 40 daily to 40 BID  I have reviewed the discharge medication list, and have reconciled the current and discharge medications today.   Current Outpatient Medications:    atorvastatin (LIPITOR) 40 MG tablet, Take 1 tablet (40 mg total) by mouth at bedtime., Disp: 90 tablet, Rfl: 3   Cholecalciferol 25 MCG (1000 UT) tablet, TAKE TWO TABLETS BY MOUTH EVERY DAY AS NEEDED, Disp: , Rfl:    clopidogrel (PLAVIX) 75 MG tablet, Take 1 tablet (75 mg total) by mouth daily., Disp: 90 tablet, Rfl: 3   ferrous sulfate 325 (65 FE) MG tablet, Take 325 mg by mouth every other day., Disp: , Rfl:    fluticasone (FLONASE) 50 MCG/ACT nasal  spray, Place into both nostrils daily., Disp: , Rfl:    hydroxypropyl methylcellulose / hypromellose (ISOPTO TEARS / GONIOVISC) 2.5 % ophthalmic solution, Place 1 drop into both eyes 4 (four) times daily., Disp: , Rfl:    insulin aspart (NOVOLOG) 100 UNIT/ML injection, Inject 12 Units into the skin in the morning and at bedtime. Inject 12 units in the morning, Inject 12 units in the evening, TAKE BEFORE MEALS, Disp: 10 mL, Rfl: 11   LANTUS 100 UNIT/ML injection, INJECT 27 UNITS SUBCUTANEOUSLY BEFORE BREAKFAST (Patient taking differently: 28 Units in the morning.), Disp: 10 mL, Rfl: 3   losartan (COZAAR) 25 MG tablet, Take 12.5 mg by mouth daily., Disp: , Rfl:    senna (SENOKOT) 8.6 MG tablet, Take 1 tablet by mouth as needed for constipation., Disp: , Rfl:    sodium chloride (MURO 128) 5 % ophthalmic solution, INSTILL 1 DROP IN EACH EYE FOUR TIMES A DAY, Disp: , Rfl:    sucralfate (CARAFATE) 1 g tablet, Take 1 g by mouth 4 (four) times daily -  with meals and at bedtime., Disp: , Rfl:    pantoprazole (PROTONIX) 40 MG tablet, Take 1 tablet (40 mg total) by mouth 2 (two) times daily before a meal., Disp: 180 tablet, Rfl: 1  ------------------------------------------------------------------------- Social History   Tobacco Use   Smoking status: Former    Types: Cigarettes    Quit date: 02/04/1983    Years since quitting: 39.0   Smokeless tobacco: Former  Electronics engineer  Use   Vaping Use: Never used  Substance Use Topics   Alcohol use: No    Alcohol/week: 0.0 standard drinks of alcohol   Drug use: No    Review of Systems Per HPI unless specifically indicated above     Objective:    BP 130/68   Pulse 78   Ht '5\' 8"'$  (1.727 m)   Wt 171 lb 12.8 oz (77.9 kg)   SpO2 99%   BMI 26.12 kg/m   Wt Readings from Last 3 Encounters:  02/12/22 171 lb 12.8 oz (77.9 kg)  11/28/21 185 lb (83.9 kg)  10/18/21 180 lb 9.6 oz (81.9 kg)    Physical Exam Vitals and nursing note reviewed.  Constitutional:       General: Charles York is not in acute distress.    Appearance: Charles York is well-developed. Charles York is not diaphoretic.     Comments: Well-appearing, comfortable, cooperative  HENT:     Head: Normocephalic and atraumatic.  Eyes:     General:        Right eye: No discharge.        Left eye: No discharge.     Conjunctiva/sclera: Conjunctivae normal.  Neck:     Thyroid: No thyromegaly.  Cardiovascular:     Rate and Rhythm: Normal rate and regular rhythm.     Pulses: Normal pulses.     Heart sounds: Normal heart sounds. No murmur heard. Pulmonary:     Effort: Pulmonary effort is normal. No respiratory distress.     Breath sounds: Normal breath sounds. No wheezing or rales.  Abdominal:     Comments: Epigastric tenderness on exam  Musculoskeletal:        General: Normal range of motion.     Cervical back: Normal range of motion and neck supple.  Lymphadenopathy:     Cervical: No cervical adenopathy.  Skin:    General: Skin is warm and dry.     Findings: No erythema or rash.  Neurological:     Mental Status: Charles York is alert and oriented to person, place, and time. Mental status is at baseline.  Psychiatric:        Behavior: Behavior normal.     Comments: Well groomed, good eye contact, normal speech and thoughts       Results for orders placed or performed during the hospital encounter of 91/47/82  Basic metabolic panel  Result Value Ref Range   Sodium 141 135 - 145 mmol/L   Potassium 3.9 3.5 - 5.1 mmol/L   Chloride 111 98 - 111 mmol/L   CO2 23 22 - 32 mmol/L   Glucose, Bld 141 (H) 70 - 99 mg/dL   BUN 25 (H) 8 - 23 mg/dL   Creatinine, Ser 1.23 0.61 - 1.24 mg/dL   Calcium 9.0 8.9 - 10.3 mg/dL   GFR, Estimated 56 (L) >60 mL/min   Anion gap 7 5 - 15  CBC  Result Value Ref Range   WBC 8.2 4.0 - 10.5 K/uL   RBC 4.00 (L) 4.22 - 5.81 MIL/uL   Hemoglobin 12.7 (L) 13.0 - 17.0 g/dL   HCT 38.7 (L) 39.0 - 52.0 %   MCV 96.8 80.0 - 100.0 fL   MCH 31.8 26.0 - 34.0 pg   MCHC 32.8 30.0 - 36.0 g/dL   RDW  12.4 11.5 - 15.5 %   Platelets 219 150 - 400 K/uL   nRBC 0.0 0.0 - 0.2 %  Troponin I (High Sensitivity)  Result Value Ref Range  Troponin I (High Sensitivity) 7 <18 ng/L  Troponin I (High Sensitivity)  Result Value Ref Range   Troponin I (High Sensitivity) 9 <18 ng/L      Assessment & Plan:   Problem List Items Addressed This Visit     GERD (gastroesophageal reflux disease) - Primary   Relevant Medications   pantoprazole (PROTONIX) 40 MG tablet   Other Relevant Orders   Ambulatory referral to Gastroenterology   Other Visit Diagnoses     History of non-ST elevation myocardial infarction (NSTEMI)       Epigastric pain       Relevant Orders   Ambulatory referral to Gastroenterology       [facility], s/p STENT placement for NSTEMI No further cardiac concerns based on  his evaluation and ECHO and other testing.  Charles York has persistent epigastric symptoms - Cardiology advised GI consultation outpatient and consider Upper Endoscopy.  Agree with higher dose Pantoprazole to treat this. I have re ordered the Pantoprazole for '40mg'$  twice a day sent to pharmacy [facility].  Keep on Sucralfate as needed.  Avoid Ibuprofen Advil Aleve. Charles York was not taking it regularly. Charles York does take Tylenol  Referral to [facility] - at [facility] in [place] - they will evaluate Acid Reflux / Stomach Ulcer and consider Upper Endoscopy  [address], [place] [address] Hours: 8AM-5PM Phone: [phone]  Orders Placed This Encounter  Procedures   Ambulatory referral to Gastroenterology    Referral Priority:   Routine    Referral Type:   Consultation    Referral Reason:   Specialty Services Required    Number of Visits Requested:   1      Meds ordered this encounter  Medications   pantoprazole (PROTONIX) 40 MG tablet    Sig: Take 1 tablet (40 mg total) by mouth 2 (two) times daily before a meal.    Dispense:  180 tablet    Refill:  1    Dose  increase    Follow up plan: Return if symptoms worsen or fail to improve.   Nobie Putnam, [facility] 02/12/2022, 3:04 PM

## 2022-02-12 NOTE — Patient Instructions (Addendum)
Thank you for coming to the office today.  I have re ordered the Pantoprazole for '40mg'$  twice a day sent to pharmacy [facility].  Keep on Sucralfate as needed.  Avoid Ibuprofen Advil Aleve.  Referral to [facility] - at [facility] in [place] - they will evaluate Acid Reflux / Stomach Ulcer and consider Upper Endoscopy  [address] [place], [place] [address] Hours: 8AM-5PM Phone: [phone]  Please schedule a Follow-up Appointment to: Return if symptoms worsen or fail to improve.  If you have any other questions or concerns, please feel free to call the office or send a message through [facility]. You may also schedule an earlier appointment if necessary.  Additionally, you may be receiving a survey about your experience at our office within a few days to 1 week by e-mail or mail. We value your feedback.  Nobie Putnam, DO [place]

## 2022-03-10 ENCOUNTER — Other Ambulatory Visit: Payer: Self-pay

## 2022-03-10 ENCOUNTER — Encounter: Payer: No Typology Code available for payment source | Attending: Nurse Practitioner

## 2022-03-10 DIAGNOSIS — Z48812 Encounter for surgical aftercare following surgery on the circulatory system: Secondary | ICD-10-CM | POA: Insufficient documentation

## 2022-03-10 DIAGNOSIS — Z955 Presence of coronary angioplasty implant and graft: Secondary | ICD-10-CM | POA: Insufficient documentation

## 2022-03-10 NOTE — Progress Notes (Signed)
Virtual Visit completed. Patient informed on EP and RD appointment and 6 Minute walk test. Patient also informed of patient health questionnaires on My Chart. Patient Verbalizes understanding. Visit diagnosis can be found in [facility] 02/27/2022.

## 2022-03-11 VITALS — Ht 67.75 in | Wt 176.2 lb

## 2022-03-11 DIAGNOSIS — Z955 Presence of coronary angioplasty implant and graft: Secondary | ICD-10-CM

## 2022-03-11 DIAGNOSIS — Z48812 Encounter for surgical aftercare following surgery on the circulatory system: Secondary | ICD-10-CM | POA: Diagnosis not present

## 2022-03-11 NOTE — Patient Instructions (Addendum)
Patient Instructions  Patient Details  Name: Charles York MRN: [id number] Date of Birth: 23-Jul-1930 Referring Provider:  Inocencio Homes, [place]  Below are your personal goals for exercise, nutrition, and risk factors. Our goal is to help you stay on track towards obtaining and maintaining these goals. We will be discussing your progress on these goals with you throughout the program.  Initial Exercise Prescription:  Initial Exercise Prescription - 03/11/22 1400       Date of Initial Exercise RX and Referring Provider   Date 03/11/22    Referring Provider Inocencio Homes FNP      Oxygen   Maintain Oxygen Saturation 88% or higher      Recumbant Bike   Level 2    RPM 50    Watts 20    Minutes 15    METs 1.76      NuStep   Level 2    SPM 80    Minutes 15    METs 1.76      T5 Nustep   Level 1    SPM 80    Minutes 15    METs 1.76      Track   Laps 21    Minutes 15    METs 2.14      Prescription Details   Frequency (times per week) 2    Duration Progress to 30 minutes of continuous aerobic without signs/symptoms of physical distress      Intensity   THRR 40-80% of Max Heartrate 104-121    Ratings of Perceived Exertion 11-13    Perceived Dyspnea 0-4      Progression   Progression Continue to progress workloads to maintain intensity without signs/symptoms of physical distress.      Resistance Training   Training Prescription Yes    Weight 3 lb    Reps 10-15             Exercise Goals: Frequency: Be able to perform aerobic exercise two to three times per week in program working toward 2-5 days per week of home exercise.  Intensity: Work with a perceived exertion of 11 (fairly light) - 15 (hard) while following your exercise prescription.  We will make changes to your prescription with you as you progress through the program.   Duration: Be able to do 30 to 45 minutes of continuous aerobic exercise in addition to a 5 minute warm-up and a 5 minute  cool-down routine.   Nutrition Goals: Your personal nutrition goals will be established when you do your nutrition analysis with the dietician.  The following are general nutrition guidelines to follow: Cholesterol < '200mg'$ /day Sodium < '1500mg'$ /day Fiber: Men over 50 yrs - 30 grams per day  Personal Goals:  Personal Goals and Risk Factors at Admission - 03/11/22 1402       Core Components/Risk Factors/Patient Goals on Admission    Weight Management Yes    Intervention Weight Management: Develop a combined nutrition and exercise program designed to reach desired caloric intake, while maintaining appropriate intake of nutrient and fiber, sodium and fats, and appropriate energy expenditure required for the weight goal.;Weight Management: Provide education and appropriate resources to help participant work on and attain dietary goals.;Weight Management/Obesity: Establish reasonable short term and long term weight goals.    Admit Weight 176 lb 3.2 oz (79.9 kg)    Expected Outcomes Short Term: Continue to assess and modify interventions until short term weight is achieved;Long Term: Adherence to nutrition and physical activity/exercise program aimed  toward attainment of established weight goal;Weight Maintenance: Understanding of the daily nutrition guidelines, which includes 25-35% calories from fat, 7% or less cal from saturated fats, less than '200mg'$  cholesterol, less than 1.5gm of sodium, & 5 or more servings of fruits and vegetables daily;Understanding recommendations for meals to include 15-35% energy as protein, 25-35% energy from fat, 35-60% energy from carbohydrates, less than '200mg'$  of dietary cholesterol, 20-35 gm of total fiber daily;Understanding of distribution of calorie intake throughout the day with the consumption of 4-5 meals/snacks    Diabetes Yes    Intervention Provide education about signs/symptoms and action to take for hypo/hyperglycemia.;Provide education about proper nutrition,  including hydration, and aerobic/resistive exercise prescription along with prescribed medications to achieve blood glucose in normal ranges: Fasting glucose 65-99 mg/dL    Expected Outcomes Short Term: Participant verbalizes understanding of the signs/symptoms and immediate care of hyper/hypoglycemia, proper foot care and importance of medication, aerobic/resistive exercise and nutrition plan for blood glucose control.;Long Term: Attainment of HbA1C < 7%.    Hypertension Yes    Intervention Provide education on lifestyle modifcations including regular physical activity/exercise, weight management, moderate sodium restriction and increased consumption of fresh fruit, vegetables, and low fat dairy, alcohol moderation, and smoking cessation.;Monitor prescription use compliance.    Expected Outcomes Short Term: Continued assessment and intervention until BP is < 140/23m HG in hypertensive participants. < 130/813mHG in hypertensive participants with diabetes, heart failure or chronic kidney disease.;Long Term: Maintenance of blood pressure at goal levels.    Lipids Yes    Intervention Provide education and support for participant on nutrition & aerobic/resistive exercise along with prescribed medications to achieve LDL '70mg'$ , HDL >'40mg'$ .    Expected Outcomes Short Term: Participant states understanding of desired cholesterol values and is compliant with medications prescribed. Participant is following exercise prescription and nutrition guidelines.;Long Term: Cholesterol controlled with medications as prescribed, with individualized exercise RX and with personalized nutrition plan. Value goals: LDL < '70mg'$ , HDL > 40 mg.            Exercise Goals and Review:  Exercise Goals     Row Name 03/11/22 1400             Exercise Goals   Increase Physical Activity Yes       Intervention Develop an individualized exercise prescription for aerobic and resistive training based on initial evaluation findings,  risk stratification, comorbidities and participant's personal goals.;Provide advice, education, support and counseling about physical activity/exercise needs.       Expected Outcomes Short Term: Attend rehab on a regular basis to increase amount of physical activity.;Long Term: Exercising regularly at least 3-5 days a week.;Long Term: Add in home exercise to make exercise part of routine and to increase amount of physical activity.       Increase Strength and Stamina Yes       Intervention Provide advice, education, support and counseling about physical activity/exercise needs.;Develop an individualized exercise prescription for aerobic and resistive training based on initial evaluation findings, risk stratification, comorbidities and participant's personal goals.       Expected Outcomes Short Term: Increase workloads from initial exercise prescription for resistance, speed, and METs.;Short Term: Perform resistance training exercises routinely during rehab and add in resistance training at home;Long Term: Improve cardiorespiratory fitness, muscular endurance and strength as measured by increased METs and functional capacity (6MWT)       Able to understand and use rate of perceived exertion (RPE) scale Yes  Intervention Provide education and explanation on how to use RPE scale       Expected Outcomes Short Term: Able to use RPE daily in rehab to express subjective intensity level;Long Term:  Able to use RPE to guide intensity level when exercising independently       Able to understand and use Dyspnea scale Yes       Intervention Provide education and explanation on how to use Dyspnea scale       Expected Outcomes Short Term: Able to use Dyspnea scale daily in rehab to express subjective sense of shortness of breath during exertion;Long Term: Able to use Dyspnea scale to guide intensity level when exercising independently       Knowledge and understanding of Target Heart Rate Range (THRR) Yes        Intervention Provide education and explanation of THRR including how the numbers were predicted and where they are located for reference       Expected Outcomes Short Term: Able to state/look up THRR;Long Term: Able to use THRR to govern intensity when exercising independently;Short Term: Able to use daily as guideline for intensity in rehab       Able to check pulse independently Yes       Intervention Provide education and demonstration on how to check pulse in carotid and radial arteries.;Review the importance of being able to check your own pulse for safety during independent exercise       Expected Outcomes Short Term: Able to explain why pulse checking is important during independent exercise;Long Term: Able to check pulse independently and accurately       Understanding of Exercise Prescription Yes       Intervention Provide education, explanation, and written materials on patient's individual exercise prescription       Expected Outcomes Short Term: Able to explain program exercise prescription;Long Term: Able to explain home exercise prescription to exercise independently

## 2022-03-11 NOTE — Progress Notes (Signed)
Cardiac Individual Treatment Plan  Patient Details  Name: Charles York MRN: [id number] Date of Birth: 02/18/1930 Referring Provider:   Flowsheet Row Cardiac Rehab from 03/11/2022 in [facility] Cardiac and Pulmonary Rehab  Referring Provider Inocencio Homes FNP       Initial Encounter Date:  Flowsheet Row Cardiac Rehab from 03/11/2022 in [facility] Cardiac and Pulmonary Rehab  Date 03/11/22       Visit Diagnosis: Status post coronary artery stent placement  Patient's Home Medications on Admission:  Current Outpatient Medications:    acetaminophen (TYLENOL) 325 MG tablet, Take by mouth., Disp: , Rfl:    aspirin 81 MG chewable tablet, CHEW ONE TABLET BY MOUTH EVERY DAY FOR ACUTE SYNDROME OF THE HEART ASK YOUR DOCTOR HOW LONG YOU SHOULD TAKE THIS MEDICATION, Disp: , Rfl:    atorvastatin (LIPITOR) 40 MG tablet, Take 1 tablet (40 mg total) by mouth at bedtime., Disp: 90 tablet, Rfl: 3   atorvastatin (LIPITOR) 80 MG tablet, TAKE ONE-HALF TABLET BY MOUTH AT BEDTIME FOR CHOLESTEROL (Patient not taking: Reported on 03/10/2022), Disp: , Rfl:    Cholecalciferol 25 MCG (1000 UT) tablet, TAKE TWO TABLETS BY MOUTH EVERY DAY AS NEEDED, Disp: , Rfl:    clopidogrel (PLAVIX) 75 MG tablet, Take 1 tablet (75 mg total) by mouth daily., Disp: 90 tablet, Rfl: 3   clopidogrel (PLAVIX) 75 MG tablet, Take 1 tablet by mouth daily. (Patient not taking: Reported on 03/10/2022), Disp: , Rfl:    dextrose (GLUTOSE) 40 % GEL, TAKE 1 TUBE (15 GRAMS OF GLUCOSE) BY MOUTH EVERY 15 MINUTES AS NEEDED FOR LOW BLOOD SUGAR (LESS THAN 70) (Patient not taking: Reported on 03/10/2022), Disp: , Rfl:    empagliflozin (JARDIANCE) 25 MG TABS tablet, TAKE ONE-HALF TABLET BY MOUTH EVERY DAY FOR TYPE 2 DIABETES MELLITUS (Patient not taking: Reported on 03/10/2022), Disp: , Rfl:    ferrous sulfate 325 (65 FE) MG tablet, Take 325 mg by mouth every other day., Disp: , Rfl:    fluticasone (FLONASE) 50 MCG/ACT nasal spray, Place into both nostrils daily.  (Patient not taking: Reported on 03/10/2022), Disp: , Rfl:    hydroxypropyl methylcellulose / hypromellose (ISOPTO TEARS / GONIOVISC) 2.5 % ophthalmic solution, Place 1 drop into both eyes 4 (four) times daily., Disp: , Rfl:    insulin aspart (NOVOLOG) 100 UNIT/ML injection, Inject 12 Units into the skin in the morning and at bedtime. Inject 12 units in the morning, Inject 12 units in the evening, TAKE BEFORE MEALS, Disp: 10 mL, Rfl: 11   insulin glargine (LANTUS) 100 UNIT/ML injection, Inject into the skin., Disp: , Rfl:    insulin glargine-yfgn (SEMGLEE) 100 UNIT/ML injection, INJECT 28 UNITS UNDER SKIN EVERY MORNING FOR DIABETES DISCARD VIAL 28 DAYS AFTER OPENING. (Patient not taking: Reported on 03/10/2022), Disp: , Rfl:    Insulin Syringe-Needle U-100 (INSULIN SYRINGE .5CC/30GX5/16") 30G X 5/16" 0.5 ML MISC, USE SYRINGE FOR INJECTION AS DIRECTED (Patient not taking: Reported on 03/10/2022), Disp: , Rfl:    LANTUS 100 UNIT/ML injection, INJECT 27 UNITS SUBCUTANEOUSLY BEFORE BREAKFAST (Patient not taking: Reported on 03/10/2022), Disp: 10 mL, Rfl: 3   losartan (COZAAR) 25 MG tablet, Take 12.5 mg by mouth daily., Disp: , Rfl:    losartan (COZAAR) 50 MG tablet, Take 1 tablet by mouth daily. (Patient not taking: Reported on 03/10/2022), Disp: , Rfl:    metFORMIN (GLUCOPHAGE-XR) 500 MG 24 hr tablet, TAKE ONE TABLET BY MOUTH EVERY DAY WITH FOOD FOR DIABETES (Patient not taking: Reported on 03/10/2022),  Disp: , Rfl:    metoprolol succinate (TOPROL-XL) 25 MG 24 hr tablet, TAKE ONE-HALF TABLET BY MOUTH EVERY DAY FOR CHRONIC HEART FAILURE (Patient not taking: Reported on 03/10/2022), Disp: , Rfl:    nitroGLYCERIN (NITROSTAT) 0.4 MG SL tablet, Place under the tongue., Disp: , Rfl:    pantoprazole (PROTONIX) 40 MG tablet, Take 1 tablet (40 mg total) by mouth 2 (two) times daily before a meal., Disp: 180 tablet, Rfl: 1   pantoprazole (PROTONIX) 40 MG tablet, Take 1 tablet by mouth daily. (Patient not taking: Reported on  03/10/2022), Disp: , Rfl:    prednisoLONE acetate (PRED FORTE) 1 % ophthalmic suspension, INSTILL 2 DROPS IN [facility] EYE FOUR TIMES A DAY, Disp: , Rfl:    senna (SENOKOT) 8.6 MG tablet, Take 1 tablet by mouth as needed for constipation. (Patient not taking: Reported on 03/10/2022), Disp: , Rfl:    senna-docusate (SENOKOT-S) 8.6-50 MG tablet, Take by mouth. (Patient not taking: Reported on 03/10/2022), Disp: , Rfl:    sodium chloride (MURO 128) 5 % ophthalmic ointment, APPLY THIN RIBBON IN [facility] EYE AT BEDTIME FUCH'S ONCE AT NIGHT (Patient not taking: Reported on 03/10/2022), Disp: , Rfl:    sodium chloride (MURO 128) 5 % ophthalmic solution, INSTILL 1 DROP IN [facility] EYE FOUR TIMES A DAY (Patient not taking: Reported on 03/10/2022), Disp: , Rfl:    sodium chloride (MURO 128) 5 % ophthalmic solution, INSTILL 1 DROP IN [facility] EYE FOUR TIMES A DAY FUCH'S (Patient not taking: Reported on 03/10/2022), Disp: , Rfl:    sucralfate (CARAFATE) 1 g tablet, Take 1 g by mouth 4 (four) times daily -  with meals and at bedtime. (Patient not taking: Reported on 03/10/2022), Disp: , Rfl:   Past Medical History: Past Medical History:  Diagnosis Date   Anxiety    Arthritis    Cancer ([place])    skin   Chronic kidney disease    stage 3   Colon polyp    Diabetes mellitus without complication (HCC)    GERD (gastroesophageal reflux disease)    Heartburn    Hyperlipemia    Hypertension    Obesity    Stroke ([place])     Tobacco Use: Social History   Tobacco Use  Smoking Status Former   Packs/day: 0.25   Years: 30.00   Total pack years: 7.50   Types: Cigarettes   Quit date: 02/04/1983   Years since quitting: 39.1  Smokeless Tobacco Former    Labs: Review Flowsheet  More data exists      Latest Ref Rng & Units 10/12/2020 03/14/2021 07/11/2021 07/12/2021 10/11/2021  Labs for ITP Cardiac and Pulmonary Rehab  Cholestrol <200 mg/dL 127  - - 99  109   LDL (calc) mg/dL (calc) 59  - - 45  49   Direct LDL 0 - 99 mg/dL - - 60.9  - -  HDL-C >  OR = 40 mg/dL 46  - - 37  41   Trlycerides <150 mg/dL 134  - - 86  102   Hemoglobin A1c <5.7 % of total Hgb 7.2  7.5  7.3  - 7.0      Exercise Target Goals: Exercise Program Goal: Individual exercise prescription set using results from initial 6 min walk test and THRR while considering  patient's activity barriers and safety.   Exercise Prescription Goal: Initial exercise prescription builds to 30-45 minutes a day of aerobic activity, 2-3 days per week.  Home exercise guidelines will be given to  patient during program as part of exercise prescription that the participant will acknowledge.   Education: Aerobic Exercise: - Group verbal and visual presentation on the components of exercise prescription. Introduces F.I.T.T principle from ACSM for exercise prescriptions.  Reviews F.I.T.T. principles of aerobic exercise including progression. Written material given at graduation.   Education: Resistance Exercise: - Group verbal and visual presentation on the components of exercise prescription. Introduces F.I.T.T principle from ACSM for exercise prescriptions  Reviews F.I.T.T. principles of resistance exercise including progression. Written material given at graduation.    Education: Exercise & Equipment Safety: - Individual verbal instruction and demonstration of equipment use and safety with use of the equipment. Flowsheet Row Cardiac Rehab from 03/11/2022 in [facility] Cardiac and Pulmonary Rehab  Date 03/10/22  Educator [facility]  Instruction Review Code 1- Verbalizes Understanding       Education: Exercise Physiology & General Exercise Guidelines: - Group verbal and written instruction with models to review the exercise physiology of the cardiovascular system and associated critical values. Provides general exercise guidelines with specific guidelines to those with heart or lung disease.    Education: Flexibility, Balance, Mind/Body Relaxation: - Group verbal and visual presentation with  interactive activity on the components of exercise prescription. Introduces F.I.T.T principle from ACSM for exercise prescriptions. Reviews F.I.T.T. principles of flexibility and balance exercise training including progression. Also discusses the mind body connection.  Reviews various relaxation techniques to help reduce and manage stress (i.e. Deep breathing, progressive muscle relaxation, and visualization). Balance handout provided to take home. Written material given at graduation.   Activity Barriers & Risk Stratification:  Activity Barriers & Cardiac Risk Stratification - 03/11/22 1400       Activity Barriers & Cardiac Risk Stratification   Activity Barriers Arthritis   Shoulders and Neck   Cardiac Risk Stratification Moderate             6 Minute Walk:  6 Minute Walk     Row Name 03/11/22 1358         6 Minute Walk   Phase Initial     Distance 1045 feet     Walk Time 6 minutes     # of Rest Breaks 0     MPH 1.98     METS 1.76     RPE 13     Perceived Dyspnea  1     VO2 Peak 6.14     Symptoms Yes (comment)     Comments Slightly SOB     Resting HR 88 bpm     Resting BP 108/50     Resting Oxygen Saturation  97 %     Exercise Oxygen Saturation  during 6 min walk 96 %     Max Ex. HR 131 bpm     Max Ex. BP 136/48     2 Minute Post BP 112/48              Oxygen Initial Assessment:   Oxygen Re-Evaluation:   Oxygen Discharge (Final Oxygen Re-Evaluation):   Initial Exercise Prescription:  Initial Exercise Prescription - 03/11/22 1400       Date of Initial Exercise RX and Referring Provider   Date 03/11/22    Referring Provider Inocencio Homes FNP      Oxygen   Maintain Oxygen Saturation 88% or higher      Recumbant Bike   Level 2    RPM 50    Watts 20    Minutes 15    METs 1.76  NuStep   Level 2    SPM 80    Minutes 15    METs 1.76      T5 Nustep   Level 1    SPM 80    Minutes 15    METs 1.76      Track   Laps 21     Minutes 15    METs 2.14      Prescription Details   Frequency (times per week) 2    Duration Progress to 30 minutes of continuous aerobic without signs/symptoms of physical distress      Intensity   THRR 40-80% of Max Heartrate 104-121    Ratings of Perceived Exertion 11-13    Perceived Dyspnea 0-4      Progression   Progression Continue to progress workloads to maintain intensity without signs/symptoms of physical distress.      Resistance Training   Training Prescription Yes    Weight 3 lb    Reps 10-15             Perform Capillary Blood Glucose checks as needed.  Exercise Prescription Changes:   Exercise Prescription Changes     Row Name 03/11/22 1400             Response to Exercise   Blood Pressure (Admit) 108/50       Blood Pressure (Exercise) 136/48       Blood Pressure (Exit) 112/48       Heart Rate (Admit) 88 bpm       Heart Rate (Exercise) 131 bpm       Heart Rate (Exit) 98 bpm       Oxygen Saturation (Admit) 97 %       Oxygen Saturation (Exercise) 96 %       Oxygen Saturation (Exit) 97 %       Rating of Perceived Exertion (Exercise) 13       Perceived Dyspnea (Exercise) 1       Symptoms Slightly SOB       Comments 6MWT Results                Exercise Comments:   Exercise Goals and Review:   Exercise Goals     Row Name 03/11/22 1400             Exercise Goals   Increase Physical Activity Yes       Intervention Develop an individualized exercise prescription for aerobic and resistive training based on initial evaluation findings, risk stratification, comorbidities and participant's personal goals.;Provide advice, education, support and counseling about physical activity/exercise needs.       Expected Outcomes Short Term: Attend rehab on a regular basis to increase amount of physical activity.;Long Term: Exercising regularly at least 3-5 days a week.;Long Term: Add in home exercise to make exercise part of routine and to increase  amount of physical activity.       Increase Strength and Stamina Yes       Intervention Provide advice, education, support and counseling about physical activity/exercise needs.;Develop an individualized exercise prescription for aerobic and resistive training based on initial evaluation findings, risk stratification, comorbidities and participant's personal goals.       Expected Outcomes Short Term: Increase workloads from initial exercise prescription for resistance, speed, and METs.;Short Term: Perform resistance training exercises routinely during rehab and add in resistance training at home;Long Term: Improve cardiorespiratory fitness, muscular endurance and strength as measured by increased METs and functional capacity (6MWT)  Able to understand and use rate of perceived exertion (RPE) scale Yes       Intervention Provide education and explanation on how to use RPE scale       Expected Outcomes Short Term: Able to use RPE daily in rehab to express subjective intensity level;Long Term:  Able to use RPE to guide intensity level when exercising independently       Able to understand and use Dyspnea scale Yes       Intervention Provide education and explanation on how to use Dyspnea scale       Expected Outcomes Short Term: Able to use Dyspnea scale daily in rehab to express subjective sense of shortness of breath during exertion;Long Term: Able to use Dyspnea scale to guide intensity level when exercising independently       Knowledge and understanding of Target Heart Rate Range (THRR) Yes       Intervention Provide education and explanation of THRR including how the numbers were predicted and where they are located for reference       Expected Outcomes Short Term: Able to state/look up THRR;Long Term: Able to use THRR to govern intensity when exercising independently;Short Term: Able to use daily as guideline for intensity in rehab       Able to check pulse independently Yes       Intervention  Provide education and demonstration on how to check pulse in carotid and radial arteries.;Review the importance of being able to check your own pulse for safety during independent exercise       Expected Outcomes Short Term: Able to explain why pulse checking is important during independent exercise;Long Term: Able to check pulse independently and accurately       Understanding of Exercise Prescription Yes       Intervention Provide education, explanation, and written materials on patient's individual exercise prescription       Expected Outcomes Short Term: Able to explain program exercise prescription;Long Term: Able to explain home exercise prescription to exercise independently                Exercise Goals Re-Evaluation :   Discharge Exercise Prescription (Final Exercise Prescription Changes):  Exercise Prescription Changes - 03/11/22 1400       Response to Exercise   Blood Pressure (Admit) 108/50    Blood Pressure (Exercise) 136/48    Blood Pressure (Exit) 112/48    Heart Rate (Admit) 88 bpm    Heart Rate (Exercise) 131 bpm    Heart Rate (Exit) 98 bpm    Oxygen Saturation (Admit) 97 %    Oxygen Saturation (Exercise) 96 %    Oxygen Saturation (Exit) 97 %    Rating of Perceived Exertion (Exercise) 13    Perceived Dyspnea (Exercise) 1    Symptoms Slightly SOB    Comments 6MWT Results             Nutrition:  Target Goals: Understanding of nutrition guidelines, daily intake of sodium <1570m, cholesterol <2012m calories 30% from fat and 7% or less from saturated fats, daily to have 5 or more servings of fruits and vegetables.  Education: All About Nutrition: -Group instruction provided by verbal, written material, interactive activities, discussions, models, and posters to present general guidelines for heart healthy nutrition including fat, fiber, MyPlate, the role of sodium in heart healthy nutrition, utilization of the nutrition label, and utilization of this  knowledge for meal planning. Follow up email sent as well. Written material given at graduation.  Flowsheet Row Cardiac Rehab from 03/11/2022 in [facility] Cardiac and Pulmonary Rehab  Education need identified 03/11/22       Biometrics:  Pre Biometrics - 03/11/22 1401       Pre Biometrics   Height 5' 7.75" (1.721 m)    Weight 176 lb 3.2 oz (79.9 kg)    Waist Circumference 41 inches    Hip Circumference 41.5 inches    Waist to Hip Ratio 0.99 %    BMI (Calculated) 26.98    Single Leg Stand 1.38 seconds              Nutrition Therapy Plan and Nutrition Goals:  Nutrition Therapy & Goals - 03/11/22 1317       Intervention Plan   Intervention Prescribe, educate and counsel regarding individualized specific dietary modifications aiming towards targeted core components such as weight, hypertension, lipid management, diabetes, heart failure and other comorbidities.    Expected Outcomes Short Term Goal: Understand basic principles of dietary content, such as calories, fat, sodium, cholesterol and nutrients.;Short Term Goal: A plan has been developed with personal nutrition goals set during dietitian appointment.;Long Term Goal: Adherence to prescribed nutrition plan.             Nutrition Assessments:  MEDIFICTS Score Key: ?70 Need to make dietary changes  40-70 Heart Healthy Diet ? 40 Therapeutic Level Cholesterol Diet  Flowsheet Row Cardiac Rehab from 03/11/2022 in [facility] Cardiac and Pulmonary Rehab  Picture Your Plate Total Score on Admission 75      Picture Your Plate Scores: <79 Unhealthy dietary pattern with much room for improvement. 41-50 Dietary pattern unlikely to meet recommendations for good health and room for improvement. 51-60 More healthful dietary pattern, with some room for improvement.  >60 Healthy dietary pattern, although there may be some specific behaviors that could be improved.    Nutrition Goals Re-Evaluation:   Nutrition Goals Discharge (Final  Nutrition Goals Re-Evaluation):   Psychosocial: Target Goals: Acknowledge presence or absence of significant depression and/or stress, maximize coping skills, provide positive support system. Participant is able to verbalize types and ability to use techniques and skills needed for reducing stress and depression.   Education: Stress, Anxiety, and Depression - Group verbal and visual presentation to define topics covered.  Reviews how body is impacted by stress, anxiety, and depression.  Also discusses healthy ways to reduce stress and to treat/manage anxiety and depression.  Written material given at graduation.   Education: Sleep Hygiene -Provides group verbal and written instruction about how sleep can affect your health.  Define sleep hygiene, discuss sleep cycles and impact of sleep habits. Review good sleep hygiene tips.    Initial Review & Psychosocial Screening:  Initial Psych Review & Screening - 03/10/22 1024       Initial Review   Current issues with None Identified      Family Dynamics   Good Support System? Yes    Comments Ahnaf lives with his daughter and she is his support. He has a son die in 2019 and his daughters husband passed away a year ago. His daughter helps him at home and takes care of him at times.      Barriers   Psychosocial barriers to participate in program The patient should benefit from training in stress management and relaxation.;There are no identifiable barriers or psychosocial needs.      Screening Interventions   Interventions To provide support and resources with identified psychosocial needs;Encouraged to exercise;Provide feedback about the scores to  participant    Expected Outcomes Short Term goal: Utilizing psychosocial counselor, staff and physician to assist with identification of specific Stressors or current issues interfering with healing process. Setting desired goal for each stressor or current issue identified.;Long Term Goal: Stressors  or current issues are controlled or eliminated.;Short Term goal: Identification and review with participant of any Quality of Life or Depression concerns found by scoring the questionnaire.;Long Term goal: The participant improves quality of Life and PHQ9 Scores as seen by post scores and/or verbalization of changes             Quality of Life Scores:   Quality of Life - 03/11/22 1314       Quality of Life   Select Quality of Life      Quality of Life Scores   Health/Function Pre 26.54 %    Socioeconomic Pre 28.33 %    Psych/Spiritual Pre 25.07 %    Family Pre 28.5 %    GLOBAL Pre 26.81 %            Scores of 19 and below usually indicate a poorer quality of life in these areas.  A difference of  2-3 points is a clinically meaningful difference.  A difference of 2-3 points in the total score of the Quality of Life Index has been associated with significant improvement in overall quality of life, self-image, physical symptoms, and general health in studies assessing change in quality of life.  PHQ-9: Review Flowsheet  More data exists      03/11/2022 10/18/2021 09/16/2021 07/16/2021 03/20/2021  Depression screen PHQ 2/9  Decreased Interest 2 0 0 0 0  Down, Depressed, Hopeless 2 0 0 0 0  PHQ - 2 Score 4 0 0 0 0  Altered sleeping 3 0 - 0 0  Tired, decreased energy 2 0 - 0 0  Change in appetite 0 0 - 0 0  Feeling bad or failure about yourself  0 0 - 0 0  Trouble concentrating 0 0 - 0 0  Moving slowly or fidgety/restless 1 0 - 0 0  Suicidal thoughts 0 0 - 0 0  PHQ-9 Score 10 0 - 0 0  Difficult doing work/chores Somewhat difficult Not difficult at all - Not difficult at all Not difficult at all   Interpretation of Total Score  Total Score Depression Severity:  1-4 = Minimal depression, 5-9 = Mild depression, 10-14 = Moderate depression, 15-19 = Moderately severe depression, 20-27 = Severe depression   Psychosocial Evaluation and Intervention:  Psychosocial Evaluation -  03/10/22 1025       Psychosocial Evaluation & Interventions   Interventions Encouraged to exercise with the program and follow exercise prescription;Relaxation education;Stress management education    Comments Oliverio lives with his daughter and she is his support. He has a son die in 2019 and his daughters husband passed away a year ago. His daughter helps him at home and takes care of him at times.    Expected Outcomes Short: Start HeartTrack to help with mood. Long: Maintain a healthy mental state    Continue Psychosocial Services  Follow up required by staff             Psychosocial Re-Evaluation:   Psychosocial Discharge (Final Psychosocial Re-Evaluation):   Vocational Rehabilitation: Provide vocational rehab assistance to qualifying candidates.   Vocational Rehab Evaluation & Intervention:   Education: Education Goals: Education classes will be provided on a variety of topics geared toward better understanding of heart health and  risk factor modification. Participant will state understanding/return demonstration of topics presented as noted by education test scores.  Learning Barriers/Preferences:  Learning Barriers/Preferences - 03/10/22 1024       Learning Barriers/Preferences   Learning Barriers None    Learning Preferences None             General Cardiac Education Topics:  AED/CPR: - Group verbal and written instruction with the use of models to demonstrate the basic use of the AED with the basic ABC's of resuscitation.   Anatomy and Cardiac Procedures: - Group verbal and visual presentation and models provide information about basic cardiac anatomy and function. Reviews the testing methods done to diagnose heart disease and the outcomes of the test results. Describes the treatment choices: Medical Management, Angioplasty, or Coronary Bypass Surgery for treating various heart conditions including Myocardial Infarction, Angina, Valve Disease, and Cardiac  Arrhythmias.  Written material given at graduation. Flowsheet Row Cardiac Rehab from 03/11/2022 in [facility] Cardiac and Pulmonary Rehab  Education need identified 03/11/22       Medication Safety: - Group verbal and visual instruction to review commonly prescribed medications for heart and lung disease. Reviews the medication, class of the drug, and side effects. Includes the steps to properly store meds and maintain the prescription regimen.  Written material given at graduation.   Intimacy: - Group verbal instruction through game format to discuss how heart and lung disease can affect sexual intimacy. Written material given at graduation..   Know Your Numbers and Heart Failure: - Group verbal and visual instruction to discuss disease risk factors for cardiac and pulmonary disease and treatment options.  Reviews associated critical values for Overweight/Obesity, Hypertension, Cholesterol, and Diabetes.  Discusses basics of heart failure: signs/symptoms and treatments.  Introduces Heart Failure Zone chart for action plan for heart failure.  Written material given at graduation.   Infection Prevention: - Provides verbal and written material to individual with discussion of infection control including proper hand washing and proper equipment cleaning during exercise session. Flowsheet Row Cardiac Rehab from 03/11/2022 in [facility] Cardiac and Pulmonary Rehab  Date 03/10/22  Educator [facility]  Instruction Review Code 1- Verbalizes Understanding       Falls Prevention: - Provides verbal and written material to individual with discussion of falls prevention and safety. Flowsheet Row Cardiac Rehab from 03/11/2022 in [facility] Cardiac and Pulmonary Rehab  Date 03/10/22  Educator [facility]  Instruction Review Code 1- Verbalizes Understanding       Other: -Provides group and verbal instruction on various topics (see comments)   Knowledge Questionnaire Score:  Knowledge Questionnaire Score - 03/11/22 1138        Knowledge Questionnaire Score   Pre Score 21/26             Core Components/Risk Factors/Patient Goals at Admission:  Personal Goals and Risk Factors at Admission - 03/11/22 1402       Core Components/Risk Factors/Patient Goals on Admission    Weight Management Yes    Intervention Weight Management: Develop a combined nutrition and exercise program designed to reach desired caloric intake, while maintaining appropriate intake of nutrient and fiber, sodium and fats, and appropriate energy expenditure required for the weight goal.;Weight Management: Provide education and appropriate resources to help participant work on and attain dietary goals.;Weight Management/Obesity: Establish reasonable short term and long term weight goals.    Admit Weight 176 lb 3.2 oz (79.9 kg)    Expected Outcomes Short Term: Continue to assess and modify interventions until short  term weight is achieved;Long Term: Adherence to nutrition and physical activity/exercise program aimed toward attainment of established weight goal;Weight Maintenance: Understanding of the daily nutrition guidelines, which includes 25-35% calories from fat, 7% or less cal from saturated fats, less than '200mg'$  cholesterol, less than 1.5gm of sodium, & 5 or more servings of fruits and vegetables daily;Understanding recommendations for meals to include 15-35% energy as protein, 25-35% energy from fat, 35-60% energy from carbohydrates, less than '200mg'$  of dietary cholesterol, 20-35 gm of total fiber daily;Understanding of distribution of calorie intake throughout the day with the consumption of 4-5 meals/snacks    Diabetes Yes    Intervention Provide education about signs/symptoms and action to take for hypo/hyperglycemia.;Provide education about proper nutrition, including hydration, and aerobic/resistive exercise prescription along with prescribed medications to achieve blood glucose in normal ranges: Fasting glucose 65-99 mg/dL    Expected Outcomes  Short Term: Participant verbalizes understanding of the signs/symptoms and immediate care of hyper/hypoglycemia, proper foot care and importance of medication, aerobic/resistive exercise and nutrition plan for blood glucose control.;Long Term: Attainment of HbA1C < 7%.    Hypertension Yes    Intervention Provide education on lifestyle modifcations including regular physical activity/exercise, weight management, moderate sodium restriction and increased consumption of fresh fruit, vegetables, and low fat dairy, alcohol moderation, and smoking cessation.;Monitor prescription use compliance.    Expected Outcomes Short Term: Continued assessment and intervention until BP is < 140/70m HG in hypertensive participants. < 130/837mHG in hypertensive participants with diabetes, heart failure or chronic kidney disease.;Long Term: Maintenance of blood pressure at goal levels.    Lipids Yes    Intervention Provide education and support for participant on nutrition & aerobic/resistive exercise along with prescribed medications to achieve LDL '70mg'$ , HDL >'40mg'$ .    Expected Outcomes Short Term: Participant states understanding of desired cholesterol values and is compliant with medications prescribed. Participant is following exercise prescription and nutrition guidelines.;Long Term: Cholesterol controlled with medications as prescribed, with individualized exercise RX and with personalized nutrition plan. Value goals: LDL < '70mg'$ , HDL > 40 mg.             Education:Diabetes - Individual verbal and written instruction to review signs/symptoms of diabetes, desired ranges of glucose level fasting, after meals and with exercise. Acknowledge that pre and post exercise glucose checks will be done for 3 sessions at entry of program. [facility] [place]  Date 03/10/22  [facility] M SuarezInstruction Review Code 1- Verbalizes Understanding       Core  Components/Risk Factors/Patient Goals Review:    Core Components/Risk Factors/Patient Goals at Discharge (Final Review):    ITP Comments:  ITP Comments     [place] 03/10/22 1022 03/11/22 1135         ITP Comments Virtual Visit completed. Patient informed on EP and RD appointment and 6 Minute walk test. Patient also informed of patient health questionnaires on My Chart. Patient Verbalizes understanding. [facility] Completed 6MWT and gym orientation. Initial ITP created and sent for review to Dr. MaEmily FilbertMedical Director.               Comments: Initial ITP

## 2022-03-12 DIAGNOSIS — H6982 Other specified disorders of Eustachian tube, left ear: Secondary | ICD-10-CM | POA: Diagnosis not present

## 2022-03-13 DIAGNOSIS — Z955 Presence of coronary angioplasty implant and graft: Secondary | ICD-10-CM

## 2022-03-13 NOTE — Progress Notes (Signed)
Completed initial RD consultation ?

## 2022-03-18 ENCOUNTER — Ambulatory Visit: Payer: No Typology Code available for payment source

## 2022-03-20 ENCOUNTER — Ambulatory Visit: Payer: No Typology Code available for payment source | Admitting: *Deleted

## 2022-03-25 ENCOUNTER — Ambulatory Visit: Payer: No Typology Code available for payment source | Admitting: *Deleted

## 2022-03-26 ENCOUNTER — Telehealth: Payer: Self-pay

## 2022-03-26 ENCOUNTER — Encounter: Payer: Self-pay | Admitting: *Deleted

## 2022-03-26 DIAGNOSIS — Z955 Presence of coronary angioplasty implant and graft: Secondary | ICD-10-CM

## 2022-03-26 NOTE — Telephone Encounter (Signed)
Called patient to check in on cardiac rehab as he no-showed the first couple weeks. He stated he hadn't shown because it was too physically demanding for him to walk to our office between the parking lot and where we're located. I explained how he can use valet parking and also ask the volunteers to help bring him down in a wheelchair. Patient was relieved to hear and willing to come back. He stated he will start next Tuesday, 2/27. Told him to call us if anything changes.

## 2022-03-26 NOTE — Progress Notes (Signed)
Cardiac Individual Treatment Plan  Patient Details  Name: Charles York MRN: [id number] Date of Birth: [birth year] Referring Provider:   Flowsheet Row Cardiac Rehab from 03/11/2022 in [facility] Cardiac and Pulmonary Rehab  Referring Provider Inocencio Homes FNP       Initial Encounter Date:  Flowsheet Row Cardiac Rehab from 03/11/2022 in [facility] Cardiac and Pulmonary Rehab  Date 03/11/22       Visit Diagnosis: Status post coronary artery stent placement  Patient's Home Medications on Admission:  Current Outpatient Medications:    acetaminophen (TYLENOL) 325 MG tablet, Take by mouth., Disp: , Rfl:    aspirin 81 MG chewable tablet, CHEW ONE TABLET BY MOUTH EVERY DAY FOR ACUTE SYNDROME OF THE HEART ASK YOUR DOCTOR HOW LONG YOU SHOULD TAKE THIS MEDICATION, Disp: , Rfl:    atorvastatin (LIPITOR) 40 MG tablet, Take 1 tablet (40 mg total) by mouth at bedtime., Disp: 90 tablet, Rfl: 3   atorvastatin (LIPITOR) 80 MG tablet, TAKE ONE-HALF TABLET BY MOUTH AT BEDTIME FOR CHOLESTEROL (Patient not taking: Reported on 03/10/2022), Disp: , Rfl:    Cholecalciferol 25 MCG (1000 UT) tablet, TAKE TWO TABLETS BY MOUTH EVERY DAY AS NEEDED, Disp: , Rfl:    clopidogrel (PLAVIX) 75 MG tablet, Take 1 tablet (75 mg total) by mouth daily., Disp: 90 tablet, Rfl: 3   clopidogrel (PLAVIX) 75 MG tablet, Take 1 tablet by mouth daily. (Patient not taking: Reported on 03/10/2022), Disp: , Rfl:    dextrose (GLUTOSE) 40 % GEL, TAKE 1 TUBE (15 GRAMS OF GLUCOSE) BY MOUTH EVERY 15 MINUTES AS NEEDED FOR LOW BLOOD SUGAR (LESS THAN 70) (Patient not taking: Reported on 03/10/2022), Disp: , Rfl:    empagliflozin (JARDIANCE) 25 MG TABS tablet, TAKE ONE-HALF TABLET BY MOUTH EVERY DAY FOR TYPE 2 DIABETES MELLITUS (Patient not taking: Reported on 03/10/2022), Disp: , Rfl:    ferrous sulfate 325 (65 FE) MG tablet, Take 325 mg by mouth every other day., Disp: , Rfl:    fluticasone (FLONASE) 50 MCG/ACT nasal spray, Place into both nostrils daily.  (Patient not taking: Reported on 03/10/2022), Disp: , Rfl:    hydroxypropyl methylcellulose / hypromellose (ISOPTO TEARS / GONIOVISC) 2.5 % ophthalmic solution, Place 1 drop into both eyes 4 (four) times daily., Disp: , Rfl:    insulin aspart (NOVOLOG) 100 UNIT/ML injection, Inject 12 Units into the skin in the morning and at bedtime. Inject 12 units in the morning, Inject 12 units in the evening, TAKE BEFORE MEALS, Disp: 10 mL, Rfl: 11   insulin glargine (LANTUS) 100 UNIT/ML injection, Inject into the skin., Disp: , Rfl:    insulin glargine-yfgn (SEMGLEE) 100 UNIT/ML injection, INJECT 28 UNITS UNDER SKIN EVERY MORNING FOR DIABETES DISCARD VIAL 28 DAYS AFTER OPENING. (Patient not taking: Reported on 03/10/2022), Disp: , Rfl:    Insulin Syringe-Needle U-100 (INSULIN SYRINGE .5CC/30GX5/16") 30G X 5/16" 0.5 ML MISC, USE SYRINGE FOR INJECTION AS DIRECTED (Patient not taking: Reported on 03/10/2022), Disp: , Rfl:    LANTUS 100 UNIT/ML injection, INJECT 27 UNITS SUBCUTANEOUSLY BEFORE BREAKFAST (Patient not taking: Reported on 03/10/2022), Disp: 10 mL, Rfl: 3   losartan (COZAAR) 25 MG tablet, Take 12.5 mg by mouth daily., Disp: , Rfl:    losartan (COZAAR) 50 MG tablet, Take 1 tablet by mouth daily. (Patient not taking: Reported on 03/10/2022), Disp: , Rfl:    metFORMIN (GLUCOPHAGE-XR) 500 MG 24 hr tablet, TAKE ONE TABLET BY MOUTH EVERY DAY WITH FOOD FOR DIABETES (Patient not taking: Reported on 03/10/2022),  Disp: , Rfl:    metoprolol succinate (TOPROL-XL) 25 MG 24 hr tablet, TAKE ONE-HALF TABLET BY MOUTH EVERY DAY FOR CHRONIC HEART FAILURE (Patient not taking: Reported on 03/10/2022), Disp: , Rfl:    nitroGLYCERIN (NITROSTAT) 0.4 MG SL tablet, Place under the tongue., Disp: , Rfl:    pantoprazole (PROTONIX) 40 MG tablet, Take 1 tablet (40 mg total) by mouth 2 (two) times daily before a meal., Disp: 180 tablet, Rfl: 1   pantoprazole (PROTONIX) 40 MG tablet, Take 1 tablet by mouth daily. (Patient not taking: Reported on  03/10/2022), Disp: , Rfl:    prednisoLONE acetate (PRED FORTE) 1 % ophthalmic suspension, INSTILL 2 DROPS IN [facility] EYE FOUR TIMES A DAY, Disp: , Rfl:    senna (SENOKOT) 8.6 MG tablet, Take 1 tablet by mouth as needed for constipation. (Patient not taking: Reported on 03/10/2022), Disp: , Rfl:    senna-docusate (SENOKOT-S) 8.6-50 MG tablet, Take by mouth. (Patient not taking: Reported on 03/10/2022), Disp: , Rfl:    sodium chloride (MURO 128) 5 % ophthalmic ointment, APPLY THIN RIBBON IN [facility] EYE AT BEDTIME FUCH'S ONCE AT NIGHT (Patient not taking: Reported on 03/10/2022), Disp: , Rfl:    sodium chloride (MURO 128) 5 % ophthalmic solution, INSTILL 1 DROP IN [facility] EYE FOUR TIMES A DAY (Patient not taking: Reported on 03/10/2022), Disp: , Rfl:    sodium chloride (MURO 128) 5 % ophthalmic solution, INSTILL 1 DROP IN [facility] EYE FOUR TIMES A DAY FUCH'S (Patient not taking: Reported on 03/10/2022), Disp: , Rfl:    sucralfate (CARAFATE) 1 g tablet, Take 1 g by mouth 4 (four) times daily -  with meals and at bedtime. (Patient not taking: Reported on 03/10/2022), Disp: , Rfl:   Past Medical History: Past Medical History:  Diagnosis Date   Anxiety    Arthritis    Cancer ([place])    skin   Chronic kidney disease    stage 3   Colon polyp    Diabetes mellitus without complication (HCC)    GERD (gastroesophageal reflux disease)    Heartburn    Hyperlipemia    Hypertension    Obesity    Stroke ([place])     Tobacco Use: Social History   Tobacco Use  Smoking Status Former   Packs/day: 0.25   Years: 30.00   Total pack years: 7.50   Types: Cigarettes   Quit date: 02/04/1983   Years since quitting: 39.1  Smokeless Tobacco Former    Labs: Review Flowsheet  More data exists      Latest Ref Rng & Units 10/12/2020 03/14/2021 07/11/2021 07/12/2021 10/11/2021  Labs for ITP Cardiac and Pulmonary Rehab  Cholestrol <200 mg/dL 127  - - 99  109   LDL (calc) mg/dL (calc) 59  - - 45  49   Direct LDL 0 - 99 mg/dL - - 60.9  - -  HDL-C >  OR = 40 mg/dL 46  - - 37  41   Trlycerides <150 mg/dL 134  - - 86  102   Hemoglobin A1c <5.7 % of total Hgb 7.2  7.5  7.3  - 7.0      Exercise Target Goals: Exercise Program Goal: Individual exercise prescription set using results from initial 6 min walk test and THRR while considering  patient's activity barriers and safety.   Exercise Prescription Goal: Initial exercise prescription builds to 30-45 minutes a day of aerobic activity, 2-3 days per week.  Home exercise guidelines will be given to  patient during program as part of exercise prescription that the participant will acknowledge.   Education: Aerobic Exercise: - Group verbal and visual presentation on the components of exercise prescription. Introduces F.I.T.T principle from ACSM for exercise prescriptions.  Reviews F.I.T.T. principles of aerobic exercise including progression. Written material given at graduation.   Education: Resistance Exercise: - Group verbal and visual presentation on the components of exercise prescription. Introduces F.I.T.T principle from ACSM for exercise prescriptions  Reviews F.I.T.T. principles of resistance exercise including progression. Written material given at graduation.    Education: Exercise & Equipment Safety: - Individual verbal instruction and demonstration of equipment use and safety with use of the equipment. Flowsheet Row Cardiac Rehab from 03/11/2022 in [facility] Cardiac and Pulmonary Rehab  Date 03/10/22  Educator [facility]  Instruction Review Code 1- Verbalizes Understanding       Education: Exercise Physiology & General Exercise Guidelines: - Group verbal and written instruction with models to review the exercise physiology of the cardiovascular system and associated critical values. Provides general exercise guidelines with specific guidelines to those with heart or lung disease.    Education: Flexibility, Balance, Mind/Body Relaxation: - Group verbal and visual presentation with  interactive activity on the components of exercise prescription. Introduces F.I.T.T principle from ACSM for exercise prescriptions. Reviews F.I.T.T. principles of flexibility and balance exercise training including progression. Also discusses the mind body connection.  Reviews various relaxation techniques to help reduce and manage stress (i.e. Deep breathing, progressive muscle relaxation, and visualization). Balance handout provided to take home. Written material given at graduation.   Activity Barriers & Risk Stratification:  Activity Barriers & Cardiac Risk Stratification - 03/11/22 1400       Activity Barriers & Cardiac Risk Stratification   Activity Barriers Arthritis   Shoulders and Neck   Cardiac Risk Stratification Moderate             6 Minute Walk:  6 Minute Walk     Row Name 03/11/22 1358         6 Minute Walk   Phase Initial     Distance 1045 feet     Walk Time 6 minutes     # of Rest Breaks 0     MPH 1.98     METS 1.76     RPE 13     Perceived Dyspnea  1     VO2 Peak 6.14     Symptoms Yes (comment)     Comments Slightly SOB     Resting HR 88 bpm     Resting BP 108/50     Resting Oxygen Saturation  97 %     Exercise Oxygen Saturation  during 6 min walk 96 %     Max Ex. HR 131 bpm     Max Ex. BP 136/48     2 Minute Post BP 112/48              Oxygen Initial Assessment:   Oxygen Re-Evaluation:   Oxygen Discharge (Final Oxygen Re-Evaluation):   Initial Exercise Prescription:  Initial Exercise Prescription - 03/11/22 1400       Date of Initial Exercise RX and Referring Provider   Date 03/11/22    Referring Provider Inocencio Homes FNP      Oxygen   Maintain Oxygen Saturation 88% or higher      Recumbant Bike   Level 2    RPM 50    Watts 20    Minutes 15    METs 1.76  NuStep   Level 2    SPM 80    Minutes 15    METs 1.76      T5 Nustep   Level 1    SPM 80    Minutes 15    METs 1.76      Track   Laps 21     Minutes 15    METs 2.14      Prescription Details   Frequency (times per week) 2    Duration Progress to 30 minutes of continuous aerobic without signs/symptoms of physical distress      Intensity   THRR 40-80% of Max Heartrate 104-121    Ratings of Perceived Exertion 11-13    Perceived Dyspnea 0-4      Progression   Progression Continue to progress workloads to maintain intensity without signs/symptoms of physical distress.      Resistance Training   Training Prescription Yes    Weight 3 lb    Reps 10-15             Perform Capillary Blood Glucose checks as needed.  Exercise Prescription Changes:   Exercise Prescription Changes     Row Name 03/11/22 1400             Response to Exercise   Blood Pressure (Admit) 108/50       Blood Pressure (Exercise) 136/48       Blood Pressure (Exit) 112/48       Heart Rate (Admit) 88 bpm       Heart Rate (Exercise) 131 bpm       Heart Rate (Exit) 98 bpm       Oxygen Saturation (Admit) 97 %       Oxygen Saturation (Exercise) 96 %       Oxygen Saturation (Exit) 97 %       Rating of Perceived Exertion (Exercise) 13       Perceived Dyspnea (Exercise) 1       Symptoms Slightly SOB       Comments 6MWT Results                Exercise Comments:   Exercise Goals and Review:   Exercise Goals     Row Name 03/11/22 1400             Exercise Goals   Increase Physical Activity Yes       Intervention Develop an individualized exercise prescription for aerobic and resistive training based on initial evaluation findings, risk stratification, comorbidities and participant's personal goals.;Provide advice, education, support and counseling about physical activity/exercise needs.       Expected Outcomes Short Term: Attend rehab on a regular basis to increase amount of physical activity.;Long Term: Exercising regularly at least 3-5 days a week.;Long Term: Add in home exercise to make exercise part of routine and to increase  amount of physical activity.       Increase Strength and Stamina Yes       Intervention Provide advice, education, support and counseling about physical activity/exercise needs.;Develop an individualized exercise prescription for aerobic and resistive training based on initial evaluation findings, risk stratification, comorbidities and participant's personal goals.       Expected Outcomes Short Term: Increase workloads from initial exercise prescription for resistance, speed, and METs.;Short Term: Perform resistance training exercises routinely during rehab and add in resistance training at home;Long Term: Improve cardiorespiratory fitness, muscular endurance and strength as measured by increased METs and functional capacity (6MWT)  Able to understand and use rate of perceived exertion (RPE) scale Yes       Intervention Provide education and explanation on how to use RPE scale       Expected Outcomes Short Term: Able to use RPE daily in rehab to express subjective intensity level;Long Term:  Able to use RPE to guide intensity level when exercising independently       Able to understand and use Dyspnea scale Yes       Intervention Provide education and explanation on how to use Dyspnea scale       Expected Outcomes Short Term: Able to use Dyspnea scale daily in rehab to express subjective sense of shortness of breath during exertion;Long Term: Able to use Dyspnea scale to guide intensity level when exercising independently       Knowledge and understanding of Target Heart Rate Range (THRR) Yes       Intervention Provide education and explanation of THRR including how the numbers were predicted and where they are located for reference       Expected Outcomes Short Term: Able to state/look up THRR;Long Term: Able to use THRR to govern intensity when exercising independently;Short Term: Able to use daily as guideline for intensity in rehab       Able to check pulse independently Yes       Intervention  Provide education and demonstration on how to check pulse in carotid and radial arteries.;Review the importance of being able to check your own pulse for safety during independent exercise       Expected Outcomes Short Term: Able to explain why pulse checking is important during independent exercise;Long Term: Able to check pulse independently and accurately       Understanding of Exercise Prescription Yes       Intervention Provide education, explanation, and written materials on patient's individual exercise prescription       Expected Outcomes Short Term: Able to explain program exercise prescription;Long Term: Able to explain home exercise prescription to exercise independently                Exercise Goals Re-Evaluation :   Discharge Exercise Prescription (Final Exercise Prescription Changes):  Exercise Prescription Changes - 03/11/22 1400       Response to Exercise   Blood Pressure (Admit) 108/50    Blood Pressure (Exercise) 136/48    Blood Pressure (Exit) 112/48    Heart Rate (Admit) 88 bpm    Heart Rate (Exercise) 131 bpm    Heart Rate (Exit) 98 bpm    Oxygen Saturation (Admit) 97 %    Oxygen Saturation (Exercise) 96 %    Oxygen Saturation (Exit) 97 %    Rating of Perceived Exertion (Exercise) 13    Perceived Dyspnea (Exercise) 1    Symptoms Slightly SOB    Comments 6MWT Results             Nutrition:  Target Goals: Understanding of nutrition guidelines, daily intake of sodium <1570m, cholesterol <2012m calories 30% from fat and 7% or less from saturated fats, daily to have 5 or more servings of fruits and vegetables.  Education: All About Nutrition: -Group instruction provided by verbal, written material, interactive activities, discussions, models, and posters to present general guidelines for heart healthy nutrition including fat, fiber, MyPlate, the role of sodium in heart healthy nutrition, utilization of the nutrition label, and utilization of this  knowledge for meal planning. Follow up email sent as well. Written material given at graduation.  Flowsheet Row Cardiac Rehab from 03/11/2022 in [facility] Cardiac and Pulmonary Rehab  Education need identified 03/11/22       Biometrics:  Pre Biometrics - 03/11/22 1401       Pre Biometrics   Height 5' 7.75" (1.721 m)    Weight 176 lb 3.2 oz (79.9 kg)    Waist Circumference 41 inches    Hip Circumference 41.5 inches    Waist to Hip Ratio 0.99 %    BMI (Calculated) 26.98    Single Leg Stand 1.38 seconds              Nutrition Therapy Plan and Nutrition Goals:  Nutrition Therapy & Goals - 03/13/22 0931       Nutrition Therapy   Diet Heart healthy, low Na, T2DM MNT    Drug/Food Interactions Statins/Certain Fruits    Protein (specify units) 95g    Fiber 25 grams    Whole Grain Foods 3 servings    Saturated Fats 20 max. grams    Fruits and Vegetables 8 servings/day    Sodium 2 grams      Personal Nutrition Goals   Nutrition Goal ST: include a rainbow of fruits/vegetables every week LT: maintain A1C <7, include wider variety of fruits/vegetables    Comments 89 y.o. M admitted to cardiac rehab s/p coronary artery stent. PMHx includes T2DM, skin cancer, CKD stg 3, GERD, HLD, HTN, stroke, anxiety, arthritis. Relevant medications reviewed 03/13/22: atorvastatin, cholecalciferol, dextrose PRN, ferrous sulfate, insulin (novolog, lantus), pantoprazole, senna, sucralfate. Most recent labs reviewed 03/13/22; A1C 7.6 (02/06/22). B: hot cereal (cream of wheat or oatmeal with 2 % milk and a small amount of tub butter) or cold cereal like cheerios (2% milk) or 2 boiled eggs and poached eggs with toast or pancake with [place] sausage. he always pairs his breakfast with mixed fruit) L: does not eat lunch D: grilled chicken or [place], sometimes steak. He eats a lot of green beans, squash, potatoes, okra, greens such as collards (he does not like spinach), beets. He will only occasionally have spaghetti. He  reports using olive oil and will only sometimes use a small amount of salt. Drinks: water. Charles York reports using whole wheat bread. Discussed general heart healthy eating and T2DM MNT.      Intervention Plan   Intervention Prescribe, educate and counsel regarding individualized specific dietary modifications aiming towards targeted core components such as weight, hypertension, lipid management, diabetes, heart failure and other comorbidities.    Expected Outcomes Short Term Goal: Understand basic principles of dietary content, such as calories, fat, sodium, cholesterol and nutrients.;Short Term Goal: A plan has been developed with personal nutrition goals set during dietitian appointment.;Long Term Goal: Adherence to prescribed nutrition plan.             Nutrition Assessments:  MEDIFICTS Score Key: ?70 Need to make dietary changes  40-70 Heart Healthy Diet ? 40 Therapeutic Level Cholesterol Diet  Flowsheet Row Cardiac Rehab from 03/11/2022 in [facility] Cardiac and Pulmonary Rehab  Picture Your Plate Total Score on Admission 75      Picture Your Plate Scores: [id number] Unhealthy dietary pattern with much room for improvement. 41-50 Dietary pattern unlikely to meet recommendations for good health and room for improvement. 51-60 More healthful dietary pattern, with some room for improvement.  >60 Healthy dietary pattern, although there may be some specific behaviors that could be improved.    Nutrition Goals Re-Evaluation:   Nutrition Goals Discharge (Final Nutrition Goals Re-Evaluation):   Psychosocial: Target  Goals: Acknowledge presence or absence of significant depression and/or stress, maximize coping skills, provide positive support system. Participant is able to verbalize types and ability to use techniques and skills needed for reducing stress and depression.   Education: Stress, Anxiety, and Depression - Group verbal and visual presentation to define topics covered.  Reviews how body  is impacted by stress, anxiety, and depression.  Also discusses healthy ways to reduce stress and to treat/manage anxiety and depression.  Written material given at graduation.   Education: Sleep Hygiene -Provides group verbal and written instruction about how sleep can affect your health.  Define sleep hygiene, discuss sleep cycles and impact of sleep habits. Review good sleep hygiene tips.    Initial Review & Psychosocial Screening:  Initial Psych Review & Screening - 03/10/22 1024       Initial Review   Current issues with None Identified      Family Dynamics   Good Support System? Yes    Comments Charles York lives with his daughter and she is his support. He has a son die in 2019 and his daughters husband passed away a year ago. His daughter helps him at home and takes care of him at times.      Barriers   Psychosocial barriers to participate in program The patient should benefit from training in stress management and relaxation.;There are no identifiable barriers or psychosocial needs.      Screening Interventions   Interventions To provide support and resources with identified psychosocial needs;Encouraged to exercise;Provide feedback about the scores to participant    Expected Outcomes Short Term goal: Utilizing psychosocial counselor, staff and physician to assist with identification of specific Stressors or current issues interfering with healing process. Setting desired goal for each stressor or current issue identified.;Long Term Goal: Stressors or current issues are controlled or eliminated.;Short Term goal: Identification and review with participant of any Quality of Life or Depression concerns found by scoring the questionnaire.;Long Term goal: The participant improves quality of Life and PHQ9 Scores as seen by post scores and/or verbalization of changes             Quality of Life Scores:   Quality of Life - 03/11/22 1314       Quality of Life   Select Quality of Life       Quality of Life Scores   Health/Function Pre 26.54 %    Socioeconomic Pre 28.33 %    Psych/Spiritual Pre 25.07 %    Family Pre 28.5 %    GLOBAL Pre 26.81 %            Scores of 19 and below usually indicate a poorer quality of life in these areas.  A difference of  2-3 points is a clinically meaningful difference.  A difference of 2-3 points in the total score of the Quality of Life Index has been associated with significant improvement in overall quality of life, self-image, physical symptoms, and general health in studies assessing change in quality of life.  PHQ-9: Review Flowsheet  More data exists      03/11/2022 10/18/2021 09/16/2021 07/16/2021 03/20/2021  Depression screen PHQ 2/9  Decreased Interest 2 0 0 0 0  Down, Depressed, Hopeless 2 0 0 0 0  PHQ - 2 Score 4 0 0 0 0  Altered sleeping 3 0 - 0 0  Tired, decreased energy 2 0 - 0 0  Change in appetite 0 0 - 0 0  Feeling bad or failure about yourself  0 0 - 0 0  Trouble concentrating 0 0 - 0 0  Moving slowly or fidgety/restless 1 0 - 0 0  Suicidal thoughts 0 0 - 0 0  PHQ-9 Score 10 0 - 0 0  Difficult doing work/chores Somewhat difficult Not difficult at all - Not difficult at all Not difficult at all   Interpretation of Total Score  Total Score Depression Severity:  1-4 = Minimal depression, 5-9 = Mild depression, 10-14 = Moderate depression, 15-19 = Moderately severe depression, 20-27 = Severe depression   Psychosocial Evaluation and Intervention:  Psychosocial Evaluation - 03/10/22 1025       Psychosocial Evaluation & Interventions   Interventions Encouraged to exercise with the program and follow exercise prescription;Relaxation education;Stress management education    Comments Charles York lives with his daughter and she is his support. He has a son die in 2019 and his daughters husband passed away a year ago. His daughter helps him at home and takes care of him at times.    Expected Outcomes Short: Start HeartTrack to  help with mood. Long: Maintain a healthy mental state    Continue Psychosocial Services  Follow up required by staff             Psychosocial Re-Evaluation:   Psychosocial Discharge (Final Psychosocial Re-Evaluation):   Vocational Rehabilitation: Provide vocational rehab assistance to qualifying candidates.   Vocational Rehab Evaluation & Intervention:   Education: Education Goals: Education classes will be provided on a variety of topics geared toward better understanding of heart health and risk factor modification. Participant will state understanding/return demonstration of topics presented as noted by education test scores.  Learning Barriers/Preferences:  Learning Barriers/Preferences - 03/10/22 1024       Learning Barriers/Preferences   Learning Barriers None    Learning Preferences None             General Cardiac Education Topics:  AED/CPR: - Group verbal and written instruction with the use of models to demonstrate the basic use of the AED with the basic ABC's of resuscitation.   Anatomy and Cardiac Procedures: - Group verbal and visual presentation and models provide information about basic cardiac anatomy and function. Reviews the testing methods done to diagnose heart disease and the outcomes of the test results. Describes the treatment choices: Medical Management, Angioplasty, or Coronary Bypass Surgery for treating various heart conditions including Myocardial Infarction, Angina, Valve Disease, and Cardiac Arrhythmias.  Written material given at graduation. Flowsheet Row Cardiac Rehab from 03/11/2022 in [facility] Cardiac and Pulmonary Rehab  Education need identified 03/11/22       Medication Safety: - Group verbal and visual instruction to review commonly prescribed medications for heart and lung disease. Reviews the medication, class of the drug, and side effects. Includes the steps to properly store meds and maintain the prescription regimen.  Written  material given at graduation.   Intimacy: - Group verbal instruction through game format to discuss how heart and lung disease can affect sexual intimacy. Written material given at graduation..   Know Your Numbers and Heart Failure: - Group verbal and visual instruction to discuss disease risk factors for cardiac and pulmonary disease and treatment options.  Reviews associated critical values for Overweight/Obesity, Hypertension, Cholesterol, and Diabetes.  Discusses basics of heart failure: signs/symptoms and treatments.  Introduces Heart Failure Zone chart for action plan for heart failure.  Written material given at graduation.   Infection Prevention: - Provides verbal and written material to individual with discussion of infection control  including proper hand washing and proper equipment cleaning during exercise session. Flowsheet Row Cardiac Rehab from 03/11/2022 in [facility] Cardiac and Pulmonary Rehab  Date 03/10/22  Educator [facility]  Instruction Review Code 1- Verbalizes Understanding       Falls Prevention: - Provides verbal and written material to individual with discussion of falls prevention and safety. Flowsheet Row Cardiac Rehab from 03/11/2022 in [facility] Cardiac and Pulmonary Rehab  Date 03/10/22  Educator [facility]  Instruction Review Code 1- Verbalizes Understanding       Other: -Provides group and verbal instruction on various topics (see comments)   Knowledge Questionnaire Score:  Knowledge Questionnaire Score - 03/11/22 1138       Knowledge Questionnaire Score   Pre Score 21/26             Core Components/Risk Factors/Patient Goals at Admission:  Personal Goals and Risk Factors at Admission - 03/11/22 1402       Core Components/Risk Factors/Patient Goals on Admission    Weight Management Yes    Intervention Weight Management: Develop a combined nutrition and exercise program designed to reach desired caloric intake, while maintaining appropriate intake of nutrient  and fiber, sodium and fats, and appropriate energy expenditure required for the weight goal.;Weight Management: Provide education and appropriate resources to help participant work on and attain dietary goals.;Weight Management/Obesity: Establish reasonable short term and long term weight goals.    Admit Weight 176 lb 3.2 oz (79.9 kg)    Expected Outcomes Short Term: Continue to assess and modify interventions until short term weight is achieved;Long Term: Adherence to nutrition and physical activity/exercise program aimed toward attainment of established weight goal;Weight Maintenance: Understanding of the daily nutrition guidelines, which includes 25-35% calories from fat, 7% or less cal from saturated fats, less than 262m cholesterol, less than 1.5gm of sodium, & 5 or more servings of fruits and vegetables daily;Understanding recommendations for meals to include 15-35% energy as protein, 25-35% energy from fat, 35-60% energy from carbohydrates, less than 2027mof dietary cholesterol, 20-35 gm of total fiber daily;Understanding of distribution of calorie intake throughout the day with the consumption of 4-5 meals/snacks    Diabetes Yes    Intervention Provide education about signs/symptoms and action to take for hypo/hyperglycemia.;Provide education about proper nutrition, including hydration, and aerobic/resistive exercise prescription along with prescribed medications to achieve blood glucose in normal ranges: Fasting glucose 65-99 mg/dL    Expected Outcomes Short Term: Participant verbalizes understanding of the signs/symptoms and immediate care of hyper/hypoglycemia, proper foot care and importance of medication, aerobic/resistive exercise and nutrition plan for blood glucose control.;Long Term: Attainment of HbA1C < 7%.    Hypertension Yes    Intervention Provide education on lifestyle modifcations including regular physical activity/exercise, weight management, moderate sodium restriction and  increased consumption of fresh fruit, vegetables, and low fat dairy, alcohol moderation, and smoking cessation.;Monitor prescription use compliance.    Expected Outcomes Short Term: Continued assessment and intervention until BP is < 140/9030mG in hypertensive participants. < 130/73m25m in hypertensive participants with diabetes, heart failure or chronic kidney disease.;Long Term: Maintenance of blood pressure at goal levels.    Lipids Yes    Intervention Provide education and support for participant on nutrition & aerobic/resistive exercise along with prescribed medications to achieve LDL <70mg61mL >40mg.69mExpected Outcomes Short Term: Participant states understanding of desired cholesterol values and is compliant with medications prescribed. Participant is following exercise prescription and nutrition guidelines.;Long Term: Cholesterol controlled with medications as prescribed,  with individualized exercise RX and with personalized nutrition plan. Value goals: LDL < 58m, HDL > 40 mg.             Education:Diabetes - Individual verbal and written instruction to review signs/symptoms of diabetes, desired ranges of glucose level fasting, after meals and with exercise. Acknowledge that pre and post exercise glucose checks will be done for 3 sessions at entry of program. [facility]  Date 03/10/22  [facility] Review Code 1- Verbalizes Understanding       Core Components/Risk Factors/Patient Goals Review:    Core Components/Risk Factors/Patient Goals at Discharge (Final Review):    ITP Comments:  ITP Comments     Row Name 03/10/22 1022 03/11/22 1135 03/13/22 1013 03/26/22 1229     ITP Comments Virtual Visit completed. Patient informed on EP and RD appointment and 6 Minute walk test. Patient also informed of patient health questionnaires on My Chart. Patient Verbalizes understanding. [facility]. Completed 6MWT and gym orientation. Initial ITP created and sent for review to Dr. MEmily Filbert Medical Director. Completed initial RD consultation 30 day review completed. ITP sent to Dr. MEmily Filbert Medical Director of Cardiac Rehab. Continue with ITP unless changes are made by physician.             Comments: 30 day review

## 2022-03-27 ENCOUNTER — Ambulatory Visit: Payer: No Typology Code available for payment source

## 2022-03-31 ENCOUNTER — Telehealth: Payer: Self-pay

## 2022-03-31 DIAGNOSIS — Z955 Presence of coronary angioplasty implant and graft: Secondary | ICD-10-CM

## 2022-03-31 NOTE — Progress Notes (Signed)
Discharge Progress Report  Patient Details  Name: Charles York MRN: [id number] Date of Birth: Aug 04, 1930 Referring Provider:   Flowsheet Row Cardiac Rehab from 03/11/2022 in [facility] Cardiac and Pulmonary Rehab  Referring Provider Inocencio Homes FNP        Number of Visits: 1  Reason for Discharge:  Early Exit:  Personal- unable to complete due to eye surgery. Patient will request new referral when ready and cleared.  Smoking History:  Social History   Tobacco Use  Smoking Status Former   Packs/day: 0.25   Years: 30.00   Total pack years: 7.50   Types: Cigarettes   Quit date: 02/04/1983   Years since quitting: 39.1  Smokeless Tobacco Former     Functional Capacity:  [place] Name 03/11/22 1358         6 Minute Walk   Phase Initial     Distance 1045 feet     Walk Time 6 minutes     # of Rest Breaks 0     MPH 1.98     METS 1.76     RPE 13     Perceived Dyspnea  1     VO2 Peak 6.14     Symptoms Yes (comment)     Comments Slightly SOB     Resting HR 88 bpm     Resting BP 108/50     Resting Oxygen Saturation  97 %     Exercise Oxygen Saturation  during 6 min walk 96 %     Max Ex. HR 131 bpm     Max Ex. BP 136/48     2 Minute Post BP 112/48               Nutrition & Weight - Outcomes:  Pre Biometrics - 03/11/22 1401       Pre Biometrics   Height 5' 7.75" (1.721 m)    Weight 176 lb 3.2 oz (79.9 kg)    Waist Circumference 41 inches    Hip Circumference 41.5 inches    Waist to Hip Ratio 0.99 %    BMI (Calculated) 26.98    Single Leg Stand 1.38 seconds

## 2022-03-31 NOTE — Progress Notes (Signed)
Cardiac Individual Treatment Plan  Patient Details  Name: Charles York MRN: [id number] Date of Birth: 02/18/1930 Referring Provider:   Flowsheet Row Cardiac Rehab from 03/11/2022 in [facility] Cardiac and Pulmonary Rehab  Referring Provider Inocencio Homes FNP       Initial Encounter Date:  Flowsheet Row Cardiac Rehab from 03/11/2022 in [facility] Cardiac and Pulmonary Rehab  Date 03/11/22       Visit Diagnosis: Status post coronary artery stent placement  Patient's Home Medications on Admission:  Current Outpatient Medications:    acetaminophen (TYLENOL) 325 MG tablet, Take by mouth., Disp: , Rfl:    aspirin 81 MG chewable tablet, CHEW ONE TABLET BY MOUTH EVERY DAY FOR ACUTE SYNDROME OF THE HEART ASK YOUR DOCTOR HOW LONG YOU SHOULD TAKE THIS MEDICATION, Disp: , Rfl:    atorvastatin (LIPITOR) 40 MG tablet, Take 1 tablet (40 mg total) by mouth at bedtime., Disp: 90 tablet, Rfl: 3   atorvastatin (LIPITOR) 80 MG tablet, TAKE ONE-HALF TABLET BY MOUTH AT BEDTIME FOR CHOLESTEROL (Patient not taking: Reported on 03/10/2022), Disp: , Rfl:    Cholecalciferol 25 MCG (1000 UT) tablet, TAKE TWO TABLETS BY MOUTH EVERY DAY AS NEEDED, Disp: , Rfl:    clopidogrel (PLAVIX) 75 MG tablet, Take 1 tablet (75 mg total) by mouth daily., Disp: 90 tablet, Rfl: 3   clopidogrel (PLAVIX) 75 MG tablet, Take 1 tablet by mouth daily. (Patient not taking: Reported on 03/10/2022), Disp: , Rfl:    dextrose (GLUTOSE) 40 % GEL, TAKE 1 TUBE (15 GRAMS OF GLUCOSE) BY MOUTH EVERY 15 MINUTES AS NEEDED FOR LOW BLOOD SUGAR (LESS THAN 70) (Patient not taking: Reported on 03/10/2022), Disp: , Rfl:    empagliflozin (JARDIANCE) 25 MG TABS tablet, TAKE ONE-HALF TABLET BY MOUTH EVERY DAY FOR TYPE 2 DIABETES MELLITUS (Patient not taking: Reported on 03/10/2022), Disp: , Rfl:    ferrous sulfate 325 (65 FE) MG tablet, Take 325 mg by mouth every other day., Disp: , Rfl:    fluticasone (FLONASE) 50 MCG/ACT nasal spray, Place into both nostrils daily.  (Patient not taking: Reported on 03/10/2022), Disp: , Rfl:    hydroxypropyl methylcellulose / hypromellose (ISOPTO TEARS / GONIOVISC) 2.5 % ophthalmic solution, Place 1 drop into both eyes 4 (four) times daily., Disp: , Rfl:    insulin aspart (NOVOLOG) 100 UNIT/ML injection, Inject 12 Units into the skin in the morning and at bedtime. Inject 12 units in the morning, Inject 12 units in the evening, TAKE BEFORE MEALS, Disp: 10 mL, Rfl: 11   insulin glargine (LANTUS) 100 UNIT/ML injection, Inject into the skin., Disp: , Rfl:    insulin glargine-yfgn (SEMGLEE) 100 UNIT/ML injection, INJECT 28 UNITS UNDER SKIN EVERY MORNING FOR DIABETES DISCARD VIAL 28 DAYS AFTER OPENING. (Patient not taking: Reported on 03/10/2022), Disp: , Rfl:    Insulin Syringe-Needle U-100 (INSULIN SYRINGE .5CC/30GX5/16") 30G X 5/16" 0.5 ML MISC, USE SYRINGE FOR INJECTION AS DIRECTED (Patient not taking: Reported on 03/10/2022), Disp: , Rfl:    LANTUS 100 UNIT/ML injection, INJECT 27 UNITS SUBCUTANEOUSLY BEFORE BREAKFAST (Patient not taking: Reported on 03/10/2022), Disp: 10 mL, Rfl: 3   losartan (COZAAR) 25 MG tablet, Take 12.5 mg by mouth daily., Disp: , Rfl:    losartan (COZAAR) 50 MG tablet, Take 1 tablet by mouth daily. (Patient not taking: Reported on 03/10/2022), Disp: , Rfl:    metFORMIN (GLUCOPHAGE-XR) 500 MG 24 hr tablet, TAKE ONE TABLET BY MOUTH EVERY DAY WITH FOOD FOR DIABETES (Patient not taking: Reported on 03/10/2022),  Disp: , Rfl:    metoprolol succinate (TOPROL-XL) 25 MG 24 hr tablet, TAKE ONE-HALF TABLET BY MOUTH EVERY DAY FOR CHRONIC HEART FAILURE (Patient not taking: Reported on 03/10/2022), Disp: , Rfl:    nitroGLYCERIN (NITROSTAT) 0.4 MG SL tablet, Place under the tongue., Disp: , Rfl:    pantoprazole (PROTONIX) 40 MG tablet, Take 1 tablet (40 mg total) by mouth 2 (two) times daily before a meal., Disp: 180 tablet, Rfl: 1   pantoprazole (PROTONIX) 40 MG tablet, Take 1 tablet by mouth daily. (Patient not taking: Reported on  03/10/2022), Disp: , Rfl:    prednisoLONE acetate (PRED FORTE) 1 % ophthalmic suspension, INSTILL 2 DROPS IN [facility] EYE FOUR TIMES A DAY, Disp: , Rfl:    senna (SENOKOT) 8.6 MG tablet, Take 1 tablet by mouth as needed for constipation. (Patient not taking: Reported on 03/10/2022), Disp: , Rfl:    senna-docusate (SENOKOT-S) 8.6-50 MG tablet, Take by mouth. (Patient not taking: Reported on 03/10/2022), Disp: , Rfl:    sodium chloride (MURO 128) 5 % ophthalmic ointment, APPLY THIN RIBBON IN [facility] EYE AT BEDTIME FUCH'S ONCE AT NIGHT (Patient not taking: Reported on 03/10/2022), Disp: , Rfl:    sodium chloride (MURO 128) 5 % ophthalmic solution, INSTILL 1 DROP IN [facility] EYE FOUR TIMES A DAY (Patient not taking: Reported on 03/10/2022), Disp: , Rfl:    sodium chloride (MURO 128) 5 % ophthalmic solution, INSTILL 1 DROP IN [facility] EYE FOUR TIMES A DAY FUCH'S (Patient not taking: Reported on 03/10/2022), Disp: , Rfl:    sucralfate (CARAFATE) 1 g tablet, Take 1 g by mouth 4 (four) times daily -  with meals and at bedtime. (Patient not taking: Reported on 03/10/2022), Disp: , Rfl:   Past Medical History: Past Medical History:  Diagnosis Date   Anxiety    Arthritis    Cancer ([place])    skin   Chronic kidney disease    stage 3   Colon polyp    Diabetes mellitus without complication (HCC)    GERD (gastroesophageal reflux disease)    Heartburn    Hyperlipemia    Hypertension    Obesity    Stroke ([place])     Tobacco Use: Social History   Tobacco Use  Smoking Status Former   Packs/day: 0.25   Years: 30.00   Total pack years: 7.50   Types: Cigarettes   Quit date: 02/04/1983   Years since quitting: 39.1  Smokeless Tobacco Former    Labs: Review Flowsheet  More data exists      Latest Ref Rng & Units 10/12/2020 03/14/2021 07/11/2021 07/12/2021 10/11/2021  Labs for ITP Cardiac and Pulmonary Rehab  Cholestrol <200 mg/dL 127  - - 99  109   LDL (calc) mg/dL (calc) 59  - - 45  49   Direct LDL 0 - 99 mg/dL - - 60.9  - -  HDL-C >  OR = 40 mg/dL 46  - - 37  41   Trlycerides <150 mg/dL 134  - - 86  102   Hemoglobin A1c <5.7 % of total Hgb 7.2  7.5  7.3  - 7.0      Exercise Target Goals: Exercise Program Goal: Individual exercise prescription set using results from initial 6 min walk test and THRR while considering  patient's activity barriers and safety.   Exercise Prescription Goal: Initial exercise prescription builds to 30-45 minutes a day of aerobic activity, 2-3 days per week.  Home exercise guidelines will be given to  patient during program as part of exercise prescription that the participant will acknowledge.   Education: Aerobic Exercise: - Group verbal and visual presentation on the components of exercise prescription. Introduces F.I.T.T principle from ACSM for exercise prescriptions.  Reviews F.I.T.T. principles of aerobic exercise including progression. Written material given at graduation.   Education: Resistance Exercise: - Group verbal and visual presentation on the components of exercise prescription. Introduces F.I.T.T principle from ACSM for exercise prescriptions  Reviews F.I.T.T. principles of resistance exercise including progression. Written material given at graduation.    Education: Exercise & Equipment Safety: - Individual verbal instruction and demonstration of equipment use and safety with use of the equipment. Flowsheet Row Cardiac Rehab from 03/11/2022 in [facility] Cardiac and Pulmonary Rehab  Date 03/10/22  Educator [facility]  Instruction Review Code 1- Verbalizes Understanding       Education: Exercise Physiology & General Exercise Guidelines: - Group verbal and written instruction with models to review the exercise physiology of the cardiovascular system and associated critical values. Provides general exercise guidelines with specific guidelines to those with heart or lung disease.    Education: Flexibility, Balance, Mind/Body Relaxation: - Group verbal and visual presentation with  interactive activity on the components of exercise prescription. Introduces F.I.T.T principle from ACSM for exercise prescriptions. Reviews F.I.T.T. principles of flexibility and balance exercise training including progression. Also discusses the mind body connection.  Reviews various relaxation techniques to help reduce and manage stress (i.e. Deep breathing, progressive muscle relaxation, and visualization). Balance handout provided to take home. Written material given at graduation.   Activity Barriers & Risk Stratification:  Activity Barriers & Cardiac Risk Stratification - 03/11/22 1400       Activity Barriers & Cardiac Risk Stratification   Activity Barriers Arthritis   Shoulders and Neck   Cardiac Risk Stratification Moderate             6 Minute Walk:  6 Minute Walk     Row Name 03/11/22 1358         6 Minute Walk   Phase Initial     Distance 1045 feet     Walk Time 6 minutes     # of Rest Breaks 0     MPH 1.98     METS 1.76     RPE 13     Perceived Dyspnea  1     VO2 Peak 6.14     Symptoms Yes (comment)     Comments Slightly SOB     Resting HR 88 bpm     Resting BP 108/50     Resting Oxygen Saturation  97 %     Exercise Oxygen Saturation  during 6 min walk 96 %     Max Ex. HR 131 bpm     Max Ex. BP 136/48     2 Minute Post BP 112/48              Oxygen Initial Assessment:   Oxygen Re-Evaluation:   Oxygen Discharge (Final Oxygen Re-Evaluation):   Initial Exercise Prescription:  Initial Exercise Prescription - 03/11/22 1400       Date of Initial Exercise RX and Referring Provider   Date 03/11/22    Referring Provider Inocencio Homes FNP      Oxygen   Maintain Oxygen Saturation 88% or higher      Recumbant Bike   Level 2    RPM 50    Watts 20    Minutes 15    METs 1.76  NuStep   Level 2    SPM 80    Minutes 15    METs 1.76      T5 Nustep   Level 1    SPM 80    Minutes 15    METs 1.76      Track   Laps 21     Minutes 15    METs 2.14      Prescription Details   Frequency (times per week) 2    Duration Progress to 30 minutes of continuous aerobic without signs/symptoms of physical distress      Intensity   THRR 40-80% of Max Heartrate 104-121    Ratings of Perceived Exertion 11-13    Perceived Dyspnea 0-4      Progression   Progression Continue to progress workloads to maintain intensity without signs/symptoms of physical distress.      Resistance Training   Training Prescription Yes    Weight 3 lb    Reps 10-15             Perform Capillary Blood Glucose checks as needed.  Exercise Prescription Changes:   Exercise Prescription Changes     Row Name 03/11/22 1400             Response to Exercise   Blood Pressure (Admit) 108/50       Blood Pressure (Exercise) 136/48       Blood Pressure (Exit) 112/48       Heart Rate (Admit) 88 bpm       Heart Rate (Exercise) 131 bpm       Heart Rate (Exit) 98 bpm       Oxygen Saturation (Admit) 97 %       Oxygen Saturation (Exercise) 96 %       Oxygen Saturation (Exit) 97 %       Rating of Perceived Exertion (Exercise) 13       Perceived Dyspnea (Exercise) 1       Symptoms Slightly SOB       Comments 6MWT Results                Exercise Comments:   Exercise Goals and Review:   Exercise Goals     Row Name 03/11/22 1400             Exercise Goals   Increase Physical Activity Yes       Intervention Develop an individualized exercise prescription for aerobic and resistive training based on initial evaluation findings, risk stratification, comorbidities and participant's personal goals.;Provide advice, education, support and counseling about physical activity/exercise needs.       Expected Outcomes Short Term: Attend rehab on a regular basis to increase amount of physical activity.;Long Term: Exercising regularly at least 3-5 days a week.;Long Term: Add in home exercise to make exercise part of routine and to increase  amount of physical activity.       Increase Strength and Stamina Yes       Intervention Provide advice, education, support and counseling about physical activity/exercise needs.;Develop an individualized exercise prescription for aerobic and resistive training based on initial evaluation findings, risk stratification, comorbidities and participant's personal goals.       Expected Outcomes Short Term: Increase workloads from initial exercise prescription for resistance, speed, and METs.;Short Term: Perform resistance training exercises routinely during rehab and add in resistance training at home;Long Term: Improve cardiorespiratory fitness, muscular endurance and strength as measured by increased METs and functional capacity (6MWT)  Able to understand and use rate of perceived exertion (RPE) scale Yes       Intervention Provide education and explanation on how to use RPE scale       Expected Outcomes Short Term: Able to use RPE daily in rehab to express subjective intensity level;Long Term:  Able to use RPE to guide intensity level when exercising independently       Able to understand and use Dyspnea scale Yes       Intervention Provide education and explanation on how to use Dyspnea scale       Expected Outcomes Short Term: Able to use Dyspnea scale daily in rehab to express subjective sense of shortness of breath during exertion;Long Term: Able to use Dyspnea scale to guide intensity level when exercising independently       Knowledge and understanding of Target Heart Rate Range (THRR) Yes       Intervention Provide education and explanation of THRR including how the numbers were predicted and where they are located for reference       Expected Outcomes Short Term: Able to state/look up THRR;Long Term: Able to use THRR to govern intensity when exercising independently;Short Term: Able to use daily as guideline for intensity in rehab       Able to check pulse independently Yes       Intervention  Provide education and demonstration on how to check pulse in carotid and radial arteries.;Review the importance of being able to check your own pulse for safety during independent exercise       Expected Outcomes Short Term: Able to explain why pulse checking is important during independent exercise;Long Term: Able to check pulse independently and accurately       Understanding of Exercise Prescription Yes       Intervention Provide education, explanation, and written materials on patient's individual exercise prescription       Expected Outcomes Short Term: Able to explain program exercise prescription;Long Term: Able to explain home exercise prescription to exercise independently                Exercise Goals Re-Evaluation :  Exercise Goals Re-Evaluation     Bern Name 03/26/22 1246             Exercise Goal Re-Evaluation   Exercise Goals Review Increase Physical Activity;Increase Strength and Stamina;Understanding of Exercise Prescription       Comments Patient has not started his first full day of exercise yet. He will start next Tuesday, 2/27.                Discharge Exercise Prescription (Final Exercise Prescription Changes):  Exercise Prescription Changes - 03/11/22 1400       Response to Exercise   Blood Pressure (Admit) 108/50    Blood Pressure (Exercise) 136/48    Blood Pressure (Exit) 112/48    Heart Rate (Admit) 88 bpm    Heart Rate (Exercise) 131 bpm    Heart Rate (Exit) 98 bpm    Oxygen Saturation (Admit) 97 %    Oxygen Saturation (Exercise) 96 %    Oxygen Saturation (Exit) 97 %    Rating of Perceived Exertion (Exercise) 13    Perceived Dyspnea (Exercise) 1    Symptoms Slightly SOB    Comments 6MWT Results             Nutrition:  Target Goals: Understanding of nutrition guidelines, daily intake of sodium '1500mg'$ , cholesterol '200mg'$ , calories 30% from fat and 7% or  less from saturated fats, daily to have 5 or more servings of fruits and  vegetables.  Education: All About Nutrition: -Group instruction provided by verbal, written material, interactive activities, discussions, models, and posters to present general guidelines for heart healthy nutrition including fat, fiber, MyPlate, the role of sodium in heart healthy nutrition, utilization of the nutrition label, and utilization of this knowledge for meal planning. Follow up email sent as well. Written material given at graduation. Flowsheet Row Cardiac Rehab from 03/11/2022 in [facility] Cardiac and Pulmonary Rehab  Education need identified 03/11/22       Biometrics:  Pre Biometrics - 03/11/22 1401       Pre Biometrics   Height 5' 7.75" (1.721 m)    Weight 176 lb 3.2 oz (79.9 kg)    Waist Circumference 41 inches    Hip Circumference 41.5 inches    Waist to Hip Ratio 0.99 %    BMI (Calculated) 26.98    Single Leg Stand 1.38 seconds              Nutrition Therapy Plan and Nutrition Goals:  Nutrition Therapy & Goals - 03/13/22 0931       Nutrition Therapy   Diet Heart healthy, low Na, T2DM MNT    Drug/Food Interactions Statins/Certain Fruits    Protein (specify units) 95g    Fiber 25 grams    Whole Grain Foods 3 servings    Saturated Fats 20 max. grams    Fruits and Vegetables 8 servings/day    Sodium 2 grams      Personal Nutrition Goals   Nutrition Goal ST: include a rainbow of fruits/vegetables every week LT: maintain A1C <7, include wider variety of fruits/vegetables    Comments 89 y.o. M admitted to cardiac rehab s/p coronary artery stent. PMHx includes T2DM, skin cancer, CKD stg 3, GERD, HLD, HTN, stroke, anxiety, arthritis. Relevant medications reviewed 03/13/22: atorvastatin, cholecalciferol, dextrose PRN, ferrous sulfate, insulin (novolog, lantus), pantoprazole, senna, sucralfate. Most recent labs reviewed 03/13/22; A1C 7.6 (02/06/22). B: hot cereal (cream of wheat or oatmeal with 2 % milk and a small amount of tub butter) or cold cereal like cheerios (2% milk)  or 2 boiled eggs and poached eggs with toast or pancake with [place] sausage. he always pairs his breakfast with mixed fruit) L: does not eat lunch D: grilled chicken or [place], sometimes steak. He eats a lot of green beans, squash, potatoes, okra, greens such as collards (he does not like spinach), beets. He will only occasionally have spaghetti. He reports using olive oil and will only sometimes use a small amount of salt. Drinks: water. Jahden reports using whole wheat bread. Discussed general heart healthy eating and T2DM MNT.      Intervention Plan   Intervention Prescribe, educate and counsel regarding individualized specific dietary modifications aiming towards targeted core components such as weight, hypertension, lipid management, diabetes, heart failure and other comorbidities.    Expected Outcomes Short Term Goal: Understand basic principles of dietary content, such as calories, fat, sodium, cholesterol and nutrients.;Short Term Goal: A plan has been developed with personal nutrition goals set during dietitian appointment.;Long Term Goal: Adherence to prescribed nutrition plan.             Nutrition Assessments:  MEDIFICTS Score Key: ?70 Need to make dietary changes  40-70 Heart Healthy Diet ? 40 Therapeutic Level Cholesterol Diet  Flowsheet Row Cardiac Rehab from 03/11/2022 in [facility] Cardiac and Pulmonary Rehab  Picture Your Plate Total Score on Admission  75      Picture Your Plate Scores: [id number] Unhealthy dietary pattern with much room for improvement. 41-50 Dietary pattern unlikely to meet recommendations for good health and room for improvement. 51-60 More healthful dietary pattern, with some room for improvement.  >60 Healthy dietary pattern, although there may be some specific behaviors that could be improved.    Nutrition Goals Re-Evaluation:   Nutrition Goals Discharge (Final Nutrition Goals Re-Evaluation):   Psychosocial: Target Goals: Acknowledge presence or absence  of significant depression and/or stress, maximize coping skills, provide positive support system. Participant is able to verbalize types and ability to use techniques and skills needed for reducing stress and depression.   Education: Stress, Anxiety, and Depression - Group verbal and visual presentation to define topics covered.  Reviews how body is impacted by stress, anxiety, and depression.  Also discusses healthy ways to reduce stress and to treat/manage anxiety and depression.  Written material given at graduation.   Education: Sleep Hygiene -Provides group verbal and written instruction about how sleep can affect your health.  Define sleep hygiene, discuss sleep cycles and impact of sleep habits. Review good sleep hygiene tips.    Initial Review & Psychosocial Screening:  Initial Psych Review & Screening - 03/10/22 1024       Initial Review   Current issues with None Identified      Family Dynamics   Good Support System? Yes    Comments Sunao lives with his daughter and she is his support. He has a son die in 2019 and his daughters husband passed away a year ago. His daughter helps him at home and takes care of him at times.      Barriers   Psychosocial barriers to participate in program The patient should benefit from training in stress management and relaxation.;There are no identifiable barriers or psychosocial needs.      Screening Interventions   Interventions To provide support and resources with identified psychosocial needs;Encouraged to exercise;Provide feedback about the scores to participant    Expected Outcomes Short Term goal: Utilizing psychosocial counselor, staff and physician to assist with identification of specific Stressors or current issues interfering with healing process. Setting desired goal for each stressor or current issue identified.;Long Term Goal: Stressors or current issues are controlled or eliminated.;Short Term goal: Identification and review with  participant of any Quality of Life or Depression concerns found by scoring the questionnaire.;Long Term goal: The participant improves quality of Life and PHQ9 Scores as seen by post scores and/or verbalization of changes             Quality of Life Scores:   Quality of Life - 03/11/22 1314       Quality of Life   Select Quality of Life      Quality of Life Scores   Health/Function Pre 26.54 %    Socioeconomic Pre 28.33 %    Psych/Spiritual Pre 25.07 %    Family Pre 28.5 %    GLOBAL Pre 26.81 %            Scores of 19 and below usually indicate a poorer quality of life in these areas.  A difference of  2-3 points is a clinically meaningful difference.  A difference of 2-3 points in the total score of the Quality of Life Index has been associated with significant improvement in overall quality of life, self-image, physical symptoms, and general health in studies assessing change in quality of life.  PHQ-9: Review Flowsheet  More data exists      03/11/2022 10/18/2021 09/16/2021 07/16/2021 03/20/2021  Depression screen PHQ 2/9  Decreased Interest 2 0 0 0 0  Down, Depressed, Hopeless 2 0 0 0 0  PHQ - 2 Score 4 0 0 0 0  Altered sleeping 3 0 - 0 0  Tired, decreased energy 2 0 - 0 0  Change in appetite 0 0 - 0 0  Feeling bad or failure about yourself  0 0 - 0 0  Trouble concentrating 0 0 - 0 0  Moving slowly or fidgety/restless 1 0 - 0 0  Suicidal thoughts 0 0 - 0 0  PHQ-9 Score 10 0 - 0 0  Difficult doing work/chores Somewhat difficult Not difficult at all - Not difficult at all Not difficult at all   Interpretation of Total Score  Total Score Depression Severity:  1-4 = Minimal depression, 5-9 = Mild depression, 10-14 = Moderate depression, 15-19 = Moderately severe depression, 20-27 = Severe depression   Psychosocial Evaluation and Intervention:  Psychosocial Evaluation - 03/10/22 1025       Psychosocial Evaluation & Interventions   Interventions Encouraged to exercise  with the program and follow exercise prescription;Relaxation education;Stress management education    Comments Markie lives with his daughter and she is his support. He has a son die in 2019 and his daughters husband passed away a year ago. His daughter helps him at home and takes care of him at times.    Expected Outcomes Short: Start HeartTrack to help with mood. Long: Maintain a healthy mental state    Continue Psychosocial Services  Follow up required by staff             Psychosocial Re-Evaluation:   Psychosocial Discharge (Final Psychosocial Re-Evaluation):   Vocational Rehabilitation: Provide vocational rehab assistance to qualifying candidates.   Vocational Rehab Evaluation & Intervention:   Education: Education Goals: Education classes will be provided on a variety of topics geared toward better understanding of heart health and risk factor modification. Participant will state understanding/return demonstration of topics presented as noted by education test scores.  Learning Barriers/Preferences:  Learning Barriers/Preferences - 03/10/22 1024       Learning Barriers/Preferences   Learning Barriers None    Learning Preferences None             General Cardiac Education Topics:  AED/CPR: - Group verbal and written instruction with the use of models to demonstrate the basic use of the AED with the basic ABC's of resuscitation.   Anatomy and Cardiac Procedures: - Group verbal and visual presentation and models provide information about basic cardiac anatomy and function. Reviews the testing methods done to diagnose heart disease and the outcomes of the test results. Describes the treatment choices: Medical Management, Angioplasty, or Coronary Bypass Surgery for treating various heart conditions including Myocardial Infarction, Angina, Valve Disease, and Cardiac Arrhythmias.  Written material given at graduation. Flowsheet Row Cardiac Rehab from 03/11/2022 in [facility]  Cardiac and Pulmonary Rehab  Education need identified 03/11/22       Medication Safety: - Group verbal and visual instruction to review commonly prescribed medications for heart and lung disease. Reviews the medication, class of the drug, and side effects. Includes the steps to properly store meds and maintain the prescription regimen.  Written material given at graduation.   Intimacy: - Group verbal instruction through game format to discuss how heart and lung disease can affect sexual intimacy. Written material given at graduation.Marland Kitchen   Know  Your Numbers and Heart Failure: - Group verbal and visual instruction to discuss disease risk factors for cardiac and pulmonary disease and treatment options.  Reviews associated critical values for Overweight/Obesity, Hypertension, Cholesterol, and Diabetes.  Discusses basics of heart failure: signs/symptoms and treatments.  Introduces Heart Failure Zone chart for action plan for heart failure.  Written material given at graduation.   Infection Prevention: - Provides verbal and written material to individual with discussion of infection control including proper hand washing and proper equipment cleaning during exercise session. Flowsheet Row Cardiac Rehab from 03/11/2022 in [facility] Cardiac and Pulmonary Rehab  Date 03/10/22  Educator [facility]  Instruction Review Code 1- Verbalizes Understanding       Falls Prevention: - Provides verbal and written material to individual with discussion of falls prevention and safety. Flowsheet Row Cardiac Rehab from 03/11/2022 in [facility] Cardiac and Pulmonary Rehab  Date 03/10/22  Educator [facility]  Instruction Review Code 1- Verbalizes Understanding       Other: -Provides group and verbal instruction on various topics (see comments)   Knowledge Questionnaire Score:  Knowledge Questionnaire Score - 03/11/22 1138       Knowledge Questionnaire Score   Pre Score 21/26             Core Components/Risk  Factors/Patient Goals at Admission:  Personal Goals and Risk Factors at Admission - 03/11/22 1402       Core Components/Risk Factors/Patient Goals on Admission    Weight Management Yes    Intervention Weight Management: Develop a combined nutrition and exercise program designed to reach desired caloric intake, while maintaining appropriate intake of nutrient and fiber, sodium and fats, and appropriate energy expenditure required for the weight goal.;Weight Management: Provide education and appropriate resources to help participant work on and attain dietary goals.;Weight Management/Obesity: Establish reasonable short term and long term weight goals.    Admit Weight 176 lb 3.2 oz (79.9 kg)    Expected Outcomes Short Term: Continue to assess and modify interventions until short term weight is achieved;Long Term: Adherence to nutrition and physical activity/exercise program aimed toward attainment of established weight goal;Weight Maintenance: Understanding of the daily nutrition guidelines, which includes 25-35% calories from fat, 7% or less cal from saturated fats, less than '200mg'$  cholesterol, less than 1.5gm of sodium, & 5 or more servings of fruits and vegetables daily;Understanding recommendations for meals to include 15-35% energy as protein, 25-35% energy from fat, 35-60% energy from carbohydrates, less than '200mg'$  of dietary cholesterol, 20-35 gm of total fiber daily;Understanding of distribution of calorie intake throughout the day with the consumption of 4-5 meals/snacks    Diabetes Yes    Intervention Provide education about signs/symptoms and action to take for hypo/hyperglycemia.;Provide education about proper nutrition, including hydration, and aerobic/resistive exercise prescription along with prescribed medications to achieve blood glucose in normal ranges: Fasting glucose 65-99 mg/dL    Expected Outcomes Short Term: Participant verbalizes understanding of the signs/symptoms and immediate care  of hyper/hypoglycemia, proper foot care and importance of medication, aerobic/resistive exercise and nutrition plan for blood glucose control.;Long Term: Attainment of HbA1C < 7%.    Hypertension Yes    Intervention Provide education on lifestyle modifcations including regular physical activity/exercise, weight management, moderate sodium restriction and increased consumption of fresh fruit, vegetables, and low fat dairy, alcohol moderation, and smoking cessation.;Monitor prescription use compliance.    Expected Outcomes Short Term: Continued assessment and intervention until BP is < 140/90m HG in hypertensive participants. < 130/842mHG in hypertensive participants with diabetes,  heart failure or chronic kidney disease.;Long Term: Maintenance of blood pressure at goal levels.    Lipids Yes    Intervention Provide education and support for participant on nutrition & aerobic/resistive exercise along with prescribed medications to achieve LDL '70mg'$ , HDL >'40mg'$ .    Expected Outcomes Short Term: Participant states understanding of desired cholesterol values and is compliant with medications prescribed. Participant is following exercise prescription and nutrition guidelines.;Long Term: Cholesterol controlled with medications as prescribed, with individualized exercise RX and with personalized nutrition plan. Value goals: LDL < '70mg'$ , HDL > 40 mg.             Education:Diabetes - Individual verbal and written instruction to review signs/symptoms of diabetes, desired ranges of glucose level fasting, after meals and with exercise. Acknowledge that pre and post exercise glucose checks will be done for 3 sessions at entry of program. [place] from 03/11/2022 in [facility] Cardiac and Pulmonary Rehab  Date 03/10/22  Educator [facility]  Instruction Review Code 1- Verbalizes Understanding       Core Components/Risk Factors/Patient Goals Review:    Core Components/Risk Factors/Patient Goals at  Discharge (Final Review):    ITP Comments:  ITP Comments     Row Name 03/10/22 1022 03/11/22 1135 03/13/22 1013 03/26/22 1229 03/31/22 1048   ITP Comments Virtual Visit completed. Patient informed on EP and RD appointment and 6 Minute walk test. Patient also informed of patient health questionnaires on My Chart. Patient Verbalizes understanding. Visit diagnosis can be found in [facility] 02/27/2022. Completed 6MWT and gym orientation. Initial ITP created and sent for review to Dr. Emily Filbert, Medical Director. Completed initial RD consultation 30 day review completed. ITP sent to Dr. Emily Filbert, Medical Director of Cardiac Rehab. Continue with ITP unless changes are made by physician. Patient called to let us know he is getting 2 eye surgeries in the next several months where his doctor advised him not to do any activity, per patient. Will discharge patient from cardiac rehab at this time as he stated he would be out for 2-3 months. Told patient to call back when he has completed his surgeries and request a new referral from his doctor when cleared. Patient verbalized understanding.            Comments: Discharge ITP

## 2022-03-31 NOTE — Telephone Encounter (Signed)
Patient called to let us know he is getting 2 eye surgeries in the next several months where his doctor advised him not to do any activity, per patient. Will discharge patient from cardiac rehab at this time as he stated he would be out for 2-3 months. Told patient to call back when he has completed his surgeries and request a new referral from his doctor when cleared. Patient verbalized understanding.

## 2022-04-01 ENCOUNTER — Ambulatory Visit: Payer: No Typology Code available for payment source

## 2022-04-03 ENCOUNTER — Ambulatory Visit: Payer: No Typology Code available for payment source

## 2022-04-08 ENCOUNTER — Ambulatory Visit: Payer: No Typology Code available for payment source

## 2022-04-10 ENCOUNTER — Ambulatory Visit: Payer: No Typology Code available for payment source

## 2022-04-14 DIAGNOSIS — R143 Flatulence: Secondary | ICD-10-CM | POA: Diagnosis not present

## 2022-04-14 DIAGNOSIS — R1013 Epigastric pain: Secondary | ICD-10-CM | POA: Diagnosis not present

## 2022-04-14 DIAGNOSIS — K219 Gastro-esophageal reflux disease without esophagitis: Secondary | ICD-10-CM | POA: Diagnosis not present

## 2022-04-14 DIAGNOSIS — R0789 Other chest pain: Secondary | ICD-10-CM | POA: Diagnosis not present

## 2022-04-15 ENCOUNTER — Ambulatory Visit: Payer: No Typology Code available for payment source

## 2022-04-17 ENCOUNTER — Ambulatory Visit: Payer: No Typology Code available for payment source

## 2022-04-18 ENCOUNTER — Ambulatory Visit: Payer: 59 | Admitting: Family Medicine

## 2022-04-22 ENCOUNTER — Ambulatory Visit: Payer: No Typology Code available for payment source

## 2022-04-22 ENCOUNTER — Ambulatory Visit: Payer: 59 | Admitting: Family Medicine

## 2022-04-24 ENCOUNTER — Ambulatory Visit: Payer: No Typology Code available for payment source

## 2022-04-29 ENCOUNTER — Ambulatory Visit: Payer: No Typology Code available for payment source

## 2022-04-30 ENCOUNTER — Ambulatory Visit
Admission: RE | Admit: 2022-04-30 | Discharge: 2022-04-30 | Disposition: A | Discharge status: Home / Self Care | Payer: 59 | Attending: Family Medicine | Admitting: Family Medicine

## 2022-04-30 ENCOUNTER — Encounter: Payer: Self-pay | Admitting: Family Medicine

## 2022-04-30 ENCOUNTER — Ambulatory Visit (INDEPENDENT_AMBULATORY_CARE_PROVIDER_SITE_OTHER): Payer: 59 | Admitting: Family Medicine

## 2022-04-30 ENCOUNTER — Ambulatory Visit
Admission: RE | Admit: 2022-04-30 | Discharge: 2022-04-30 | Disposition: A | Discharge status: Home / Self Care | Payer: 59 | Source: Ambulatory Visit | Attending: Family Medicine | Admitting: Family Medicine

## 2022-04-30 VITALS — BP 116/58 | HR 57 | Ht 67.5 in | Wt 172.6 lb

## 2022-04-30 DIAGNOSIS — R091 Pleurisy: Secondary | ICD-10-CM

## 2022-04-30 MED ORDER — PREDNISONE 50 MG PO TABS: 50.0000 mg | ORAL_TABLET | Freq: Every day | ORAL | 0 refills | Status: DC

## 2022-04-30 NOTE — Patient Instructions (Addendum)
Thank you for coming to the office today.  Pleurisy  Pleurisy is when the lining of your lungs (pleura) swells and gets irritated. This can cause pain in your chest, back, or shoulder. You may also have trouble breathing.  X-ray today to evaluate for any underlying cause of the pleurisy inflammation.  I will order Prednisone 50mg  daily for 5 days to help treat the pain and resolve the inflammation  After you finish the Prednisone you can take Aleve OTC 1-2 pills twice a day with meal to help reduce pain and inflammation  If you have any significant chest pain that does not go away within 30 minutes, is accompanied by nausea, sweating, shortness of breath, or made worse by activity, this may be evidence of a heart attack, especially if symptoms worsening instead of improving, please call 911 or go directly to the emergency room immediately for evaluation.   Please schedule a Follow-up Appointment to: Return if symptoms worsen or fail to improve.  If you have any other questions or concerns, please feel free to call the office or send a message through [place]. You may also schedule an earlier appointment if necessary.  Additionally, you may be receiving a survey about your experience at our office within a few days to 1 week by e-mail or mail. We value your feedback.  Nobie Putnam, DO [place]

## 2022-04-30 NOTE — Progress Notes (Signed)
Subjective:    Patient ID: Charles York, male    DOB: Jun 05, 1930, 89 y.o.   MRN: [id number]  Charles York is a 89 y.o. male presenting on 04/30/2022 for Pleurisy   HPI  Pleurisy / Pleuritic Pain Reports 3 weeks with LEFT sided chest wall pleuritic pain, not provoked to touch on outside No recent URI or cough or illness Reports at night he can have some tickle in throat that requires clearing. No lung problem or coughing He denies chest pain or pressure, worsening dyspnea, sweating nausea vomiting, fever chills Tried Tylenol some temporary relief for a few hours.      03/11/2022   11:39 AM 10/18/2021    9:46 AM 09/16/2021    1:19 PM  Depression screen PHQ 2/9  Decreased Interest 2 0 0  Down, Depressed, Hopeless 2 0 0  PHQ - 2 Score 4 0 0  Altered sleeping 3 0   Tired, decreased energy 2 0   Change in appetite 0 0   Feeling bad or failure about yourself  0 0   Trouble concentrating 0 0   Moving slowly or fidgety/restless 1 0   Suicidal thoughts 0 0   PHQ-9 Score 10 0   Difficult doing work/chores Somewhat difficult Not difficult at all     Social History   Tobacco Use   Smoking status: Former    Packs/day: 0.25    Years: 30.00    Additional pack years: 0.00    Total pack years: 7.50    Types: Cigarettes    Quit date: 02/04/1983    Years since quitting: 39.2   Smokeless tobacco: Former  Scientific laboratory technician Use: Never used  Substance Use Topics   Alcohol use: No    Alcohol/week: 0.0 standard drinks of alcohol   Drug use: No    Review of Systems Per HPI unless specifically indicated above     Objective:    BP (!) 116/58   Pulse (!) 57   Ht 5' 7.5" (1.715 m)   Wt 172 lb 9.6 oz (78.3 kg)   SpO2 98%   BMI 26.63 kg/m   Wt Readings from Last 3 Encounters:  04/30/22 172 lb 9.6 oz (78.3 kg)  03/11/22 176 lb 3.2 oz (79.9 kg)  02/12/22 171 lb 12.8 oz (77.9 kg)    Physical Exam Vitals and nursing note reviewed.  Constitutional:      General: He is not in  acute distress.    Appearance: He is well-developed. He is not diaphoretic.     Comments: Well-appearing, comfortable, cooperative  HENT:     Head: Normocephalic and atraumatic.  Eyes:     General:        Right eye: No discharge.        Left eye: No discharge.     Conjunctiva/sclera: Conjunctivae normal.  Neck:     Thyroid: No thyromegaly.  Cardiovascular:     Rate and Rhythm: Normal rate and regular rhythm.     Pulses: Normal pulses.     Heart sounds: Normal heart sounds. No murmur heard. Pulmonary:     Effort: Pulmonary effort is normal. No respiratory distress.     Breath sounds: Normal breath sounds. No wheezing or rales.  Chest:     Chest wall: No tenderness (Not tender to touch on left chest wall axillary region. this region is only provoked discomfort pain with position change and deep breathing).  Musculoskeletal:  General: Normal range of motion.     Cervical back: Normal range of motion and neck supple.  Lymphadenopathy:     Cervical: No cervical adenopathy.  Skin:    General: Skin is warm and dry.     Findings: No erythema or rash.  Neurological:     Mental Status: He is alert and oriented to person, place, and time. Mental status is at baseline.  Psychiatric:        Behavior: Behavior normal.     Comments: Well groomed, good eye contact, normal speech and thoughts     I have personally reviewed the radiology report from 04/30/22 on CXR.  CLINICAL DATA:  Left-sided pleurisy for 3 weeks.  No cough.   EXAM: CHEST - 2 VIEW   COMPARISON:  Radiographs 11/28/2021 and 09/15/2020.   FINDINGS: The heart size and mediastinal contours are stable. There is aortic atherosclerosis and a left-sided coronary artery stent. Chronic mild elevation of the left hemidiaphragm and left basilar scarring, similar to previous studies. The right lung is clear. There is no pleural effusion or pneumothorax. The bones appear unremarkable.   IMPRESSION: No acute cardiopulmonary  process. Chronic left basilar scarring and elevation of the left hemidiaphragm.     Electronically Signed   By: Richardean Sale M.D.   On: 04/30/2022 14:10  Results for orders placed or performed during the hospital encounter of [id number]  Basic metabolic panel  Result Value Ref Range   Sodium 141 135 - 145 mmol/L   Potassium 3.9 3.5 - 5.1 mmol/L   Chloride 111 98 - 111 mmol/L   CO2 23 22 - 32 mmol/L   Glucose, Bld 141 (H) 70 - 99 mg/dL   BUN 25 (H) 8 - 23 mg/dL   Creatinine, Ser 1.23 0.61 - 1.24 mg/dL   Calcium 9.0 8.9 - 10.3 mg/dL   GFR, Estimated 56 (L) >60 mL/min   Anion gap 7 5 - 15  CBC  Result Value Ref Range   WBC 8.2 4.0 - 10.5 K/uL   RBC 4.00 (L) 4.22 - 5.81 MIL/uL   Hemoglobin 12.7 (L) 13.0 - 17.0 g/dL   HCT 38.7 (L) 39.0 - 52.0 %   MCV 96.8 80.0 - 100.0 fL   MCH 31.8 26.0 - 34.0 pg   MCHC 32.8 30.0 - 36.0 g/dL   RDW 12.4 11.5 - 15.5 %   Platelets 219 150 - 400 K/uL   nRBC 0.0 0.0 - 0.2 %  Troponin I (High Sensitivity)  Result Value Ref Range   Troponin I (High Sensitivity) 7 <18 ng/L  Troponin I (High Sensitivity)  Result Value Ref Range   Troponin I (High Sensitivity) 9 <18 ng/L      Assessment & Plan:   Problem List Items Addressed This Visit   None Visit Diagnoses     Pleurisy    -  Primary   Relevant Medications   predniSONE (DELTASONE) 50 MG tablet   Other Relevant Orders   DG Chest 2 View       History and exam most suggestive of pleurisy, may be from coughing or URI infection in recent history or could be MSK etiology with positional strain  STAT X-ray today to evaluate for any underlying cause of the pleurisy inflammation or other issues.  I will order Prednisone 50mg  daily for 5 days to help treat the pain and resolve the inflammation  After you finish the Prednisone you can take Aleve OTC 1-2 pills twice a day with meal  to help reduce pain and inflammation  Take Tylenol AS NEEDED as is May use topical Voltaren, topical ice/heat as  needed  Return criteria given, when to seek care sooner or follow up if not improved  Update 5pm Called patient back with CXR results. No acute abnormality. No cause of his pain. Negative for effusion or pneumonia. There is chronic L hemidiaphragm elevation and L base scarring but no new acute changes these are chronic Reassurance Keep on treatment course Follow up if not improved w/ 1 week we can consider Chest CT if persistent concern.  Orders Placed This Encounter  Procedures   DG Chest 2 View    Standing Status:   Future    Number of Occurrences:   1    Standing Expiration Date:   04/30/2023    Order Specific Question:   Reason for Exam (SYMPTOM  OR DIAGNOSIS REQUIRED)    Answer:   left sided pleurisy 3 weeks, no cough    Order Specific Question:   Preferred imaging location?    Answer:   [facility]     Meds ordered this encounter  Medications   predniSONE (DELTASONE) 50 MG tablet    Sig: Take 1 tablet (50 mg total) by mouth daily with breakfast.    Dispense:  5 tablet    Refill:  0      Follow up plan: Return if symptoms worsen or fail to improve.   Nobie Putnam, [facility] 04/30/2022, 1:32 PM

## 2022-05-01 ENCOUNTER — Ambulatory Visit: Payer: No Typology Code available for payment source

## 2022-05-06 ENCOUNTER — Ambulatory Visit: Payer: No Typology Code available for payment source

## 2022-05-08 ENCOUNTER — Ambulatory Visit: Payer: No Typology Code available for payment source

## 2022-05-13 ENCOUNTER — Ambulatory Visit: Payer: No Typology Code available for payment source

## 2022-05-15 ENCOUNTER — Ambulatory Visit: Payer: No Typology Code available for payment source

## 2022-05-20 ENCOUNTER — Ambulatory Visit: Payer: No Typology Code available for payment source

## 2022-05-22 ENCOUNTER — Ambulatory Visit: Payer: No Typology Code available for payment source

## 2022-05-22 ENCOUNTER — Ambulatory Visit (INDEPENDENT_AMBULATORY_CARE_PROVIDER_SITE_OTHER): Payer: 59 | Admitting: Family Medicine

## 2022-05-22 ENCOUNTER — Encounter: Payer: Self-pay | Admitting: Family Medicine

## 2022-05-22 VITALS — BP 114/53 | HR 75 | Ht 67.5 in | Wt 169.0 lb

## 2022-05-22 DIAGNOSIS — K2100 Gastro-esophageal reflux disease with esophagitis, without bleeding: Secondary | ICD-10-CM | POA: Diagnosis not present

## 2022-05-22 NOTE — Patient Instructions (Addendum)
Thank you for coming to the office today.  Keep on Pantoprazole for stomach acid and carafate for stomach acid  We cannot continue Prednisone  Limit Aspirin.  Take Novolog insulin ONLY with meal. Skip in morning if not eating.  Okay to take long acting insulin if need.  Please schedule a Follow-up Appointment to: Return if symptoms worsen or fail to improve.  If you have any other questions or concerns, please feel free to call the office or send a message through MyChart. You may also schedule an earlier appointment if necessary.  Additionally, you may be receiving a survey about your experience at our office within a few days to 1 week by e-mail or mail. We value your feedback.  Saralyn Pilar, DO [facility] [place], [place]

## 2022-05-22 NOTE — Progress Notes (Signed)
Subjective:    Patient ID: RHYTHM WIGFALL, male    DOB: 03/04/1930, 89 y.o.   MRN: [id number]  Charles York is a 89 y.o. male presenting on 05/22/2022 for Gastroesophageal Reflux   HPI  GERD PUD with radiation of symptoms  Recent visit 3/27 thought to have more pleuritic pain based on history. Completed Prednisone  He was taking Aspirin higher dose recently regularly and believes it was flaring up his stomach ulcer  He has been taking Pantoprazole  twice a day with improvement. He has taken Carafate that has helped his symptoms as well. He has refills Eating drinking without difficulty now  Magnesium normal 3 months ago, he has magnesium supplement       03/11/2022   11:39 AM 10/18/2021    9:46 AM 09/16/2021    1:19 PM  Depression screen PHQ 2/9  Decreased Interest 2 0 0  Down, Depressed, Hopeless 2 0 0  PHQ - 2 Score 4 0 0  Altered sleeping 3 0   Tired, decreased energy 2 0   Change in appetite 0 0   Feeling bad or failure about yourself  0 0   Trouble concentrating 0 0   Moving slowly or fidgety/restless 1 0   Suicidal thoughts 0 0   PHQ-9 Score 10 0   Difficult doing work/chores Somewhat difficult Not difficult at all     Social History   Tobacco Use   Smoking status: Former    Packs/day: 0.25    Years: 30.00    Additional pack years: 0.00    Total pack years: 7.50    Types: Cigarettes    Quit date: 02/04/1983    Years since quitting: 39.3   Smokeless tobacco: Former  Building services engineer Use: Never used  Substance Use Topics   Alcohol use: No    Alcohol/week: 0.0 standard drinks of alcohol   Drug use: No    Review of Systems Per HPI unless specifically indicated above     Objective:    BP (!) 114/53   Pulse 75   Ht 5' 7.5" (1.715 m)   Wt 169 lb (76.7 kg)   BMI 26.08 kg/m   Wt Readings from Last 3 Encounters:  05/22/22 169 lb (76.7 kg)  04/30/22 172 lb 9.6 oz (78.3 kg)  03/11/22 176 lb 3.2 oz (79.9 kg)    Physical Exam Vitals and  nursing note reviewed.  Constitutional:      General: He is not in acute distress.    Appearance: He is well-developed. He is not diaphoretic.     Comments: Well-appearing, comfortable, cooperative  HENT:     Head: Normocephalic and atraumatic.  Eyes:     General:        Right eye: No discharge.        Left eye: No discharge.     Conjunctiva/sclera: Conjunctivae normal.  Neck:     Thyroid: No thyromegaly.  Cardiovascular:     Rate and Rhythm: Normal rate and regular rhythm.     Pulses: Normal pulses.     Heart sounds: Normal heart sounds. No murmur heard. Pulmonary:     Effort: Pulmonary effort is normal. No respiratory distress.     Breath sounds: Normal breath sounds. No wheezing or rales.  Musculoskeletal:        General: Normal range of motion.     Cervical back: Normal range of motion and neck supple.  Lymphadenopathy:     Cervical: No  cervical adenopathy.  Skin:    General: Skin is warm and dry.     Findings: No erythema or rash.  Neurological:     Mental Status: He is alert and oriented to person, place, and time. Mental status is at baseline.  Psychiatric:        Behavior: Behavior normal.     Comments: Well groomed, good eye contact, normal speech and thoughts     I have personally reviewed the radiology report from 04/30/22 on CXR.  CLINICAL DATA:  Left-sided pleurisy for 3 weeks.  No cough.   EXAM:  CHEST - 2 VIEW   COMPARISON:  Radiographs 11/28/2021 and 09/15/2020.   FINDINGS:  The heart size and mediastinal contours are stable. There is aortic  atherosclerosis and a left-sided coronary artery stent. Chronic mild  elevation of the left hemidiaphragm and left basilar scarring,  similar to previous studies. The right lung is clear. There is no  pleural effusion or pneumothorax. The bones appear unremarkable.   IMPRESSION:  No acute cardiopulmonary process. Chronic left basilar scarring and  elevation of the left hemidiaphragm.    Electronically  Signed    By: Carey Bullocks M.D.    On: 04/30/2022 14:10   Results for orders placed or performed during the hospital encounter of 11/28/21  Basic metabolic panel  Result Value Ref Range   Sodium 141 135 - 145 mmol/L   Potassium 3.9 3.5 - 5.1 mmol/L   Chloride 111 98 - 111 mmol/L   CO2 23 22 - 32 mmol/L   Glucose, Bld 141 (H) 70 - 99 mg/dL   BUN 25 (H) 8 - 23 mg/dL   Creatinine, Ser 6.57 0.61 - 1.24 mg/dL   Calcium 9.0 8.9 - 84.6 mg/dL   GFR, Estimated 56 (L) >60 mL/min   Anion gap 7 5 - 15  CBC  Result Value Ref Range   WBC 8.2 4.0 - 10.5 K/uL   RBC 4.00 (L) 4.22 - 5.81 MIL/uL   Hemoglobin 12.7 (L) 13.0 - 17.0 g/dL   HCT 96.2 (L) 95.2 - 84.1 %   MCV 96.8 80.0 - 100.0 fL   MCH 31.8 26.0 - 34.0 pg   MCHC 32.8 30.0 - 36.0 g/dL   RDW 32.4 40.1 - 02.7 %   Platelets 219 150 - 400 K/uL   nRBC 0.0 0.0 - 0.2 %  Troponin I (High Sensitivity)  Result Value Ref Range   Troponin I (High Sensitivity) 7 <18 ng/L  Troponin I (High Sensitivity)  Result Value Ref Range   Troponin I (High Sensitivity) 9 <18 ng/L      Assessment & Plan:   Problem List Items Addressed This Visit     GERD (gastroesophageal reflux disease) - Primary     Keep on Pantoprazole for stomach acid and carafate for stomach acid  We cannot continue Prednisone  Limit Aspirin to avoid provoking GERD  Take Novolog insulin ONLY with meal. Skip in morning if not eating.  Okay to take long acting insulin if miss morning meal.  No orders of the defined types were placed in this encounter.     Follow up plan: Return if symptoms worsen or fail to improve.   Saralyn Pilar, DO [facility] 05/22/2022, 9:48 AM

## 2022-05-27 ENCOUNTER — Ambulatory Visit: Payer: No Typology Code available for payment source

## 2022-05-29 ENCOUNTER — Ambulatory Visit: Payer: No Typology Code available for payment source

## 2022-06-03 ENCOUNTER — Ambulatory Visit: Payer: No Typology Code available for payment source

## 2022-06-05 ENCOUNTER — Ambulatory Visit: Payer: No Typology Code available for payment source

## 2022-06-10 ENCOUNTER — Ambulatory Visit: Payer: No Typology Code available for payment source

## 2022-06-12 ENCOUNTER — Ambulatory Visit: Payer: No Typology Code available for payment source

## 2022-06-17 ENCOUNTER — Ambulatory Visit: Payer: No Typology Code available for payment source

## 2022-06-19 ENCOUNTER — Ambulatory Visit: Payer: No Typology Code available for payment source

## 2022-06-24 ENCOUNTER — Ambulatory Visit: Payer: No Typology Code available for payment source

## 2022-06-26 ENCOUNTER — Ambulatory Visit: Payer: No Typology Code available for payment source

## 2022-07-01 ENCOUNTER — Ambulatory Visit: Payer: No Typology Code available for payment source

## 2022-07-03 ENCOUNTER — Ambulatory Visit: Payer: No Typology Code available for payment source

## 2022-07-08 ENCOUNTER — Ambulatory Visit: Payer: No Typology Code available for payment source

## 2022-07-10 ENCOUNTER — Ambulatory Visit: Payer: No Typology Code available for payment source

## 2022-07-21 ENCOUNTER — Encounter: Payer: Self-pay | Admitting: Family Medicine

## 2022-07-21 ENCOUNTER — Ambulatory Visit (INDEPENDENT_AMBULATORY_CARE_PROVIDER_SITE_OTHER): Payer: 59 | Admitting: Family Medicine

## 2022-07-21 VITALS — BP 122/52 | HR 65 | Ht 67.5 in | Wt 175.0 lb

## 2022-07-21 DIAGNOSIS — K2100 Gastro-esophageal reflux disease with esophagitis, without bleeding: Secondary | ICD-10-CM | POA: Diagnosis not present

## 2022-07-21 NOTE — Patient Instructions (Addendum)
Thank you for coming to the office today.  We will help you coordinate with Jacob Moores PA at the [facility], GI they will need to do more testing and imaging next and maybe endoscopy.  [facility] [address] [place], [place] [address] Hours: 8AM-5PM Phone: [phone]   Please schedule a Follow-up Appointment to: Return if symptoms worsen or fail to improve.  If you have any other questions or concerns, please feel free to call the office or send a message through MyChart. You may also schedule an earlier appointment if necessary.  Additionally, you may be receiving a survey about your experience at our office within a few days to 1 week by e-mail or mail. We value your feedback.  Saralyn Pilar, DO [facility] [place], [place]

## 2022-07-21 NOTE — Progress Notes (Signed)
Subjective:    Patient ID: Charles York, male    DOB: 1930/12/26, 89 y.o.   MRN: [id number]  Charles York is a 89 y.o. male presenting on 07/21/2022 for Dysphagia (Trouble swallowing and getting worse)   HPI  Follow-up  GERD PUD with radiation of symptoms   Recent visit 3/27 thought to have more pleuritic pain based on history. Completed Prednisone   He was taking Aspirin higher dose recently regularly and believes it was flaring up his stomach ulcer   He has been taking Pantoprazole 40mg  twice a day with improvement. He has taken Carafate that has helped his symptoms as well. He has refills Eating drinking without difficulty now   Seen by Jacob Moores at [facility] 04/2022, should be doing upper GI imaging. But has not been arranged yet.  Today he still reports symptoms, has not returned again to GI and asking on status or updates.  No new symptoms or concerns.      03/11/2022   11:39 AM 10/18/2021    9:46 AM 09/16/2021    1:19 PM  Depression screen PHQ 2/9  Decreased Interest 2 0 0  Down, Depressed, Hopeless 2 0 0  PHQ - 2 Score 4 0 0  Altered sleeping 3 0   Tired, decreased energy 2 0   Change in appetite 0 0   Feeling bad or failure about yourself  0 0   Trouble concentrating 0 0   Moving slowly or fidgety/restless 1 0   Suicidal thoughts 0 0   PHQ-9 Score 10 0   Difficult doing work/chores Somewhat difficult Not difficult at all     Social History   Tobacco Use   Smoking status: Former    Packs/day: 0.25    Years: 30.00    Additional pack years: 0.00    Total pack years: 7.50    Types: Cigarettes    Quit date: 02/04/1983    Years since quitting: 39.4   Smokeless tobacco: Former  Building services engineer Use: Never used  Substance Use Topics   Alcohol use: No    Alcohol/week: 0.0 standard drinks of alcohol   Drug use: No    Review of Systems Per HPI unless specifically indicated above     Objective:    BP (!) 122/52   Pulse 65   Ht 5' 7.5" (1.715 m)    Wt 175 lb (79.4 kg)   SpO2 99%   BMI 27.00 kg/m   Wt Readings from Last 3 Encounters:  07/21/22 175 lb (79.4 kg)  05/22/22 169 lb (76.7 kg)  04/30/22 172 lb 9.6 oz (78.3 kg)    Physical Exam Vitals and nursing note reviewed.  Constitutional:      General: He is not in acute distress.    Appearance: Normal appearance. He is well-developed. He is not diaphoretic.     Comments: Well-appearing, comfortable, cooperative  HENT:     Head: Normocephalic and atraumatic.  Eyes:     General:        Right eye: No discharge.        Left eye: No discharge.     Conjunctiva/sclera: Conjunctivae normal.  Cardiovascular:     Rate and Rhythm: Normal rate.  Pulmonary:     Effort: Pulmonary effort is normal.  Skin:    General: Skin is warm and dry.     Findings: No erythema or rash.  Neurological:     Mental Status: He is alert and oriented  to person, place, and time.  Psychiatric:        Mood and Affect: Mood normal.        Behavior: Behavior normal.        Thought Content: Thought content normal.     Comments: Well groomed, good eye contact, normal speech and thoughts       Results for orders placed or performed during the hospital encounter of 11/28/21  Basic metabolic panel  Result Value Ref Range   Sodium 141 135 - 145 mmol/L   Potassium 3.9 3.5 - 5.1 mmol/L   Chloride 111 98 - 111 mmol/L   CO2 23 22 - 32 mmol/L   Glucose, Bld 141 (H) 70 - 99 mg/dL   BUN 25 (H) 8 - 23 mg/dL   Creatinine, Ser 1.61 0.61 - 1.24 mg/dL   Calcium 9.0 8.9 - 09.6 mg/dL   GFR, Estimated 56 (L) >60 mL/min   Anion gap 7 5 - 15  CBC  Result Value Ref Range   WBC 8.2 4.0 - 10.5 K/uL   RBC 4.00 (L) 4.22 - 5.81 MIL/uL   Hemoglobin 12.7 (L) 13.0 - 17.0 g/dL   HCT 04.5 (L) 40.9 - 81.1 %   MCV 96.8 80.0 - 100.0 fL   MCH 31.8 26.0 - 34.0 pg   MCHC 32.8 30.0 - 36.0 g/dL   RDW 91.4 78.2 - 95.6 %   Platelets 219 150 - 400 K/uL   nRBC 0.0 0.0 - 0.2 %  Troponin I (High Sensitivity)  Result Value Ref Range    Troponin I (High Sensitivity) 7 <18 ng/L  Troponin I (High Sensitivity)  Result Value Ref Range   Troponin I (High Sensitivity) 9 <18 ng/L      Assessment & Plan:   Problem List Items Addressed This Visit     GERD (gastroesophageal reflux disease) - Primary    No orders of the defined types were placed in this encounter.  Still refractory GERD symptoms. Upper GI concerns persist despite medication from previous visits  Continues on Pantoprazole 40mg  twice a day and Sucralfate AS NEEDED, some relief but not resolving.  He was managed by [facility] previously but waiting on further imaging / testing.  We will help you coordinate with Jacob Moores PA at the [facility], GI they will need to do more testing and imaging next and maybe endoscopy.  [facility] [address] [place], [place] [address] Hours: 8AM-5PM Phone: [phone]    Follow up plan: Return if symptoms worsen or fail to improve.    Saralyn Pilar, DO [facility] 07/21/2022, 11:40 AM

## 2022-07-24 ENCOUNTER — Other Ambulatory Visit: Payer: Self-pay | Admitting: Gastroenterology

## 2022-07-24 ENCOUNTER — Other Ambulatory Visit: Payer: Self-pay | Admitting: Family Medicine

## 2022-07-24 DIAGNOSIS — K219 Gastro-esophageal reflux disease without esophagitis: Secondary | ICD-10-CM

## 2022-07-24 DIAGNOSIS — R1013 Epigastric pain: Secondary | ICD-10-CM

## 2022-07-24 DIAGNOSIS — R0789 Other chest pain: Secondary | ICD-10-CM

## 2022-07-24 DIAGNOSIS — R630 Anorexia: Secondary | ICD-10-CM

## 2022-07-24 DIAGNOSIS — R143 Flatulence: Secondary | ICD-10-CM

## 2022-07-24 DIAGNOSIS — G459 Transient cerebral ischemic attack, unspecified: Secondary | ICD-10-CM

## 2022-07-24 NOTE — Telephone Encounter (Signed)
Requested Prescriptions  Pending Prescriptions Disp Refills   clopidogrel (PLAVIX) 75 MG tablet [Pharmacy Med Name: CLOPIDOGREL 75 MG TABLET] 90 tablet 0    Sig: TAKE 1 TABLET BY MOUTH EVERY DAY     Hematology: Antiplatelets - clopidogrel Failed - 07/24/2022  2:31 AM      Failed - HCT in normal range and within 180 days    HCT  Date Value Ref Range Status  11/28/2021 38.7 (L) 39.0 - 52.0 % Final   Hematocrit  Date Value Ref Range Status  10/28/2017 39.6 37.5 - 51.0 % Final         Failed - HGB in normal range and within 180 days    Hemoglobin  Date Value Ref Range Status  11/28/2021 12.7 (L) 13.0 - 17.0 g/dL Final  28/41/3244 01.0 13.0 - 17.7 g/dL Final         Failed - PLT in normal range and within 180 days    Platelets  Date Value Ref Range Status  11/28/2021 219 150 - 400 K/uL Final  10/28/2017 248 150 - 450 x10E3/uL Final         Passed - Cr in normal range and within 360 days    Creat  Date Value Ref Range Status  10/11/2021 1.45 (H) 0.70 - 1.22 mg/dL Final   Creatinine, Ser  Date Value Ref Range Status  11/28/2021 1.23 0.61 - 1.24 mg/dL Final         Passed - Valid encounter within last 6 months    Recent Outpatient Visits           3 days ago Gastroesophageal reflux disease with esophagitis without hemorrhage   [place] [facility] Smitty Cords, DO   2 months ago Gastroesophageal reflux disease with esophagitis without hemorrhage   [facility] Smitty Cords, DO   2 months ago Pleurisy   [place] [facility] Smitty Cords, DO   5 months ago Gastroesophageal reflux disease with esophagitis without hemorrhage    [facility] [place], Netta Neat, DO   9 months ago Annual physical exam   [facility] [place], Netta Neat, [place]

## 2022-07-29 ENCOUNTER — Ambulatory Visit
Admission: RE | Admit: 2022-07-29 | Discharge: 2022-07-29 | Disposition: A | Discharge status: Home / Self Care | Payer: No Typology Code available for payment source | Source: Ambulatory Visit | Attending: Gastroenterology | Admitting: Gastroenterology

## 2022-07-29 DIAGNOSIS — K219 Gastro-esophageal reflux disease without esophagitis: Secondary | ICD-10-CM | POA: Diagnosis not present

## 2022-07-29 DIAGNOSIS — R0789 Other chest pain: Secondary | ICD-10-CM | POA: Diagnosis present

## 2022-07-29 DIAGNOSIS — K224 Dyskinesia of esophagus: Secondary | ICD-10-CM | POA: Diagnosis not present

## 2022-07-29 DIAGNOSIS — R143 Flatulence: Secondary | ICD-10-CM | POA: Insufficient documentation

## 2022-07-29 DIAGNOSIS — R1013 Epigastric pain: Secondary | ICD-10-CM | POA: Diagnosis present

## 2022-07-29 DIAGNOSIS — R630 Anorexia: Secondary | ICD-10-CM | POA: Diagnosis present

## 2022-08-12 ENCOUNTER — Ambulatory Visit (INDEPENDENT_AMBULATORY_CARE_PROVIDER_SITE_OTHER): Payer: 59 | Admitting: Family Medicine

## 2022-08-12 ENCOUNTER — Encounter: Payer: Self-pay | Admitting: Family Medicine

## 2022-08-12 VITALS — BP 138/62 | HR 67 | Temp 97.7°F | Ht 67.5 in | Wt 171.4 lb

## 2022-08-12 DIAGNOSIS — K2100 Gastro-esophageal reflux disease with esophagitis, without bleeding: Secondary | ICD-10-CM | POA: Diagnosis not present

## 2022-08-12 DIAGNOSIS — E1140 Type 2 diabetes mellitus with diabetic neuropathy, unspecified: Secondary | ICD-10-CM

## 2022-08-12 DIAGNOSIS — K224 Dyskinesia of esophagus: Secondary | ICD-10-CM

## 2022-08-12 LAB — POCT GLYCOSYLATED HEMOGLOBIN (HGB A1C): Hemoglobin A1C: 8.1 % — AB (ref 4.0–5.6)

## 2022-08-12 MED ORDER — GVOKE HYPOPEN 2-PACK 1 MG/0.2ML ~~LOC~~ SOAJ
1.00 mg | SUBCUTANEOUS | 2 refills | Status: AC | PRN
Start: 2022-08-12 — End: ?

## 2022-08-12 NOTE — Progress Notes (Signed)
Subjective:    Patient ID: Charles York, male    DOB: February 18, 1930, 89 y.o.   MRN: [id number]  Charles York is a 89 y.o. male presenting on 08/12/2022 for Gastroesophageal Reflux (Pt comes in today to discuss Upper GI Series [place] )   HPI  Follow-up GERD PUD with radiation of symptoms  Interval update, he has completed Upper GI series barium swallow, see results below, with identified mod to severe esophageal dysmotility and severe gastroesophageal reflux  He has continued on Pantoprazole 40mg  twice a day with relief. He has stopped Carafate since ineffective.  Improved with smoothie addition to diet every other day not causing or provoking any reflux.  He does not need a new refill today  He has upcoming apt [facility] Jacob Moores PA in Sept 2024, we will inquire if can be seen sooner.  He continues to take Aspirin and Plavix.  Continues to do well and eating drinking without difficulty now but still concerned with episodic flares with GERD  CHRONIC DM, Type 2: Reports no concerns Prior A1c 7 range CBGs: Avg 150 and under Meds: Lantus 25 units daily and Novolog mix 12 u BID Reports  good compliance. Tolerating well w/o side-effects No longer on ARB Lifestyle: - Diet (improved)  Due DM Eye Exam Admits some hypoglycemia CBG 60s, not feeling hypoglycemia       08/12/2022    1:39 PM 03/11/2022   11:39 AM 10/18/2021    9:46 AM  Depression screen PHQ 2/9  Decreased Interest 0 2 0  Down, Depressed, Hopeless 0 2 0  PHQ - 2 Score 0 4 0  Altered sleeping 0 3 0  Tired, decreased energy 2 2 0  Change in appetite 2 0 0  Feeling bad or failure about yourself  0 0 0  Trouble concentrating 0 0 0  Moving slowly or fidgety/restless 0 1 0  Suicidal thoughts 0 0 0  PHQ-9 Score 4 10 0  Difficult doing work/chores Not difficult at all Somewhat difficult Not difficult at all    Social History   Tobacco Use   Smoking status: Former    Packs/day: 0.25    Years: 30.00    Additional  pack years: 0.00    Total pack years: 7.50    Types: Cigarettes    Quit date: 02/04/1983    Years since quitting: 39.5   Smokeless tobacco: Former  Building services engineer Use: Never used  Substance Use Topics   Alcohol use: No    Alcohol/week: 0.0 standard drinks of alcohol   Drug use: No    Review of Systems Per HPI unless specifically indicated above     Objective:    BP 138/62 (BP Location: Right Arm, Patient Position: Sitting, Cuff Size: Normal)   Pulse 67   Temp 97.7 F (36.5 C) (Oral)   Ht 5' 7.5" (1.715 m)   Wt 171 lb 6.4 oz (77.7 kg)   SpO2 98%   BMI 26.45 kg/m   Wt Readings from Last 3 Encounters:  08/12/22 171 lb 6.4 oz (77.7 kg)  07/21/22 175 lb (79.4 kg)  05/22/22 169 lb (76.7 kg)    Physical Exam Vitals and nursing note reviewed.  Constitutional:      General: He is not in acute distress.    Appearance: He is well-developed. He is not diaphoretic.     Comments: Well-appearing, comfortable, cooperative  HENT:     Head: Normocephalic and atraumatic.  Eyes:  General:        Right eye: No discharge.        Left eye: No discharge.     Conjunctiva/sclera: Conjunctivae normal.  Neck:     Thyroid: No thyromegaly.  Cardiovascular:     Rate and Rhythm: Normal rate and regular rhythm.     Pulses: Normal pulses.     Heart sounds: Normal heart sounds. No murmur heard. Pulmonary:     Effort: Pulmonary effort is normal. No respiratory distress.     Breath sounds: Normal breath sounds. No wheezing or rales.  Musculoskeletal:        General: Normal range of motion.     Cervical back: Normal range of motion and neck supple.  Lymphadenopathy:     Cervical: No cervical adenopathy.  Skin:    General: Skin is warm and dry.     Findings: No erythema or rash.  Neurological:     Mental Status: He is alert and oriented to person, place, and time. Mental status is at baseline.  Psychiatric:        Behavior: Behavior normal.     Comments: Well groomed, good eye  contact, normal speech and thoughts     I have personally reviewed the radiology report from 07/29/22 on UGI Series.  CLINICAL DATA:  Patient with a history of GERD, atypical chest pain and loss of appetite.   EXAM: UPPER GI SERIES WITH HIGH DENSITY WITHOUT KUB   TECHNIQUE: Combined double and single contrast examination was performed using effervescent crystals, high-density barium and thin liquid barium. This exam was performed by Alwyn Ren, NP, and was supervised and interpreted by Dr. Roda Shutters.   FLUOROSCOPY: Radiation Exposure Index (as provided by the fluoroscopic device): 29.40 mGy Kerma   COMPARISON:  CT abdomen and pelvis dated 09/15/2020   FINDINGS: Esophagus: Normal appearance.   Esophageal motility: Moderate to severe dysmotility with intermittent tertiary contractions. Minimal delay in transit of barium through the esophagus to the stomach.   Gastroesophageal reflux: Severe reflux. Multiple instances of spontaneous large volume reflux to the level of the cervical esophagus.   Ingested 13mm barium tablet: No obstruction to the passage of a 13 mm tablet.   Stomach: Normal appearance. No hiatal hernia.   Gastric emptying: Normal.   Duodenum: Normal appearance.   Other:  Multiple small bowel diverticula.   IMPRESSION: 1. Moderate to severe esophageal dysmotility. 2. Severe gastroesophageal reflux with multiple instances of spontaneous large volume reflux to the level of the cervical esophagus.     Electronically Signed   By: Agustin Cree M.D.   On: 07/29/2022 09:56  Results for orders placed or performed in visit on 08/12/22  POCT glycosylated hemoglobin (Hb A1C)  Result Value Ref Range   Hemoglobin A1C 8.1 (A) 4.0 - 5.6 %      Assessment & Plan:   Problem List Items Addressed This Visit     GERD (gastroesophageal reflux disease) - Primary   Type 2 diabetes, controlled, with neuropathy (HCC)    Elevated A1c up to 8.1 today Complications -  CKD-III, peripheral neuropathy, DM retinopathy OFF Trulicity due to side effect Rare hypoglycemia  Plan:  REDUCE insulin dosages due to concern with hypoglycemia episodes REDUCE Lantus from 25 > 20 u REDUCE Novolog from 12 TWICE A DAY wc to 8-10 units  Encourage improved lifestyle - low carb, low sugar diet, reduce portion size, continue improving regular exercise Check CBG, bring log to next visit for review Continue ASA, ARB, Statin  Rx GVOKE Hypoglycemia pen for emergency use, demonstrate and showed video.      Relevant Medications   GVOKE HYPOPEN 2-PACK 1 MG/0.2ML SOAJ   Other Relevant Orders   POCT glycosylated hemoglobin (Hb A1C) (Completed)   Other Visit Diagnoses     Esophageal dysmotility           Chronic GERD / Severe Esophageal Dysmotility identified UGI series imaging completed by [facility] Followed by Jacob Moores PA Review results above  He is improving. But still has episodic flares  Continue Pantoprazole 40mg  twice a day, no refill needed yet.  DC Carafate ineffective.  Improved on smoothie diet  Will route chart and note to [employer] for review and consider sooner scheduling, already scheduled for f/u in Sept 2024.    Meds ordered this encounter  Medications   GVOKE HYPOPEN 2-PACK 1 MG/0.2ML SOAJ    Sig: Inject 1 mg into the skin as needed (hypoglycemia).    Dispense:  1 mL    Refill:  2    Forward to [employer] PA   Follow up plan: Return in about 3 months (around 11/12/2022) for 3 month fasting lab only then 1 week later Annual Physical.   Saralyn Pilar, DO [facility] 08/12/2022, 1:50 PM

## 2022-08-12 NOTE — Patient Instructions (Addendum)
Thank you for coming to the office today.  The Upper GI Imaging shows severe esophageal dysfunction and acid reflux  I will contact your GI doctor and ask them to see you sooner  Otilio Miu, PA  [address]  [place], [place] [address]  [phone] (Work)  ---------  A1c test today  I am worried about low sugars.  Take the [facility] Emergency Pen injection, single dose per pen, remove red cap, push into skin for 5 seconds for emergency only - Glucose sugar 50s-60s  My recommendation is to REDUCE your insulin:  Lantus daily injection down to 20 (instead of 25)  Novolog meal injections down to 8-10 (instead of 12)  DUE for FASTING BLOOD WORK (no food or drink after midnight before the lab appointment, only water or coffee without cream/sugar on the morning of)  SCHEDULE "Lab Only" visit in the morning at the clinic for lab draw in 3 MONTHS   - Make sure Lab Only appointment is at about 1 week before your next appointment, so that results will be available  For Lab Results, once available within 2-3 days of blood draw, you can can log in to MyChart online to view your results and a brief explanation. Also, we can discuss results at next follow-up visit.   Please schedule a Follow-up Appointment to: Return in about 3 months (around 11/12/2022) for 3 month fasting lab only then 1 week later Annual Physical.  If you have any other questions or concerns, please feel free to call the office or send a message through MyChart. You may also schedule an earlier appointment if necessary.  Additionally, you may be receiving a survey about your experience at our office within a few days to 1 week by e-mail or mail. We value your feedback.  Saralyn Pilar, DO [facility], [place]

## 2022-08-12 NOTE — Assessment & Plan Note (Signed)
Elevated A1c up to 8.1 today Complications - CKD-III, peripheral neuropathy, DM retinopathy OFF Trulicity due to side effect Rare hypoglycemia  Plan:  REDUCE insulin dosages due to concern with hypoglycemia episodes REDUCE Lantus from 25 > 20 u REDUCE Novolog from 12 TWICE A DAY wc to 8-10 units  Encourage improved lifestyle - low carb, low sugar diet, reduce portion size, continue improving regular exercise Check CBG, bring log to next visit for review Continue ASA, ARB, Statin  Rx GVOKE Hypoglycemia pen for emergency use, demonstrate and showed video.

## 2022-09-12 DIAGNOSIS — Z79899 Other long term (current) drug therapy: Secondary | ICD-10-CM | POA: Diagnosis not present

## 2022-09-12 DIAGNOSIS — S199XXA Unspecified injury of neck, initial encounter: Secondary | ICD-10-CM | POA: Diagnosis not present

## 2022-09-12 DIAGNOSIS — W19XXXA Unspecified fall, initial encounter: Secondary | ICD-10-CM | POA: Diagnosis not present

## 2022-09-12 DIAGNOSIS — I440 Atrioventricular block, first degree: Secondary | ICD-10-CM | POA: Diagnosis not present

## 2022-09-12 DIAGNOSIS — E1122 Type 2 diabetes mellitus with diabetic chronic kidney disease: Secondary | ICD-10-CM | POA: Diagnosis not present

## 2022-09-12 DIAGNOSIS — R0789 Other chest pain: Secondary | ICD-10-CM | POA: Diagnosis not present

## 2022-09-12 DIAGNOSIS — Z87891 Personal history of nicotine dependence: Secondary | ICD-10-CM | POA: Diagnosis not present

## 2022-09-12 DIAGNOSIS — J986 Disorders of diaphragm: Secondary | ICD-10-CM | POA: Diagnosis not present

## 2022-09-12 DIAGNOSIS — N183 Chronic kidney disease, stage 3 unspecified: Secondary | ICD-10-CM | POA: Diagnosis not present

## 2022-09-12 DIAGNOSIS — S0990XA Unspecified injury of head, initial encounter: Secondary | ICD-10-CM | POA: Diagnosis not present

## 2022-09-12 DIAGNOSIS — R001 Bradycardia, unspecified: Secondary | ICD-10-CM | POA: Diagnosis not present

## 2022-09-12 DIAGNOSIS — I371 Nonrheumatic pulmonary valve insufficiency: Secondary | ICD-10-CM | POA: Diagnosis not present

## 2022-09-12 DIAGNOSIS — I2510 Atherosclerotic heart disease of native coronary artery without angina pectoris: Secondary | ICD-10-CM | POA: Diagnosis not present

## 2022-09-12 DIAGNOSIS — Z955 Presence of coronary angioplasty implant and graft: Secondary | ICD-10-CM | POA: Diagnosis not present

## 2022-09-12 DIAGNOSIS — R079 Chest pain, unspecified: Secondary | ICD-10-CM | POA: Diagnosis not present

## 2022-09-12 DIAGNOSIS — I517 Cardiomegaly: Secondary | ICD-10-CM | POA: Diagnosis not present

## 2022-09-12 DIAGNOSIS — I130 Hypertensive heart and chronic kidney disease with heart failure and stage 1 through stage 4 chronic kidney disease, or unspecified chronic kidney disease: Secondary | ICD-10-CM | POA: Diagnosis not present

## 2022-09-12 DIAGNOSIS — I5022 Chronic systolic (congestive) heart failure: Secondary | ICD-10-CM | POA: Diagnosis not present

## 2022-09-12 DIAGNOSIS — Z794 Long term (current) use of insulin: Secondary | ICD-10-CM | POA: Diagnosis not present

## 2022-09-12 DIAGNOSIS — I083 Combined rheumatic disorders of mitral, aortic and tricuspid valves: Secondary | ICD-10-CM | POA: Diagnosis not present

## 2022-09-13 DIAGNOSIS — I440 Atrioventricular block, first degree: Secondary | ICD-10-CM | POA: Diagnosis not present

## 2022-09-13 DIAGNOSIS — W19XXXA Unspecified fall, initial encounter: Secondary | ICD-10-CM | POA: Diagnosis not present

## 2022-09-13 DIAGNOSIS — R079 Chest pain, unspecified: Secondary | ICD-10-CM | POA: Diagnosis not present

## 2022-09-14 DIAGNOSIS — R079 Chest pain, unspecified: Secondary | ICD-10-CM | POA: Diagnosis not present

## 2022-09-14 DIAGNOSIS — I083 Combined rheumatic disorders of mitral, aortic and tricuspid valves: Secondary | ICD-10-CM | POA: Diagnosis not present

## 2022-09-14 DIAGNOSIS — I371 Nonrheumatic pulmonary valve insufficiency: Secondary | ICD-10-CM | POA: Diagnosis not present

## 2022-09-14 DIAGNOSIS — I517 Cardiomegaly: Secondary | ICD-10-CM | POA: Diagnosis not present

## 2022-09-14 DIAGNOSIS — W19XXXA Unspecified fall, initial encounter: Secondary | ICD-10-CM | POA: Diagnosis not present

## 2022-09-17 ENCOUNTER — Telehealth: Payer: Self-pay | Admitting: Family Medicine

## 2022-09-17 NOTE — Telephone Encounter (Signed)
Called both numbers on file to change AWV office visit to a telephone visit due to the nurse is working from home this week. Unable to leave message on both numbers. Please confirm appt changes khc    Verlee Rossetti; Care Guide Ambulatory Clinical Support [place] [facility] Direct Dial: [phone]

## 2022-09-18 ENCOUNTER — Telehealth: Payer: Self-pay | Admitting: Family Medicine

## 2022-09-18 NOTE — Telephone Encounter (Signed)
Called 09/18/22 to r/s AWV appt called both numbers. Unable to leave message on both numbers. Need to be recheduled khc.   Verlee Rossetti; Care Guide Ambulatory Clinical Support [place] [facility] Direct Dial: [phone]

## 2022-09-20 DIAGNOSIS — I252 Old myocardial infarction: Secondary | ICD-10-CM | POA: Diagnosis not present

## 2022-09-20 DIAGNOSIS — J9811 Atelectasis: Secondary | ICD-10-CM | POA: Diagnosis not present

## 2022-09-20 DIAGNOSIS — R918 Other nonspecific abnormal finding of lung field: Secondary | ICD-10-CM | POA: Diagnosis not present

## 2022-09-20 DIAGNOSIS — I5021 Acute systolic (congestive) heart failure: Secondary | ICD-10-CM | POA: Diagnosis not present

## 2022-09-20 DIAGNOSIS — N183 Chronic kidney disease, stage 3 unspecified: Secondary | ICD-10-CM | POA: Diagnosis not present

## 2022-09-20 DIAGNOSIS — I2510 Atherosclerotic heart disease of native coronary artery without angina pectoris: Secondary | ICD-10-CM | POA: Diagnosis not present

## 2022-09-20 DIAGNOSIS — E1122 Type 2 diabetes mellitus with diabetic chronic kidney disease: Secondary | ICD-10-CM | POA: Diagnosis not present

## 2022-09-20 DIAGNOSIS — J986 Disorders of diaphragm: Secondary | ICD-10-CM | POA: Diagnosis not present

## 2022-09-20 DIAGNOSIS — Z20822 Contact with and (suspected) exposure to covid-19: Secondary | ICD-10-CM | POA: Diagnosis not present

## 2022-09-20 DIAGNOSIS — Z955 Presence of coronary angioplasty implant and graft: Secondary | ICD-10-CM | POA: Diagnosis not present

## 2022-09-20 DIAGNOSIS — Z87891 Personal history of nicotine dependence: Secondary | ICD-10-CM | POA: Diagnosis not present

## 2022-09-20 DIAGNOSIS — I440 Atrioventricular block, first degree: Secondary | ICD-10-CM | POA: Diagnosis not present

## 2022-09-20 DIAGNOSIS — I129 Hypertensive chronic kidney disease with stage 1 through stage 4 chronic kidney disease, or unspecified chronic kidney disease: Secondary | ICD-10-CM | POA: Diagnosis not present

## 2022-09-20 DIAGNOSIS — K219 Gastro-esophageal reflux disease without esophagitis: Secondary | ICD-10-CM | POA: Diagnosis not present

## 2022-09-20 DIAGNOSIS — R0602 Shortness of breath: Secondary | ICD-10-CM | POA: Diagnosis not present

## 2022-10-18 ENCOUNTER — Other Ambulatory Visit: Payer: Self-pay | Admitting: Family Medicine

## 2022-10-18 DIAGNOSIS — G459 Transient cerebral ischemic attack, unspecified: Secondary | ICD-10-CM

## 2022-11-11 ENCOUNTER — Telehealth: Payer: Self-pay | Admitting: Family Medicine

## 2022-11-11 NOTE — Telephone Encounter (Signed)
Called 11/11/2022 to sched AWV - NO VOICEMAIL  Verlee Rossetti; Care Guide Ambulatory Clinical Support [place] [facility] Direct Dial: [phone]

## 2022-11-26 DIAGNOSIS — L821 Other seborrheic keratosis: Secondary | ICD-10-CM | POA: Diagnosis not present

## 2022-11-26 DIAGNOSIS — L570 Actinic keratosis: Secondary | ICD-10-CM | POA: Diagnosis not present

## 2022-11-28 ENCOUNTER — Other Ambulatory Visit: Payer: Self-pay

## 2022-11-28 DIAGNOSIS — N1831 Chronic kidney disease, stage 3a: Secondary | ICD-10-CM

## 2022-11-28 DIAGNOSIS — N183 Chronic kidney disease, stage 3 unspecified: Secondary | ICD-10-CM

## 2022-11-28 DIAGNOSIS — E1169 Type 2 diabetes mellitus with other specified complication: Secondary | ICD-10-CM

## 2022-11-28 DIAGNOSIS — E1140 Type 2 diabetes mellitus with diabetic neuropathy, unspecified: Secondary | ICD-10-CM

## 2022-12-01 ENCOUNTER — Other Ambulatory Visit: Payer: 59

## 2022-12-01 DIAGNOSIS — N183 Chronic kidney disease, stage 3 unspecified: Secondary | ICD-10-CM

## 2022-12-01 DIAGNOSIS — E1140 Type 2 diabetes mellitus with diabetic neuropathy, unspecified: Secondary | ICD-10-CM

## 2022-12-01 DIAGNOSIS — N1831 Chronic kidney disease, stage 3a: Secondary | ICD-10-CM

## 2022-12-01 DIAGNOSIS — E1169 Type 2 diabetes mellitus with other specified complication: Secondary | ICD-10-CM

## 2022-12-12 ENCOUNTER — Encounter: Payer: 59 | Admitting: Family Medicine

## 2023-01-02 ENCOUNTER — Other Ambulatory Visit: Payer: Self-pay | Admitting: Family Medicine

## 2023-01-02 DIAGNOSIS — K2100 Gastro-esophageal reflux disease with esophagitis, without bleeding: Secondary | ICD-10-CM

## 2023-01-05 NOTE — Telephone Encounter (Signed)
Requested Prescriptions  Pending Prescriptions Disp Refills   pantoprazole (PROTONIX) 40 MG tablet [Pharmacy Med Name: PANTOPRAZOLE SOD DR 40 MG TAB] 90 tablet 0    Sig: TAKE 1 TABLET BY MOUTH DAILY BEFORE BREAKFAST     Gastroenterology: Proton Pump Inhibitors Passed - 01/02/2023  1:42 AM      Passed - Valid encounter within last 12 months    Recent Outpatient Visits           4 months ago Gastroesophageal reflux disease with esophagitis without hemorrhage   [place] [facility] [place], Netta Neat, DO   5 months ago Gastroesophageal reflux disease with esophagitis without hemorrhage   [place] [facility] [place], Netta Neat, DO   7 months ago Gastroesophageal reflux disease with esophagitis without hemorrhage   [place] [facility] Smitty Cords, DO   8 months ago Pleurisy   [facility] Smitty Cords, DO   10 months ago Gastroesophageal reflux disease with esophagitis without hemorrhage   [facility] [place], Netta Neat, [place]

## 2023-01-16 ENCOUNTER — Other Ambulatory Visit: Payer: Self-pay | Admitting: Family Medicine

## 2023-01-16 DIAGNOSIS — G459 Transient cerebral ischemic attack, unspecified: Secondary | ICD-10-CM

## 2023-01-16 NOTE — Telephone Encounter (Signed)
Requested medication (s) are due for refill today: yes  Requested medication (s) are on the active medication list: yes  Last refill:  10/20/22 #90  Future visit scheduled: no  Notes to clinic:  overdue lab work   Requested Prescriptions  Pending Prescriptions Disp Refills   clopidogrel (PLAVIX) 75 MG tablet [Pharmacy Med Name: CLOPIDOGREL 75 MG TABLET] 90 tablet 0    Sig: TAKE 1 TABLET BY MOUTH EVERY DAY     Hematology: Antiplatelets - clopidogrel Failed - 01/16/2023  9:31 AM      Failed - HCT in normal range and within 180 days    HCT  Date Value Ref Range Status  11/28/2021 38.7 (L) 39.0 - 52.0 % Final   Hematocrit  Date Value Ref Range Status  10/28/2017 39.6 37.5 - 51.0 % Final         Failed - HGB in normal range and within 180 days    Hemoglobin  Date Value Ref Range Status  11/28/2021 12.7 (L) 13.0 - 17.0 g/dL Final  64/40/3474 25.9 13.0 - 17.7 g/dL Final         Failed - PLT in normal range and within 180 days    Platelets  Date Value Ref Range Status  11/28/2021 219 150 - 400 K/uL Final  10/28/2017 248 150 - 450 x10E3/uL Final         Failed - Cr in normal range and within 360 days    Creat  Date Value Ref Range Status  10/11/2021 1.45 (H) 0.70 - 1.22 mg/dL Final   Creatinine, Ser  Date Value Ref Range Status  11/28/2021 1.23 0.61 - 1.24 mg/dL Final         Passed - Valid encounter within last 6 months    Recent Outpatient Visits           5 months ago Gastroesophageal reflux disease with esophagitis without hemorrhage   [place] [facility] Smitty Cords, DO   5 months ago Gastroesophageal reflux disease with esophagitis without hemorrhage   [place] [facility] Smitty Cords, DO   7 months ago Gastroesophageal reflux disease with esophagitis without hemorrhage   [facility] Smitty Cords, DO   8 months ago Pleurisy   [place] [facility] Smitty Cords, DO   11 months ago Gastroesophageal reflux disease with esophagitis without hemorrhage   [place] [facility] [place], Netta Neat, [place]

## 2023-03-02 ENCOUNTER — Other Ambulatory Visit: Payer: Self-pay | Admitting: Student

## 2023-03-02 DIAGNOSIS — R519 Headache, unspecified: Secondary | ICD-10-CM

## 2023-03-20 ENCOUNTER — Ambulatory Visit
Admission: RE | Admit: 2023-03-20 | Discharge: 2023-03-20 | Disposition: A | Discharge status: Home / Self Care | Payer: No Typology Code available for payment source | Source: Ambulatory Visit | Attending: Student | Admitting: Student

## 2023-03-20 DIAGNOSIS — R519 Headache, unspecified: Secondary | ICD-10-CM | POA: Diagnosis present

## 2023-06-27 ENCOUNTER — Other Ambulatory Visit: Payer: Self-pay | Admitting: Family Medicine

## 2023-06-27 DIAGNOSIS — K2100 Gastro-esophageal reflux disease with esophagitis, without bleeding: Secondary | ICD-10-CM

## 2023-07-01 NOTE — Telephone Encounter (Signed)
 Requested medication (s) are due for refill today: yes  Requested medication (s) are on the active medication list: yes  Last refill:  01/05/23 #90  Future visit scheduled: no  Notes to clinic:  per chart pt has been going to [place]   Requested Prescriptions  Pending Prescriptions Disp Refills   pantoprazole  (PROTONIX ) 40 MG tablet [Pharmacy Med Name: PANTOPRAZOLE  SOD DR 40 MG TAB] 90 tablet 0    Sig: TAKE 1 TABLET BY MOUTH EVERY DAY BEFORE BREAKFAST     Gastroenterology: Proton Pump Inhibitors Failed - 07/01/2023  9:11 AM      Failed - Valid encounter within last 12 months    Recent Outpatient Visits   None

## 2023-07-16 ENCOUNTER — Other Ambulatory Visit: Payer: Self-pay | Admitting: Family Medicine

## 2023-07-16 DIAGNOSIS — G459 Transient cerebral ischemic attack, unspecified: Secondary | ICD-10-CM

## 2023-07-17 NOTE — Telephone Encounter (Signed)
 Requested medications are due for refill today.  yes  Requested medications are on the active medications list.  yes  Last refill. 01/16/2023 #90 1 rf  Future visit scheduled.   no  Notes to clinic.  Labs are expired. Pt last seen 08/12/2022.    Requested Prescriptions  Pending Prescriptions Disp Refills   clopidogrel  (PLAVIX ) 75 MG tablet [Pharmacy Med Name: CLOPIDOGREL  75 MG TABLET] 90 tablet 1    Sig: TAKE 1 TABLET BY MOUTH EVERY DAY     Hematology: Antiplatelets - clopidogrel  Failed - 07/17/2023 12:16 PM      Failed - HCT in normal range and within 180 days    HCT  Date Value Ref Range Status  11/28/2021 38.7 (L) 39.0 - 52.0 % Final   Hematocrit  Date Value Ref Range Status  10/28/2017 39.6 37.5 - 51.0 % Final         Failed - HGB in normal range and within 180 days    Hemoglobin  Date Value Ref Range Status  11/28/2021 12.7 (L) 13.0 - 17.0 g/dL Final  16/11/9602 54.0 13.0 - 17.7 g/dL Final         Failed - PLT in normal range and within 180 days    Platelets  Date Value Ref Range Status  11/28/2021 219 150 - 400 K/uL Final  10/28/2017 248 150 - 450 x10E3/uL Final         Failed - Cr in normal range and within 360 days    Creat  Date Value Ref Range Status  10/11/2021 1.45 (H) 0.70 - 1.22 mg/dL Final   Creatinine, Ser  Date Value Ref Range Status  11/28/2021 1.23 0.61 - 1.24 mg/dL Final         Failed - Valid encounter within last 6 months    Recent Outpatient Visits   None

## 2023-08-20 DIAGNOSIS — S3993XA Unspecified injury of pelvis, initial encounter: Secondary | ICD-10-CM | POA: Diagnosis not present

## 2023-08-20 DIAGNOSIS — S40212A Abrasion of left shoulder, initial encounter: Secondary | ICD-10-CM | POA: Diagnosis not present

## 2023-08-20 DIAGNOSIS — S0081XA Abrasion of other part of head, initial encounter: Secondary | ICD-10-CM | POA: Diagnosis not present

## 2023-08-20 DIAGNOSIS — S0990XA Unspecified injury of head, initial encounter: Secondary | ICD-10-CM | POA: Diagnosis not present

## 2023-08-20 DIAGNOSIS — M47812 Spondylosis without myelopathy or radiculopathy, cervical region: Secondary | ICD-10-CM | POA: Diagnosis not present

## 2023-08-20 DIAGNOSIS — S50811A Abrasion of right forearm, initial encounter: Secondary | ICD-10-CM | POA: Diagnosis not present

## 2023-08-20 DIAGNOSIS — W010XXA Fall on same level from slipping, tripping and stumbling without subsequent striking against object, initial encounter: Secondary | ICD-10-CM | POA: Diagnosis not present

## 2023-08-20 DIAGNOSIS — Z7982 Long term (current) use of aspirin: Secondary | ICD-10-CM | POA: Diagnosis not present

## 2023-08-20 DIAGNOSIS — Z87891 Personal history of nicotine dependence: Secondary | ICD-10-CM | POA: Diagnosis not present

## 2023-08-20 DIAGNOSIS — S79912A Unspecified injury of left hip, initial encounter: Secondary | ICD-10-CM | POA: Diagnosis not present

## 2023-08-20 DIAGNOSIS — S3991XA Unspecified injury of abdomen, initial encounter: Secondary | ICD-10-CM | POA: Diagnosis not present

## 2023-08-20 DIAGNOSIS — Y92007 Garden or yard of unspecified non-institutional (private) residence as the place of occurrence of the external cause: Secondary | ICD-10-CM | POA: Diagnosis not present

## 2023-08-20 DIAGNOSIS — S299XXA Unspecified injury of thorax, initial encounter: Secondary | ICD-10-CM | POA: Diagnosis not present

## 2023-08-20 DIAGNOSIS — S199XXA Unspecified injury of neck, initial encounter: Secondary | ICD-10-CM | POA: Diagnosis not present

## 2023-08-20 DIAGNOSIS — Z5181 Encounter for therapeutic drug level monitoring: Secondary | ICD-10-CM | POA: Diagnosis not present

## 2023-08-20 DIAGNOSIS — S3992XA Unspecified injury of lower back, initial encounter: Secondary | ICD-10-CM | POA: Diagnosis not present

## 2023-08-20 DIAGNOSIS — R0789 Other chest pain: Secondary | ICD-10-CM | POA: Diagnosis not present

## 2023-08-20 DIAGNOSIS — M25552 Pain in left hip: Secondary | ICD-10-CM | POA: Diagnosis not present

## 2023-08-20 DIAGNOSIS — Y93H2 Activity, gardening and landscaping: Secondary | ICD-10-CM | POA: Diagnosis not present

## 2023-08-20 DIAGNOSIS — W1781XA Fall down embankment (hill), initial encounter: Secondary | ICD-10-CM | POA: Diagnosis not present

## 2023-08-20 DIAGNOSIS — S50812A Abrasion of left forearm, initial encounter: Secondary | ICD-10-CM | POA: Diagnosis not present

## 2023-08-20 DIAGNOSIS — S43102A Unspecified dislocation of left acromioclavicular joint, initial encounter: Secondary | ICD-10-CM | POA: Diagnosis not present

## 2023-08-20 DIAGNOSIS — J9811 Atelectasis: Secondary | ICD-10-CM | POA: Diagnosis not present

## 2023-08-25 DIAGNOSIS — H905 Unspecified sensorineural hearing loss: Secondary | ICD-10-CM | POA: Diagnosis not present

## 2023-09-03 DIAGNOSIS — Z87891 Personal history of nicotine dependence: Secondary | ICD-10-CM | POA: Diagnosis not present

## 2023-09-03 DIAGNOSIS — M25511 Pain in right shoulder: Secondary | ICD-10-CM | POA: Diagnosis not present

## 2023-09-03 DIAGNOSIS — Z5329 Procedure and treatment not carried out because of patient's decision for other reasons: Secondary | ICD-10-CM | POA: Diagnosis not present

## 2023-09-17 DIAGNOSIS — S43102A Unspecified dislocation of left acromioclavicular joint, initial encounter: Secondary | ICD-10-CM | POA: Diagnosis not present

## 2023-09-17 DIAGNOSIS — Y93H2 Activity, gardening and landscaping: Secondary | ICD-10-CM | POA: Diagnosis not present

## 2023-09-17 DIAGNOSIS — W1781XA Fall down embankment (hill), initial encounter: Secondary | ICD-10-CM | POA: Diagnosis not present

## 2023-09-17 DIAGNOSIS — M19012 Primary osteoarthritis, left shoulder: Secondary | ICD-10-CM | POA: Diagnosis not present

## 2023-09-25 ENCOUNTER — Other Ambulatory Visit: Payer: Self-pay | Admitting: Family Medicine

## 2023-09-25 DIAGNOSIS — K2100 Gastro-esophageal reflux disease with esophagitis, without bleeding: Secondary | ICD-10-CM

## 2023-09-25 NOTE — Telephone Encounter (Signed)
 Requested medication (s) are due for refill today: yes  Requested medication (s) are on the active medication list: yes  Last refill:  07/01/23  Future visit scheduled: no  Notes to clinic:  Unable to refill per protocol, courtesy refill already given, routing for provider approval.      Requested Prescriptions  Pending Prescriptions Disp Refills   pantoprazole  (PROTONIX ) 40 MG tablet [Pharmacy Med Name: PANTOPRAZOLE  SOD DR 40 MG TAB] 90 tablet 0    Sig: TAKE 1 TABLET BY MOUTH EVERY DAY BEFORE BREAKFAST     Gastroenterology: Proton Pump Inhibitors Failed - 09/25/2023  4:05 PM      Failed - Valid encounter within last 12 months    Recent Outpatient Visits   None

## 2023-09-29 DIAGNOSIS — E119 Type 2 diabetes mellitus without complications: Secondary | ICD-10-CM | POA: Diagnosis not present

## 2023-09-29 DIAGNOSIS — K1121 Acute sialoadenitis: Secondary | ICD-10-CM | POA: Diagnosis not present

## 2023-11-18 DIAGNOSIS — S43102D Unspecified dislocation of left acromioclavicular joint, subsequent encounter: Secondary | ICD-10-CM | POA: Diagnosis not present

## 2023-11-18 DIAGNOSIS — M19012 Primary osteoarthritis, left shoulder: Secondary | ICD-10-CM | POA: Diagnosis not present

## 2023-11-18 DIAGNOSIS — W1781XD Fall down embankment (hill), subsequent encounter: Secondary | ICD-10-CM | POA: Diagnosis not present

## 2023-11-24 DIAGNOSIS — H9201 Otalgia, right ear: Secondary | ICD-10-CM | POA: Diagnosis not present

## 2023-11-24 DIAGNOSIS — J300 Vasomotor rhinitis: Secondary | ICD-10-CM | POA: Diagnosis not present

## 2023-12-17 ENCOUNTER — Telehealth: Payer: Self-pay

## 2023-12-17 NOTE — Telephone Encounter (Signed)
 Made pt an appointment for 12/25/23

## 2023-12-20 ENCOUNTER — Other Ambulatory Visit: Payer: Self-pay | Admitting: Family Medicine

## 2023-12-20 DIAGNOSIS — K2100 Gastro-esophageal reflux disease with esophagitis, without bleeding: Secondary | ICD-10-CM

## 2023-12-22 NOTE — Telephone Encounter (Signed)
 Requested Prescriptions  Pending Prescriptions Disp Refills   pantoprazole  (PROTONIX ) 40 MG tablet [Pharmacy Med Name: PANTOPRAZOLE  SOD DR 40 MG TAB] 30 tablet 0    Sig: TAKE 1 TABLET BY MOUTH EVERY DAY BEFORE BREAKFAST     Gastroenterology: Proton Pump Inhibitors Failed - 12/22/2023  1:40 PM      Failed - Valid encounter within last 12 months    Recent Outpatient Visits   None

## 2023-12-25 ENCOUNTER — Ambulatory Visit (INDEPENDENT_AMBULATORY_CARE_PROVIDER_SITE_OTHER)

## 2023-12-25 VITALS — BP 122/58 | Ht 68.0 in | Wt 179.8 lb

## 2023-12-25 DIAGNOSIS — Z0000 Encounter for general adult medical examination without abnormal findings: Secondary | ICD-10-CM

## 2023-12-25 NOTE — Patient Instructions (Addendum)
 Charles York,  Thank you for taking the time for your Medicare Wellness Visit. I appreciate your continued commitment to your health goals. Please review the care plan we discussed, and feel free to reach out if I can assist you further.  Please note that Annual Wellness Visits do not include a physical exam. Some assessments may be limited, especially if the visit was conducted virtually. If needed, we may recommend an in-person follow-up with your provider.  Ongoing Care Seeing your primary care provider every 3 to 6 months helps us  monitor your health and provide consistent, personalized care.   Referrals If a referral was made during today's visit and you haven't received any updates within two weeks, please contact the referred provider directly to check on the status.  Recommended Screenings:  Health Maintenance  Topic Date Due   Complete foot exam   10/19/2022   Hemoglobin A1C  02/12/2023   COVID-19 Vaccine (9 - Pfizer risk 2025-26 season) 05/25/2024   Eye exam for diabetics  10/27/2024   Medicare Annual Wellness Visit  12/24/2024   DTaP/Tdap/Td vaccine (4 - Td or Tdap) 08/08/2027   Pneumococcal Vaccine for age over 58  Completed   Flu Shot  Completed   Zoster (Shingles) Vaccine  Completed   Meningitis B Vaccine  Aged Out     Vision: Annual vision screenings are recommended for early detection of glaucoma, cataracts, and diabetic retinopathy. These exams can also reveal signs of chronic conditions such as diabetes and high blood pressure.  Dental: Annual dental screenings help detect early signs of oral cancer, gum disease, and other conditions linked to overall health, including heart disease and diabetes.  Please see the attached documents for additional preventive care recommendations.    NEXT AWV 12/28/24 @ 2:40 PM IN PERSON

## 2023-12-25 NOTE — Progress Notes (Signed)
 No chief complaint on file.    Subjective:   Charles York is a 89 y.o. male who presents for a Medicare Annual Wellness Visit.  Allergies (verified) Trulicity  [dulaglutide ], Buspirone , Metformin, Empagliflozin, Felodipine, Ibuprofen, and Penicillins   History: Past Medical History:  Diagnosis Date   Anxiety    Arthritis    Cancer (HCC)    skin   Chronic kidney disease    stage 3   Colon polyp    Diabetes mellitus without complication (HCC)    GERD (gastroesophageal reflux disease)    Heartburn    Hyperlipemia    Hypertension    Obesity    Stroke [facility])    Past Surgical History:  Procedure Laterality Date   CHOLECYSTECTOMY N/A 02/04/2018   Procedure: LAPAROSCOPIC CHOLECYSTECTOMY;  Surgeon: Gladis Cough, MD;  Location: [facility] ORS;  Service: General;  Laterality: N/A;   EYE SURGERY  11/2017   fluid in back of eye/ injection every 3 months. 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, environmental    Comment: retired  Tobacco Use   Smoking status: Former    Current packs/day: 0.00    Average packs/day: 0.3 packs/day for 30.0 years (7.5 ttl pk-yrs)    Types: Cigarettes    Start date: 02/03/1953    Quit date: 02/04/1983    Years since quitting: 40.9   Smokeless tobacco: Former  Building Services Engineer status: Never Used  Substance and Sexual Activity   Alcohol  use: No    Alcohol /week: 0.0 standard drinks of alcohol    Drug use: No   Sexual activity: Not on file   Tobacco Counseling Counseling given: Not Answered  SDOH Screenings   Food Insecurity: No Food Insecurity (09/10/2023)   Received from [facility]  Housing: Low Risk  (09/10/2023)   Received from [facility]  Transportation Needs: No Transportation Needs (09/10/2023)   Received  from [facility]  Utilities: Not At Risk (09/10/2023)   Received from [facility]  Alcohol  Screen: Low Risk  (09/16/2021)  Depression (PHQ2-9): Low Risk  (08/12/2022)  Financial Resource Strain: Low Risk  (09/10/2023)   Received from [facility]  Physical Activity: Sufficiently Active (09/16/2021)  Social Connections: Socially Isolated (09/16/2021)  Stress: No Stress Concern Present (09/16/2021)  Tobacco Use: Medium Risk (08/20/2023)   Received from [facility]   See flowsheets for full screening details  Depression Screen     Goals Addressed   None    No data recorded      Objective:    There were no vitals filed for this visit. There is no height or weight on file to calculate BMI.  Current Medications (verified) Outpatient Encounter Medications as of 12/25/2023  Medication Sig   acetaminophen  (TYLENOL ) 325 MG tablet Take by mouth.   aspirin  81 MG chewable tablet CHEW ONE TABLET BY MOUTH EVERY DAY FOR ACUTE SYNDROME OF THE HEART ASK YOUR DOCTOR HOW LONG YOU SHOULD TAKE THIS MEDICATION   atorvastatin  (LIPITOR) 40 MG tablet Take 1 tablet (40 mg total) by mouth at bedtime.   Cholecalciferol 25 MCG (1000 UT) tablet TAKE TWO TABLETS BY MOUTH EVERY DAY AS NEEDED  clopidogrel  (PLAVIX ) 75 MG tablet TAKE 1 TABLET BY MOUTH EVERY DAY   ferrous sulfate  325 (65 FE) MG tablet Take 325 mg by mouth every other day.   GVOKE HYPOPEN  2-PACK 1 MG/0.2ML SOAJ Inject 1 mg into the skin as needed (hypoglycemia).   hydroxypropyl methylcellulose / hypromellose (ISOPTO TEARS / GONIOVISC) 2.5 % ophthalmic solution Place 1 drop into both eyes 4 (four) times daily.   insulin  aspart (NOVOLOG ) 100 UNIT/ML injection Inject 12 Units into the skin in the morning and at bedtime. Inject 12 units in the morning, Inject 12 units in the evening, TAKE BEFORE MEALS   insulin  glargine (LANTUS ) 100 UNIT/ML injection Inject into the skin.   losartan   (COZAAR ) 25 MG tablet Take 12.5 mg by mouth daily.   nitroGLYCERIN (NITROSTAT) 0.4 MG SL tablet Place under the tongue.   pantoprazole  (PROTONIX ) 40 MG tablet TAKE 1 TABLET BY MOUTH EVERY DAY BEFORE BREAKFAST   prednisoLONE acetate (PRED FORTE) 1 % ophthalmic suspension INSTILL 2 DROPS IN [facility] EYE FOUR TIMES A DAY   No facility-administered encounter medications on file as of 12/25/2023.   Hearing/Vision screen No results found. Immunizations and Health Maintenance Health Maintenance  Topic Date Due   Medicare Annual Wellness (AWV)  09/17/2022   FOOT EXAM  10/19/2022   HEMOGLOBIN A1C  02/12/2023   COVID-19 Vaccine (9 - Pfizer risk 2025-26 season) 05/25/2024   OPHTHALMOLOGY EXAM  10/27/2024   DTaP/Tdap/Td (4 - Td or Tdap) 08/08/2027   Pneumococcal Vaccine: 50+ Years  Completed   Influenza Vaccine  Completed   Zoster Vaccines- Shingrix  Completed   Meningococcal B Vaccine  Aged Out        Assessment/Plan:  This is a routine wellness examination for Charles York.  Patient Care Team: Edman Marsa PARAS, DO as PCP - General (Family Medicine) Ezzard Rolin BIRCH, LCSW as Social Worker (Licensed Clinical Social Worker) Samuella Twyla HERO, PA-C (Gastroenterology)  I have personally reviewed and noted the following in the patient's chart:   Medical and social history Use of alcohol , tobacco or illicit drugs  Current medications and supplements including opioid prescriptions. Functional ability and status Nutritional status Physical activity Advanced directives List of other physicians Hospitalizations, surgeries, and ER visits in previous 12 months Vitals Screenings to include cognitive, depression, and falls Referrals and appointments  No orders of the defined types were placed in this encounter.  In addition, I have reviewed and discussed with patient certain preventive protocols, quality metrics, and best practice recommendations. A written personalized care plan for  preventive services as well as general preventive health recommendations were provided to patient.   Jhonnie GORMAN Das, LPN   [id number]   No follow-ups on file.  After Visit Summary: (In Person-Declined) Patient declined AVS at this time.  Nurse Notes: AGED OUT OF COLONOSCOPY; UTD ON SHOTS

## 2024-01-26 ENCOUNTER — Other Ambulatory Visit: Payer: Self-pay | Admitting: Family Medicine

## 2024-01-26 DIAGNOSIS — K2100 Gastro-esophageal reflux disease with esophagitis, without bleeding: Secondary | ICD-10-CM

## 2024-01-27 NOTE — Telephone Encounter (Signed)
 Requested by interface surescripts. Courtesy refill. Future visit 02/22/24 Requested Prescriptions  Pending Prescriptions Disp Refills   pantoprazole  (PROTONIX ) 40 MG tablet [Pharmacy Med Name: PANTOPRAZOLE  SOD DR 40 MG TAB] 30 tablet 0    Sig: TAKE 1 TABLET BY MOUTH EVERY DAY BEFORE BREAKFAST     Gastroenterology: Proton Pump Inhibitors Passed - 01/27/2024  3:14 PM      Passed - Valid encounter within last 12 months    Recent Outpatient Visits   None

## 2024-02-22 ENCOUNTER — Ambulatory Visit (INDEPENDENT_AMBULATORY_CARE_PROVIDER_SITE_OTHER): Admitting: Family Medicine

## 2024-02-22 ENCOUNTER — Encounter: Payer: Self-pay | Admitting: Family Medicine

## 2024-02-22 VITALS — BP 122/58 | HR 76 | Ht 68.0 in | Wt 183.2 lb

## 2024-02-22 DIAGNOSIS — I129 Hypertensive chronic kidney disease with stage 1 through stage 4 chronic kidney disease, or unspecified chronic kidney disease: Secondary | ICD-10-CM

## 2024-02-22 DIAGNOSIS — Z0000 Encounter for general adult medical examination without abnormal findings: Secondary | ICD-10-CM

## 2024-02-22 DIAGNOSIS — N183 Chronic kidney disease, stage 3 unspecified: Secondary | ICD-10-CM | POA: Diagnosis not present

## 2024-02-22 DIAGNOSIS — E11319 Type 2 diabetes mellitus with unspecified diabetic retinopathy without macular edema: Secondary | ICD-10-CM | POA: Diagnosis not present

## 2024-02-22 DIAGNOSIS — E1140 Type 2 diabetes mellitus with diabetic neuropathy, unspecified: Secondary | ICD-10-CM | POA: Diagnosis not present

## 2024-02-22 DIAGNOSIS — K2100 Gastro-esophageal reflux disease with esophagitis, without bleeding: Secondary | ICD-10-CM

## 2024-02-22 DIAGNOSIS — E785 Hyperlipidemia, unspecified: Secondary | ICD-10-CM | POA: Diagnosis not present

## 2024-02-22 DIAGNOSIS — H9311 Tinnitus, right ear: Secondary | ICD-10-CM

## 2024-02-22 DIAGNOSIS — F411 Generalized anxiety disorder: Secondary | ICD-10-CM | POA: Diagnosis not present

## 2024-02-22 DIAGNOSIS — Z794 Long term (current) use of insulin: Secondary | ICD-10-CM | POA: Diagnosis not present

## 2024-02-22 DIAGNOSIS — N1831 Chronic kidney disease, stage 3a: Secondary | ICD-10-CM | POA: Diagnosis not present

## 2024-02-22 DIAGNOSIS — E1169 Type 2 diabetes mellitus with other specified complication: Secondary | ICD-10-CM

## 2024-02-22 DIAGNOSIS — F3342 Major depressive disorder, recurrent, in full remission: Secondary | ICD-10-CM

## 2024-02-22 DIAGNOSIS — H9113 Presbycusis, bilateral: Secondary | ICD-10-CM

## 2024-02-22 MED ORDER — PANTOPRAZOLE SODIUM 40 MG PO TBEC: 40.0000 mg | DELAYED_RELEASE_TABLET | Freq: Every day | ORAL | 3 refills | Status: AC

## 2024-02-22 MED ORDER — SUCRALFATE 1 G PO TABS: 1.0000 g | ORAL_TABLET | Freq: Three times a day (TID) | ORAL | 2 refills | Status: AC

## 2024-02-22 NOTE — Patient Instructions (Addendum)
 Thank you for coming to the office today.  Refilled medication including Sucralfate , take as needed  Keep on Pantoprazole   Updated medication list.  Labs today  Please schedule a Follow-up Appointment to: Return for 6 month DM A1c.  If you have any other questions or concerns, please feel free to call the office or send a message through MyChart. You may also schedule an earlier appointment if necessary.  Additionally, you may be receiving a survey about your experience at our office within a few days to 1 week by e-mail or mail. We value your feedback.  Marsa Officer, DO [facility], [place]

## 2024-02-22 NOTE — Progress Notes (Unsigned)
 "  Subjective:    Patient ID: Charles York, male    DOB: Mar 11, 1930, 89 y.o.   MRN: [id number]  Charles York is a 89 y.o. male presenting on 02/22/2024 for Annual Exam   HPI  Discussed the use of AI scribe software for clinical note transcription with the patient, who gave verbal consent to proceed.  History of Present Illness    Follow-up GERD PUD with radiation of symptoms   Interval update, he has completed Upper GI series barium swallow, see results below, with identified mod to severe esophageal dysmotility and severe gastroesophageal reflux   He has continued on Pantoprazole  40mg  twice a day with relief. He has stopped Carafate  since ineffective.   Improved with smoothie addition to diet every other day not causing or provoking any reflux.   He does not need a new refill today   He has upcoming apt [facility] Jonette Primmer PA in Sept 2024, we will inquire if can be seen sooner.   He continues to take Aspirin  and Plavix .   Continues to do well and eating drinking without difficulty now but still concerned with episodic flares with GERD   CHRONIC DM, Type 2: Reports no concerns Prior A1c 7 range CBGs: Avg 150 and under Meds: Lantus  25 units daily and Novolog  mix 12 u BID Reports  good compliance. Tolerating well w/o side-effects No longer on ARB Lifestyle: - Diet (improved)  Due DM Eye Exam Admits some hypoglycemia CBG 60s, not feeling hypoglycemia  HYPERLIPIDEMIA: - Reports ***no concerns. Last lipid panel ***, ***controlled ***abnormal, fasting today for lipid panel check - Currently taking ***, tolerating well without side effects or myalgias Lifestyle - Diet: *** - Exercise: ***   Health Maintenance: ***     02/22/2024    9:28 AM 12/25/2023    2:54 PM 08/12/2022    1:39 PM  Depression screen PHQ 2/9  Decreased Interest 0 0 0  Down, Depressed, Hopeless 0 0 0  PHQ - 2 Score 0 0 0  Altered sleeping 0 0 0  Tired, decreased energy 0 0 2  Change in  appetite 0 0 2  Feeling bad or failure about yourself  0 0 0  Trouble concentrating 0 0 0  Moving slowly or fidgety/restless 0 0 0  Suicidal thoughts 0 0 0  PHQ-9 Score 0 0 4   Difficult doing work/chores  Not difficult at all Not difficult at all     Data saved with a previous flowsheet row definition       02/22/2024    9:28 AM 10/18/2021    9:47 AM 07/16/2021    8:20 AM 03/20/2021    8:11 AM  GAD 7 : Generalized Anxiety Score  Nervous, Anxious, on Edge 0 0 0 3  Control/stop worrying 0 0 0 3  Worry too much - different things 0 0 0 3  Trouble relaxing 0 0 0 3  Restless 0 0 0 1  Easily annoyed or irritable 0 0 0 3  Afraid - awful might happen 0 0 0 3  Total GAD 7 Score 0 0 0 19  Anxiety Difficulty  Not difficult at all Not difficult at all Somewhat difficult     Past Medical History:  Diagnosis Date   Anxiety    Arthritis    Cancer (HCC)    skin   Chronic kidney disease    stage 3   Colon polyp    Diabetes mellitus without complication (HCC)  GERD (gastroesophageal reflux disease)    Heartburn    Hyperlipemia    Hypertension    Obesity    Stroke [facility])    Past Surgical History:  Procedure Laterality Date   CHOLECYSTECTOMY N/A 02/04/2018   Procedure: LAPAROSCOPIC CHOLECYSTECTOMY;  Surgeon: Gladis Cough, MD;  Location: [facility] ORS;  Service: General;  Laterality: N/A;   EYE SURGERY  11/2017   fluid in back of eye/ injection every 3 months. 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, environmental    Comment: retired  Tobacco Use   Smoking status: Former    Current packs/day: 0.00    Average packs/day: 0.3 packs/day for 30.0 years (7.5 ttl pk-yrs)    Types: Cigarettes    Start date: 02/03/1953     Quit date: 02/04/1983    Years since quitting: 41.0   Smokeless tobacco: Former  Building Services Engineer status: Never Used  Substance and Sexual Activity   Alcohol  use: No    Alcohol /week: 0.0 standard drinks of alcohol    Drug use: No   Sexual activity: Not on file  Other Topics Concern   Not on file  Social History Narrative   Not on file   Social Drivers of Health   Tobacco Use: Medium Risk (02/22/2024)   Patient History    Smoking Tobacco Use: Former    Smokeless Tobacco Use: Former    Passive Exposure: Not on Actuary Strain: Low Risk  (09/10/2023)   Received from [facility]   Overall Financial Resource Strain (CARDIA)    Difficulty of Paying Living Expenses: Not hard at all  Food Insecurity: No Food Insecurity (12/25/2023)   Epic    Worried About Radiation Protection Practitioner of Food in the Last Year: Never true    Ran Out of Food in the Last Year: Never true  Transportation Needs: No Transportation Needs (12/25/2023)   Epic    Lack of Transportation (Medical): No    Lack of Transportation (Non-Medical): No  Physical Activity: Insufficiently Active (12/25/2023)   Exercise Vital Sign    Days of Exercise per Week: 7 days    Minutes of Exercise per Session: 20 min  Stress: No Stress Concern Present (12/25/2023)   [employer] of Occupational Health - Occupational Stress Questionnaire    Feeling of Stress: Not at all  Social Connections: Moderately Isolated (12/25/2023)   Social Connection and Isolation Panel    Frequency of Communication with Friends and Family: Twice a week    Frequency of Social Gatherings with Friends and Family: More than three times a week    Attends Religious Services: Never    Database Administrator or Organizations: Yes    Attends Banker Meetings: 1 to 4 times per year    Marital Status: Widowed  Intimate Partner Violence: Not At Risk (12/25/2023)   Epic    Fear of Current or Ex-Partner: No    Emotionally  Abused: No    Physically Abused: No    Sexually Abused: No  Depression (PHQ2-9): Low Risk (02/22/2024)   Depression (PHQ2-9)    PHQ-2 Score: 0  Alcohol  Screen: Low Risk (  09/16/2021)   Alcohol  Screen    Last Alcohol  Screening Score (AUDIT): 0  Housing: Unknown (12/25/2023)   Epic    Unable to Pay for Housing in the Last Year: No    Number of Times Moved in the Last Year: Not on file    Homeless in the Last Year: No  Utilities: Not At Risk (12/25/2023)   Epic    Threatened with loss of utilities: No  Health Literacy: Adequate Health Literacy (12/25/2023)   B1300 Health Literacy    Frequency of need for help with medical instructions: Never   Family History  Problem Relation Age of Onset   Cancer Father        carcinoma   Alzheimer's disease Mother    Current Outpatient Medications on File Prior to Visit  Medication Sig   acetaminophen  (TYLENOL ) 325 MG tablet Take by mouth.   aspirin  81 MG chewable tablet CHEW ONE TABLET BY MOUTH EVERY DAY FOR ACUTE SYNDROME OF THE HEART ASK YOUR DOCTOR HOW LONG YOU SHOULD TAKE THIS MEDICATION   atorvastatin  (LIPITOR) 40 MG tablet Take 1 tablet (40 mg total) by mouth at bedtime.   ferrous sulfate  325 (65 FE) MG tablet Take 325 mg by mouth every other day.   GVOKE HYPOPEN  2-PACK 1 MG/0.2ML SOAJ Inject 1 mg into the skin as needed (hypoglycemia).   hydroxypropyl methylcellulose / hypromellose (ISOPTO TEARS / GONIOVISC) 2.5 % ophthalmic solution Place 1 drop into both eyes 4 (four) times daily.   insulin  glargine (LANTUS ) 100 UNIT/ML injection Inject 30 Units into the skin daily with breakfast.   nitroGLYCERIN (NITROSTAT) 0.4 MG SL tablet Place under the tongue.   prednisoLONE acetate (PRED FORTE) 1 % ophthalmic suspension INSTILL 2 DROPS IN EACH EYE FOUR TIMES A DAY   losartan  (COZAAR ) 25 MG tablet Take 12.5 mg by mouth daily. (Patient not taking: Reported on 02/22/2024)   No current facility-administered medications on file prior to visit.     Review of Systems Per HPI unless specifically indicated above     Objective:    BP (!) 122/58 (BP Location: Left Arm, Patient Position: Sitting, Cuff Size: Normal)   Pulse 76   Ht 5' 8 (1.727 m)   Wt 183 lb 4 oz (83.1 kg)   SpO2 95%   BMI 27.86 kg/m   Wt Readings from Last 3 Encounters:  02/22/24 183 lb 4 oz (83.1 kg)  12/25/23 179 lb 12.8 oz (81.6 kg)  08/12/22 171 lb 6.4 oz (77.7 kg)    Physical Exam  Diabetic Foot Exam - Simple   Simple Foot Form Diabetic Foot exam was performed with the following findings: Yes 02/22/2024 10:05 AM  Visual Inspection See comments: Yes Sensation Testing Intact to touch and monofilament testing bilaterally: Yes Pulse Check Posterior Tibialis and Dorsalis pulse intact bilaterally: Yes Comments Bilateral bunion and overlapping great toe 2nd toe with spacers in place, some mild callus formation lateral aspect great toes and heel. Dry skin only mild. No ulceration. Intact monofilament.      Results for orders placed or performed in visit on 08/12/22  POCT glycosylated hemoglobin (Hb A1C)   Collection Time: 08/12/22  2:36 PM  Result Value Ref Range   Hemoglobin A1C 8.1 (A) 4.0 - 5.6 %      Assessment & Plan:   Problem List Items Addressed This Visit     Benign hypertension with CKD (chronic kidney disease) stage III (HCC)   Relevant Orders   CBC with Differential/Platelet  Microalbumin / creatinine urine ratio   Comprehensive metabolic panel with GFR   CKD (chronic kidney disease), stage III (HCC)   Relevant Orders   CBC with Differential/Platelet   Comprehensive metabolic panel with GFR   Diabetic retinopathy (HCC)   GAD (generalized anxiety disorder)   GERD (gastroesophageal reflux disease)   Relevant Medications   sucralfate  (CARAFATE ) 1 g tablet   pantoprazole  (PROTONIX ) 40 MG tablet   Hyperlipidemia associated with type 2 diabetes mellitus (HCC)   Relevant Orders   Lipid panel   TSH   Comprehensive metabolic  panel with GFR   Major depression, recurrent, full remission   Presbycusis of both ears   Tinnitus of right ear   Type 2 diabetes, controlled, with neuropathy (HCC)   Relevant Orders   Hemoglobin A1c   Microalbumin / creatinine urine ratio   Other Visit Diagnoses       Annual physical exam    -  Primary   Relevant Orders   Lipid panel   Hemoglobin A1c   CBC with Differential/Platelet   PSA   Microalbumin / creatinine urine ratio   TSH   Comprehensive metabolic panel with GFR        Updated Health Maintenance information ***- Reviewed recent lab results with patient Encouraged improvement to lifestyle with diet and exercise -*** Goal of weight loss  Assessment and Plan Assessment & Plan      Orders Placed This Encounter  Procedures   Lipid panel    Has the patient fasted?:   Yes   Hemoglobin A1c   CBC with Differential/Platelet   PSA   Microalbumin / creatinine urine ratio   TSH   Comprehensive metabolic panel with GFR    Has the patient fasted?:   Yes    Meds ordered this encounter  Medications   sucralfate  (CARAFATE ) 1 g tablet    Sig: Take 1 tablet (1 g total) by mouth 3 (three) times daily with meals. As needed for indigestion    Dispense:  90 tablet    Refill:  2   pantoprazole  (PROTONIX ) 40 MG tablet    Sig: Take 1 tablet (40 mg total) by mouth daily before breakfast.    Dispense:  90 tablet    Refill:  3     Follow up plan: Return for 6 month DM A1c.  Marsa Officer, DO [facility] 02/22/2024, 9:49 AM  "

## 2024-02-23 LAB — HEMOGLOBIN A1C
Hgb A1c MFr Bld: 8.4 % — ABNORMAL HIGH
Mean Plasma Glucose: 194 mg/dL
eAG (mmol/L): 10.8 mmol/L

## 2024-02-23 LAB — MICROALBUMIN / CREATININE URINE RATIO
Creatinine, Urine: 92 mg/dL (ref 20–320)
Microalb Creat Ratio: 16 mg/g{creat}
Microalb, Ur: 1.5 mg/dL

## 2024-02-23 LAB — CBC WITH DIFFERENTIAL/PLATELET
Absolute Lymphocytes: 2303 {cells}/uL (ref 850–3900)
Absolute Monocytes: 585 {cells}/uL (ref 200–950)
Basophils Absolute: 53 {cells}/uL (ref 0–200)
Basophils Relative: 0.7 %
Eosinophils Absolute: 218 {cells}/uL (ref 15–500)
Eosinophils Relative: 2.9 %
HCT: 36.5 % — ABNORMAL LOW (ref 39.4–51.1)
Hemoglobin: 12.2 g/dL — ABNORMAL LOW (ref 13.2–17.1)
MCH: 32.6 pg (ref 27.0–33.0)
MCHC: 33.4 g/dL (ref 31.6–35.4)
MCV: 97.6 fL (ref 81.4–101.7)
MPV: 12.7 fL — ABNORMAL HIGH (ref 7.5–12.5)
Monocytes Relative: 7.8 %
Neutro Abs: 4343 {cells}/uL (ref 1500–7800)
Neutrophils Relative %: 57.9 %
Platelets: 231 Thousand/uL (ref 140–400)
RBC: 3.74 Million/uL — ABNORMAL LOW (ref 4.20–5.80)
RDW: 12.1 % (ref 11.0–15.0)
Total Lymphocyte: 30.7 %
WBC: 7.5 Thousand/uL (ref 3.8–10.8)

## 2024-02-23 LAB — COMPREHENSIVE METABOLIC PANEL WITH GFR
AG Ratio: 1.5 (calc) (ref 1.0–2.5)
ALT: 16 U/L (ref 9–46)
AST: 13 U/L (ref 10–35)
Albumin: 4.1 g/dL (ref 3.6–5.1)
Alkaline phosphatase (APISO): 94 U/L (ref 35–144)
BUN/Creatinine Ratio: 25 (calc) — ABNORMAL HIGH (ref 6–22)
BUN: 31 mg/dL — ABNORMAL HIGH (ref 7–25)
CO2: 27 mmol/L (ref 20–32)
Calcium: 9.2 mg/dL (ref 8.6–10.3)
Chloride: 104 mmol/L (ref 98–110)
Creat: 1.22 mg/dL (ref 0.70–1.22)
Globulin: 2.7 g/dL (ref 1.9–3.7)
Glucose, Bld: 180 mg/dL — ABNORMAL HIGH (ref 65–99)
Potassium: 4.9 mmol/L (ref 3.5–5.3)
Sodium: 139 mmol/L (ref 135–146)
Total Bilirubin: 0.4 mg/dL (ref 0.2–1.2)
Total Protein: 6.8 g/dL (ref 6.1–8.1)
eGFR: 56 mL/min/1.73m2 — ABNORMAL LOW

## 2024-02-23 LAB — LIPID PANEL
Cholesterol: 117 mg/dL
HDL: 49 mg/dL
LDL Cholesterol (Calc): 51 mg/dL
Non-HDL Cholesterol (Calc): 68 mg/dL
Total CHOL/HDL Ratio: 2.4 (calc)
Triglycerides: 88 mg/dL

## 2024-02-23 LAB — PSA: PSA: 0.38 ng/mL

## 2024-02-23 LAB — TSH: TSH: 2.68 m[IU]/L (ref 0.40–4.50)

## 2024-02-26 ENCOUNTER — Ambulatory Visit: Payer: Self-pay | Admitting: Family Medicine

## 2024-03-01 ENCOUNTER — Other Ambulatory Visit: Payer: Self-pay | Admitting: Family Medicine

## 2024-08-23 ENCOUNTER — Ambulatory Visit: Admitting: Family Medicine

## 2024-12-28 ENCOUNTER — Ambulatory Visit
